# Patient Record
Sex: Male | Born: 1955 | Race: White | Hispanic: No | Marital: Married | Smoking: Never smoker
Health system: Southern US, Community
[De-identification: ages and names within clinical notes are randomized; demographics above are authoritative.]

## PROBLEM LIST (undated history)

## (undated) DIAGNOSIS — N200 Calculus of kidney: Secondary | ICD-10-CM

## (undated) DIAGNOSIS — Z950 Presence of cardiac pacemaker: Secondary | ICD-10-CM

## (undated) DIAGNOSIS — E785 Hyperlipidemia, unspecified: Secondary | ICD-10-CM

## (undated) DIAGNOSIS — I4892 Unspecified atrial flutter: Secondary | ICD-10-CM

## (undated) DIAGNOSIS — I2510 Atherosclerotic heart disease of native coronary artery without angina pectoris: Secondary | ICD-10-CM

## (undated) DIAGNOSIS — K219 Gastro-esophageal reflux disease without esophagitis: Secondary | ICD-10-CM

## (undated) DIAGNOSIS — I4891 Unspecified atrial fibrillation: Secondary | ICD-10-CM

## (undated) DIAGNOSIS — IMO0002 Reserved for concepts with insufficient information to code with codable children: Secondary | ICD-10-CM

## (undated) DIAGNOSIS — D689 Coagulation defect, unspecified: Secondary | ICD-10-CM

## (undated) DIAGNOSIS — F419 Anxiety disorder, unspecified: Secondary | ICD-10-CM

## (undated) DIAGNOSIS — I495 Sick sinus syndrome: Secondary | ICD-10-CM

## (undated) DIAGNOSIS — F329 Major depressive disorder, single episode, unspecified: Secondary | ICD-10-CM

## (undated) DIAGNOSIS — Q670 Congenital facial asymmetry: Secondary | ICD-10-CM

## (undated) DIAGNOSIS — Z955 Presence of coronary angioplasty implant and graft: Secondary | ICD-10-CM

## (undated) DIAGNOSIS — I10 Essential (primary) hypertension: Secondary | ICD-10-CM

## (undated) DIAGNOSIS — R011 Cardiac murmur, unspecified: Secondary | ICD-10-CM

## (undated) DIAGNOSIS — I499 Cardiac arrhythmia, unspecified: Secondary | ICD-10-CM

## (undated) DIAGNOSIS — F32A Depression, unspecified: Secondary | ICD-10-CM

## (undated) HISTORY — PX: CYSTOSCOPY: SUR368

## (undated) HISTORY — DX: Hyperlipidemia, unspecified: E78.5

## (undated) HISTORY — PX: ATRIAL FIBRILLATION ABLATION: SHX5732

## (undated) HISTORY — DX: Unspecified atrial flutter: I48.92

## (undated) HISTORY — DX: Cardiac murmur, unspecified: R01.1

## (undated) HISTORY — DX: Sick sinus syndrome: I49.5

## (undated) HISTORY — DX: Reserved for concepts with insufficient information to code with codable children: IMO0002

---

## 1998-09-26 ENCOUNTER — Ambulatory Visit (HOSPITAL_BASED_OUTPATIENT_CLINIC_OR_DEPARTMENT_OTHER): Admission: RE | Admit: 1998-09-26 | Discharge: 1998-09-26 | Payer: Self-pay

## 1998-10-05 ENCOUNTER — Encounter: Admission: RE | Admit: 1998-10-05 | Discharge: 1999-01-03 | Payer: Self-pay

## 2008-09-17 DIAGNOSIS — Q670 Congenital facial asymmetry: Secondary | ICD-10-CM

## 2008-09-17 HISTORY — DX: Congenital facial asymmetry: Q67.0

## 2008-09-17 HISTORY — PX: CORONARY ARTERY BYPASS GRAFT: SHX141

## 2009-03-11 ENCOUNTER — Inpatient Hospital Stay (HOSPITAL_COMMUNITY): Admission: EM | Admit: 2009-03-11 | Discharge: 2009-03-18 | Payer: Self-pay | Admitting: Emergency Medicine

## 2009-03-11 ENCOUNTER — Ambulatory Visit: Payer: Self-pay | Admitting: Thoracic Surgery (Cardiothoracic Vascular Surgery)

## 2009-03-11 ENCOUNTER — Encounter: Payer: Self-pay | Admitting: Thoracic Surgery (Cardiothoracic Vascular Surgery)

## 2009-03-27 ENCOUNTER — Emergency Department (HOSPITAL_COMMUNITY): Admission: EM | Admit: 2009-03-27 | Discharge: 2009-03-27 | Payer: Self-pay | Admitting: Emergency Medicine

## 2009-04-07 ENCOUNTER — Ambulatory Visit: Payer: Self-pay | Admitting: Thoracic Surgery (Cardiothoracic Vascular Surgery)

## 2009-04-07 ENCOUNTER — Encounter (HOSPITAL_COMMUNITY): Admission: RE | Admit: 2009-04-07 | Discharge: 2009-07-06 | Payer: Self-pay | Admitting: Cardiology

## 2009-04-07 ENCOUNTER — Encounter
Admission: RE | Admit: 2009-04-07 | Discharge: 2009-04-07 | Payer: Self-pay | Admitting: Thoracic Surgery (Cardiothoracic Vascular Surgery)

## 2010-06-02 ENCOUNTER — Encounter: Admission: RE | Admit: 2010-06-02 | Discharge: 2010-06-02 | Payer: Self-pay | Admitting: Cardiology

## 2010-06-06 ENCOUNTER — Ambulatory Visit (HOSPITAL_COMMUNITY): Admission: RE | Admit: 2010-06-06 | Discharge: 2010-06-06 | Payer: Self-pay | Admitting: Cardiology

## 2010-10-18 DIAGNOSIS — I495 Sick sinus syndrome: Secondary | ICD-10-CM

## 2010-10-18 HISTORY — DX: Sick sinus syndrome: I49.5

## 2010-10-18 HISTORY — PX: INSERT / REPLACE / REMOVE PACEMAKER: SUR710

## 2010-11-10 ENCOUNTER — Other Ambulatory Visit: Payer: Self-pay | Admitting: Cardiology

## 2010-11-10 ENCOUNTER — Ambulatory Visit
Admission: RE | Admit: 2010-11-10 | Discharge: 2010-11-10 | Disposition: A | Discharge status: Home / Self Care | Payer: BC Managed Care – PPO | Source: Ambulatory Visit | Attending: Cardiology | Admitting: Cardiology

## 2010-11-15 ENCOUNTER — Ambulatory Visit (HOSPITAL_COMMUNITY)
Admission: RE | Admit: 2010-11-15 | Discharge: 2010-11-16 | Disposition: A | Discharge status: Home / Self Care | Payer: BC Managed Care – PPO | Source: Ambulatory Visit | Attending: Cardiology | Admitting: Cardiology

## 2010-11-15 DIAGNOSIS — I10 Essential (primary) hypertension: Secondary | ICD-10-CM | POA: Insufficient documentation

## 2010-11-15 DIAGNOSIS — I495 Sick sinus syndrome: Secondary | ICD-10-CM | POA: Insufficient documentation

## 2010-11-15 DIAGNOSIS — I2510 Atherosclerotic heart disease of native coronary artery without angina pectoris: Secondary | ICD-10-CM | POA: Insufficient documentation

## 2010-11-15 DIAGNOSIS — R413 Other amnesia: Secondary | ICD-10-CM | POA: Insufficient documentation

## 2010-11-15 DIAGNOSIS — Z951 Presence of aortocoronary bypass graft: Secondary | ICD-10-CM | POA: Insufficient documentation

## 2010-11-15 DIAGNOSIS — E785 Hyperlipidemia, unspecified: Secondary | ICD-10-CM | POA: Insufficient documentation

## 2010-11-15 LAB — APTT: aPTT: 119 seconds — ABNORMAL HIGH (ref 24–37)

## 2010-11-15 LAB — SURGICAL PCR SCREEN: MRSA, PCR: NEGATIVE

## 2010-11-15 LAB — PROTIME-INR: INR: 1.12 (ref 0.00–1.49)

## 2010-11-16 ENCOUNTER — Inpatient Hospital Stay (HOSPITAL_COMMUNITY): Payer: BC Managed Care – PPO

## 2010-11-28 NOTE — Discharge Summary (Signed)
Lucas Porter, Lucas Porter NO.:  [id number]  MEDICAL RECORD NO.:  [id number]           PATIENT TYPE:  O  LOCATION:  3706                         FACILITY:  [facility]  PHYSICIAN:  Ritta Slot, MD     DATE OF BIRTH:  [birth year]  DATE OF ADMISSION:  11/15/2010 DATE OF DISCHARGE:  11/16/2010                              DISCHARGE SUMMARY   DISCHARGE DIAGNOSES: 1. 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     a.     Explantation of loop recorder.     b.     Implantation of a dual-chamber Medtronic Reveal XT      pacemaker. 2. Coronary disease with history of bypass grafting in June 2010. 3. Memory loss. 4. Dyslipidemia.  DISCHARGE CONDITION:  Stable.  DISCHARGE INSTRUCTIONS: 1. See pacemaker instruction sheet. 2. Return to work once Dr. Lynnea Ferrier clears. 3. Low-sodium, heart-healthy diet. 4. Follow up with Dr. Lynnea Ferrier on November 22, 2010, at 9:45 a.m. 5. Coumadin Clinic through [facility] on November 22, 2010, time will be determined of appointment. 6. Begin Coumadin on November 17, 2010.  He will continue same dose.  DISCHARGE MEDICATIONS:  See medication reconciliation sheet.  We did add Vicodin on a p.r.n. basis for site pain in case he has any severe pain.  HOSPITAL COURSE:  Mr. Cuny is a 55 year old gentleman with a history of bypass grafting in 2010 that had a loop recorder placed in 2009 for paroxysmal AFib.  Dr. Lynnea Ferrier saw him again on November 10, 2010.  He has AFib burden of 22% with episodes of pauses at 8 o'clock in the morning and that is on the sotalol 120 mg b.i.d. as well as Cardizem 180.  Clearly, he has tachy-brady syndrome and Dr. Lynnea Ferrier feels to treat the atrial fibrillation when he would need a pacemaker so that he would not be too bradycardic.  The patient had also noted he was forgetful and not remembering to stop at red lights.  Dr. Lynnea Ferrier had instructed him to stop driving for now. He was brought in  electively for the pacemaker implantation which he had done on November 15, 2010, and explantation of the loop recorder, tolerated procedure well.  There were no complications, so the next morning he was stable.  Currently in sinus rhythm.  When his device was interrogated, he did have some atrial fib after his device had been implanted.  His diltiazem has been increased to 240.  He continues on sotalol though he did not receive a dose the night after the procedure.  Chest x-ray reveals heart and lungs normal.  A dual lead permanent cardiac pacer has been placed from the left subclavian venous approach. No pneumothorax.  No acute findings..  The patient has maintained sinus rhythm currently with a rate of 68.  The patient will follow up as instructed.     Darcella Gasman. Annie Paras, N.P.   ______________________________ Ritta Slot, MD    LRI/MEDQ  D:  11/16/2010  T:  11/17/2010  Job:  [id number]  cc:   Clydie Braun L. Hal Hope, M.D.  Electronically Signed by Erlene QuanP.  on 11/17/2010 05:06:09 PM Electronically Signed by Ritta Slot MD on 11/28/2010 01:06:08 PM

## 2010-12-22 LAB — GLUCOSE, CAPILLARY: Glucose-Capillary: 111 mg/dL — ABNORMAL HIGH (ref 70–99)

## 2010-12-24 LAB — DIFFERENTIAL
Basophils Absolute: 0 10*3/uL (ref 0.0–0.1)
Basophils Relative: 1 % (ref 0–1)
Eosinophils Relative: 0 % (ref 0–5)
Lymphocytes Relative: 18 % (ref 12–46)

## 2010-12-24 LAB — CBC
HCT: 35.6 % — ABNORMAL LOW (ref 39.0–52.0)
MCHC: 34.5 g/dL (ref 30.0–36.0)
Platelets: 365 10*3/uL (ref 150–400)
RDW: 14.4 % (ref 11.5–15.5)

## 2010-12-24 LAB — POCT CARDIAC MARKERS: Troponin i, poc: <0.05 ng/mL (ref 0.00–0.09)

## 2010-12-24 LAB — BASIC METABOLIC PANEL
BUN: 18 mg/dL (ref 6–23)
CO2: 25 mEq/L (ref 19–32)
Calcium: 9.5 mg/dL (ref 8.4–10.5)
GFR calc non Af Amer: >60 mL/min (ref >60)
Glucose, Bld: 104 mg/dL — ABNORMAL HIGH (ref 70–99)
Potassium: 4.5 mEq/L (ref 3.5–5.1)

## 2010-12-25 LAB — CBC
HCT: 34.4 % — ABNORMAL LOW (ref 39.0–52.0)
HCT: 38.3 % — ABNORMAL LOW (ref 39.0–52.0)
HCT: 39.3 % (ref 39.0–52.0)
HCT: 42.9 % (ref 39.0–52.0)
Hemoglobin: 11.9 g/dL — ABNORMAL LOW (ref 13.0–17.0)
Hemoglobin: 13.5 g/dL (ref 13.0–17.0)
MCHC: 34.3 g/dL (ref 30.0–36.0)
MCHC: 34.4 g/dL (ref 30.0–36.0)
MCHC: 34.4 g/dL (ref 30.0–36.0)
MCHC: 34.6 g/dL (ref 30.0–36.0)
MCHC: 34.6 g/dL (ref 30.0–36.0)
MCHC: 34.6 g/dL (ref 30.0–36.0)
MCHC: 34.8 g/dL (ref 30.0–36.0)
MCV: 87.9 fL (ref 78.0–100.0)
MCV: 89.1 fL (ref 78.0–100.0)
MCV: 89.1 fL (ref 78.0–100.0)
Platelets: 129 10*3/uL — ABNORMAL LOW (ref 150–400)
Platelets: 132 10*3/uL — ABNORMAL LOW (ref 150–400)
Platelets: 144 10*3/uL — ABNORMAL LOW (ref 150–400)
Platelets: 175 10*3/uL (ref 150–400)
RBC: 3.85 MIL/uL — ABNORMAL LOW (ref 4.22–5.81)
RBC: 4.29 MIL/uL (ref 4.22–5.81)
RBC: 4.41 MIL/uL (ref 4.22–5.81)
RDW: 14.8 % (ref 11.5–15.5)
RDW: 14.8 % (ref 11.5–15.5)
RDW: 14.9 % (ref 11.5–15.5)
RDW: 14.9 % (ref 11.5–15.5)
RDW: 15.2 % (ref 11.5–15.5)

## 2010-12-25 LAB — COMPREHENSIVE METABOLIC PANEL
AST: 31 U/L (ref 0–37)
Albumin: 4 g/dL (ref 3.5–5.2)
Alkaline Phosphatase: 66 U/L (ref 39–117)
BUN: 14 mg/dL (ref 6–23)
BUN: 17 mg/dL (ref 6–23)
CO2: 28 mEq/L (ref 19–32)
Calcium: 8.3 mg/dL — ABNORMAL LOW (ref 8.4–10.5)
Calcium: 8.4 mg/dL (ref 8.4–10.5)
Calcium: 9.4 mg/dL (ref 8.4–10.5)
Creatinine, Ser: 0.81 mg/dL (ref 0.4–1.5)
Creatinine, Ser: 0.82 mg/dL (ref 0.4–1.5)
Creatinine, Ser: 0.83 mg/dL (ref 0.4–1.5)
GFR calc Af Amer: >60 mL/min (ref >60)
GFR calc non Af Amer: >60 mL/min (ref >60)
Glucose, Bld: 103 mg/dL — ABNORMAL HIGH (ref 70–99)
Glucose, Bld: 125 mg/dL — ABNORMAL HIGH (ref 70–99)
Potassium: 3.7 mEq/L (ref 3.5–5.1)
Total Protein: 5.8 g/dL — ABNORMAL LOW (ref 6.0–8.3)
Total Protein: 7.1 g/dL (ref 6.0–8.3)

## 2010-12-25 LAB — GLUCOSE, CAPILLARY
Glucose-Capillary: 79 mg/dL (ref 70–99)
Glucose-Capillary: 88 mg/dL (ref 70–99)
Glucose-Capillary: 97 mg/dL (ref 70–99)

## 2010-12-25 LAB — BASIC METABOLIC PANEL
BUN: 13 mg/dL (ref 6–23)
CO2: 26 mEq/L (ref 19–32)
CO2: 30 mEq/L (ref 19–32)
Calcium: 8.1 mg/dL — ABNORMAL LOW (ref 8.4–10.5)
Calcium: 8.4 mg/dL (ref 8.4–10.5)
Creatinine, Ser: 0.8 mg/dL (ref 0.4–1.5)
GFR calc Af Amer: >60 mL/min (ref >60)
GFR calc non Af Amer: >60 mL/min (ref >60)
GFR calc non Af Amer: >60 mL/min (ref >60)
Glucose, Bld: 120 mg/dL — ABNORMAL HIGH (ref 70–99)
Glucose, Bld: 142 mg/dL — ABNORMAL HIGH (ref 70–99)
Sodium: 134 mEq/L — ABNORMAL LOW (ref 135–145)
Sodium: 134 mEq/L — ABNORMAL LOW (ref 135–145)

## 2010-12-25 LAB — CROSSMATCH
ABO/RH(D): O POS
Antibody Screen: NEGATIVE

## 2010-12-25 LAB — CREATININE, SERUM
Creatinine, Ser: 0.79 mg/dL (ref 0.4–1.5)
GFR calc Af Amer: >60 mL/min (ref >60)

## 2010-12-25 LAB — HEPARIN LEVEL (UNFRACTIONATED)
Heparin Unfractionated: 0.24 IU/mL — ABNORMAL LOW (ref 0.30–0.70)
Heparin Unfractionated: 0.3 IU/mL (ref 0.30–0.70)

## 2010-12-25 LAB — POCT I-STAT 3, ART BLOOD GAS (G3+)
Bicarbonate: 26 mEq/L — ABNORMAL HIGH (ref 20.0–24.0)
O2 Saturation: 91 %
O2 Saturation: 99 %
Patient temperature: 36.7
TCO2: 27 mmol/L (ref 0–100)
pCO2 arterial: 43.3 mmHg (ref 35.0–45.0)
pCO2 arterial: 44.1 mmHg (ref 35.0–45.0)
pH, Arterial: 7.361 (ref 7.350–7.450)
pH, Arterial: 7.364 (ref 7.350–7.450)
pH, Arterial: 7.37 (ref 7.350–7.450)
pO2, Arterial: 308 mmHg — ABNORMAL HIGH (ref 80.0–100.0)
pO2, Arterial: 64 mmHg — ABNORMAL LOW (ref 80.0–100.0)

## 2010-12-25 LAB — POCT I-STAT 4, (NA,K, GLUC, HGB,HCT)
Glucose, Bld: 114 mg/dL — ABNORMAL HIGH (ref 70–99)
HCT: 26 % — ABNORMAL LOW (ref 39.0–52.0)
HCT: 29 % — ABNORMAL LOW (ref 39.0–52.0)
HCT: 35 % — ABNORMAL LOW (ref 39.0–52.0)
HCT: 37 % — ABNORMAL LOW (ref 39.0–52.0)
Hemoglobin: 11.9 g/dL — ABNORMAL LOW (ref 13.0–17.0)
Hemoglobin: 12.6 g/dL — ABNORMAL LOW (ref 13.0–17.0)
Hemoglobin: 8.8 g/dL — ABNORMAL LOW (ref 13.0–17.0)
Hemoglobin: 9.9 g/dL — ABNORMAL LOW (ref 13.0–17.0)
Potassium: 4.1 mEq/L (ref 3.5–5.1)
Potassium: 4.3 mEq/L (ref 3.5–5.1)
Sodium: 137 mEq/L (ref 135–145)
Sodium: 138 mEq/L (ref 135–145)

## 2010-12-25 LAB — POCT I-STAT, CHEM 8
BUN: 10 mg/dL (ref 6–23)
Calcium, Ion: 1.2 mmol/L (ref 1.12–1.32)
Creatinine, Ser: 0.8 mg/dL (ref 0.4–1.5)
Glucose, Bld: 121 mg/dL — ABNORMAL HIGH (ref 70–99)
Hemoglobin: 11.2 g/dL — ABNORMAL LOW (ref 13.0–17.0)
TCO2: 23 mmol/L (ref 0–100)

## 2010-12-25 LAB — CARDIAC PANEL(CRET KIN+CKTOT+MB+TROPI)
Relative Index: INVALID (ref 0.0–2.5)
Relative Index: INVALID (ref 0.0–2.5)
Total CK: 82 U/L (ref 7–232)
Troponin I: 0.01 ng/mL (ref 0.00–0.06)
Troponin I: 0.02 ng/mL (ref 0.00–0.06)

## 2010-12-25 LAB — DIFFERENTIAL
Lymphocytes Relative: 25 % (ref 12–46)
Lymphs Abs: 1.4 10*3/uL (ref 0.7–4.0)
Monocytes Absolute: 0.4 10*3/uL (ref 0.1–1.0)
Monocytes Relative: 7 % (ref 3–12)
Neutro Abs: 3.8 10*3/uL (ref 1.7–7.7)
Neutrophils Relative %: 67 % (ref 43–77)

## 2010-12-25 LAB — POCT CARDIAC MARKERS
CKMB, poc: <1 ng/mL — ABNORMAL LOW (ref 1.0–8.0)
Troponin i, poc: <0.05 ng/mL (ref 0.00–0.09)

## 2010-12-25 LAB — APTT
aPTT: 149 seconds — ABNORMAL HIGH (ref 24–37)
aPTT: 177 seconds — ABNORMAL HIGH (ref 24–37)

## 2010-12-25 LAB — LIPID PANEL
Cholesterol: 119 mg/dL (ref 0–200)
LDL Cholesterol: 64 mg/dL (ref 0–99)
Total CHOL/HDL Ratio: 3.7 RATIO
Triglycerides: 115 mg/dL (ref <150)

## 2010-12-25 LAB — HEMOGLOBIN A1C
Hgb A1c MFr Bld: 5.9 % (ref 4.6–6.1)
Mean Plasma Glucose: 123 mg/dL

## 2010-12-25 LAB — PROTIME-INR
INR: 1 (ref 0.00–1.49)
INR: 1.1 (ref 0.00–1.49)
Prothrombin Time: 12.8 seconds (ref 11.6–15.2)

## 2010-12-25 LAB — HEMOGLOBIN AND HEMATOCRIT, BLOOD
HCT: 25.6 % — ABNORMAL LOW (ref 39.0–52.0)
Hemoglobin: 8.9 g/dL — ABNORMAL LOW (ref 13.0–17.0)

## 2011-01-30 NOTE — Assessment & Plan Note (Signed)
OFFICE VISIT   Lucas Porter, Lucas Porter  DOB:  04-18-1956                                        April 07, 2009  CHART #:  [id number]   REASON FOR VISIT:  Postsurgical followup.   The patient is a 55 year old gentleman, who had coronary artery bypass  grafting x4 on March 14, 2009.  He presented with crescendo angina.  He  had severe two-vessel disease in a left dominant circulation.  We did 4  grafts including a left mammary and a left radial as well as two  saphenous vein grafts.  Postoperatively he did well.  He did not have  any significant complications and since the time of discharge, he has  continued to do well.  He still has some soreness.  He did need a refill  on his pain medication.  He is usually taking one when he gets up in the  morning, occasionally takes 1 tablet before he goes to bed at night.  He  has started to sleep on side and he is a little more uncomfortable since  then.  He does occasionally feel a clicking and popping in around the  sternum, but otherwise just soreness.  He has not had any anginal-type  pain since discharge.  He says he is walking about 3-4 times a day,  usually 20 minutes at a time.  He is concerned about returning to work.  His work involves heavy physical labor.   His current medications are Effexor 150 mg daily, lisinopril 20 mg  daily, pravastatin 40 mg daily, Ambien 5 mg nightly, fish oil 4000 mg  b.i.d., aspirin 81 mg daily, Toprol-XL has been increased to 50 mg  daily, Imdur 30 mg daily which he will discontinue after his current  prescription is completed.   He has no known drug allergies.   PHYSICAL EXAMINATION:  GENERAL:  The patient is a 55 year old male in no  acute distress.  VITAL SIGNS:  Blood pressure is 126/81, pulse 68, respirations are 18,  and his oxygen saturation is 97% on room air.  NEUROLOGIC:  He is alert and oriented x3 with no deficits.  CHEST:  Sternal incision is clean, dry, and intact.   Sternum is stable.  CARDIAC:  Regular rate and rhythm.  Normal S1 and S2.  No rubs, murmurs,  or gallops.  LUNGS:  Clear with equal breath sounds bilaterally.  EXTREMITIES:  His radial incision has healed well as has his leg  incision.  He has no peripheral edema.   Chest x-ray shows a tiny clinically insignificant left pleural effusion.   IMPRESSION:  The patient is a 55 year old gentleman.  He is status post  coronary artery bypass grafting.  He is about a month out from surgery  at this point.  He is still not to lift any heavy objects anything over  10 pounds for the next 2-3 weeks.  He may begin driving.  Appropriate  precautions were discussed.  He is going to start cardiac rehab next  week.  He has a remote history of tobacco abuse, but none recently.  I  do think he should be able to return to full activities, but certainly  should not try to return to work before it has been 3 months since his  operation which would basically be June 17, 2009.  Salvatore Decent Dorris Fetch, M.D.  Electronically Signed   SCH/MEDQ  D:  04/07/2009  T:  04/08/2009  Job:  [id number]   cc:   Ritta Slot, MD  Dr. Hal Hope

## 2011-01-30 NOTE — Consult Note (Signed)
NAMEANSLEY, STANWOOD NO.:  [id number]   MEDICAL RECORD NO.:  [id number]          PATIENT TYPE:  INP   LOCATION:  2912                         FACILITY:  [facility]   PHYSICIAN:  Salvatore Decent. Dorris Fetch, M.D.DATE OF BIRTH:  [birth year]   DATE OF CONSULTATION:  03/11/2009  DATE OF DISCHARGE:                                 CONSULTATION   REASON FOR CONSULTATION:  Left main and severe three-vessel coronary  disease.   HISTORY OF PRESENT ILLNESS:  Mr. Pflaum is a 55 year old gentleman with  multiple cardiac risk factors including hypertension,  hypercholesterolemia, and a strong family history of coronary disease.  He has had no previous cardiac problems or symptoms.  Over the past 2-3  weeks, he has noted progressive exertional chest discomfort.  He says  when he walks to his barn, it is up on hill, and when he gets there he  will have tightness in his chest relieved by rest.  Over the last few  days, this pain has become more marked in severity, duration, and takes  longer to resolve with rest.  He was seen in consultation by Dr. Ritta Slot and his story was so concerning that he admitted him to [facility]  today and he underwent cardiac catheterization by Dr. Julien Nordmann  which showed 60% left main stenosis and 95% ostial LAD stenosis.  There  was complex anatomy at the takeoff of the LAD with the LAD, diagonal,  and ramus which bifurcates all originating in close proximity.  There is  also a 70-80% stenosis in his posterior descending branch of the left  circumflex.  The LV function was normal.  The patient currently is pain-  free and he has not had any rest or nocturnal pain.   PAST MEDICAL HISTORY:  1. Hypertension.  2. Hypercholesterolemia.  3. Nephrolithiasis.  4. Remote light tobacco use.  5. Insulin resistance.   MEDICATIONS ON ADMISSION:  1. Lisinopril and hydrochlorothiazide 20/12.5 one tablet daily.  2. Fluticasone 50 mcg inhaled.  3. Zolpidem 5 mg  p.o. at bedtime p.r.n.  4. Pravastatin 40 mg p.o. daily.  5. Effexor 150 mg p.o. daily.   ALLERGIES:  He has no known drug allergies.   FAMILY HISTORY:  Significant for father having an MI at 11 and a brother  had a stroke at age 69.   SOCIAL HISTORY:  He lives with his wife.  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  He smoked pipe greater than 25 years ago.  He is married  with two  children and four grandchildren.   REVIEW OF SYSTEMS:  Negative other than the symptoms noted in HPI.  All  other systems are negative.   PHYSICAL EXAMINATION:  VITAL SIGNS:  Mr. [name] is 5 feet 8 inches tall  and 224 pounds.  His blood pressure is 114/70, pulse is 78 and regular,  and respirations are 16.  GENERAL:  He is well-developed, well-nourished, and in no acute  distress.  NEUROLOGIC:  He is alert and oriented x3 with no focal deficits.  HEENT:  Unremarkable.  NECK:  Supple without thyromegaly, adenopathy, or bruits.  CARDIAC:  Regular rate and rhythm.  Normal S1-S2.  No murmurs, rubs, or  gallops.  LUNGS:  Clear anteriorly.  ABDOMEN:  Soft and nontender.  EXTREMITIES:  Without clubbing, cyanosis, or edema.  He has 2+ pulses  throughout.  He has a normal Allen test on the left side.   LABORATORY DATA:  His cholesterol is 119, HDL 32, and LDL 64.  Sodium  139, potassium 3.7, BUN and creatinine are 14 and 0.82.  PT 12.8, PTT  149 on heparin.  Cardiac enzymes are negative.  White count is 5.7,  platelets 175, and hematocrit 43.  EKG shows normal sinus rhythm with no  ischemic changes.   IMPRESSION:  Mr. Bifulco is a 55 year old gentleman with multiple cardiac  risk factors who presents with crescendo exertional angina.  At  catheterization, he has tight ostial left anterior descending disease as  well as hemodynamically significant distal left main disease.  He also  has significant disease in the diagonal and bifurcating ramus as well as  posterior descending branch from the left  circumflex.  Coronary bypass  grafting is indicated for both survival benefit and relief of symptoms.   I had a long discussion with Mr. Kasler and his wife regarding the  indications, risks, benefits, and alternatives.  They understand the  general nature of the procedure, need for general anesthesia, incisions  to be used, rationalization previously to the left radial artery,  expected hospital stay, and overall recovery.  He understands the risks  that include but not limited to death, stroke, myocardial infarction,  deep venous thrombosis, pulmonary embolism, bleeding, possible need for  transfusions, infections as well as other organ system dysfunction  including respiratory, renal, hepatic, or gastrointestinal  complications.  He understands and accepts these risks and agrees to  proceed.  As he has had only exertional symptoms at this point, he will  be scheduled for first case Monday morning where he developed any  unstable symptoms over the weekend, he may needed to be done more  urgently than that.  All of the patient's questions were answered.      Salvatore Decent Dorris Fetch, M.D.  Electronically Signed     SCH/MEDQ  D:  03/11/2009  T:  03/12/2009  Job:  [id number]   cc:   Ritta Slot, MD  Marcos Eke. Hal Hope, M.D.

## 2011-01-30 NOTE — H&P (Signed)
NAMEKERI, TAVELLA NO.:  [id number]   MEDICAL RECORD NO.:  [id number]           PATIENT TYPE:   LOCATION:                                 FACILITY:   PHYSICIAN:  Ritta Slot, MD     DATE OF BIRTH:  [birth year]   DATE OF ADMISSION:  DATE OF DISCHARGE:                              HISTORY & PHYSICAL   ADDRESS:  Clydie Braun L. Hal Hope, M.D.  [place] [address] [place], [place] [address]   BODY:  Dear Clydie Braun:   Thanks for referring this very pleasant 55 year old Caucasian gentleman  with an excellent story for crescendo angina.  He has been having  symptoms of chest pain on exertion for the past 2 weeks that is becoming  more frequent and more sustained, now taking at least 30 minutes to  resolve at rest.  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  His pain does not radiate anywhere  else.  He denies any chest pain at rest.  He denies nausea or vomiting.  He has also had pain yesterday that lasted 30 minutes during rest time.  The pain does not come on at rest.  If he were to exert himself or start  walking up a hill, he would get worsening angina.  His risk factors for  coronary disease include being an active smoker, hypertension, mild  dyslipidemia and insulin resistance.   PAST MEDICAL HISTORY:  Otherwise unremarkable.   His current medications include the following:  1. Lisinopril/hydrochlorothiazide 20/12.5 mg daily.  2. Fluticasone 50 mcg inhaled.  3. Zolpidem 5 mg p.r.n. q.h.s.  4. Pravastatin 40 mg daily.  5. Effexor 150 mg daily.   ALLERGIES:  No known drug allergies.   SOCIAL HISTORY:  He quit smoking 25 years ago.  He is married with 2  grown children and 4 grandchildren.  He works as a Designer, jewellery.   REVIEW OF SYSTEMS:  Otherwise unremarkable.   FAMILY HISTORY:  His brother died of a stroke at age of 57.  His parents  both  have hypertension and diabetes.   On examination, a well-looking individual not in acute distress.  He is 5 feet 10 inches, weighs 224 pounds.  Blood pressure is 140/100,  his heart rate 64 sinus rhythm.  Examination of the HEENT:  Head is atraumatic, normocephalic.  NECK:  Supple, full range of movement.  No jugular venous distention,  carotid bruit or thyromegaly.  Cranial nerves II-XII normal.  PERRLA.  No focal deficits.  MUSCULOSKELETAL SYSTEM:  Normal, no focal deficits.  CARDIOVASCULAR EXAM:  No heaves or thrills.  Heart sounds 1 and 2 heard.  No murmurs, rubs or gallops.  LUNGS:  Clear to auscultation bilaterally to percussion . No creps or  wheezes noted.  ABDOMEN:  Soft, nontender.  Bowel sounds present.  No AAA noted.  No  hepatosplenomegaly noted.  No renal bruits heard.  EXTREMITIES:  Pedal pulses 2+, no edema.   A 12-lead  ECG:  Normal sinus rhythm, no acute ST-T-wave changes.  He  does have suggestion of ST elevation inferiorly.   IMPRESSION:  A very pleasant 55 year old gentleman who has symptoms  highly suspicious for coronary artery disease with crescendo angina.   PLAN:  I am admitting him to the [facility] emergency room ER, where he will  have a cardiac catheterization straightaway today by my partner, Dr. Dossie Arbour.  Should he require intervention, that will be performed today  urgently.   Many thanks for referring him to [place].   Yours sincerely,      Ritta Slot, MD  Electronically Signed     HS/MEDQ  D:  03/11/2009  T:  03/11/2009  Job:  [id number]

## 2011-01-30 NOTE — Cardiovascular Report (Signed)
Lucas Porter, KUCHER NO.:  [id number]   MEDICAL RECORD NO.:  [id number]          PATIENT TYPE:  INP   LOCATION:  2807                         FACILITY:  [facility]   PHYSICIAN:  Antonieta Iba, MD   DATE OF BIRTH:  06-Feb-1956   DATE OF PROCEDURE:  DATE OF DISCHARGE:                            CARDIAC CATHETERIZATION   PROCEDURE:  Cardiac catheterization.   PHYSICIAN PERFORMING THE PROCEDURES:  Julien Nordmann, M.D.   REASON FOR PROCEDURE:  Lucas Porter is a very pleasant 55 year old  gentleman with obesity, hypertension and hyperlipidemia who presented to  the emergency room after being evaluated by Dr. Ritta Slot and  previously too by urgent care for several weeks of stuttering and  worsening chest pain.  This morning he had chest pain radiating to his  left arm.  The chest pain has been lingering for longer periods of time.  Given his classic anginal symptoms,  I was contacted by Dr. Fredrich Birks for  a cardiac catheterization and he was brought to directly to the  catheterization lab.  An EKG showed no significant ST-T wave changes.   PROCEDURES THE DETAIL:  Risks and benefits were discussed with the  patient and consent was obtained.  The patient was brought to the  Catheterization Lab and prepped and draped in the usual sterile fashion.  The modified Seldinger technique was used to engage the right femoral  artery and a 5-French introducer sheath was inserted.  A 5-French JL-4  and JR-4 were used to engage the left main and the right coronary  arteries respectively.  Hand injection of contrast was used with  cinematography to record the coronary anatomy.  A 5-French pigtail  catheter was used to cross the aortic valve and LV gram was recorded.  The JR-4 catheter was used to engage the left subclavian and a  nonselective LIMA shot was obtained.  The pigtail catheter was also used  to record distal aorta and iliac arterial runoff.  The catheter was  removed at  the end of the case including the introducer sheath and  manual pressure held and hemostasis obtained.  No complications were  reported at the time of this procedure.   Coronary anatomy;   Left main; left main is a moderate-to-large size vessel that trifurcates  into the LAD, ramus vessel and circumflex.  There is moderate distal  left circumflex disease that extends into the LAD, ostial diagonal and  ostial ramus vessel.  Left main disease estimated at 60%.   Left anterior descending; the LAD has severe ostial disease estimated at  95 plus percent.  There is also 70% proximal LAD disease.  There is a  diagonal branch that takes off from the ostial region of the LAD that  has approximately 70% ostial disease.   Left circumflex; left circumflex has a 70-80% distal limb disease,  otherwise mild luminal irregularities.   Ramus/high OM; the ramus vessel is bifurcating at its infarction and has  moderately severe ostial disease of both branches estimated at 70%. This  vessel is moderate in size.   Right coronary artery; nondominant coronary vessel  with mild proximal  disease estimated at 40-50%, otherwise no significant disease noted.   Ejection fraction estimated at 55% with no focal wall motion  abnormalities.  No aortic stenosis or mitral regurgitation noted.   The LIMA is patent on nonselective imaging.  Distal runoff and  evaluation of the distal aorta and iliac artery shows no severe  stenoses.   FINAL IMPRESSION:  Severe ostial left anterior descending disease with  moderately severe proximal left anterior descending, ostial diagonal,  ostial ramus disease and distal left main disease as well as distal  obtuse marginal disease.  There is mild proximal right coronary artery  disease.  The images were shown to the patient and as well as to Dr.  Gery Pray.  Given the extent of his disease and complicated disease at the  distal left main, we will consult Cardiothoracic Surgery for  further  evaluation and assistance with his management.  He may require bypass  surgery given the complexity of this region.  We will treat him  medically with heparin starting in several hours' time as well as a  nitroglycerin drip/infusion.  He will also be started on a cholesterol  medication.      Antonieta Iba, MD  Electronically Signed     TJG/MEDQ  D:  03/11/2009  T:  03/11/2009  Job:  [id number]

## 2011-01-30 NOTE — Discharge Summary (Signed)
NAMEBEVIN, Porter NO.:  [id number]   MEDICAL RECORD NO.:  [id number]          PATIENT TYPE:  INP   LOCATION:  2029                         FACILITY:  [facility]   PHYSICIAN:  Salvatore Decent. Dorris Fetch, M.D.DATE OF BIRTH:  1956/05/10   DATE OF ADMISSION:  03/11/2009  DATE OF DISCHARGE:                               DISCHARGE SUMMARY   FINAL DIAGNOSIS:  Severe two-vessel coronary artery disease with  crescendo angina.   IN-HOSPITAL DIAGNOSES:  1. Volume overload postoperatively.  2. Acute blood loss anemia postoperatively.   SECONDARY DIAGNOSES:  1. Hypertension.  2. Hypercholesterolemia.  3. Itching/pruritus.  4. Remote tobacco use.  5. Insulin resistant.   IN-HOSPITAL OPERATIONS AND PROCEDURES:  1. Cardiac catheterization.  2. Coronary artery bypass grafting x4 using a left internal mammary      artery to left anterior descending, left radial artery to left      posterior descending, saphenous vein graft to first diagonal,      saphenous vein graft to first branch to ramus intermedius.      Endoscopic vein harvest from the right thigh with open left radial      artery harvest.   HISTORY AND PHYSICAL AND HOSPITAL COURSE:  Mr. Lucas Porter is a 55 year old  gentleman who presented with progressive exertional angina.  This had  become crescendo over several days leading up to admission.  He was  admitted on an urgent basis and underwent cardiac catheterization on  March 11, 2009 where he was found to have a 99% ostial LAD stenosis as  well as 99% stenosis in the large first diagonal and bifurcating ramus  immediate branch.  He also had 80% stenosis in the posterior descending  branch of the circumflex and left circumflex.  This was a left dominant  circulation.  The right coronary artery was small and nondominant.  The  patient was advised to undergo coronary artery bypass grafting.  Dr.  Dorris Fetch was consulted.  Dr. Dorris Fetch saw and evaluated the  patient.  He  discussed with the patient undergoing coronary artery  bypass grafting.  He discussed risks and benefits.  The patient also  understanding agreed to proceed.  Surgery was scheduled for March 14, 2009.  Preoperatively, I believe the patient had bilateral carotid  duplex ultrasound showing no significant ICA stenosis.  He also had  preoperative ABIs which were normal.  The patient remained stable  preoperatively.  For further details of the patient's past medical  history and physical exam, please see dictated H and P.   The patient was taken to the operating room on March 14, 2009 where he  underwent coronary artery bypass grafting x4 using a left internal  mammary artery to left anterior descending, left radial artery to left  posterior descending, saphenous vein graft to first diagonal, saphenous  vein graft to first branch of ramus intermedius, and open left radial  artery harvest.  Endoscopic vein harvest from the right thigh.  The  patient tolerated this procedure well and was transferred to the  Intensive Care Unit in stable condition.  Postoperatively, the patient  was  noted to be hemodynamically stable.  He was extubated on the evening  of surgery.  Post-extubation, the patient noted to be alert and oriented  x4.  Neuro intact.  Postoperatively, the patient noted to be in normal  sinus rhythm.  Blood pressure was stable.  All drips were weaned and  discontinued.  The patient was started on low-dose beta-blocker.  He  remained in normal sinus rhythm.  Over the next several days, his blood  pressure did start to trend up and he was restarted on lisinopril.  Blood pressure tolerated well and improving.  Postoperatively, a chest x-  ray was obtained which was stable.  The patient had minimal drainage  from chest tubes and chest tubes were discontinued in normal fashion.  A  followup chest x-ray remained stable with no acute findings.  He was  encouraged to use his incentive spirometer.   During this time, the  patient was able to be weaned off oxygen with O2 sats greater than 90%.  Postoperatively, the patient did have some mild volume overload.  He was  started on diuretics.  Daily weights were obtained.  He currently  remained 3.2 kg above his preoperative weight.  The patient did have  some mild acute blood loss anemia.  Hemoglobin and hematocrit were 10.6  and 30.6 on postop day #2.  No transfusions were required.  The patient  was asymptomatic.  The patient was progressing well and was transferred  out of the Intensive Care Unit to PCT postop day #1.  He was working  well with cardiac rehab.  He was up and ambulating without difficulty.  Vital signs were monitored.  The patient remained afebrile.  Again, he  remained in normal sinus rhythm.  He was tolerating diet well.  No  nausea or vomiting noted.  All incisions were noted to be clean, dry,  and intact and healing well.   On postop day #3, March 17, 2009, the patient was noted to be progressing  quite well.  His most recent lab work shows sodium of 134, potassium of  4.6, chloride of 100, bicarbonate of 30, BUN of 18, creatinine of 0.8,  and glucose of 120.  White blood cell count of 8.1, hemoglobin 10.6,  hematocrit 30.6, and platelet count 112.  The patient is tentatively  ready for discharge home in the next 24-48 hours pending he remained  stable.   FOLLOWUP APPOINTMENTS:  A followup appointment will be arranged with Dr.  Dorris Fetch in 3 weeks.  Our office will contact the patient with this  information.  The patient will need to obtain PMI chest x-ray 30 minutes  prior to this appointment.  The patient will need to follow up with Dr.  Lynnea Ferrier in 2 weeks.  He will need to contact Dr. Donavan Burnet office to  schedule this appointment.   ACTIVITY:  The patient instructed no driving until released to do so and  no lifting over 10 pounds.  He is told to ambulate 3-4 times per day,  progress as tolerated, and  continue his breathing exercises.   INCISIONAL CARE:  The patient is told to shower washing his incisions  using soap and water.  He is to contact the office if he develops any  drainage or opening from any of his incision sites.   DIET:  The patient educated on diet to be low-fat, low-salt.   DISCHARGE MEDICATIONS:  1. Effexor 150 mg daily.  2. Lisinopril 20 mg daily.  3. Pravastatin  40 mg daily.  4. Ambien 5 mg at night.  5. Fluticasone spray as needed.  6. Imdur 30 mg daily.  7. Toprol-XL 25 mg daily.  8. Lasix 40 mg daily x5 days.  9. Potassium chloride 20 mEq daily x5 days.  10.Oxycodone 5 mg one to tabs q.4-6 h. p.r.n. pain.      Sol Blazing, PA      Salvatore Decent. Dorris Fetch, M.D.  Electronically Signed    KMD/MEDQ  D:  03/17/2009  T:  03/18/2009  Job:  [id number]   cc:   Ritta Slot, MD

## 2011-01-30 NOTE — Op Note (Signed)
NAMEJORAN, Porter NO.:  [id number]   MEDICAL RECORD NO.:  [id number]          PATIENT TYPE:  INP   LOCATION:  2303                         FACILITY:  [facility]   PHYSICIAN:  Salvatore Decent. Dorris Fetch, M.D.DATE OF BIRTH:  [birth year]   DATE OF PROCEDURE:  03/14/2009  DATE OF DISCHARGE:                               OPERATIVE REPORT   PREOPERATIVE DIAGNOSIS:  Severe two-vessel coronary artery disease with  crescendo angina.   POSTOPERATIVE DIAGNOSIS:  Severe two-vessel coronary artery disease with  crescendo angina.   PROCEDURES:  Median sternotomy, extracorporeal circulation, coronary  artery bypass grafting x4 (left internal mammary artery to left anterior  descending, left radial artery to left posterior descending, saphenous  vein graft to first diagonal, saphenous vein graft to first branch of  ramus intermedius), open left radial artery harvest, endoscopic right  vein harvest from right thigh.   SURGEON:  Salvatore Decent. Dorris Fetch, MD   ASSISTANT:  Coral Ceo, PA   ANESTHESIA:  General.   FINDINGS:  Good-quality targets, good-quality conduits, and  cardiomegaly.   CLINICAL NOTE:  Mr. Lucas Porter is a 55 year old gentleman, who presented  with progressive exertional angina.  This had become crescendo over  several days leading up to admission.  He was admitted on an urgent  basis and underwent cardiac catheterization on March 11, 2009, where he  was found to have a 99% ostial LAD stenosis as well as 99% stenoses  figuring the large first diagonal and a bifurcating ramus intermedius  branch.  He also had an 80% stenosis in the posterior descending branch  of the circumflex and left dominant circulation.  The right coronary was  small and nondominant.  The patient was advised to undergo coronary  artery bypass grafting.  The indications, risks, benefits, and  alternatives were discussed in detail with the patient.  He understood  the risks as outlined in the  consultation note, accepted them, and  agreed to proceed.   OPERATIVE NOTE:  Mr. Lucas Porter was brought to the preop holding area on  March 14, 2009.  The lines were placed by Anesthesia for arterial blood  pressure monitoring as well as Swan-Ganz catheter placement.  Intravenous antibiotics were administered.  The patient was taken to the  operating room, anesthetized, and intubated.  A Foley catheter was  placed.  The chest, abdomen, legs, and the left arm were prepped and  draped in the usual sterile fashion.  The preoperative Freida Busman test was  confirmed with pulse oximetry and plethysmography in the preop holding  area.   An incision was made over the volar aspect of the left wrist.  A small  incision was made initially and the distal portion of the left radial  artery was identified.  A short segment was dissected out with a  harmonic scalpel.  There was an excellent distal pulse with proximal  occlusion.  The incision then was extended to just below the antecubital  fossa.  The vessel was harvested using standard technique using a  harmonic scalpel.  Simultaneously, a median sternotomy was performed,  and the left internal mammary artery was  harvested using standard  technique, 2000 units of heparin was administered prior to dividing the  distal end of the left mammary artery.  There was good flow through the  cut into the vessel.  Mammary was placed in a papaverine-soaked sponge  until later utilized as a graft.  While the radial incision was being  closed in 2 layers, an incision was made in the medial aspect of the  right leg at the level of the knee.  The greater saphenous vein was  identified and was harvested from the right thigh endoscopically.  All 3  vessels were good-quality conduits.   Remainder of the full heparin dose was administered.  The pericardium  was opened.  The ascending aorta was inspected and was free of  atherosclerotic disease.  It was of normal size, and the  aorta was  cannulated via concentric 2-0 Ethibond pledgeted purse-string sutures.  A dual-stage venous cannula was placed via purse-string suture in the  right atrial appendage.  After confirming adequate anticoagulation with  ACT measurement, cardiopulmonary bypass was instituted and the patient  was cooled to 32 degrees Celsius.  Flows were maintained per protocol.  The patient was cooled to 32 degrees Celsius.  The coronary arteries  were inspected and anastomotic sites were chosen.  The conduits were  inspected and cut to length.  A foam pad was placed in the pericardium  to insulate the heart in particular the left phrenic nerve.  A  temperature probe was placed in myocardial septum and a cardioplegic  cannula placed in the ascending aorta.   The aorta was cross-clamped.  The left ventricle was emptied via the  aortic root vent.  Cardiac arresting was achieved with a combination of  cold antegrade blood cardioplegia and topical ice saline.  Cardioplegia  1.5 L was administered.  The myocardial septal temperature fell to 12  degrees Celsius and subsequently fell to 9 degrees Celsius.  There was a  rapid diastolic arrest.  The following distal anastomoses were  performed.   First, a reversed saphenous vein graft was placed end-to-side to the  first of the 2 branches in the bifurcated ramus intermedius.  This was a  1.5-mm vessel.  The angle at which the other vessel came out was too  small to graft at the place where it was accessible.  The ramus that was  grafted was a good-quality target.  The vein graft was good quality.  The anastomosis was performed with a running 7-0 Prolene suture.  The  cardioplegia was administered.  There was good flow and good hemostasis.   Next, a reverse saphenous vein graft was placed end-to-side to the first  diagonal branch of the LAD.  This was a high branch essentially optional  diagonal, it was a 1.5-mm good-quality target.  The anastomosis was   performed with a running 7-0 Prolene suture.  The vein graft was a good  quality.  There was again excellent flow and good hemostasis at this  anastomosis.   Additional cardioplegia was administered down the aortic root at this  point, the posterior descending coronary artery then was exposed.  It  was 1.5-mm good-quality target.  The distal end of the left radial  artery was beveled and was anastomosed end-to-side with a running 8-0  Prolene suture.  Next, the left internal mammary artery was brought  through a window in the pericardium.  The distal end was beveled and  then anastomosed end-to-side to the distal LAD.  This  was a 2-mm good-  quality target.  The mammary artery was a 2-mm good-quality conduit.  The anastomosis was performed with a running 8-0 Prolene suture.  At the  completion of the mammary to LAD anastomosis, the bulldog clamp was  briefly removed and inspected for hemostasis.  Immediate and rapid  septal rewarming was noted.  The bulldog clamp was replaced.  The  mammary pedicle was tacked to the epicardial surface of the heart with 6-  0 Prolene sutures.   Additional cardioplegia was administered.  The cardioplegic cannula then  was removed from the ascending aorta.  The proximal, radial, and vein  graft anastomoses were performed to 4.0-mm punch aortotomies with  running 6-0 Prolene sutures.  At the completion of the final proximal  anastomosis, the patient was placed in Trendelenburg position.  Lidocaine was administered.  The aortic root was de-aired and the aortic  cross-clamp was removed.  The total cross-clamp time was 73 minutes.  The patient required a single defibrillation with 10 J and then was in  sinus bradycardia thereafter.  While the patient was being rewarmed, all  proximal and distal anastomoses were inspected for hemostasis.  Epicardial pacing wires were placed on the right ventricle and right  atrium, and the patient had rewarmed to a core  temperature of 37 degrees  Celsius.  He was weaned for cardiopulmonary bypass on the first attempt  without inotropic support.  Total bypass time was 103 minutes.  The  initial cardiac index was greater than 2 L/min/m2.  The patient remained  hemodynamically stable with the exception of a transient drop in blood  pressure during protamine administration, which responded immediately to  volume administration and Neo-Synephrine.  Test dose of protamine was  administered with brief hypertensive response as described.  The atrial  and aortic cannulae were removed.  The remainder of the protamine was  administered without additional incident.  The chest was irrigated with  warm saline.  Hemostasis was achieved.  The pericardium was  reapproximated with interrupted 3-0 silk sutures.  It came together  easily without tension or kinking of the underlying grafts.  The left  pleural and mediastinal chest tubes were placed through separate  subcostal incisions.  The sternum was closed with interrupted single and  double heavy gauge stainless steel wires.  The pectoralis fascia,  subcutaneous tissue, and skin were closed in standard fashion.  All  sponge, needle, and sponge counts were correct at the end of the  procedure.  The patient tolerated the procedure well and was taken from  the operating room to the Surgical Intensive Care Unit in fair  condition.       Salvatore Decent Dorris Fetch, M.D.  Electronically Signed     SCH/MEDQ  D:  03/14/2009  T:  03/15/2009  Job:  [id number]   cc:   Antonieta Iba, MD  Ritta Slot, MD  Marcos Eke. Hal Hope, M.D.

## 2011-03-23 ENCOUNTER — Encounter: Payer: Self-pay | Admitting: Internal Medicine

## 2011-03-23 HISTORY — PX: NM MYOCAR PERF WALL MOTION: HXRAD629

## 2011-06-20 ENCOUNTER — Emergency Department (HOSPITAL_COMMUNITY): Payer: BC Managed Care – PPO

## 2011-06-20 ENCOUNTER — Inpatient Hospital Stay (HOSPITAL_COMMUNITY)
Admission: EM | Admit: 2011-06-20 | Discharge: 2011-06-25 | DRG: 143 | Disposition: A | Discharge status: Home / Self Care | Payer: BC Managed Care – PPO | Attending: Internal Medicine | Admitting: Internal Medicine

## 2011-06-20 DIAGNOSIS — X58XXXA Exposure to other specified factors, initial encounter: Secondary | ICD-10-CM

## 2011-06-20 DIAGNOSIS — S01502A Unspecified open wound of oral cavity, initial encounter: Secondary | ICD-10-CM | POA: Diagnosis present

## 2011-06-20 DIAGNOSIS — Z951 Presence of aortocoronary bypass graft: Secondary | ICD-10-CM

## 2011-06-20 DIAGNOSIS — F329 Major depressive disorder, single episode, unspecified: Secondary | ICD-10-CM | POA: Diagnosis present

## 2011-06-20 DIAGNOSIS — Z950 Presence of cardiac pacemaker: Secondary | ICD-10-CM

## 2011-06-20 DIAGNOSIS — I495 Sick sinus syndrome: Secondary | ICD-10-CM | POA: Diagnosis present

## 2011-06-20 DIAGNOSIS — E785 Hyperlipidemia, unspecified: Secondary | ICD-10-CM | POA: Diagnosis present

## 2011-06-20 DIAGNOSIS — I4891 Unspecified atrial fibrillation: Secondary | ICD-10-CM | POA: Diagnosis present

## 2011-06-20 DIAGNOSIS — R0789 Other chest pain: Principal | ICD-10-CM | POA: Diagnosis present

## 2011-06-20 DIAGNOSIS — I10 Essential (primary) hypertension: Secondary | ICD-10-CM | POA: Diagnosis present

## 2011-06-20 DIAGNOSIS — I2510 Atherosclerotic heart disease of native coronary artery without angina pectoris: Secondary | ICD-10-CM | POA: Diagnosis present

## 2011-06-20 DIAGNOSIS — F3289 Other specified depressive episodes: Secondary | ICD-10-CM | POA: Diagnosis present

## 2011-06-20 LAB — DIFFERENTIAL
Basophils Absolute: 0 10*3/uL (ref 0.0–0.1)
Basophils Relative: 0 % (ref 0–1)
Lymphocytes Relative: 25 % (ref 12–46)
Neutro Abs: 5.9 10*3/uL (ref 1.7–7.7)
Neutrophils Relative %: 69 % (ref 43–77)

## 2011-06-20 LAB — PROTIME-INR
INR: 3.46 — ABNORMAL HIGH (ref 0.00–1.49)
Prothrombin Time: 35.3 seconds — ABNORMAL HIGH (ref 11.6–15.2)

## 2011-06-20 LAB — BASIC METABOLIC PANEL
Calcium: 9.1 mg/dL (ref 8.4–10.5)
GFR calc Af Amer: >90 mL/min (ref >90)
GFR calc non Af Amer: >90 mL/min (ref >90)
Glucose, Bld: 99 mg/dL (ref 70–99)
Sodium: 141 mEq/L (ref 135–145)

## 2011-06-20 LAB — POCT I-STAT TROPONIN I
Troponin i, poc: 0.01 ng/mL (ref 0.00–0.08)
Troponin i, poc: 0.01 ng/mL (ref 0.00–0.08)

## 2011-06-20 LAB — CBC
HCT: 41.8 % (ref 39.0–52.0)
Hemoglobin: 14.7 g/dL (ref 13.0–17.0)
RBC: 4.78 MIL/uL (ref 4.22–5.81)
WBC: 8.6 10*3/uL (ref 4.0–10.5)

## 2011-06-21 ENCOUNTER — Inpatient Hospital Stay (HOSPITAL_COMMUNITY): Payer: BC Managed Care – PPO

## 2011-06-21 LAB — CK TOTAL AND CKMB (NOT AT ARMC)
CK, MB: 3.7 ng/mL (ref 0.3–4.0)
Relative Index: 2.6 — ABNORMAL HIGH (ref 0.0–2.5)
Relative Index: 2.6 — ABNORMAL HIGH (ref 0.0–2.5)
Total CK: 157 U/L (ref 7–232)

## 2011-06-21 LAB — BASIC METABOLIC PANEL
BUN: 11 mg/dL (ref 6–23)
Creatinine, Ser: 0.72 mg/dL (ref 0.50–1.35)
GFR calc Af Amer: >90 mL/min (ref >90)
GFR calc non Af Amer: >90 mL/min (ref >90)
Glucose, Bld: 107 mg/dL — ABNORMAL HIGH (ref 70–99)

## 2011-06-21 LAB — LIPID PANEL
Cholesterol: 148 mg/dL (ref 0–200)
HDL: 33 mg/dL — ABNORMAL LOW (ref >39)
Total CHOL/HDL Ratio: 4.5 RATIO
Triglycerides: 194 mg/dL — ABNORMAL HIGH (ref <150)

## 2011-06-21 LAB — TROPONIN I: Troponin I: <0.3 ng/mL (ref <0.30)

## 2011-06-21 LAB — HEPATIC FUNCTION PANEL
ALT: 23 U/L (ref 0–53)
AST: 18 U/L (ref 0–37)
Albumin: 3.8 g/dL (ref 3.5–5.2)
Total Protein: 6.4 g/dL (ref 6.0–8.3)

## 2011-06-21 LAB — PROTIME-INR
INR: 2.53 — ABNORMAL HIGH (ref 0.00–1.49)
Prothrombin Time: 27.7 seconds — ABNORMAL HIGH (ref 11.6–15.2)

## 2011-06-21 LAB — TSH: TSH: 4.784 u[IU]/mL — ABNORMAL HIGH (ref 0.350–4.500)

## 2011-06-21 LAB — HEMOGLOBIN A1C: Mean Plasma Glucose: 120 mg/dL — ABNORMAL HIGH (ref <117)

## 2011-06-21 LAB — PRO B NATRIURETIC PEPTIDE: Pro B Natriuretic peptide (BNP): 424.9 pg/mL — ABNORMAL HIGH (ref 0–125)

## 2011-06-21 LAB — HEMOGLOBIN AND HEMATOCRIT, BLOOD: Hemoglobin: 15.3 g/dL (ref 13.0–17.0)

## 2011-06-22 LAB — CBC
Hemoglobin: 15.8 g/dL (ref 13.0–17.0)
MCH: 30.7 pg (ref 26.0–34.0)
MCV: 87.8 fL (ref 78.0–100.0)
Platelets: 169 10*3/uL (ref 150–400)
RBC: 5.15 MIL/uL (ref 4.22–5.81)
WBC: 7.6 10*3/uL (ref 4.0–10.5)

## 2011-06-22 LAB — BASIC METABOLIC PANEL
BUN: 14 mg/dL (ref 6–23)
Chloride: 103 mEq/L (ref 96–112)
GFR calc Af Amer: >90 mL/min (ref >90)
GFR calc non Af Amer: >90 mL/min (ref >90)
Potassium: 4 mEq/L (ref 3.5–5.1)
Sodium: 138 mEq/L (ref 135–145)

## 2011-06-22 LAB — PROTIME-INR: Prothrombin Time: 22.2 seconds — ABNORMAL HIGH (ref 11.6–15.2)

## 2011-06-23 LAB — CBC
Hemoglobin: 15.9 g/dL (ref 13.0–17.0)
Platelets: 153 10*3/uL (ref 150–400)
RBC: 5.21 MIL/uL (ref 4.22–5.81)
WBC: 7.7 10*3/uL (ref 4.0–10.5)

## 2011-06-23 LAB — PROTIME-INR
INR: 1.5 — ABNORMAL HIGH (ref 0.00–1.49)
Prothrombin Time: 18.4 seconds — ABNORMAL HIGH (ref 11.6–15.2)

## 2011-06-23 LAB — MAGNESIUM: Magnesium: 2.1 mg/dL (ref 1.5–2.5)

## 2011-06-23 LAB — BASIC METABOLIC PANEL
CO2: 25 mEq/L (ref 19–32)
Calcium: 9 mg/dL (ref 8.4–10.5)
Creatinine, Ser: 0.73 mg/dL (ref 0.50–1.35)
GFR calc non Af Amer: >90 mL/min (ref >90)
Sodium: 137 mEq/L (ref 135–145)

## 2011-06-24 LAB — BASIC METABOLIC PANEL
CO2: 23 mEq/L (ref 19–32)
Chloride: 101 mEq/L (ref 96–112)
Creatinine, Ser: 0.85 mg/dL (ref 0.50–1.35)
Glucose, Bld: 132 mg/dL — ABNORMAL HIGH (ref 70–99)
Sodium: 138 mEq/L (ref 135–145)

## 2011-06-24 LAB — PROTIME-INR: INR: 1.6 — ABNORMAL HIGH (ref 0.00–1.49)

## 2011-06-25 LAB — HEMOGLOBIN AND HEMATOCRIT, BLOOD: HCT: 47.7 % (ref 39.0–52.0)

## 2011-06-25 LAB — MAGNESIUM: Magnesium: 2.1 mg/dL (ref 1.5–2.5)

## 2011-06-25 LAB — PROTIME-INR
INR: 2.01 — ABNORMAL HIGH (ref 0.00–1.49)
Prothrombin Time: 23.1 seconds — ABNORMAL HIGH (ref 11.6–15.2)

## 2011-06-25 LAB — BASIC METABOLIC PANEL
Calcium: 9.4 mg/dL (ref 8.4–10.5)
GFR calc non Af Amer: >90 mL/min (ref >90)
Potassium: 4.2 mEq/L (ref 3.5–5.1)
Sodium: 136 mEq/L (ref 135–145)

## 2011-06-27 NOTE — Discharge Summary (Signed)
Lucas Porter, Lucas Porter NO.:  [id number]  MEDICAL RECORD NO.:  [id number]  LOCATION:  2034                         FACILITY:  [facility]  PHYSICIAN:  Italy Hilty, MD         DATE OF BIRTH:  02-Aug-1956  DATE OF ADMISSION:  06/20/2011 DATE OF DISCHARGE:  06/25/2011                              DISCHARGE SUMMARY   DISCHARGE DIAGNOSES: 1. Chest pain, negative myocardial infarction, stable chest pain that     the patient has had since bypass grafting. 2. Coronary artery disease with bypass grafting in 2010 and negative     nuclear stress test in July, 2012. 3. Laceration of gum with bleeding secondary to flossing, bleeding     resolving at discharge. 4. Paroxysmal atrial fibrillation, failed sotalol and Multaq, most     recently the Multaq, placed on Tikosyn this admission.     a.     TEE with negative clots on June 22, 2011.     b.     Cardioversion, June 25, 2011 into sinus rhythm. 5. Anticoagulation, now with INR therapeutic. 6. 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  DISCHARGE CONDITION:  Stable and improved.  PROCEDURES:  TEE on June 22, 2011 by Dr. Rachelle Hora Croitoru.  On June 25, 2011, CARDIOVERSION at the bedside by Dr. Royann Shivers and anesthesia with Dr. Ivin Booty.  The patient received propofol 100 mg IV and lidocaine IV, was initially cardioverted attempted with 150 joules, but continued AFib and was cardioverted with 200 joules, synchronized cardioversion into a sinus rhythm.  No immediate complications.  DISCHARGE MEDICATIONS:  See medication reconciliation sheet.  Please note, the patient was not on sotalol prior to this admission despite med rec having this on the list, it had been stopped as an outpatient.  The patient was on Multaq as an outpatient and it has been discontinued as well.  We also discontinued the patient's Cardizem and we placed him on Tikosyn.  Additionally, we are adding Amicar  oral rinse to help the bleeding gum stop.  HOSPITAL COURSE:  A 55 year old white male with a history of coronary artery disease with bypass grafting in 2010 along with history of permanent pacemaker, Medtronic device secondary to tachy-brady syndrome, and paroxysmal atrial fibrillation, maintaining AFib most of the time since July 2012.  He has been on multiple medications including sotalol which was discontinued as an outpatient and was placed on Multaq.  The patient presented to the emergency room on June 20, 2011 with left- sided substernal chest pain, though the patient states that he had gone to urgent care secondary to his gum bleeding.  He lacerated his gum with dental floss and it had been bleeding for 2-3 days.  He went to urgent care of some type, and when they asked if he had chest pain, he stated he did have chest pain, but he has had chest pain since 2010.  We recently did a stress test in the office which was negative for ischemia.  Urgent Care then sent him to the emergency room.  Here in the ER, he did stated the pain was 3 to 4/10 without radiation, it  relieved easily with rest.  The patient was admitted to step-down to rule out MI. He was negative for myocardial infarction.  His pacemaker was interrogated which revealed mostly atrial fibrillation 99% at the time. Dr. Allyson Sabal saw him and felt from the chest pain issue he was stable.  We do see his nitroglycerin.  I also talked to Dr. Royann Shivers, his primary cardiologist, and discussion was made on cardioverting him to sinus rhythm, but he would need a washout of the Multaq.  Multaq was discontinued and eventually Cardizem discontinued, and then he was started on Tikosyn on June 23, 2011.  Also, additionally on admission his lipase was 158, on recheck it was totally normal, one-day was less than 24 hours later.  We did do a gallbladder ultrasound which was negative.  Dr. Royann Shivers talked to the patient at length  concerning the atrial fibrillation.  He had no further chest pain or any other than his chronic chest pain.  It was felt he would undergo TEE as we do not have record of current pro time in the chart though he was therapeutic on admission.  TEE revealed no clots and plan would be to load the Tikosyn and do plan cardioversion on Monday.  Over the weekend, the patient was stable without complications and without complaints.  We did have case manager assist with Tikosyn, but the patient co-pays are 50 dollars, but he will get the 7-day supply until the pharmacy can order the Tikosyn and have it ready for the patient.  Over the weekend, he was on Lovenox and Coumadin.  His INR had decreased and he otherwise was stable.  By June 25, 2011, INR was therapeutic at 2.01.  We discontinued his Lovenox.  His QTc was 420 msec on just regular ventricular beat, on paced ventricular beat it was 460 msec.  The patient underwent cardioversion without complications.  We talked to Oral Surgeon who recommended Amicar to control bleeding. We will hold the area of laseration with gauze with Amicar in place for  15 minutes at site and then he will rinse 10 mL every hour x10 hours and  then stop the medication.  Once the Amicar has stopped or slowed his bleeding, he will be discharged home.  DISCHARGE INSTRUCTIONS: 1. Increase activity slowly.  Please note, no Multaq, no Cardizem or     diltiazem, no sotalol. 2. Low-sodium heart healthy diet. 3. Follow with Dr. Royann Shivers on July 26, 2011 at 8:45 am.  We have     canceled his July 06, 2011 visit. 4. Follow up with Coumad[facility] Clinic at [facility]     on June 27, 2011 at 10:50 am.  Also, the patient will download     the pacer information next Monday on the October 15, so that our     office will receive.  LABORATORY DATA:  Discharge magnesium 2.1.  Sodium 136 potassium 4.2, chloride 103, CO2 24, glucose 113, BUN 18,  creatinine 0.82, and calcium 9.4.  Please note, his potassium was also slightly low, so we have added a potassium supplement to the patient's medical regimen.  Hemoglobin at discharge 16.7, hematocrit 47.7.  Protime 23.1, INR 2.01.  T3 free was 2.9.  Free T4 was 1.05.  Hemoglobin A1c was 5.8.  TSH 4.784.  MRSA screen was negative.  Total cholesterol 148, triglycerides 194, HDL 33 LDL 76.  Lipase initially was 158 and followup was 38.  BNP on admission was 424.  RADIOLOGY:  Chest x-ray on  admission, this is portable chest, no active disease, left basilar atelectasis.  Abdominal ultrasound was done on June 21, 2011 secondary to abnormal lipase, mild nonspecific heterogeneity of the hepatic parenchyma without discrete lesion, no ascites, no intra or extrahepatic biliary duct dilatation and no cholelithiasis.  Right kidney was normal, measuring 1.7 cm and the left kidney was normal and measuring 13 cm.  Abdominal aorta was without aneurysm.  EKGs initially AFib with no acute changes, maintained AFib with QTc less than 500 msec during Tikosyn load and on October 8 after his cardioversion, sinus rhythm, nonspecific changes, but no acute changes.  TEE, no clot in the LA, low velocity in the LA appendage, proceed with cardioversion on Monday after loading with Tikosyn.     Darcella Gasman. Annie Paras, N.P.   ______________________________ [place] Hilty, MD    LRI/MEDQ  D:  06/25/2011  T:  06/25/2011  Job:  [id number]  cc:   Thurmon Fair, MD Marcos Eke. Hal Hope, M.D.  Electronically Signed by Nada Boozer N.P. on 06/26/2011 02:45:48 PM Electronically Signed by Kirtland Bouchard. HILTY M.D. on 06/27/2011 08:36:25 PM

## 2011-06-29 NOTE — Op Note (Signed)
  NAMEARNET, HOFFERBER NO.:  [id number]  MEDICAL RECORD NO.:  [id number]  LOCATION:  2034                         FACILITY:  [facility]  PHYSICIAN:  Thurmon Fair, MD     DATE OF BIRTH:  01/26/1956  DATE OF PROCEDURE: DATE OF DISCHARGE:  06/25/2011                              OPERATIVE REPORT   REASON FOR PROCEDURE: 1. Atrial fibrillation, symptomatic despite good rate control. 2. Coronary artery disease, status post previous bypass surgery.  PROCEDURES PERFORMED:  Synchronized cardioversion.  After risks and benefits of the procedure were described, the patient provided informed consent.  He was in the fasting state.  Moderate sedation using intravenous propofol was administered by Dr. Ivin Booty of the Anesthesiology Service (propofol 100 mg intravenously).  Initial synchronized biphasic 150 joule shock was administered via anterior and posterior chest pads, but the rhythm remained atrial fibrillation.  The second shock (synchronized biphasic 200 joules) was administered with conversion to sinus rhythm with a rate of approximately 60 beats per minute.  No immediate complications occurred.  Pacemaker interrogation to follow.     Thurmon Fair, MD     MC/MEDQ  D:  06/25/2011  T:  06/25/2011  Job:  [id number]  cc:   [facility]  Electronically Signed by Thurmon Fair M.D. on 06/29/2011 11:26:11 AM

## 2011-08-01 LAB — PACEMAKER DEVICE OBSERVATION

## 2011-08-27 ENCOUNTER — Ambulatory Visit: Payer: Self-pay | Admitting: Family Medicine

## 2011-09-29 ENCOUNTER — Encounter: Payer: Self-pay | Admitting: Internal Medicine

## 2011-10-01 ENCOUNTER — Encounter (INDEPENDENT_AMBULATORY_CARE_PROVIDER_SITE_OTHER): Payer: BC Managed Care – PPO | Admitting: Family Medicine

## 2011-10-01 DIAGNOSIS — Z23 Encounter for immunization: Secondary | ICD-10-CM

## 2011-10-01 DIAGNOSIS — I2510 Atherosclerotic heart disease of native coronary artery without angina pectoris: Secondary | ICD-10-CM

## 2011-10-01 DIAGNOSIS — F411 Generalized anxiety disorder: Secondary | ICD-10-CM

## 2011-10-01 DIAGNOSIS — R0602 Shortness of breath: Secondary | ICD-10-CM

## 2011-10-01 DIAGNOSIS — Z0000 Encounter for general adult medical examination without abnormal findings: Secondary | ICD-10-CM

## 2011-10-02 ENCOUNTER — Encounter: Payer: Self-pay | Admitting: Family Medicine

## 2011-10-02 DIAGNOSIS — E785 Hyperlipidemia, unspecified: Secondary | ICD-10-CM

## 2011-10-02 DIAGNOSIS — E291 Testicular hypofunction: Secondary | ICD-10-CM | POA: Insufficient documentation

## 2011-10-02 DIAGNOSIS — I10 Essential (primary) hypertension: Secondary | ICD-10-CM

## 2011-10-02 DIAGNOSIS — I4891 Unspecified atrial fibrillation: Secondary | ICD-10-CM

## 2011-10-02 DIAGNOSIS — I2510 Atherosclerotic heart disease of native coronary artery without angina pectoris: Secondary | ICD-10-CM

## 2011-10-02 DIAGNOSIS — I480 Paroxysmal atrial fibrillation: Secondary | ICD-10-CM | POA: Insufficient documentation

## 2011-10-02 DIAGNOSIS — F419 Anxiety disorder, unspecified: Secondary | ICD-10-CM | POA: Insufficient documentation

## 2011-10-12 ENCOUNTER — Encounter (HOSPITAL_COMMUNITY): Payer: Self-pay | Admitting: Respiratory Therapy

## 2011-10-12 ENCOUNTER — Other Ambulatory Visit: Payer: Self-pay | Admitting: Cardiovascular Disease

## 2011-10-17 ENCOUNTER — Encounter (HOSPITAL_COMMUNITY): Payer: Self-pay | Admitting: *Deleted

## 2011-10-17 ENCOUNTER — Inpatient Hospital Stay (HOSPITAL_COMMUNITY)
Admission: RE | Admit: 2011-10-17 | Discharge: 2011-10-19 | DRG: 854 | Disposition: A | Discharge status: Home / Self Care | Payer: BC Managed Care – PPO | Source: Ambulatory Visit | Attending: Cardiovascular Disease | Admitting: Cardiovascular Disease

## 2011-10-17 ENCOUNTER — Encounter (HOSPITAL_COMMUNITY)
Admission: RE | Disposition: A | Discharge status: Home / Self Care | Payer: Self-pay | Source: Ambulatory Visit | Attending: Cardiovascular Disease

## 2011-10-17 ENCOUNTER — Other Ambulatory Visit: Payer: Self-pay

## 2011-10-17 DIAGNOSIS — D689 Coagulation defect, unspecified: Secondary | ICD-10-CM | POA: Diagnosis not present

## 2011-10-17 DIAGNOSIS — I25810 Atherosclerosis of coronary artery bypass graft(s) without angina pectoris: Secondary | ICD-10-CM | POA: Diagnosis present

## 2011-10-17 DIAGNOSIS — I25110 Atherosclerotic heart disease of native coronary artery with unstable angina pectoris: Secondary | ICD-10-CM | POA: Diagnosis present

## 2011-10-17 DIAGNOSIS — I4891 Unspecified atrial fibrillation: Secondary | ICD-10-CM | POA: Diagnosis present

## 2011-10-17 DIAGNOSIS — I2582 Chronic total occlusion of coronary artery: Secondary | ICD-10-CM | POA: Diagnosis present

## 2011-10-17 DIAGNOSIS — I209 Angina pectoris, unspecified: Secondary | ICD-10-CM | POA: Diagnosis present

## 2011-10-17 DIAGNOSIS — E669 Obesity, unspecified: Secondary | ICD-10-CM | POA: Diagnosis present

## 2011-10-17 DIAGNOSIS — M129 Arthropathy, unspecified: Secondary | ICD-10-CM | POA: Diagnosis present

## 2011-10-17 DIAGNOSIS — Z7982 Long term (current) use of aspirin: Secondary | ICD-10-CM

## 2011-10-17 DIAGNOSIS — Z7902 Long term (current) use of antithrombotics/antiplatelets: Secondary | ICD-10-CM

## 2011-10-17 DIAGNOSIS — Z8673 Personal history of transient ischemic attack (TIA), and cerebral infarction without residual deficits: Secondary | ICD-10-CM

## 2011-10-17 DIAGNOSIS — Z79899 Other long term (current) drug therapy: Secondary | ICD-10-CM

## 2011-10-17 DIAGNOSIS — E785 Hyperlipidemia, unspecified: Secondary | ICD-10-CM | POA: Diagnosis present

## 2011-10-17 DIAGNOSIS — Z950 Presence of cardiac pacemaker: Secondary | ICD-10-CM

## 2011-10-17 DIAGNOSIS — I10 Essential (primary) hypertension: Secondary | ICD-10-CM | POA: Diagnosis present

## 2011-10-17 DIAGNOSIS — F411 Generalized anxiety disorder: Secondary | ICD-10-CM | POA: Diagnosis present

## 2011-10-17 DIAGNOSIS — Z87891 Personal history of nicotine dependence: Secondary | ICD-10-CM

## 2011-10-17 DIAGNOSIS — I2510 Atherosclerotic heart disease of native coronary artery without angina pectoris: Principal | ICD-10-CM | POA: Diagnosis present

## 2011-10-17 DIAGNOSIS — Z7901 Long term (current) use of anticoagulants: Secondary | ICD-10-CM

## 2011-10-17 DIAGNOSIS — Z955 Presence of coronary angioplasty implant and graft: Secondary | ICD-10-CM

## 2011-10-17 HISTORY — PX: PERCUTANEOUS CORONARY STENT INTERVENTION (PCI-S): SHX5485

## 2011-10-17 HISTORY — DX: Anxiety disorder, unspecified: F41.9

## 2011-10-17 HISTORY — DX: Presence of coronary angioplasty implant and graft: Z95.5

## 2011-10-17 HISTORY — DX: Unspecified atrial fibrillation: I48.91

## 2011-10-17 HISTORY — DX: Essential (primary) hypertension: I10

## 2011-10-17 HISTORY — DX: Cardiac arrhythmia, unspecified: I49.9

## 2011-10-17 HISTORY — DX: Coagulation defect, unspecified: D68.9

## 2011-10-17 HISTORY — DX: Atherosclerotic heart disease of native coronary artery without angina pectoris: I25.10

## 2011-10-17 HISTORY — PX: LEFT HEART CATHETERIZATION WITH CORONARY/GRAFT ANGIOGRAM: SHX5450

## 2011-10-17 LAB — PROTIME-INR: Prothrombin Time: 13.8 seconds (ref 11.6–15.2)

## 2011-10-17 SURGERY — LEFT HEART CATHETERIZATION WITH CORONARY/GRAFT ANGIOGRAM
Anesthesia: LOCAL

## 2011-10-17 MED ORDER — SODIUM CHLORIDE 0.9 % IJ SOLN: 3.0000 mL | INTRAMUSCULAR | Status: DC | PRN

## 2011-10-17 MED ORDER — MIDAZOLAM HCL 2 MG/2ML IJ SOLN
INTRAMUSCULAR | Status: AC
  Filled 2011-10-17: qty 2

## 2011-10-17 MED ORDER — WARFARIN SODIUM 2.5 MG PO TABS
2.5000 mg | ORAL_TABLET | ORAL | Status: DC
  Filled 2011-10-17: qty 1

## 2011-10-17 MED ORDER — SODIUM CHLORIDE 0.9 % IV SOLN
INTRAVENOUS | Status: DC
  Administered 2011-10-17: 14:00:00 via INTRAVENOUS

## 2011-10-17 MED ORDER — TESTOSTERONE 50 MG/5GM (1%) TD GEL
5.0000 g | Freq: Two times a day (BID) | TRANSDERMAL | Status: DC
  Administered 2011-10-18: 5 g via TRANSDERMAL
  Filled 2011-10-17: qty 5

## 2011-10-17 MED ORDER — SODIUM CHLORIDE 0.9 % IJ SOLN
3.0000 mL | Freq: Two times a day (BID) | INTRAMUSCULAR | Status: DC
  Administered 2011-10-18 (×2): 3 mL via INTRAVENOUS

## 2011-10-17 MED ORDER — ALUM & MAG HYDROXIDE-SIMETH 200-200-20 MG/5ML PO SUSP
30.0000 mL | Freq: Three times a day (TID) | ORAL | Status: DC | PRN
  Administered 2011-10-17: 30 mL via ORAL

## 2011-10-17 MED ORDER — FENTANYL CITRATE 0.05 MG/ML IJ SOLN
INTRAMUSCULAR | Status: AC
  Filled 2011-10-17: qty 2

## 2011-10-17 MED ORDER — FAMOTIDINE IN NACL 20-0.9 MG/50ML-% IV SOLN
INTRAVENOUS | Status: AC
  Filled 2011-10-17: qty 50

## 2011-10-17 MED ORDER — WARFARIN SODIUM 2.5 MG PO TABS: 2.5000 mg | ORAL_TABLET | Freq: Every day | ORAL | Status: DC

## 2011-10-17 MED ORDER — CLOPIDOGREL BISULFATE 300 MG PO TABS
ORAL_TABLET | ORAL | Status: AC
  Filled 2011-10-17: qty 2

## 2011-10-17 MED ORDER — LIDOCAINE HCL (PF) 1 % IJ SOLN
INTRAMUSCULAR | Status: AC
  Filled 2011-10-17: qty 30

## 2011-10-17 MED ORDER — ONDANSETRON HCL 4 MG/2ML IJ SOLN
4.0000 mg | Freq: Four times a day (QID) | INTRAMUSCULAR | Status: DC | PRN
  Administered 2011-10-19: 4 mg via INTRAVENOUS
  Filled 2011-10-17 (×2): qty 2

## 2011-10-17 MED ORDER — DOFETILIDE 500 MCG PO CAPS
500.0000 ug | ORAL_CAPSULE | Freq: Two times a day (BID) | ORAL | Status: DC
  Administered 2011-10-17 – 2011-10-19 (×4): 500 ug via ORAL
  Filled 2011-10-17 (×6): qty 1

## 2011-10-17 MED ORDER — WARFARIN SODIUM 5 MG PO TABS
5.0000 mg | ORAL_TABLET | ORAL | Status: DC
  Filled 2011-10-17: qty 1

## 2011-10-17 MED ORDER — LISINOPRIL 20 MG PO TABS
20.0000 mg | ORAL_TABLET | Freq: Every day | ORAL | Status: DC
  Administered 2011-10-17 – 2011-10-19 (×3): 20 mg via ORAL
  Filled 2011-10-17 (×4): qty 1

## 2011-10-17 MED ORDER — SODIUM CHLORIDE 0.9 % IV SOLN: 250.0000 mL | INTRAVENOUS | Status: DC

## 2011-10-17 MED ORDER — OXYCODONE-ACETAMINOPHEN 5-325 MG PO TABS
1.0000 | ORAL_TABLET | Freq: Four times a day (QID) | ORAL | Status: DC | PRN
  Administered 2011-10-18 (×2): 1 via ORAL
  Administered 2011-10-18 – 2011-10-19 (×2): 2 via ORAL
  Filled 2011-10-17 (×2): qty 1
  Filled 2011-10-17: qty 2
  Filled 2011-10-17: qty 1

## 2011-10-17 MED ORDER — HEPARIN (PORCINE) IN NACL 2-0.9 UNIT/ML-% IJ SOLN
INTRAMUSCULAR | Status: AC
  Filled 2011-10-17: qty 2000

## 2011-10-17 MED ORDER — NITROGLYCERIN 0.2 MG/ML ON CALL CATH LAB
INTRAVENOUS | Status: AC
  Filled 2011-10-17: qty 1

## 2011-10-17 MED ORDER — BIVALIRUDIN 250 MG IV SOLR
INTRAVENOUS | Status: AC
  Filled 2011-10-17: qty 250

## 2011-10-17 MED ORDER — ATENOLOL 50 MG PO TABS
50.0000 mg | ORAL_TABLET | Freq: Every day | ORAL | Status: DC
  Administered 2011-10-17 – 2011-10-19 (×3): 50 mg via ORAL
  Filled 2011-10-17 (×4): qty 1

## 2011-10-17 MED ORDER — ROSUVASTATIN CALCIUM 40 MG PO TABS
40.0000 mg | ORAL_TABLET | Freq: Every day | ORAL | Status: DC
  Administered 2011-10-17 – 2011-10-18 (×2): 40 mg via ORAL
  Filled 2011-10-17 (×4): qty 1

## 2011-10-17 MED ORDER — VENLAFAXINE HCL ER 150 MG PO CP24
150.0000 mg | ORAL_CAPSULE | Freq: Every day | ORAL | Status: DC
  Administered 2011-10-18 – 2011-10-19 (×2): 150 mg via ORAL
  Filled 2011-10-17 (×4): qty 1

## 2011-10-17 MED ORDER — ALPRAZOLAM 0.25 MG PO TABS: 0.2500 mg | ORAL_TABLET | Freq: Two times a day (BID) | ORAL | Status: DC | PRN

## 2011-10-17 MED ORDER — ACETAMINOPHEN 325 MG PO TABS: 650.0000 mg | ORAL_TABLET | ORAL | Status: DC | PRN

## 2011-10-17 MED ORDER — SODIUM CHLORIDE 0.9 % IV SOLN: 1.0000 mL/kg/h | INTRAVENOUS | Status: AC

## 2011-10-17 MED ORDER — ALUM & MAG HYDROXIDE-SIMETH 200-200-20 MG/5ML PO SUSP
ORAL | Status: AC
  Filled 2011-10-17: qty 30

## 2011-10-17 MED ORDER — MORPHINE SULFATE 2 MG/ML IJ SOLN
2.0000 mg | INTRAMUSCULAR | Status: DC | PRN
  Administered 2011-10-18 – 2011-10-19 (×6): 2 mg via INTRAVENOUS
  Filled 2011-10-17 (×6): qty 1

## 2011-10-17 MED ORDER — POTASSIUM CHLORIDE CRYS ER 10 MEQ PO TBCR
10.0000 meq | EXTENDED_RELEASE_TABLET | Freq: Every day | ORAL | Status: DC
  Administered 2011-10-17 – 2011-10-18 (×2): 10 meq via ORAL
  Filled 2011-10-17 (×4): qty 1

## 2011-10-17 MED ORDER — ASPIRIN 81 MG PO CHEW
81.0000 mg | CHEWABLE_TABLET | Freq: Every day | ORAL | Status: DC
  Administered 2011-10-18 – 2011-10-19 (×2): 81 mg via ORAL
  Filled 2011-10-17 (×2): qty 1

## 2011-10-17 MED ORDER — CLOPIDOGREL BISULFATE 75 MG PO TABS
75.0000 mg | ORAL_TABLET | Freq: Every day | ORAL | Status: DC
  Administered 2011-10-18 – 2011-10-19 (×2): 75 mg via ORAL
  Filled 2011-10-17 (×2): qty 1

## 2011-10-17 NOTE — Op Note (Signed)
CARDIAC CATHETERIZATION REPORT   Procedures performed:  1. Left heart catheterization  2. Selective coronary angiography  3. Selective angiography of saphenous vein graft bypasses to the diagonal artery and ramus intermedius artery, of the LIMA bypass to the LAD artery and of the free radial bypass to the left PDA. 4. Left ventriculography   Reason for procedure:  Stable angina pectoris Coronary artery disease status post previous coronary bypass surgery  Procedure performed by: Thurmon Fair, MD, [facility]  Complications: none   Estimated blood loss: less than 5 mL   History:  56 year old man roughly one year status post multivessel bypass surgery with persistent anginal chest pain ever since the surgical procedure. He also has persistent atrial fibrillation that has recurred soon after elective cardioversion.  Consent: The risks, benefits, and details of the procedure were explained to the patient. Risks including death, MI, stroke, bleeding, limb ischemia, renal failure and allergy were described and accepted by the patient. Informed written consent was obtained prior to proceeding.  Technique: The patient was brought to the cardiac catheterization laboratory in the fasting state. He was prepped and draped in the usual sterile fashion. Local anesthesia with 1% lidocaine was administered to the right groin area. Using the modified Seldinger technique a 5 French right common femoral artery sheath was introduced without difficulty. Under fluoroscopic guidance, using 5 [place] JL4, JR and angled pigtail catheters, selective cannulation of the left coronary artery, right coronary artery and left ventricle were respectively performed. All the bypasses were also cannulated with the JR catheter. Several coronary angiograms of each vessel in a variety of projections were recorded, as well as a left ventriculogram in the RAO projection. Left ventricular pressure and a pull back to the aorta were recorded.  No immediate complications occurred.   The diagnostic procedure will be immediately followed by a percutaneous revascularization of the left main to the left circumflex coronary artery by Dr. [name] Lemma.  Contrast used: 120 mL Omnipaque  Angiographic Findings:  1. The left main coronary artery is is very short but is severely diseased throughout especially in its distal portion where there is an approximately 70% stenosis. It generates multiple branches: The LAD artery, a larger cranial radius intermedius artery, a smaller bifurcating distal ramus intermedius artery and the dominant left circumflex coronary artery. All the branch vessels appear to have significant ostial stenosis. Retrograde filling of the vein graft bypass to the ramus intermedius artery is seen. Small amount of competitive flow is seen to the LAD artery. 2. The left anterior descending artery is not seen during angiography of the left coronary artery the to a severe ostial stenosis. By previous angiography its is a large vessel that reaches the apex and generates  one major diagonal branch.  3. The cranial ramus intermedius artery has an 90% ostial stenosis that extends over a long portion of the proximal vessel; retrograde filling of a saphenous vein graft bypasses he 4. The posterior ramus intermedius artery is relatively small and bifurcates early it also has an approximately 90% ostial stenosis 5. The left circumflex coronary artery is a very large-size vessel dominant vessel that generates 4 major branches: 3 major oblique marginal/posterolateral arteries (the third being the largest) and a large left posterior descending artery. There is a 70% ostial stenosis. There is evidence of moderate diffuse luminal irregularities and mild calcification throughout the remainder of the vessel. No other hemodynamically meaningful stenoses are seen. There may be a 50% stenosis just before the crux and the beginning  of the PDA. But the areas  difficult to visualize due to tortuosity and overlap. 6. The right coronary artery is a small-size non- dominant vessel that generates only right ventricular branches. There is evidence of severe luminal irregularities and mild calcification. A. 70% stenosis is seen in the mid vessel.  7. The left internal mammary artery bypass to the mid LAD artery is a widely patent healthy vessel. 8. The saphenous vein graft bypass to the ramus intermedius artery is widely patent with minimal luminal irregularities. 9. The saphenous vein graft bypass to the first diagonal artery has mild luminal irregularities has excellent flow and feeds a relatively small vascular bed. 10. The radial artery bypass to the posterior descending artery is totally occluded. A short segment of vessel with an extremely thin lumen is seen. No distal flow is noted. Even after administration of 200 mcg of intra-arterial nitroglycerin there is no improvement 5. The left ventricle is normal in size. The left ventricle systolic function is normal .No regional wall motion abnormalities are seen. No left ventricular thrombus is seen. There is no mitral insufficiency. The ascending aorta appears normal. There is no aortic valve stenosis by pullback. The left ventricular end-diastolic pressure is 10 mm Hg.    IMPRESSIONS:  There appears to be an extensive area of myocardium downstream of a severe stenosis involving the distal left main coronary artery and the ostium of the left circumflex coronary artery.  RECOMMENDATION:  Consider percutaneous for vascularization with angioplasty and stenting of the protected left main coronary artery and ostial left circumflex coronary artery lesions.

## 2011-10-17 NOTE — Progress Notes (Addendum)
ACT 353 at 2320. Replaced heparinized saline to normal saline in pressurized bag.

## 2011-10-17 NOTE — H&P (Signed)
Date of Initial H&P: 01//2013  History reviewed, patient examined, no change in status, stable for surgery. Thurmon Fair, MD, [facility] [phone] 10/17/2011 1:38 PM

## 2011-10-17 NOTE — Progress Notes (Signed)
@  1930-ACT = 369  @2005 - ACT = 270 @2120 - ACT = 358 Will pull sheath when ACT = 175 per MD orders.

## 2011-10-17 NOTE — Op Note (Signed)
[facility]     CARDIAC CATHETERIZATION REPORT  NAME:  Lucas Porter     MRN: [id number] DATE OF BIRTH:  15-Nov-1955   ADMIT DATE:  10/17/2011  Performing Cardiologist: Marykay Lex, M.D., M.S. Primary Physician: Dois Davenport., MD, MD Primary Cardiologist:  Octavio Graves, M.D.  Procedures Performed:  Percutaneous Coronary Artery Intervention on Mid Left Main into the Proximal Left Circumflex with a Promus Element DES  4.0 mm x  16 mm; final diameter: 4.12 mm  Indication(s): Persistent stable angina pectoris  Coronary Disease status post CABG  Progression of the distal Left Main into Proximal Left Circumflex disease  History: 56 y.o. male roughly one year status post multivessel bypass surgery with persistent anginal chest pain ever since the surgical procedure. He also has persistent atrial fibrillation that has recurred soon after elective cardioversion.  Consent: The procedure with Risks/Benefits/Alternatives and Indications was reviewed with the patient prior to his diagnostic procedure with the known possibility of intervention.  All questions were answered.     Risks of procedure as well as the alternatives and risks of each were explained to the (patient/caregiver).  Consent for procedure obtained.   After reviewing the initial cineangiography images, the culprit lesion involving the distal left main (protected) into a large, dominant left circumflex was identified, and the decision was made to proceed with percutaneous coronary intervention.  PERCUTANEOUS CORONARY INTERVENTION PROCEDURE  The existing 5 French sheath to seizure 6 [place] sheath A weight based bolus of IV Angiomax was administered and the drip was continued until completion of the procedure.  An ACT of > 200 Sec was confirmed prior to advancing the Guidewire. Due to the questionable history of TIA in the past, Oral 600 mg Clopidogrel was administered.  The Guide catheter  was  advanced over a J-wire and used to engage the left Coronary Artery.   Lesion:   70% distal left main extending into proximal circumflex. The left main is protected with LAD Ramus and Diagonal grafts  Pre-PCI Stenosis:  70 % Post-PCI Stenosis:  70 %     TIMI  3 flow       TIMI  3 flow  Guide Catheter:  6 French XB 3.5   Guidewire:  BMW  Pre-Dilitation Balloon:  Emerge Monorail 3.0 mm x  12 mm   1st Inflation:  8 Atm for 18 Sec   2nd Inflation: 8 Atm for 15 Sec   3rd Inflation:  8 Atm for  15 Sec Scout angiography did not reveal evidence of dissection or perforation.  There is actually improved flow down the native Ramus branches.    Stent:  Promus Element DES  4.0 mm x  16mm   Deployment:   12 Atm for  30 Sec  Scout angiography did not reveal evidence of dissection or perforation  Post-Dilitation Balloon:  Gamewell Quantum Apex   1st Inflation:  16 Atm for 24 Sec   2nd Inflation: 16 Atm for 10 Sec   3rd Inflation: 16 Atm for  30 Sec   4th Inflation:  14 Atm for  30 Sec; proximal   Post deployment angiography with and without wire revealed excellent stent expansion with no dissection or perforation noted. There is a brisk flow down the native ramus branches were no further vision of flow of a small bifurcating high acute marginal branch.   The catheter was removed acuity of the body over wire without accommodations. The sheath was sutured in place.  The patient was transported to the  PACU in hemodynamics stable, chest pain-freecondition.   The patient  was stable before, during and following the procedure.   Patient did tolerate procedure well. There were not complications.  EBL: < 10 mL  Medications:  Total Sedation:   3 mg IV Versed,  100 IV mcg Fentanyl  Contrast:   225 mm Omnipaque (120 mm for intervention)    Angiomax post drip as noted above  Clopidogrel 600 mg by mouth   Famotidine 20 mg IV  Impression:  Successful PCI of Distal Left Main and the Left Circumflex with a Promus  Element DES stent -- 4.0 mm x 16 mm postdilated to 4.12 mm    Plan:  Monitor overnight in 2500 with standard post-procedure monitoring.  Continue home medications  Dual Antiplatelet therapy for a minimum 1 year.   Restart warfarin the morning  The case and results was discussed with the patient (and family). The case and results was not discussed with the patient's PCP. The case and results was discussed with the patient's Cardiologist.  Time Spend Directly with Patient:  45 minutes  HARDING,DAVID W, M.D., M.S. [facility] [address] [place], [place]  [address]  [phone]

## 2011-10-17 NOTE — Progress Notes (Signed)
ACT 270 @ 2240. Angiomax was d/c'd at 1638 per cath lab report.

## 2011-10-18 ENCOUNTER — Other Ambulatory Visit: Payer: Self-pay

## 2011-10-18 LAB — APTT: aPTT: 163 seconds — ABNORMAL HIGH (ref 24–37)

## 2011-10-18 LAB — CBC
Hemoglobin: 15.7 g/dL (ref 13.0–17.0)
MCH: 30 pg (ref 26.0–34.0)
Platelets: 171 10*3/uL (ref 150–400)
RBC: 5.23 MIL/uL (ref 4.22–5.81)
WBC: 9 10*3/uL (ref 4.0–10.5)

## 2011-10-18 LAB — POCT ACTIVATED CLOTTING TIME
Activated Clotting Time: 380 seconds
Activated Clotting Time: 364 seconds
Activated Clotting Time: 358 seconds
Activated Clotting Time: 364 seconds
Activated Clotting Time: 347 seconds
Activated Clotting Time: 336 seconds
Activated Clotting Time: 358 seconds

## 2011-10-18 LAB — HEPATIC FUNCTION PANEL
ALT: 14 U/L (ref 0–53)
Bilirubin, Direct: <0.1 mg/dL (ref 0.0–0.3)
Total Bilirubin: 0.6 mg/dL (ref 0.3–1.2)

## 2011-10-18 LAB — BASIC METABOLIC PANEL
CO2: 24 mEq/L (ref 19–32)
Chloride: 103 mEq/L (ref 96–112)
Glucose, Bld: 99 mg/dL (ref 70–99)
Potassium: 4.3 mEq/L (ref 3.5–5.1)
Sodium: 138 mEq/L (ref 135–145)

## 2011-10-18 LAB — PROTIME-INR
INR: 1.01 (ref 0.00–1.49)
Prothrombin Time: 13.5 seconds (ref 11.6–15.2)

## 2011-10-18 LAB — FIBRINOGEN: Fibrinogen: 484 mg/dL — ABNORMAL HIGH (ref 204–475)

## 2011-10-18 MED FILL — Dextrose Inj 5%: INTRAVENOUS | Qty: 50 | Status: AC

## 2011-10-18 NOTE — Consult Note (Signed)
[facility] CONSULTATION NOTE  Reason for Consult:    HYQ:MVHQION L Lucas Porter is an 56 y.o. 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 5 days  prior to procedure, Coumadin 5 mg had been placed on hold. Angiomax given prior to cath (1/30 at 16:01 pm) and d/c'd at 16:38. Pt was placed on Asa and Plavix, with plans to restart Coumadin on 1/31. Of note, Plavix had been give due to a possible history of TIA in the past at 600 mg po. No heparin was given.  Despite successful procedure, ACT was elevated, thus arterial sheath was not able to be removed yet as a precautionary measure. Other than R groin, no active areas of bleeding are noted. No reversal of anticoagulation was given to date.  His PT/INR on 1/24 was 21.5 and 1.8 respectively, with PTT 191. H/H was 16.8 and 48.8 with platelets nl at 171. His PT/INR on 1/31 was 13.5 and 1.0 ,PTT 141 (Important to mention that PTT was 163 4:15 am). H/H  15.7and 45.6 with platelets nl at 171. Chemistries normal. Fibrinogen is 484. Pt denies any new meds over the last year with the exception to Tikosyn, initiated about 2 moths ago. He denies any recent trips, ticks, or having had these hematological issues in the recent past. No exposure to new chemicals or pesticides. No risks for Hepatitis or HIV.No recent infections.  Activated Clotting Time is somewhat returning to baseline, having dropped from 1000 to 300 today. Other meds prior to admission including tikosyn, niaspan, effexor and pravachol.   PMH: Past Medical History  Diagnosis Date  . Angina   . Shortness of breath   . Coronary artery disease   . Hypertension   . Anxiety   . Arthritis   . Dysrhythmia   Hypogonadism Mild Obesity Upper Airway resistance syndrome, no CPAP  needed. Dyslipidemia  Surgeries: Past Surgical History  Procedure Date  . Coronary angioplasty   . Coronary artery bypass graft June 2012, Dr. Donata Clay   . 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 aspirin 81 MG tablet Take 81 mg by mouth daily.      Marland Kitchen atenolol (TENORMIN) 50 MG tablet Take 50 mg by mouth daily.      Marland Kitchen dofetilide (TIKOSYN) 500 MCG capsule Take 500 mcg by mouth 2 (two) times daily.      . fish oil-omega-3 fatty acids 1000 MG capsule Take 2 g by mouth 2 (two) times daily.      Marland Kitchen lisinopril (PRINIVIL,ZESTRIL) 20 MG tablet Take 20 mg by mouth daily.      . niacin (NIASPAN) 1000 MG CR tablet Take 1,000 mg by mouth at bedtime.      . potassium chloride (K-DUR,KLOR-CON) 10 MEQ tablet Take 10 mEq by mouth daily.      . pravastatin (PRAVACHOL) 40 MG tablet Take 40 mg by mouth daily.      Marland Kitchen testosterone (ANDROGEL) 50 MG/5GM GEL Place 5 g onto the skin 2 (two) times daily.      Marland Kitchen venlafaxine (EFFEXOR) 75 MG tablet Take 75 mg by mouth daily.       Marland Kitchen venlafaxine (  EFFEXOR-XR) 150 MG 24 hr capsule Take 150 mg by mouth daily.      Marland Kitchen warfarin (COUMADIN) 5 MG tablet Take 2.5-5 mg by mouth daily. 1/2 tablet Sun, Tues, Wed, Thurs, Sat 1 Tablet Mon, Fri        ZHY:QMVHQIONGEXBM, ALPRAZolam, alum & mag hydroxide-simeth, morphine, ondansetron (ZOFRAN) IV, oxyCODONE-acetaminophen, sodium chloride, DISCONTD: sodium chloride  ROS: Constitutional: Negative for weight loss. Negative for fever, chills and malaise/fatigue.  Eyes: Negative for blurred vision and double vision.  Respiratory: Negative for cough, hemoptysis and mild chronic shortness of breath.  Cardiovascular: Negative for chest pain at this time. Known irregular heart beat. Occasional presyncopal events GI: No nausea, vomiting, diarrhea, constipation. No  change in bowel caliber. No  Melena or hematochezia.  GU: No blood in urine. No loss of urinary control.Pt was on AndroGel 1% 2 packets daily for recent diagnosis of hypoandrogenism which was d/c's by admitting MD to evaluate for possible drug side effect causing these hematological issues. Skin: Negative for itching. No rash. No petechia. No bruising Neurological: No headaches. No motor or sensory deficits.  Family History:  No family history on file. No family history of bleeding disorders.  Social History:  reports that he quit smoking about 33 years ago. His smoking use included Cigarettes. He quit after 1 year of use. He does not have any smokeless tobacco history on file. He reports that he drinks about .6 ounces of alcohol per week. He reports that he does not use illicit drugs. Pt works as a Facilities manager.  Physical Exam  56 year old  in no acute distress A. and O. x3 General well-developed and well-nourished  HEENT: Normocephalic, atraumatic, PERRLA. Oral cavity without thrush or lesions. Neck supple. no thyromegaly, no cervical or supraclavicular adenopathy  Lungs clear bilaterally . No wheezing, rhonchi or rales. No axillary masses. Cardiac regular rate and rhythm normal S1-S2, no murmur , rubs or gallops Abdomen moderately obese. soft nontender , bowel sounds x4. No HSM GU/rectal: deferred. Extremities no clubbing cyanosis or edema. No bruising or petechial rash. R groin sheath with no visible areas of bleeding surrounding it..     Labs:  CBC   Lab 10/18/11 0415  WBC 9.0  HGB 15.7  HCT 45.6  PLT 171  MCV 87.2  MCH 30.0  MCHC 34.4  RDW 14.2  LYMPHSABS --  MONOABS --  EOSABS --  BASOSABS --  BANDABS --     CMP    Lab 10/18/11 1417 10/18/11 0415  NA -- 138  K -- 4.3  CL -- 103  CO2 -- 24  GLUCOSE -- 99  BUN -- 13  CREATININE -- 0.72  CALCIUM -- 9.0  MG -- --  AST 16 --  ALT 14 --  ALKPHOS 63 --  BILITOT 0.6 --        Component Value  Date/Time   BILITOT 0.6 10/18/2011 1417   BILIDIR <0.1 10/18/2011 1417   IBILI NOT CALCULATED 10/18/2011 1417       Lab 10/18/11 0415 10/17/11 1335  INR 1.01 1.04  PROTIME -- --     Imaging Studies: No results found.      A/P: 56 y.o. male asked to see to r/o coagulation disorder in the setting of recent cardiac catheterization and stenting on 1/30 and multiple cardiac issues requiring Coumadin, Asa, Plavix and Angiomax perioperatively. In addition, Androgel is being investigated as a potential contributing factor in hematological abnornality.  Dr. Drue Second  is to see the patient  following this consult with recommendations regarding diagnosis, treatment options and further workup studies.  Thank you for the referral.  [facility] 10/18/2011 3:32 PM  ATTENDING:  patient seen and chart reviewed. Patient with likely a coagulopathy. Cardiology unable to pull sheath.  Recommend: Work up for possible coagulopathy Check ptt mixing study, vonWillebrands Will discuss with one of my partners to see what else maybe happening. If patient is bleeding then will recommend plasma transfusion or cryoprecipitates to obtain hemostasis.  Drue Second, MD Medical/Oncology [facility] [phone] (beeper) [phone] (Office)  10/19/2011, 8:11 AM

## 2011-10-18 NOTE — Progress Notes (Signed)
ACT 358 at 0550. Gretchen RN from cath lab aware. Pt denies any discomforts.

## 2011-10-18 NOTE — Progress Notes (Signed)
ACT= 347 at 0115

## 2011-10-18 NOTE — Progress Notes (Signed)
Subjective:  No CP/SOB  Objective:  Temp:  [96.9 F (36.1 C)-98.7 F (37.1 C)] 97.8 F (36.6 C) (01/31 0353) Pulse Rate:  [72-136] 72  (01/31 0353) Resp:  [11-29] 11  (01/31 0000) BP: (116-129)/(68-95) 127/71 mmHg (01/31 0353) SpO2:  [95 %-98 %] 95 % (01/31 0353) Weight:  [99.791 kg (220 lb)] 99.791 kg (220 lb) (01/30 1353) Weight change:   Intake/Output from previous day: 01/30 0701 - 01/31 0700 In: 3 [I.V.:3] Out: 700 [Urine:700]  Intake/Output from this shift:    Physical Exam: General appearance: alert and cooperative Neck: no adenopathy, no carotid bruit, no JVD, supple, symmetrical, trachea midline and thyroid not enlarged, symmetric, no tenderness/mass/nodules Lungs: clear to auscultation bilaterally Heart: regular rate and rhythm, S1, S2 normal, no murmur, click, rub or gallop Extremities: extremities normal, atraumatic, no cyanosis or edema Pulses: 2+ and symmetric  Lab Results: Results for orders placed during the hospital encounter of 10/17/11 (from the past 48 hour(s))  PROTIME-INR     Status: Normal   Collection Time   10/17/11  1:35 PM      Component Value Range Comment   Prothrombin Time 13.8  11.6 - 15.2 (seconds)    INR 1.04  0.00 - 1.49    POCT ACTIVATED CLOTTING TIME     Status: Normal   Collection Time   10/17/11  4:16 PM      Component Value Range Comment   Activated Clotting Time >1000     CBC     Status: Normal   Collection Time   10/18/11  4:15 AM      Component Value Range Comment   WBC 9.0  4.0 - 10.5 (K/uL)    RBC 5.23  4.22 - 5.81 (MIL/uL)    Hemoglobin 15.7  13.0 - 17.0 (g/dL)    HCT 40.9  81.1 - 91.4 (%)    MCV 87.2  78.0 - 100.0 (fL)    MCH 30.0  26.0 - 34.0 (pg)    MCHC 34.4  30.0 - 36.0 (g/dL)    RDW 78.2  95.6 - 21.3 (%)    Platelets 171  150 - 400 (K/uL)   BASIC METABOLIC PANEL     Status: Normal   Collection Time   10/18/11  4:15 AM      Component Value Range Comment   Sodium 138  135 - 145 (mEq/L)    Potassium 4.3  3.5 -  5.1 (mEq/L)    Chloride 103  96 - 112 (mEq/L)    CO2 24  19 - 32 (mEq/L)    Glucose, Bld 99  70 - 99 (mg/dL)    BUN 13  6 - 23 (mg/dL)    Creatinine, Ser 0.86  0.50 - 1.35 (mg/dL)    Calcium 9.0  8.4 - 10.5 (mg/dL)    GFR calc non Af Amer >90  >90 (mL/min)    GFR calc Af Amer >90  >90 (mL/min)   PROTIME-INR     Status: Normal   Collection Time   10/18/11  4:15 AM      Component Value Range Comment   Prothrombin Time 13.5  11.6 - 15.2 (seconds)    INR 1.01  0.00 - 1.49      Imaging: Imaging results have been reviewed  Assessment/Plan:   1. Active Problems: 2.  * No active hospital problems. *  3.   Time Spent Directly with Patient:  30 minutes  Length of Stay:  LOS: 1 day   S/P  Cath with protected LM stenting by Dr. Herbie Baltimore with a DES. Grafts patent. No CP. Exam benign. Labs OK. Pt does have PAF and currently in NSR, a-pacing. He was in aflutter earlier in the day. He still has an arterial sheath in with a persistently elevated ACT for unclear reasons. We will continue to monitor. Will also need to be recoumadinized and sent home on asa 81 mg, plavix 75 mg and coumadin.   Runell Gess 10/18/2011, 8:04 AM

## 2011-10-18 NOTE — Progress Notes (Signed)
Consulted with Clydie Braun, PhD and Donell Sievert, [place] regarding high ACT and sheath pull.

## 2011-10-18 NOTE — Progress Notes (Signed)
0900-1000 Cardiac Rehab Pt is still on bedrest due to sheath still in. Completed stent discharge education with pt and wife. Pt declines Outpt CRP due to his work hours.

## 2011-10-18 NOTE — Progress Notes (Signed)
Corine Shelter, [place] contacted in regards to what we should do about the pt.  ACT level as of 2000 was 309 (needs to be <175 to pull sheath) and we wanted to know how to proceed.  Orders were received to hold coumadin.  Still waiting for hematology to round on pt to figure out what is going on with the pts ACT levels.

## 2011-10-19 ENCOUNTER — Encounter (HOSPITAL_COMMUNITY)
Admission: RE | Disposition: A | Discharge status: Home / Self Care | Payer: Self-pay | Source: Ambulatory Visit | Attending: Cardiovascular Disease

## 2011-10-19 ENCOUNTER — Encounter (HOSPITAL_COMMUNITY): Payer: Self-pay | Admitting: Cardiology

## 2011-10-19 DIAGNOSIS — Z955 Presence of coronary angioplasty implant and graft: Secondary | ICD-10-CM

## 2011-10-19 DIAGNOSIS — I25110 Atherosclerotic heart disease of native coronary artery with unstable angina pectoris: Secondary | ICD-10-CM | POA: Diagnosis present

## 2011-10-19 DIAGNOSIS — D689 Coagulation defect, unspecified: Secondary | ICD-10-CM | POA: Diagnosis not present

## 2011-10-19 DIAGNOSIS — Z950 Presence of cardiac pacemaker: Secondary | ICD-10-CM

## 2011-10-19 HISTORY — PX: CORONARY ANGIOPLASTY: SHX604

## 2011-10-19 LAB — POCT ACTIVATED CLOTTING TIME
Activated Clotting Time: 314 seconds
Activated Clotting Time: 309 seconds
Activated Clotting Time: 342 seconds
Activated Clotting Time: 342 seconds
Activated Clotting Time: 254 seconds

## 2011-10-19 SURGERY — Surgical Case

## 2011-10-19 MED ORDER — SODIUM CHLORIDE 0.9 % IJ SOLN: 3.0000 mL | Freq: Two times a day (BID) | INTRAMUSCULAR | Status: DC

## 2011-10-19 MED ORDER — OXYCODONE-ACETAMINOPHEN 5-325 MG PO TABS: 1.0000 | ORAL_TABLET | ORAL | Status: DC | PRN

## 2011-10-19 MED ORDER — SODIUM CHLORIDE 0.9 % IV SOLN: 1.0000 mL/kg/h | INTRAVENOUS | Status: AC

## 2011-10-19 MED ORDER — ONDANSETRON HCL 4 MG/2ML IJ SOLN: 4.0000 mg | Freq: Four times a day (QID) | INTRAMUSCULAR | Status: DC | PRN

## 2011-10-19 MED ORDER — MIDAZOLAM HCL 2 MG/2ML IJ SOLN
INTRAMUSCULAR | Status: AC
  Filled 2011-10-19: qty 2

## 2011-10-19 MED ORDER — SODIUM CHLORIDE 0.9 % IJ SOLN: 3.0000 mL | INTRAMUSCULAR | Status: DC | PRN

## 2011-10-19 MED ORDER — ACETAMINOPHEN 325 MG PO TABS: 650.0000 mg | ORAL_TABLET | ORAL | Status: DC | PRN

## 2011-10-19 MED ORDER — CLOPIDOGREL BISULFATE 75 MG PO TABS: 75.0000 mg | ORAL_TABLET | Freq: Every day | ORAL | Status: DC

## 2011-10-19 MED ORDER — FENTANYL CITRATE 0.05 MG/ML IJ SOLN
INTRAMUSCULAR | Status: AC
  Filled 2011-10-19: qty 2

## 2011-10-19 MED ORDER — ASPIRIN 81 MG PO CHEW: 81.0000 mg | CHEWABLE_TABLET | Freq: Every day | ORAL | Status: DC

## 2011-10-19 MED ORDER — SODIUM CHLORIDE 0.9 % IV SOLN: 250.0000 mL | INTRAVENOUS | Status: DC

## 2011-10-19 NOTE — Progress Notes (Signed)
Dr. Welton Flakes was notified of pts critical lab values as follows: Collagen/ADP >300; Collagen/Epinephrine >300.  PFA interpretation included in the results review.  No orders received.  Would not advise regarding sheath pull.

## 2011-10-19 NOTE — Discharge Summary (Signed)
Physician Discharge Summary  Patient ID: Lucas Porter MRN: [id number] DOB/AGE: 56-Nov-1957 56 y.o.  Admit date: 10/17/2011 Discharge date: 10/19/2011  Discharge Diagnoses:  Principal Problem:  *Angina pectoris Active Problems:  CAD (coronary artery disease) with CABG 2011, LIMA to LAD, Free radial to Lt. PDA, VG to Diag and ramus intermedius  Hypertension  Dyslipidemia  S/P coronary artery stent placement to distal Lt. main and Lt. Cx, 10/17/11  Coagulopathy  History of pacemaker, with a-v pacing   Discharged Condition: stable  Hospital Course:  56 year old man roughly one year status post multivessel bypass surgery with persistent anginal chest pain ever since the surgical procedure. He also has persistent atrial fibrillation that has recurred soon after elective cardioversion.  He presented for left heart cath which revealed extensive left main disease.   Successful PCI of Distal Left Main and the Left Circumflex with a Promus Element DES stent -- 4.0 mm x 16 mm postdilated to 4.12 mm.  The patient had persistently elevated ACT which resulted in the cath sheath not being remove in the usual time frame.   Hematology consult was requested.  PTT elevated at 141.  The patient was then taken back to the cath lab on 10/19/11 for Angioseal hemostasis and sheath removal.  No complications.  Coumadin will be held until PTT is normal.  It will be checked on Monday Feb   Consults: Hematology  Significant Diagnostic Studies:  After usual antisepsis precautions were taken and local anesthesia with 1% lidocaine was administered., the right femoral artery sheath was removed and replaced over the wire with a 59F Angioseal hemostasis device. Hemostasis was excellent. No complications occurred.  Thurmon Fair, MD, [facility]  [facility]  1/30, PERCUTANEOUS CORONARY INTERVENTION PROCEDURE  The existing 5 French sheath to seizure 6 [place] sheath  A weight based bolus of IV Angiomax  was administered and the drip was continued until completion of the procedure. An ACT of > 200 Sec was confirmed prior to advancing the Guidewire.  Due to the questionable history of TIA in the past, Oral 600 mg Clopidogrel was administered.  The Guide catheter was advanced over a J-wire and used to engage the left Coronary Artery.  Lesion: 70% distal left main extending into proximal circumflex. The left main is protected with LAD Ramus and Diagonal grafts  Pre-PCI Stenosis: 70 % Post-PCI Stenosis: 70 %  TIMI 3 flow TIMI 3 flow  Guide Catheter: 6 French XB 3.5 Guidewire: BMW  Pre-Dilitation Balloon: Emerge Monorail 3.0 mm x 12 mm  1st Inflation: 8 Atm for 18 Sec  2nd Inflation: 8 Atm for 15 Sec  3rd Inflation: 8 Atm for 15 Sec  Scout angiography did not reveal evidence of dissection or perforation. There is actually improved flow down the native Ramus branches.  Stent: Promus Element DES 4.0 mm x 16mm  Deployment: 12 Atm for 30 Sec  Scout angiography did not reveal evidence of dissection or perforation  Post-Dilitation Balloon: [place] Quantum Apex  1st Inflation: 16 Atm for 24 Sec  2nd Inflation: 16 Atm for 10 Sec  3rd Inflation: 16 Atm for 30 Sec  4th Inflation: 14 Atm for 30 Sec; proximal  Post deployment angiography with and without wire revealed excellent stent expansion with no dissection or perforation noted. There is a brisk flow down the native ramus branches were no further vision of flow of a small bifurcating high acute marginal branch.  The catheter was removed acuity of the body over wire without  accommodations. The sheath was sutured in place.  The patient was transported to the PACU in hemodynamics stable, chest pain-freecondition.  The patient was stable before, during and following the procedure.  Patient did tolerate procedure well.  There were not complications.  EBL: < 10 mL  Medications:  Total Sedation: 3 mg IV Versed, 100 IV mcg Fentanyl  Contrast: 225 mm Omnipaque  (120 mm for intervention)  Angiomax post drip as noted above  Clopidogrel 600 mg by mouth  Famotidine 20 mg IV  Impression:  Successful PCI of Distal Left Main and the Left Circumflex with a Promus Element DES stent -- 4.0 mm x 16 mm postdilated to 4.12 mm  Plan:  Monitor overnight in 2500 with standard post-procedure monitoring.  Continue home medications  Dual Antiplatelet therapy for a minimum 1 year.  Restart warfarin the morning   10/17/11 CARDIAC CATHETERIZATION REPORT   Procedures performed:  1. Left heart catheterization  2. Selective coronary angiography  3. Selective angiography of saphenous vein graft bypasses to the diagonal artery and ramus intermedius artery, of the LIMA bypass to the LAD artery and of the free radial bypass to the left PDA.  4. Left ventriculography  Reason for procedure:  Stable angina pectoris  Coronary artery disease status post previous coronary bypass surgery  Procedure performed by: Thurmon Fair, MD, [facility]  Complications: none  Estimated blood loss: less than 5 mL  History: 56 year old man roughly one year status post multivessel bypass surgery with persistent anginal chest pain ever since the surgical procedure. He also has persistent atrial fibrillation that has recurred soon after elective cardioversion.  Consent: The risks, benefits, and details of the procedure were explained to the patient. Risks including death, MI, stroke, bleeding, limb ischemia, renal failure and allergy were described and accepted by the patient. Informed written consent was obtained prior to proceeding.  Technique: The patient was brought to the cardiac catheterization laboratory in the fasting state. He was prepped and draped in the usual sterile fashion. Local anesthesia with 1% lidocaine was administered to the right groin area. Using the modified Seldinger technique a 5 French right common femoral artery sheath was introduced without difficulty. Under fluoroscopic  guidance, using 5 [place] JL4, JR and angled pigtail catheters, selective cannulation of the left coronary artery, right coronary artery and left ventricle were respectively performed. All the bypasses were also cannulated with the JR catheter. Several coronary angiograms of each vessel in a variety of projections were recorded, as well as a left ventriculogram in the RAO projection. Left ventricular pressure and a pull back to the aorta were recorded. No immediate complications occurred.  The diagnostic procedure will be immediately followed by a percutaneous revascularization of the left main to the left circumflex coronary artery by Dr. Kynadie Yaun Lemma.  Contrast used: 120 mL Omnipaque  Angiographic Findings:  1. The left main coronary artery is is very short but is severely diseased throughout especially in its distal portion where there is an approximately 70% stenosis. It generates multiple branches: The LAD artery, a larger cranial radius intermedius artery, a smaller bifurcating distal ramus intermedius artery and the dominant left circumflex coronary artery. All the branch vessels appear to have significant ostial stenosis. Retrograde filling of the vein graft bypass to the ramus intermedius artery is seen. Small amount of competitive flow is seen to the LAD artery.  2. The left anterior descending artery is not seen during angiography of the left coronary artery the to a severe ostial stenosis. By  previous angiography its is a large vessel that reaches the apex and generates one major diagonal branch.  3. The cranial ramus intermedius artery has an 90% ostial stenosis that extends over a long portion of the proximal vessel; retrograde filling of a saphenous vein graft bypasses he  4. The posterior ramus intermedius artery is relatively small and bifurcates early it also has an approximately 90% ostial stenosis  5. The left circumflex coronary artery is a very large-size vessel dominant vessel that  generates 4 major branches: 3 major oblique marginal/posterolateral arteries (the third being the largest) and a large left posterior descending artery. There is a 70% ostial stenosis. There is evidence of moderate diffuse luminal irregularities and mild calcification throughout the remainder of the vessel. No other hemodynamically meaningful stenoses are seen. There may be a 50% stenosis just before the crux and the beginning of the PDA. But the areas difficult to visualize due to tortuosity and overlap.  6. The right coronary artery is a small-size non- dominant vessel that generates only right ventricular branches. There is evidence of severe luminal irregularities and mild calcification. A. 70% stenosis is seen in the mid vessel.  7. The left internal mammary artery bypass to the mid LAD artery is a widely patent healthy vessel.  8. The saphenous vein graft bypass to the ramus intermedius artery is widely patent with minimal luminal irregularities.  9. The saphenous vein graft bypass to the first diagonal artery has mild luminal irregularities has excellent flow and feeds a relatively small vascular bed.  10. The radial artery bypass to the posterior descending artery is totally occluded. A short segment of vessel with an extremely thin lumen is seen. No distal flow is noted. Even after administration of 200 mcg of intra-arterial nitroglycerin there is no improvement  5. The left ventricle is normal in size. The left ventricle systolic function is normal .No regional wall motion abnormalities are seen. No left ventricular thrombus is seen. There is no mitral insufficiency. The ascending aorta appears normal. There is no aortic valve stenosis by pullback. The left ventricular end-diastolic pressure is 10 mm Hg.   IMPRESSIONS:  There appears to be an extensive area of myocardium downstream of a severe stenosis involving the distal left main coronary artery and the ostium of the left circumflex coronary  artery.  RECOMMENDATION:  Consider percutaneous for vascularization with angioplasty and stenting of the protected left main coronary artery and ostial left circumflex coronary artery lesions.       Discharge Exam: Blood pressure 112/71, pulse 86, temperature 97.9 F (36.6 C), temperature source Oral, resp. rate 18, height 5\' 10"  (1.778 m), weight 101 kg (222 lb 10.6 oz), SpO2 93.00%.   Disposition: Home or Self Care  Discharge Orders    Future Appointments: Provider: Department: Dept Phone: Center:   10/22/2011 11:15 AM Gardiner Rhyme, MD [facility] [phone] [id number]     Future Orders Please Complete By Expires   Diet - low sodium heart healthy      Increase activity slowly      Discharge instructions      Comments:   No lifting or driving for three days.   Call MD for:  redness, tenderness, or signs of infection (pain, swelling, redness, odor or green/yellow discharge around incision site)        Medication List  As of 10/19/2011  3:55 PM   STOP taking these medications         venlafaxine 75 MG tablet  warfarin 5 MG tablet         TAKE these medications         acetaminophen 325 MG tablet   Commonly known as: TYLENOL   Take 2 tablets (650 mg total) by mouth every 4 (four) hours as needed.      aspirin 81 MG tablet   Take 81 mg by mouth daily.      atenolol 50 MG tablet   Commonly known as: TENORMIN   Take 50 mg by mouth daily.      clopidogrel 75 MG tablet   Commonly known as: PLAVIX   Take 1 tablet (75 mg total) by mouth daily with breakfast.      dofetilide 500 MCG capsule   Commonly known as: TIKOSYN   Take 500 mcg by mouth 2 (two) times daily.      fish oil-omega-3 fatty acids 1000 MG capsule   Take 2 g by mouth 2 (two) times daily.      lisinopril 20 MG tablet   Commonly known as: PRINIVIL,ZESTRIL   Take 20 mg by mouth daily.      niacin 1000 MG CR tablet   Commonly known as: NIASPAN   Take 1,000 mg by mouth at bedtime.       potassium chloride 10 MEQ tablet   Commonly known as: K-DUR,KLOR-CON   Take 10 mEq by mouth daily.      pravastatin 40 MG tablet   Commonly known as: PRAVACHOL   Take 40 mg by mouth daily.      testosterone 50 MG/5GM Gel   Commonly known as: ANDROGEL   Place 5 g onto the skin 2 (two) times daily.      venlafaxine 150 MG 24 hr capsule   Commonly known as: EFFEXOR-XR   Take 150 mg by mouth daily.           Follow-up Information    Follow up with Marykay Lex, MD on 10/31/2011. (@1030 )    Contact information:   [facility] [address], [address] [place] [address] [phone]       Follow up with [facility] on 10/25/2011. (with Belenda Cruise at [facility]  at 1020)          Signed: Dwana Melena 10/19/2011, 3:55 PM

## 2011-10-19 NOTE — Progress Notes (Signed)
[facility]  Subjective: No CP or SOB.  Left shoulder pain, 7/10, sharp, worse with movement.  Objective: Vital signs in last 24 hours: Temp:  [97.7 F (36.5 C)-98.8 F (37.1 C)] 97.7 F (36.5 C) (02/01 0748) Pulse Rate:  [69-119] 72  (02/01 0748) Resp:  [15-18] 18  (02/01 0748) BP: (111-140)/(50-101) 112/69 mmHg (02/01 0748) SpO2:  [94 %-97 %] 97 % (02/01 0748) FiO2 (%):  [35 %] 35 % (01/31 1933) Weight:  [101 kg (222 lb 10.6 oz)] 101 kg (222 lb 10.6 oz) (02/01 0407) Last BM Date: 10/16/11  Intake/Output from previous day: 01/31 0701 - 02/01 0700 In: 120 [P.O.:120] Out: 700 [Urine:700] Intake/Output this shift:    Medications Current Facility-Administered Medications  Medication Dose Route Frequency Provider Last Rate Last Dose  . 0.9 %  sodium chloride infusion  250 mL Intravenous Continuous Marykay Lex, MD      . acetaminophen (TYLENOL) tablet 650 mg  650 mg Oral Q4H PRN Marykay Lex, MD      . ALPRAZolam Prudy Feeler) tablet 0.25 mg  0.25 mg Oral BID PRN Marykay Lex, MD      . alum & mag hydroxide-simeth (MAALOX/MYLANTA) 200-200-20 MG/5ML suspension 30 mL  30 mL Oral TID PRN Marykay Lex, MD   30 mL at 10/17/11 1710  . aspirin chewable tablet 81 mg  81 mg Oral Daily Marykay Lex, MD   81 mg at 10/18/11 1523  . atenolol (TENORMIN) tablet 50 mg  50 mg Oral Daily Marykay Lex, MD   50 mg at 10/18/11 1330  . clopidogrel (PLAVIX) tablet 75 mg  75 mg Oral Q breakfast Marykay Lex, MD   75 mg at 10/18/11 1523  . dofetilide (TIKOSYN) capsule 500 mcg  500 mcg Oral BID Marykay Lex, MD   500 mcg at 10/18/11 2134  . lisinopril (PRINIVIL,ZESTRIL) tablet 20 mg  20 mg Oral Daily Marykay Lex, MD   20 mg at 10/18/11 1330  . morphine 2 MG/ML injection 2 mg  2 mg Intravenous Q2H PRN Marykay Lex, MD   2 mg at 10/19/11 0229  . ondansetron (ZOFRAN) injection 4 mg  4 mg Intravenous Q6H PRN Marykay Lex, MD      .  oxyCODONE-acetaminophen [facility]) 5-325 MG per tablet 1-2 tablet  1-2 tablet Oral Q6H PRN Marykay Lex, MD   2 tablet at 10/19/11 0035  . potassium chloride (K-DUR,KLOR-CON) CR tablet 10 mEq  10 mEq Oral Daily Marykay Lex, MD   10 mEq at 10/18/11 1524  . rosuvastatin (CRESTOR) tablet 40 mg  40 mg Oral q1800 Marykay Lex, MD   40 mg at 10/18/11 2135  . sodium chloride 0.9 % injection 3 mL  3 mL Intravenous Q12H Marykay Lex, MD   3 mL at 10/18/11 2136  . sodium chloride 0.9 % injection 3 mL  3 mL Intravenous PRN Marykay Lex, MD      . venlafaxine [facility]) 24 hr capsule 150 mg  150 mg Oral Daily Marykay Lex, MD   150 mg at 10/18/11 1330  . warfarin (COUMADIN) tablet 2.5 mg  2.5 mg Oral Custom Derrious Bologna, MD      . warfarin (COUMADIN) tablet 5 mg  5 mg Oral Custom Yves Fodor, MD      . DISCONTD: testosterone (ANDROGEL) gel 5 g  5 g Transdermal BID Marykay Lex, MD   5 g  at 10/18/11 0314    PE: General appearance: alert, cooperative and no distress Lungs: clear to auscultation bilaterally Heart: regular rate and rhythm, S1, S2 normal, no murmur, click, rub or gallop Extremities: No LEE Pulses: 2+ and symmetric  Lab Results:   [facility] 10/18/11 0415  WBC 9.0  HGB 15.7  HCT 45.6  PLT 171   BMET  Basename 10/18/11 0415  NA 138  K 4.3  CL 103  CO2 24  GLUCOSE 99  BUN 13  CREATININE 0.72  CALCIUM 9.0   PT/INR  Basename 10/19/11 0503 10/18/11 0415 10/17/11 1335  LABPROT 13.1 13.5 13.8  INR 0.97 1.01 1.04     Assessment/Plan  Active Problems: CAD    Coagulopathy Afib.   S/P Cath with protected LM stenting by Dr. Herbie Baltimore with a DES. Grafts patent.  Persistently elevated ACT which actually went up at 2300hrs yesterday. Now 254 this AM.  BP/HR well controlled.  Maint. NSR intermittent pacing.  Coumadin held.  Recheck ACT at 10AM.  Hematology consulted.   LOS: 2 days    HAGER,[name] W 10/19/2011 8:04 AM  I have seen and examined the  patient along with Dwana Melena, PA.  I have reviewed the chart, notes and new data.  I agree with PA's note.  Key new complaints: shoulder pain is sharp and worse with movement, musculoskeletal etiololgy. Back sore from lying down so long Key examination changes: clear lungs; no overt bleeding / hematoma.  Key new findings / data: ACT was 254 at 0500h. Recheck at 1000h. If still high, will bring to cath lab to deploy Angioseal.  PLAN: Recheck at 1000h. If still high, will bring to cath lab to deploy Angioseal. Will probably have to hold warfarin until aPTT/ACT normal, but plan to resume long term for recurrent persistent AF. Currently in atrial paced rhythm.  Thurmon Fair, MD, [facility] [phone] 10/19/2011, 8:41 AM

## 2011-10-19 NOTE — Progress Notes (Signed)
Lucas Porter, [place] regarding abnormal platelet assays and the plan for the pt.  Plan is to continue to check ACTs q3hrs.  If ACT gets to be <175, orders were received to still keep the sheath in until the Cardiologist has seen him.  Pt currently stable, will continue to assess.

## 2011-10-19 NOTE — Procedures (Signed)
After usual antisepsis precautions were taken and local anesthesia with 1% lidocaine was administered., the right femoral artery sheath was removed and replaced over the wire with a 59F Angioseal hemostasis device. Hemostasis was excellent. No complications occurred. Thurmon Fair, MD, [facility] [phone] 10/19/2011  2:04 PM

## 2011-10-19 NOTE — Progress Notes (Signed)
ACTs were done q3hr as follows:  2000: 309 2300: 342 0200: 342 0500: 254

## 2011-10-22 ENCOUNTER — Encounter: Payer: Self-pay | Admitting: Internal Medicine

## 2011-10-22 ENCOUNTER — Ambulatory Visit (INDEPENDENT_AMBULATORY_CARE_PROVIDER_SITE_OTHER): Payer: BC Managed Care – PPO | Admitting: Internal Medicine

## 2011-10-22 VITALS — BP 140/92 | HR 74 | Resp 18 | Ht 70.0 in | Wt 218.1 lb

## 2011-10-22 DIAGNOSIS — I10 Essential (primary) hypertension: Secondary | ICD-10-CM

## 2011-10-22 DIAGNOSIS — I4891 Unspecified atrial fibrillation: Secondary | ICD-10-CM

## 2011-10-22 DIAGNOSIS — I480 Paroxysmal atrial fibrillation: Secondary | ICD-10-CM

## 2011-10-22 DIAGNOSIS — Z955 Presence of coronary angioplasty implant and graft: Secondary | ICD-10-CM

## 2011-10-22 DIAGNOSIS — Z9861 Coronary angioplasty status: Secondary | ICD-10-CM

## 2011-10-22 NOTE — Patient Instructions (Signed)
Your physician recommends that you schedule a follow-up appointment in: 4 weeks with Dr Allred  Your physician recommends that you continue on your current medications as directed. Please refer to the Current Medication list given to you today.     

## 2011-10-22 NOTE — Assessment & Plan Note (Signed)
Stable He will require longterm anticoagulation with plavix

## 2011-10-22 NOTE — Assessment & Plan Note (Addendum)
The patient has symptomatic paroxysmal atrial fibrillation.  He has failed medical therapy with sotalol and multaq and continues to have intermittent afib with tikosyn.  He also carries a h/o atrial flutter.  At times, he feels symptoms, though he is not sure that he can attribute his symptoms to afib or ischemia.  He is now recovering s/p recent PCI. Therapeutic strategies for afib including medicine and ablation were discussed in detail with the patient today. Risk, benefits, and alternatives to EP study and radiofrequency ablation for afib were also discussed in detail today.  At this point, he would like to defer catheter ablation. He would like to see if he is clinically improved from his recent PCI. If his symptoms resolve, then rate control may be a reasonable strategy for afib long term.  If he has recurrent symptomatic afib, then we will consider catheter ablation for afib and atrial flutter at that time. Given recent persistently elevated ACT, it may be beneficial to obtain a baseline ACT in the office before we proceed with catheter ablation. He should continue coumadin longterm for stroke prevention.

## 2011-10-22 NOTE — Progress Notes (Signed)
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  He reports initially being diagnosed with atrial fibrillation 02/2009 after presenting to his primary care physician for routine exam.  He reports in retrospect, he was SOB and fatigued at that time.  He underwent emergent cath which revealed severe CAD and therefore he had CABG.  He  Subsequently had an event monitor placed which revealed afib (22%) with tachy/brady syndrome.  He subsequently had a pacemaker implanted.  He reports symptoms of fatigue and "fluttering" during atrial fibrillation.  He also reports occasional SOB with decreased exercise tolerance.  He has failed medical therapy with sotalol and multaq.   He was then loaded on tikosyn 10/12 and required cardioversion at that time.  He is not sure that he feels significantly better post cardioversion. He continued to have "chest tightness".  He underwent subsequent cath 10/17/11 this revealed advanced CAD.  SVGs were patent except for SVG to PDA. There was felt to be an extensive area of myocardium downstream of a severe stenosis involving the distal left main coronary artery and the ostium of the left circumflex coronary artery.  He therefore underwent PCI of Distal Left Main and the Left Circumflex with a Promus Element DES stent.  He had difficulty with persistently elevated ACT post cath and sheath removal was delayed.  He has been placed on plavix and coumadin remains on hold.  His chest pain is now resolved.   Past Medical History  Diagnosis Date  . Coronary artery disease     s/p CABG 2010, s/p Successful PCI  10/17/11 of Distal Left Main and the Left Circumflex with a Promus Element DES stent -- 4.0 mm x 16 mm postdilated to 4.12  mm   . Hypertension   . Anxiety   . DDD (degenerative disc disease)   . Atrial fibrillation     paroxysmal  . S/P coronary artery stent placement to distal Lt. main and Lt. Cx 10/17/11  . 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 Coagulopathy     persistent elevation of ACT during 10/17/11 hospitalization  . Atrial flutter    Past Surgical History  Procedure Date  . Coronary angioplasty 10/19/11  . Coronary artery bypass graft 2010  . Insert / replace / remove pacemaker 2/12    Current Outpatient Prescriptions  Medication Sig Dispense Refill  . acetaminophen (TYLENOL) 325 MG tablet Take 2 tablets (650 mg total) by mouth every 4 (four) hours as needed.      Marland Kitchen aspirin 81 MG tablet Take 81 mg by mouth daily.      Marland Kitchen atenolol (TENORMIN) 50 MG tablet Take 50 mg by mouth daily.      . clopidogrel (PLAVIX) 75 MG tablet Take 1 tablet (75 mg total) by mouth daily with breakfast.  30 tablet  11  . dofetilide (TIKOSYN) 500 MCG capsule Take 500 mcg by mouth 2 (two) times daily.      . fish oil-omega-3 fatty acids 1000 MG capsule Take 2 g by mouth 2 (two) times daily.      Marland Kitchen lisinopril (PRINIVIL,ZESTRIL) 20 MG tablet Take 20 mg by mouth daily.      . niacin (NIASPAN) 1000 MG CR tablet Take 1,000  mg by mouth at bedtime.      . potassium chloride (K-DUR,KLOR-CON) 10 MEQ tablet Take 10 mEq by mouth daily.      . pravastatin (PRAVACHOL) 40 MG tablet Take 40 mg by mouth daily.      Marland Kitchen testosterone (ANDROGEL) 50 MG/5GM GEL Place 5 g onto the skin 2 (two) times daily.      Marland Kitchen venlafaxine (EFFEXOR-XR) 150 MG 24 hr capsule Take 225 mg by mouth daily.         No Known Allergies  History   Social History  . Marital Status: Married    Spouse Name: N/A    Number of Children: N/A  . Years of Education: N/A   Occupational History  . Not on file.   Social History Main Topics  . Smoking status: Former Smoker -- 1 years    Types: Cigarettes    Quit date: 10/18/1978  .  Smokeless tobacco: Not on file  . Alcohol Use: 0.6 oz/week    1 Cans of beer per week     rare  . Drug Use: No  . Sexually Active: Yes   Other Topics Concern  . Not on file   Social History Narrative   Pt lives in [place] with spouse.  2 grown daughters.  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    Family History  Problem Relation Age of Onset  . Coronary artery disease    . Stroke      ROS- All systems are reviewed and negative except as per the HPI above  Physical Exam: Filed Vitals:   10/22/11 1129  BP: 140/92  Pulse: 74  Resp: 18  Height: 5\' 10"  (1.778 m)  Weight: 218 lb 1.9 oz (98.939 kg)    GEN- The patient is well appearing, alert and oriented x 3 today.   Head- normocephalic, atraumatic Eyes-  Sclera clear, conjunctiva pink Ears- hearing intact Oropharynx- clear Neck- supple, no JVP Lymph- no cervical lymphadenopathy Lungs- Clear to ausculation bilaterally, normal work of breathing Heart- Regular rate and rhythm, no murmurs, rubs or gallops, PMI not laterally displaced GI- soft, NT, ND, + BS Extremities- no clubbing, cyanosis, or edema MS- no significant deformity or atrophy Skin- no rash or lesion Psych- euthymic mood, full affect Neuro- strength and sensation are intact  EKG today reveals Atrial pacing at 75 bpm, PR 194, QRS 76, QTc 420, PACs  Assessment and Plan:

## 2011-10-22 NOTE — Assessment & Plan Note (Signed)
Stable No change required today  

## 2011-10-23 LAB — PLATELET FUNCTION ASSAY

## 2011-10-31 ENCOUNTER — Telehealth: Payer: Self-pay | Admitting: Oncology

## 2011-10-31 NOTE — Telephone Encounter (Signed)
S/w the pt and he is aware of the hosp f/u appt on 11/01/2011 at 8:00am

## 2011-11-01 ENCOUNTER — Telehealth: Payer: Self-pay | Admitting: Oncology

## 2011-11-01 ENCOUNTER — Ambulatory Visit: Payer: BC Managed Care – PPO | Admitting: Oncology

## 2011-11-01 ENCOUNTER — Ambulatory Visit: Payer: BC Managed Care – PPO

## 2011-11-01 ENCOUNTER — Other Ambulatory Visit: Payer: BC Managed Care – PPO | Admitting: Lab

## 2011-11-01 NOTE — Telephone Encounter (Signed)
pt'a wife called yestewrday to state that her and her husband decided that he did not need to be seen by [place] as since he has since gone to about 5 different doctors and right now he is ok. Will cll back if anything changes

## 2011-11-05 ENCOUNTER — Other Ambulatory Visit: Payer: Self-pay | Admitting: Family Medicine

## 2011-11-14 ENCOUNTER — Other Ambulatory Visit: Payer: Self-pay | Admitting: Family Medicine

## 2011-11-21 ENCOUNTER — Other Ambulatory Visit: Payer: Self-pay | Admitting: Physician Assistant

## 2011-11-26 ENCOUNTER — Ambulatory Visit: Payer: BC Managed Care – PPO | Admitting: Internal Medicine

## 2011-11-27 ENCOUNTER — Encounter: Payer: Self-pay | Admitting: Internal Medicine

## 2011-12-18 ENCOUNTER — Other Ambulatory Visit: Payer: Self-pay | Admitting: Physician Assistant

## 2012-01-05 ENCOUNTER — Other Ambulatory Visit: Payer: Self-pay | Admitting: Internal Medicine

## 2012-02-01 ENCOUNTER — Encounter: Payer: Self-pay | Admitting: Internal Medicine

## 2012-02-04 ENCOUNTER — Other Ambulatory Visit: Payer: Self-pay | Admitting: Cardiovascular Disease

## 2012-02-06 ENCOUNTER — Encounter (HOSPITAL_COMMUNITY): Payer: Self-pay | Admitting: Pharmacy Technician

## 2012-02-08 ENCOUNTER — Ambulatory Visit (HOSPITAL_COMMUNITY)
Admission: RE | Admit: 2012-02-08 | Discharge: 2012-02-08 | Disposition: A | Discharge status: Home / Self Care | Payer: BC Managed Care – PPO | Source: Ambulatory Visit | Attending: Cardiovascular Disease | Admitting: Cardiovascular Disease

## 2012-02-08 ENCOUNTER — Encounter (HOSPITAL_COMMUNITY)
Admission: RE | Disposition: A | Discharge status: Home / Self Care | Payer: Self-pay | Source: Ambulatory Visit | Attending: Cardiovascular Disease

## 2012-02-08 DIAGNOSIS — I200 Unstable angina: Secondary | ICD-10-CM | POA: Insufficient documentation

## 2012-02-08 DIAGNOSIS — R0989 Other specified symptoms and signs involving the circulatory and respiratory systems: Secondary | ICD-10-CM | POA: Insufficient documentation

## 2012-02-08 DIAGNOSIS — I25810 Atherosclerosis of coronary artery bypass graft(s) without angina pectoris: Secondary | ICD-10-CM | POA: Insufficient documentation

## 2012-02-08 DIAGNOSIS — I2510 Atherosclerotic heart disease of native coronary artery without angina pectoris: Secondary | ICD-10-CM | POA: Insufficient documentation

## 2012-02-08 DIAGNOSIS — R0609 Other forms of dyspnea: Secondary | ICD-10-CM | POA: Insufficient documentation

## 2012-02-08 HISTORY — PX: LEFT HEART CATHETERIZATION WITH CORONARY/GRAFT ANGIOGRAM: SHX5450

## 2012-02-08 LAB — PROTIME-INR
INR: 0.98 (ref 0.00–1.49)
Prothrombin Time: 13.2 seconds (ref 11.6–15.2)

## 2012-02-08 LAB — POCT ACTIVATED CLOTTING TIME: Activated Clotting Time: 364 seconds

## 2012-02-08 SURGERY — LEFT HEART CATHETERIZATION WITH CORONARY/GRAFT ANGIOGRAM
Anesthesia: LOCAL

## 2012-02-08 MED ORDER — FENTANYL CITRATE 0.05 MG/ML IJ SOLN
INTRAMUSCULAR | Status: AC
  Filled 2012-02-08: qty 2

## 2012-02-08 MED ORDER — SODIUM CHLORIDE 0.9 % IJ SOLN: 3.0000 mL | INTRAMUSCULAR | Status: DC | PRN

## 2012-02-08 MED ORDER — LIDOCAINE HCL (PF) 1 % IJ SOLN
INTRAMUSCULAR | Status: AC
  Filled 2012-02-08: qty 30

## 2012-02-08 MED ORDER — NITROGLYCERIN 0.2 MG/ML ON CALL CATH LAB
INTRAVENOUS | Status: AC
  Filled 2012-02-08: qty 1

## 2012-02-08 MED ORDER — HEPARIN (PORCINE) IN NACL 2-0.9 UNIT/ML-% IJ SOLN
INTRAMUSCULAR | Status: AC
  Filled 2012-02-08: qty 2000

## 2012-02-08 MED ORDER — SODIUM CHLORIDE 0.9 % IV SOLN
INTRAVENOUS | Status: DC
Start: 2012-02-09 — End: 2012-02-08
  Administered 2012-02-08: 1000 mL via INTRAVENOUS

## 2012-02-08 MED ORDER — MIDAZOLAM HCL 2 MG/2ML IJ SOLN
INTRAMUSCULAR | Status: AC
  Filled 2012-02-08: qty 2

## 2012-02-08 NOTE — CV Procedure (Addendum)
CARDIAC CATHETERIZATION REPORT  Procedures performed:  1. Left heart catheterization  2. Selective coronary angiography  3. Selective angiography of saphenous vein graft bypasses to the diagonal artery and ramus intermedius artery, of the LIMA bypass to the LAD artery and of the free radial bypass to the left PDA.  4. Left ventriculography   Reason for procedure:  Unstable angina pectoris  Coronary artery disease status post previous coronary bypass surgery   Procedure performed by: Thurmon Fair, MD, [facility]  Complications: none  Estimated blood loss: less than 5 mL  History: 56 year old man roughly one year status post multivessel bypass surgery with occlusion of free radial graft to dominant LCX, s/p PCI/DES to [facility] in January. Now has recurrent symptoms with rapidly declining exercise tolerance. Also has increasing frequency of paroxysmal atrial fibrillation. Consent: The risks, benefits, and details of the procedure were explained to the patient. Risks including death, MI, stroke, bleeding, limb ischemia, renal failure and allergy were described and accepted by the patient. Informed written consent was obtained prior to proceeding.  Technique: The patient was brought to the cardiac catheterization laboratory in the fasting state. He was prepped and draped in the usual sterile fashion. Local anesthesia with 1% lidocaine was administered to the right groin area. Using the modified Seldinger technique a 5 French right common femoral artery sheath was introduced without difficulty. Under fluoroscopic guidance, using 5 [place] JL4, JR and angled pigtail catheters, selective cannulation of the left coronary artery, right coronary artery and left ventricle were respectively performed. All the bypasses were also cannulated with the JR catheter. Several coronary angiograms of each vessel in a variety of projections were recorded. The aortic valvr Left ventricular pressure and a pull back to the aorta  were recorded. No immediate complications occurred.  The diagnostic procedure will be immediately followed by a percutaneous revascularization of the left main to the left circumflex coronary artery by Dr. [name] Lemma.  Contrast used: 100 mL Omnipaque  Angiographic Findings:  1. The left main coronary artery is is very short but is severely diseased throughout especially in its distal portion where there is an approximately 70% stenosis. It generates multiple branches: The LAD artery, a larger cranial radius intermedius artery, a smaller bifurcating distal ramus intermedius artery and the dominant left circumflex coronary artery. All the branch vessels appear to have significant ostial stenosis. Retrograde filling of the vein graft bypass to the ramus intermedius artery is seen. Small amount of competitive flow is seen to the LAD artery. The left main to circumflex stent is widely patent with minimal in stent restenosis. 2. The left anterior descending artery is not seen during angiography of the left coronary artery the to a severe ostial stenosis. By previous angiography its is a large vessel that reaches the apex and generates one major diagonal branch.  3. The cranial ramus intermedius artery branch has an 90% ostial stenosis that extends over a long portion of the proximal vessel; retrograde filling of a saphenous vein graft bypass is seen 4. The caudal ramus intermedius artery branch is relatively small and bifurcates early it also has an approximately 90% ostial stenosis  5. The left circumflex coronary artery is a very large-size vessel dominant vessel that generates 4 major branches: 3 major oblique marginal/posterolateral arteries (the third being the largest) and a large left posterior descending artery. The left main stent extends across the ramii ostia into the circumlex artery and is widely patent as described above. There is evidence of moderate diffuse luminal  irregularities and mild  calcification throughout the remainder of the vessel. No other hemodynamically meaningful stenoses are seen. There may be a 40% stenosis just before the crux and the beginning of the PDA, but the area is difficult to visualize due to tortuosity and overlap.  6. The right coronary artery is a small-size non- dominant vessel that generates only right ventricular branches. There is evidence of severe luminal irregularities and mild calcification. A. 70% stenosis is seen in the mid vessel.  7. The left internal mammary artery bypass to the mid LAD artery is a widely patent healthy vessel.  8. The saphenous vein graft bypass to the ramus intermedius artery is widely patent with minimal luminal irregularities.  9. The saphenous vein graft bypass to the first diagonal artery has mild luminal irregularities has excellent flow and feeds a relatively small vascular bed.  10. The radial artery bypass to the posterior descending artery is totally occluded. A short segment of vessel with an extremely thin lumen is seen. No distal flow is noted. Even after administration of 200 mcg of intra-arterial nitroglycerin there is no improvement  5. The left ventricle was not injected. There is no aortic valve stenosis by pullback. The left ventricular end-diastolic pressure is 10 mm Hg.   IMPRESSIONS:  Mot of the major coronary territories are well supplied. Ischemia may be present in a small portion of the anterolateral wall (ramii intermedii territory) and the apical right ventricle. Revascularization appears neither necessary, nor feasible in these areas. RECOMMENDATION:  Coronary risk factor modification. Refer for atrial fibrillation radiofrequency ablation.   Thurmon Fair, MD, [facility] [phone] office [phone] pager 02/08/2012 3:04 PM

## 2012-02-08 NOTE — H&P (Addendum)
Date of Initial H&P: 01/31/2012  History reviewed, patient examined, no change in status, stable for surgery. 57 yo with recent CABG but with early occlusion of the radial artery bypass to the left posterior descending artery and roughly 4 months s/p successful PCI of Distal Left Main and the Left Circumflex with a Promus Element DES stent -- 4.0 mm x 16 mm postdilated to 4.12 mm (Jan 30), presents with recurrent symptoms of exertional dyspnea and chest discomfort. Also has frequent symptomatic AF despite antiarrhythmic therapy. Here for coronary/graft angiography. This procedure has been fully reviewed with the patient and written informed consent has been obtained. Note problems with protracted elevation in ACT after procedure (Angiomax; >48 hours delay in sheath removal; no bleeding with Angioseal). Thurmon Fair, MD, [facility] [phone] office [phone] pager 02/08/2012 8:44 AM

## 2012-02-09 ENCOUNTER — Other Ambulatory Visit: Payer: Self-pay | Admitting: Physician Assistant

## 2012-02-19 ENCOUNTER — Encounter: Payer: Self-pay | Admitting: *Deleted

## 2012-02-22 ENCOUNTER — Ambulatory Visit: Payer: BC Managed Care – PPO | Admitting: Physician Assistant

## 2012-03-24 ENCOUNTER — Ambulatory Visit (INDEPENDENT_AMBULATORY_CARE_PROVIDER_SITE_OTHER): Payer: BC Managed Care – PPO | Admitting: Internal Medicine

## 2012-03-24 ENCOUNTER — Encounter: Payer: Self-pay | Admitting: Internal Medicine

## 2012-03-24 VITALS — BP 132/70 | HR 76 | Resp 18 | Ht 70.0 in | Wt 208.4 lb

## 2012-03-24 DIAGNOSIS — I2510 Atherosclerotic heart disease of native coronary artery without angina pectoris: Secondary | ICD-10-CM

## 2012-03-24 DIAGNOSIS — I4891 Unspecified atrial fibrillation: Secondary | ICD-10-CM

## 2012-03-24 DIAGNOSIS — I10 Essential (primary) hypertension: Secondary | ICD-10-CM

## 2012-03-24 DIAGNOSIS — I480 Paroxysmal atrial fibrillation: Secondary | ICD-10-CM

## 2012-03-24 NOTE — Assessment & Plan Note (Signed)
Stable. No changes today. 

## 2012-03-24 NOTE — Progress Notes (Signed)
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  He reports initially being diagnosed with atrial fibrillation 02/2009 after presenting to his primary care physician for routine exam. He underwent emergent cath which revealed severe CAD and therefore he had CABG.  He subsequently had an event monitor placed which revealed afib (22%) with tachy/brady syndrome.  He subsequently had a pacemaker implanted.  He reports symptoms of fatigue and "fluttering" during atrial fibrillation.  He also reports occasional SOB with decreased exercise tolerance.  He has failed medical therapy with sotalol and multaq.   He was then loaded on tikosyn 10/12 and required cardioversion at that time.  He underwent subsequent cath 10/17/11 and underwent PCI of Distal Left Main and the Left Circumflex with a Promus Element DES stent.  He had difficulty with persistently elevated ACT post cath and sheath removal was delayed.  He underwent repeat cath 5/13 with no intervention planned.  He had no difficulty with sheath removal at that time. He continues to have frequent episodes of atrial fibrillation.  He reports palpitations and fatigue.  He presents today to again consider catheter ablation for atrial fibrillation.  Past Medical History  Diagnosis Date  . Coronary artery disease     s/p CABG 2010, s/p Successful PCI  10/17/11 of Distal Left Main and the Left Circumflex with a Promus Element DES stent -- 4.0 mm x 16 mm postdilated to 4.12 mm   . Hypertension   . Anxiety   . DDD (degenerative disc disease)   . Atrial fibrillation     paroxysmal  . S/P coronary artery stent placement to distal Lt. main and Lt. Cx 10/17/11  . 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 Coagulopathy     persistent elevation of ACT during 10/17/11 hospitalization  . Atrial flutter    Past Surgical History  Procedure Date  . Coronary angioplasty 10/19/11  . Coronary artery bypass graft 2010  . Insert / replace / remove pacemaker 2/12    [employer]    Current Outpatient Prescriptions  Medication Sig Dispense Refill  . aspirin 81 MG tablet Take 81 mg by mouth daily.      Marland Kitchen atenolol (TENORMIN) 50 MG tablet Take 100 mg by mouth daily.       . clopidogrel (PLAVIX) 75 MG tablet Take 75 mg by mouth daily with breakfast.      . diphenhydramine-acetaminophen (TYLENOL PM) 25-500 MG TABS Take 2 tablets by mouth at bedtime as needed. For sleep      . dofetilide (TIKOSYN) 500 MCG capsule Take 500 mcg by mouth 2 (two) times daily.      . fish oil-omega-3 fatty acids 1000 MG capsule Take 2 g by mouth 2 (two) times daily.      Marland Kitchen lisinopril (PRINIVIL,ZESTRIL) 20 MG tablet TAKE ONE TABLET BY MOUTH EVERY DAY  90 tablet  0  . niacin (NIASPAN) 1000 MG CR tablet Take 1,000 mg by mouth at bedtime.      . potassium chloride (K-DUR,KLOR-CON) 10 MEQ tablet Take 10 mEq by mouth daily.      . pravastatin (PRAVACHOL) 40 MG tablet Take 40 mg by mouth daily.      Marland Kitchen testosterone (ANDROGEL) 50 MG/5GM GEL  Place 10 g onto the skin daily.       Marland Kitchen venlafaxine XR (EFFEXOR-XR) 150 MG 24 hr capsule Take 150 mg by mouth daily. Take with 75mg  tablet to equal a total of 225mg  daily      . venlafaxine XR (EFFEXOR-XR) 75 MG 24 hr capsule Take 75 mg by mouth daily. Take with 150mg  tablet to equal a total of 225mg  daily      . warfarin (COUMADIN) 5 MG tablet Take 5 mg by mouth daily. Take 5mg  on Sun and Fri, take 2.5mg  all other days      . DISCONTD: lisinopril (PRINIVIL,ZESTRIL) 20 MG tablet Take 20 mg by mouth daily.        No Known Allergies  History   Social History  . Marital Status: Married    Spouse Name: N/A    Number of Children: N/A  . Years of Education: N/A   Occupational History  .  Not on file.   Social History Main Topics  . Smoking status: Former Smoker -- 1 years    Types: Cigarettes    Quit date: 10/18/1978  . Smokeless tobacco: Not on file  . Alcohol Use: 0.6 oz/week    1 Cans of beer per week     rare  . Drug Use: No  . Sexually Active: Yes   Other Topics Concern  . Not on file   Social History Narrative   Pt lives in [place] with spouse.  2 grown daughters.  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    Family History  Problem Relation Age of Onset  . Coronary artery disease    . Stroke      ROS- All systems are reviewed and negative except as per the HPI above  Physical Exam: Filed Vitals:   03/24/12 1530  BP: 132/70  Pulse: 76  Resp: 18  Height: 5\' 10"  (1.778 m)  Weight: 208 lb 6.4 oz (94.53 kg)  SpO2: 96%    GEN- The patient is well appearing, alert and oriented x 3 today.   Head- normocephalic, atraumatic Eyes-  Sclera clear, conjunctiva pink Ears- hearing intact Oropharynx- clear Neck- supple, no JVP Lymph- no cervical lymphadenopathy Lungs- Clear to ausculation bilaterally, normal work of breathing Heart- Regular rate and rhythm, no murmurs, rubs or gallops, PMI not laterally displaced GI- soft, NT, ND, + BS Extremities- no clubbing, cyanosis, or edema MS- no significant deformity or atrophy Skin- no rash or lesion Psych- euthymic mood, full affect Neuro- strength and sensation are intact  EKG today reveals Atrial pacing at 75 bpm, PR 130, QRS 86, QTc 404,   Assessment and Plan:

## 2012-03-24 NOTE — Patient Instructions (Addendum)
You are tentatively scheduled for an ablation on August 8th with Dr. Johney Frame.  We will call you by the end of the week with all the details and arrangements.    You will need to have a TEE done by [facility] on August 7th. We will call you by the end of the week with all the details and arrangements.    You will need to start having weekly INR checks beginning this week.  Please make sure they know to send the results to Dr. Johney Frame.

## 2012-03-24 NOTE — Assessment & Plan Note (Signed)
The patient has symptomatic paroxysmal atrial fibrillation.  He has failed medical therapy with sotalol and multaq and continues to have intermittent afib with tikosyn.  He also carries a h/o atrial flutter.  Therapeutic strategies for afib and atrial flutter including medicine and ablation were discussed in detail with the patient today. Risk, benefits, and alternatives to EP study and radiofrequency ablation were also discussed in detail today. These risks include but are not limited to stroke, bleeding, vascular damage, tamponade, perforation, damage to the esophagus, lungs, and other structures, pulmonary vein stenosis, worsening renal function, and death. The patient understands these risk and wishes to proceed.  We will therefore proceed with catheter ablation at the next available time.  He will need weekly INRs in the interim. We will ask [facility] to assist with TEE on the day prior to ablation to exclude left atrial appendate thrombus.  If his symptoms resolve, then rate control may be a reasonable strategy for afib long term.  If he has recurrent symptomatic afib, then we will consider catheter ablation for afib and atrial flutter at that time. Given recent persistently elevated ACT, it may be beneficial to obtain a baseline ACT in the office before we proceed with catheter ablation.

## 2012-03-24 NOTE — Assessment & Plan Note (Signed)
Stable No change required today  

## 2012-03-26 ENCOUNTER — Other Ambulatory Visit: Payer: Self-pay | Admitting: *Deleted

## 2012-03-26 ENCOUNTER — Encounter: Payer: Self-pay | Admitting: *Deleted

## 2012-03-26 DIAGNOSIS — I4891 Unspecified atrial fibrillation: Secondary | ICD-10-CM

## 2012-03-27 ENCOUNTER — Encounter: Payer: Self-pay | Admitting: *Deleted

## 2012-04-10 ENCOUNTER — Other Ambulatory Visit: Payer: Self-pay | Admitting: Physician Assistant

## 2012-04-14 ENCOUNTER — Encounter (HOSPITAL_COMMUNITY): Payer: Self-pay | Admitting: Pharmacy Technician

## 2012-04-17 ENCOUNTER — Other Ambulatory Visit (INDEPENDENT_AMBULATORY_CARE_PROVIDER_SITE_OTHER): Payer: BC Managed Care – PPO

## 2012-04-17 DIAGNOSIS — I4891 Unspecified atrial fibrillation: Secondary | ICD-10-CM

## 2012-04-17 LAB — CBC WITH DIFFERENTIAL/PLATELET
Basophils Absolute: 0 10*3/uL (ref 0.0–0.1)
Basophils Relative: 0.2 % (ref 0.0–3.0)
Eosinophils Absolute: 0.2 10*3/uL (ref 0.0–0.7)
MCHC: 33.8 g/dL (ref 30.0–36.0)
MCV: 91.4 fl (ref 78.0–100.0)
Monocytes Absolute: 0.6 10*3/uL (ref 0.1–1.0)
Neutrophils Relative %: 62.5 % (ref 43.0–77.0)
Platelets: 162 10*3/uL (ref 150.0–400.0)
RBC: 5.59 Mil/uL (ref 4.22–5.81)
RDW: 15.3 % — ABNORMAL HIGH (ref 11.5–14.6)

## 2012-04-17 LAB — BASIC METABOLIC PANEL
BUN: 15 mg/dL (ref 6–23)
CO2: 31 mEq/L (ref 19–32)
Calcium: 9.3 mg/dL (ref 8.4–10.5)
Chloride: 103 mEq/L (ref 96–112)
Creatinine, Ser: 1 mg/dL (ref 0.4–1.5)
Glucose, Bld: 82 mg/dL (ref 70–99)

## 2012-04-17 LAB — APTT: aPTT: 74.4 s — ABNORMAL HIGH (ref 21.7–28.8)

## 2012-04-21 ENCOUNTER — Other Ambulatory Visit: Payer: Self-pay | Admitting: *Deleted

## 2012-04-21 DIAGNOSIS — I4891 Unspecified atrial fibrillation: Secondary | ICD-10-CM

## 2012-04-22 MED ORDER — SODIUM CHLORIDE 0.9 % IV SOLN: INTRAVENOUS | Status: DC

## 2012-04-23 ENCOUNTER — Encounter (HOSPITAL_COMMUNITY)
Admission: RE | Disposition: A | Discharge status: Home / Self Care | Payer: Self-pay | Source: Ambulatory Visit | Attending: Cardiovascular Disease

## 2012-04-23 ENCOUNTER — Other Ambulatory Visit: Payer: Self-pay | Admitting: *Deleted

## 2012-04-23 ENCOUNTER — Encounter (HOSPITAL_COMMUNITY): Payer: Self-pay | Admitting: *Deleted

## 2012-04-23 ENCOUNTER — Ambulatory Visit (HOSPITAL_COMMUNITY)
Admission: RE | Admit: 2012-04-23 | Discharge: 2012-04-23 | Disposition: A | Discharge status: Home / Self Care | Payer: BC Managed Care – PPO | Source: Ambulatory Visit | Attending: Cardiovascular Disease | Admitting: Cardiovascular Disease

## 2012-04-23 DIAGNOSIS — I059 Rheumatic mitral valve disease, unspecified: Secondary | ICD-10-CM | POA: Insufficient documentation

## 2012-04-23 DIAGNOSIS — I079 Rheumatic tricuspid valve disease, unspecified: Secondary | ICD-10-CM | POA: Insufficient documentation

## 2012-04-23 DIAGNOSIS — I4891 Unspecified atrial fibrillation: Secondary | ICD-10-CM

## 2012-04-23 HISTORY — PX: TEE WITHOUT CARDIOVERSION: SHX5443

## 2012-04-23 LAB — PROTIME-INR

## 2012-04-23 SURGERY — ECHOCARDIOGRAM, TRANSESOPHAGEAL
Anesthesia: Moderate Sedation

## 2012-04-23 MED ORDER — SODIUM CHLORIDE 0.9 % IV SOLN
Freq: Once | INTRAVENOUS | Status: AC
  Administered 2012-04-23: 500 mL via INTRAVENOUS

## 2012-04-23 MED ORDER — MIDAZOLAM HCL 10 MG/2ML IJ SOLN
INTRAMUSCULAR | Status: AC
  Filled 2012-04-23: qty 2

## 2012-04-23 MED ORDER — BUTAMBEN-TETRACAINE-BENZOCAINE 2-2-14 % EX AERO
INHALATION_SPRAY | CUTANEOUS | Status: DC | PRN
  Administered 2012-04-23 (×2): 2 via TOPICAL

## 2012-04-23 MED ORDER — FENTANYL CITRATE 0.05 MG/ML IJ SOLN
INTRAMUSCULAR | Status: DC | PRN
  Administered 2012-04-23 (×2): 25 ug via INTRAVENOUS

## 2012-04-23 MED ORDER — FENTANYL CITRATE 0.05 MG/ML IJ SOLN
INTRAMUSCULAR | Status: AC
  Filled 2012-04-23: qty 2

## 2012-04-23 MED ORDER — MIDAZOLAM HCL 10 MG/2ML IJ SOLN
INTRAMUSCULAR | Status: DC | PRN
  Administered 2012-04-23: 3 mg via INTRAVENOUS
  Administered 2012-04-23 (×2): 2 mg via INTRAVENOUS

## 2012-04-23 NOTE — H&P (View-Only) (Signed)
Primary Care Physician: RICHTER,KAREN L., MD Referring Physician:  Dr Croituro   Lucas Porter is a 56 y.o. male with a h/o CAD s/p CABG and subsequent PCI, paroxysmal atrial fibrillation, and tachycardia/ bradycardia syndrome s/p PPM implant who presents today for EP followup regarding his afib.  He reports initially being diagnosed with atrial fibrillation 02/2009 after presenting to his primary care physician for routine exam. He underwent emergent cath which revealed severe CAD and therefore he had CABG.  He subsequently had an event monitor placed which revealed afib (22%) with tachy/brady syndrome.  He subsequently had a pacemaker implanted.  He reports symptoms of fatigue and "fluttering" during atrial fibrillation.  He also reports occasional SOB with decreased exercise tolerance.  He has failed medical therapy with sotalol and multaq.   He was then loaded on tikosyn 10/12 and required cardioversion at that time.  He underwent subsequent cath 10/17/11 and underwent PCI of Distal Left Main and the Left Circumflex with a Promus Element DES stent.  He had difficulty with persistently elevated ACT post cath and sheath removal was delayed.  He underwent repeat cath 5/13 with no intervention planned.  He had no difficulty with sheath removal at that time. He continues to have frequent episodes of atrial fibrillation.  He reports palpitations and fatigue.  He presents today to again consider catheter ablation for atrial fibrillation.  Past Medical History  Diagnosis Date  . Coronary artery disease     s/p CABG 2010, s/p Successful PCI  10/17/11 of Distal Left Main and the Left Circumflex with a Promus Element DES stent -- 4.0 mm x 16 mm postdilated to 4.12 mm   . Hypertension   . Anxiety   . DDD (degenerative disc disease)   . Atrial fibrillation     paroxysmal  . S/P coronary artery stent placement to distal Lt. main and Lt. Cx 10/17/11  . 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 Coagulopathy     persistent elevation of ACT during 10/17/11 hospitalization  . Atrial flutter    Past Surgical History  Procedure Date  . Coronary angioplasty 10/19/11  . Coronary artery bypass graft 2010  . Insert / replace / remove pacemaker 2/12    Medtronic    Current Outpatient Prescriptions  Medication Sig Dispense Refill  . aspirin 81 MG tablet Take 81 mg by mouth daily.      . atenolol (TENORMIN) 50 MG tablet Take 100 mg by mouth daily.       . clopidogrel (PLAVIX) 75 MG tablet Take 75 mg by mouth daily with breakfast.      . diphenhydramine-acetaminophen (TYLENOL PM) 25-500 MG TABS Take 2 tablets by mouth at bedtime as needed. For sleep      . dofetilide (TIKOSYN) 500 MCG capsule Take 500 mcg by mouth 2 (two) times daily.      . fish oil-omega-3 fatty acids 1000 MG capsule Take 2 g by mouth 2 (two) times daily.      . lisinopril (PRINIVIL,ZESTRIL) 20 MG tablet TAKE ONE TABLET BY MOUTH EVERY DAY  90 tablet  0  . niacin (NIASPAN) 1000 MG CR tablet Take 1,000 mg by mouth at bedtime.      . potassium chloride (K-DUR,KLOR-CON) 10 MEQ tablet Take 10 mEq by mouth daily.      . pravastatin (PRAVACHOL) 40 MG tablet Take 40 mg by mouth daily.      . testosterone (ANDROGEL) 50 MG/5GM GEL   Place 10 g onto the skin daily.       . venlafaxine XR (EFFEXOR-XR) 150 MG 24 hr capsule Take 150 mg by mouth daily. Take with 75mg tablet to equal a total of 225mg daily      . venlafaxine XR (EFFEXOR-XR) 75 MG 24 hr capsule Take 75 mg by mouth daily. Take with 150mg tablet to equal a total of 225mg daily      . warfarin (COUMADIN) 5 MG tablet Take 5 mg by mouth daily. Take 5mg on Sun and Fri, take 2.5mg all other days      . DISCONTD: lisinopril (PRINIVIL,ZESTRIL) 20 MG tablet Take 20 mg by mouth daily.        No Known Allergies  History   Social History  . Marital Status: Married    Spouse Name: N/A    Number of Children: N/A  . Years of Education: N/A   Occupational History  .  Not on file.   Social History Main Topics  . Smoking status: Former Smoker -- 1 years    Types: Cigarettes    Quit date: 10/18/1978  . Smokeless tobacco: Not on file  . Alcohol Use: 0.6 oz/week    1 Cans of beer per week     rare  . Drug Use: No  . Sexually Active: Yes   Other Topics Concern  . Not on file   Social History Narrative   Pt lives in [place] with spouse.  2 grown daughters.  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    Family History  Problem Relation Age of Onset  . Coronary artery disease    . Stroke      ROS- All systems are reviewed and negative except as per the HPI above  Physical Exam: Filed Vitals:   03/24/12 1530  BP: 132/70  Pulse: 76  Resp: 18  Height: 5' 10" (1.778 m)  Weight: 208 lb 6.4 oz (94.53 kg)  SpO2: 96%    GEN- The patient is well appearing, alert and oriented x 3 today.   Head- normocephalic, atraumatic Eyes-  Sclera clear, conjunctiva pink Ears- hearing intact Oropharynx- clear Neck- supple, no JVP Lymph- no cervical lymphadenopathy Lungs- Clear to ausculation bilaterally, normal work of breathing Heart- Regular rate and rhythm, no murmurs, rubs or gallops, PMI not laterally displaced GI- soft, NT, ND, + BS Extremities- no clubbing, cyanosis, or edema MS- no significant deformity or atrophy Skin- no rash or lesion Psych- euthymic mood, full affect Neuro- strength and sensation are intact  EKG today reveals Atrial pacing at 75 bpm, PR 130, QRS 86, QTc 404,   Assessment and Plan:  

## 2012-04-23 NOTE — Progress Notes (Signed)
  Echocardiogram Echocardiogram Transesophageal has been performed.  [place]ian Co 04/23/2012, 12:59 PM

## 2012-04-23 NOTE — CV Procedure (Signed)
See full note in [employer] No SOE No LAA thrombus Ok for ablation in am  [employer]

## 2012-04-23 NOTE — Interval H&P Note (Signed)
History and Physical Interval Note:  04/23/2012 12:37 PM  Lucas Porter  has presented today for surgery, with the diagnosis of a-fib  The various methods of treatment have been discussed with the patient and family. After consideration of risks, benefits and other options for treatment, the patient has consented to  Procedure(s) (LRB): TRANSESOPHAGEAL ECHOCARDIOGRAM (TEE) (N/A) as a surgical intervention .  The patient's history has been reviewed, patient examined, no change in status, stable for surgery.  I have reviewed the patient's chart and labs.  Questions were answered to the patient's satisfaction.     Charlton Haws

## 2012-04-24 ENCOUNTER — Ambulatory Visit (HOSPITAL_COMMUNITY): Payer: BC Managed Care – PPO | Admitting: Anesthesiology

## 2012-04-24 ENCOUNTER — Encounter (HOSPITAL_COMMUNITY): Payer: Self-pay | Admitting: Anesthesiology

## 2012-04-24 ENCOUNTER — Ambulatory Visit (HOSPITAL_COMMUNITY)
Admission: RE | Admit: 2012-04-24 | Discharge: 2012-04-25 | Disposition: A | Discharge status: Home / Self Care | Payer: BC Managed Care – PPO | Source: Ambulatory Visit | Attending: Internal Medicine | Admitting: Internal Medicine

## 2012-04-24 ENCOUNTER — Encounter (HOSPITAL_COMMUNITY): Payer: Self-pay

## 2012-04-24 ENCOUNTER — Encounter (HOSPITAL_COMMUNITY)
Admission: RE | Disposition: A | Discharge status: Home / Self Care | Payer: Self-pay | Source: Ambulatory Visit | Attending: Internal Medicine

## 2012-04-24 DIAGNOSIS — I4892 Unspecified atrial flutter: Secondary | ICD-10-CM

## 2012-04-24 DIAGNOSIS — E785 Hyperlipidemia, unspecified: Secondary | ICD-10-CM

## 2012-04-24 DIAGNOSIS — I10 Essential (primary) hypertension: Secondary | ICD-10-CM | POA: Insufficient documentation

## 2012-04-24 DIAGNOSIS — Z955 Presence of coronary angioplasty implant and graft: Secondary | ICD-10-CM

## 2012-04-24 DIAGNOSIS — I2510 Atherosclerotic heart disease of native coronary artery without angina pectoris: Secondary | ICD-10-CM | POA: Diagnosis present

## 2012-04-24 DIAGNOSIS — I4891 Unspecified atrial fibrillation: Secondary | ICD-10-CM

## 2012-04-24 DIAGNOSIS — I480 Paroxysmal atrial fibrillation: Secondary | ICD-10-CM | POA: Diagnosis present

## 2012-04-24 DIAGNOSIS — Z951 Presence of aortocoronary bypass graft: Secondary | ICD-10-CM | POA: Insufficient documentation

## 2012-04-24 HISTORY — PX: ATRIAL FIBRILLATION ABLATION: SHX5456

## 2012-04-24 LAB — POCT ACTIVATED CLOTTING TIME
Activated Clotting Time: 399 seconds
Activated Clotting Time: 369 seconds
Activated Clotting Time: 374 seconds
Activated Clotting Time: 334 seconds

## 2012-04-24 LAB — MRSA PCR SCREENING: MRSA by PCR: NEGATIVE

## 2012-04-24 LAB — PROTIME-INR: INR: 1.51 — ABNORMAL HIGH (ref 0.00–1.49)

## 2012-04-24 SURGERY — ATRIAL FIBRILLATION ABLATION
Anesthesia: Monitor Anesthesia Care

## 2012-04-24 MED ORDER — ACETAMINOPHEN 325 MG PO TABS: 650.0000 mg | ORAL_TABLET | ORAL | Status: DC | PRN

## 2012-04-24 MED ORDER — PROTAMINE SULFATE 10 MG/ML IV SOLN
INTRAVENOUS | Status: DC | PRN
  Administered 2012-04-24: 50 mg via INTRAVENOUS

## 2012-04-24 MED ORDER — HYDRALAZINE HCL 20 MG/ML IJ SOLN
INTRAMUSCULAR | Status: DC | PRN
Start: 2012-04-24 — End: 2012-04-24
  Administered 2012-04-24 (×2): 5 mg via INTRAVENOUS

## 2012-04-24 MED ORDER — SODIUM CHLORIDE 0.9 % IJ SOLN: 3.0000 mL | INTRAMUSCULAR | Status: DC | PRN

## 2012-04-24 MED ORDER — MIDAZOLAM HCL 5 MG/5ML IJ SOLN
INTRAMUSCULAR | Status: DC | PRN
  Administered 2012-04-24: 2 mg via INTRAVENOUS

## 2012-04-24 MED ORDER — WARFARIN SODIUM 7.5 MG PO TABS
7.5000 mg | ORAL_TABLET | Freq: Once | ORAL | Status: AC
  Administered 2012-04-24: 7.5 mg via ORAL
  Filled 2012-04-24: qty 1

## 2012-04-24 MED ORDER — LIDOCAINE HCL (CARDIAC) 20 MG/ML IV SOLN
INTRAVENOUS | Status: DC | PRN
  Administered 2012-04-24: 50 mg via INTRAVENOUS

## 2012-04-24 MED ORDER — DOFETILIDE 500 MCG PO CAPS
500.0000 ug | ORAL_CAPSULE | Freq: Two times a day (BID) | ORAL | Status: DC
  Administered 2012-04-24: 500 ug via ORAL
  Filled 2012-04-24 (×4): qty 1

## 2012-04-24 MED ORDER — POTASSIUM CHLORIDE CRYS ER 10 MEQ PO TBCR
10.0000 meq | EXTENDED_RELEASE_TABLET | Freq: Every day | ORAL | Status: DC
  Administered 2012-04-24: 10 meq via ORAL
  Filled 2012-04-24 (×2): qty 1

## 2012-04-24 MED ORDER — PROTAMINE SULFATE 10 MG/ML IV SOLN
50.0000 mg | Freq: Once | INTRAVENOUS | Status: AC
  Administered 2012-04-24: 30 mg via INTRAVENOUS

## 2012-04-24 MED ORDER — VENLAFAXINE HCL ER 150 MG PO CP24: 150.0000 mg | ORAL_CAPSULE | Freq: Every day | ORAL | Status: DC

## 2012-04-24 MED ORDER — VENLAFAXINE HCL ER 75 MG PO CP24
225.0000 mg | ORAL_CAPSULE | Freq: Every day | ORAL | Status: DC
  Administered 2012-04-24: 225 mg via ORAL
  Filled 2012-04-24 (×2): qty 3

## 2012-04-24 MED ORDER — SODIUM CHLORIDE 0.9 % IV SOLN
INTRAVENOUS | Status: DC | PRN
  Administered 2012-04-24: 07:00:00 via INTRAVENOUS

## 2012-04-24 MED ORDER — DIPHENHYDRAMINE HCL 25 MG PO CAPS: 50.0000 mg | ORAL_CAPSULE | Freq: Every evening | ORAL | Status: DC | PRN

## 2012-04-24 MED ORDER — ONDANSETRON HCL 4 MG/2ML IJ SOLN
4.0000 mg | Freq: Four times a day (QID) | INTRAMUSCULAR | Status: DC | PRN
  Administered 2012-04-24: 4 mg via INTRAVENOUS

## 2012-04-24 MED ORDER — WARFARIN - PHYSICIAN DOSING INPATIENT: Freq: Every day | Status: DC

## 2012-04-24 MED ORDER — SODIUM CHLORIDE 0.9 % IJ SOLN
3.0000 mL | Freq: Two times a day (BID) | INTRAMUSCULAR | Status: DC
  Administered 2012-04-24: 3 mL via INTRAVENOUS

## 2012-04-24 MED ORDER — FENTANYL CITRATE 0.05 MG/ML IJ SOLN
INTRAMUSCULAR | Status: DC | PRN
  Administered 2012-04-24: 50 ug via INTRAVENOUS
  Administered 2012-04-24: 100 ug via INTRAVENOUS
  Administered 2012-04-24 (×2): 25 ug via INTRAVENOUS

## 2012-04-24 MED ORDER — DIPHENHYDRAMINE-APAP (SLEEP) 25-500 MG PO TABS: 2.0000 | ORAL_TABLET | Freq: Every evening | ORAL | Status: DC | PRN

## 2012-04-24 MED ORDER — PROTAMINE SULFATE 10 MG/ML IV SOLN
INTRAVENOUS | Status: AC
  Filled 2012-04-24: qty 5

## 2012-04-24 MED ORDER — ONDANSETRON HCL 4 MG/2ML IJ SOLN
INTRAMUSCULAR | Status: AC
  Filled 2012-04-24: qty 2

## 2012-04-24 MED ORDER — PROPOFOL 10 MG/ML IV EMUL
INTRAVENOUS | Status: DC | PRN
  Administered 2012-04-24: 50 ug/kg/min via INTRAVENOUS

## 2012-04-24 MED ORDER — ACETAMINOPHEN 500 MG PO TABS: 1000.0000 mg | ORAL_TABLET | Freq: Every evening | ORAL | Status: DC | PRN

## 2012-04-24 MED ORDER — TESTOSTERONE 50 MG/5GM (1%) TD GEL
10.0000 g | Freq: Every day | TRANSDERMAL | Status: DC
Start: 2012-04-24 — End: 2012-04-25

## 2012-04-24 MED ORDER — CLOPIDOGREL BISULFATE 75 MG PO TABS
75.0000 mg | ORAL_TABLET | Freq: Every day | ORAL | Status: DC
  Administered 2012-04-25: 75 mg via ORAL
  Filled 2012-04-24 (×2): qty 1

## 2012-04-24 MED ORDER — PROPOFOL 10 MG/ML IV EMUL
INTRAVENOUS | Status: DC | PRN
  Administered 2012-04-24: 50 mg via INTRAVENOUS
  Administered 2012-04-24: 30 mg via INTRAVENOUS

## 2012-04-24 MED ORDER — HEPARIN SODIUM (PORCINE) 1000 UNIT/ML IJ SOLN
INTRAMUSCULAR | Status: AC
  Filled 2012-04-24: qty 1

## 2012-04-24 MED ORDER — ATENOLOL 100 MG PO TABS
100.0000 mg | ORAL_TABLET | Freq: Every day | ORAL | Status: DC
  Administered 2012-04-24: 100 mg via ORAL
  Filled 2012-04-24 (×2): qty 1

## 2012-04-24 MED ORDER — BUPIVACAINE HCL (PF) 0.25 % IJ SOLN
INTRAMUSCULAR | Status: AC
  Filled 2012-04-24: qty 30

## 2012-04-24 MED ORDER — LISINOPRIL 20 MG PO TABS
20.0000 mg | ORAL_TABLET | Freq: Every day | ORAL | Status: DC
  Administered 2012-04-24: 20 mg via ORAL
  Filled 2012-04-24 (×2): qty 1

## 2012-04-24 MED ORDER — SODIUM CHLORIDE 0.9 % IV SOLN: 250.0000 mL | INTRAVENOUS | Status: DC | PRN

## 2012-04-24 MED ORDER — HYDROCODONE-ACETAMINOPHEN 5-325 MG PO TABS
1.0000 | ORAL_TABLET | ORAL | Status: DC | PRN
  Administered 2012-04-24: 2 via ORAL
  Administered 2012-04-24: 1 via ORAL
  Filled 2012-04-24: qty 1
  Filled 2012-04-24: qty 2

## 2012-04-24 NOTE — Op Note (Signed)
SURGEON:  Hillis Range, MD  PREPROCEDURE DIAGNOSES: 1. Paroxysmal atrial fibrillation. 2. Typical appearing atrial flutter  POSTPROCEDURE DIAGNOSES: 1. Paroxysmal  atrial fibrillation. 2. Typical appearing atrial flutter  PROCEDURES: 1. Comprehensive electrophysiologic study. 2. Coronary sinus pacing and recording. 3. Three-dimensional mapping of atrial fibrillation with additional mapping of atrial flutter (right atrial discrete focus) 4. Ablation of atrial fibrillation with additional ablation of atrial flutter (right atrial discrete focus) 5. Intracardiac echocardiography. 6 Transseptal puncture of an intact septum. 7  Rotational Angiography with processing at an independent workstation 8. Arrhythmia induction with pacing  9. Pacemaker interrogation and reprogramming  INTRODUCTION:  Lucas Porter is a 56 y.o. male with a history of paroxysmal atrial fibrillation and typical appearing atrial flutter who now presents for EP study and radiofrequency ablation.  The patient reports initially being diagnosed with atrial fibrillation after presenting with symptomatic palpitations and fatgiue. The patient reports increasing frequency and duration of atrial arrhythmias since that time.  The patient has failed medical therapy with Joice Lofts.  The patient therefore presents today for catheter ablation of atrial fibrillation and atrial flutter.  DESCRIPTION OF PROCEDURE:  Informed written consent was obtained, and the patient was brought to the electrophysiology lab in a fasting state.  The patient was adequately sedated with intravenous medications as outlined in the anesthesia report.  The patient's right groin was prepped and draped in the usual sterile fashion by the EP lab staff.  Using a percutaneous Seldinger technique, two 7-French and one 8-French hemostasis sheaths were placed into the right common femoral vein.   The patient's pacemaker was interrogated at the beginning of the procedure and  baseline measurements were noted.  3 Dimensional Rotational Angiography: A 5 french pigtail catheter was introduced through the right common femoral vein and advanced into the inferior venocava.  3 demential rotational angiography was then performed by power injection of 100cc of nonionic contrast.  Reprocessing at an independent work station was then performed.   This demonstrated a moderate sized left atrium with 4 separate pulmonary veins.  The left pulmonary veins were moderate in size. The right superior pulmonary vein was large and then right inferior pulmonary vein was small in size.  There were no anomalous veins or significant abnormalities.  A 3 dimensional rendering of the left atrium was then merged using [employer] onto the WellPoint system and registered with intracardiac echo (see below).  The pigtail catheter was then removed.  Catheter Placement:  A 7-French Biosense Webster Decapolar coronary sinus catheter was introduced through the right common femoral vein and advanced into the coronary sinus for recording and pacing from this location.  A 6-French quadripolar Josephson catheter was introduced through the right common femoral vein and advanced into the right ventricle for recording and pacing.  This catheter was then pulled back to the His bundle location.    Initial Measurements: The patient presented to the electrophysiology lab in sinus rhythm.  His PR interval measured with a QRS duringation of 106 and a QT interval of 400 msec.  The AH interval measured 104 and the HV interval measured 56 msec.     Intracardiac Echocardiography: A 10-French Biosense Webster AcuNav intracardiac echocardiography catheter was introduced through the left common femoral vein and advanced into the right atrium. Intracardiac echocardiography was performed of the left atrium, and a three-dimensional anatomical rendering of the left atrium was performed using CARTO sound  technology.  The patient was noted to have a moderate sized left  atrium.  The interatrial septum was prominent but not aneurysmal. All 4 pulmonary veins were visualized and noted to have separate ostia.  The left pulmonary veins were moderate in size. The right superior pulmonary vein was large and then right inferior pulmonary vein was small in size.  The left atrial appendage was visualized and did not reveal thrombus.   There was no evidence of pulmonary vein stenosis.   Transseptal Puncture: The middle right common femoral vein sheath was exchanged for an 8.5 [place] SL2 transseptal sheath and transseptal access was achieved in a standard fashion using a Brockenbrough needle under biplane fluoroscopy with intracardiac echocardiography confirmation of the transseptal puncture.  Once transseptal access had been achieved, heparin was administered intravenously and intra- arterially in order to maintain an ACT of greater than 350 seconds throughout the procedure.   3D Mapping and Ablation: The His bundle catheter was removed and in its place a 3.5 mm Biosense Webster EZ [employer] ablation catheter was advanced into the right atrium.  The transseptal sheath was pulled back into the IVC over a guidewire.  The ablation catheter was advanced across the transseptal hole using the wire as a guide.  The transseptal sheath was then re-advanced over the guidewire into the left atrium.  A duodecapolar Biosense Webster circular mapping catheter was introduced through the transseptal sheath and positioned over the mouth of all 4 pulmonary veins.  Three-dimensional electroanatomical mapping was performed using CARTO technology.  This demonstrated electrical activity within all four pulmonary veins at baseline. The patient underwent successful sequential electrical isolation and anatomical encircling of all four pulmonary veins using radiofrequency current with a circular mapping catheter as a guide.  The ablation  catheter was then pulled back into the right atrial and positioned along the cavo-tricuspid isthmus.  Mapping along the atrial side of the isthmus was performed.  This demonstrated a standard isthmus.  A series of radiofrequency applications were then delivered along the isthmus.  Complete bidirectional cavotricuspid isthmus block was achieved as confirmed by differential atrial pacing from the low lateral right atrium.  A stimulus to earliest atrial activation across the isthmus measured 150 msec bi-directionally.  The patient was observe without return of conduction through the isthmus.  Because of his pacemaker leads, I did not place a halo catheter today.  Measurements Following Ablation: Following ablation, Isuprel was not infused due to known extensive CAD. In sinus rhythm with RR interval was 870, with PR , QRS 84 msec, and Qtc 400 msec.  Ventricular pacing was performed, which revealed VA dissociation when pacing at 600 msec.  Rapid atrial pacing was performed, which revealed an AV Wenckebach cycle length of 420 msec. No arrhythmias were induced with RAP down to a cycle length of .  Electroisolation was then again confirmed in all four pulmonary veins.  The procedure was therefore considered completed.  All catheters were removed, and the sheaths were aspirated and flushed.  The patient was transferred to the recovery area for sheath removal per protocol.  A limited bedside transthoracic echocardiogram revealed no pericardial effusion.  There were no early apparent complications.  The patient's pacemaker was again interrogated and reprogrammed MVP 60/120.  All lead measurements were confirmed to be unchanged when compared to the measurements before the case.  CONCLUSIONS: 1. Sinus rhythm upon presentation.   2. Rotational Angiography reveals a moderate sized left atrium with four separate pulmonary veins without evidence of pulmonary vein stenosis. 3. Successful electrical isolation  and anatomical encircling of all  four pulmonary veins with radiofrequency current. 4. Cavo-tricuspid isthmus ablation was performed with complete bidirectional isthmus block achieved.  5. No inducible arrhythmias following ablation 6. No early apparent complications.   Damel Querry,MD 10:41 AM 04/24/2012

## 2012-04-24 NOTE — Preoperative (Signed)
Beta Blockers   Reason not to administer Beta Blockers:Hold beta blocker due to other. Patient instructed not to take Atenolol prior to procedure

## 2012-04-24 NOTE — Transfer of Care (Signed)
Immediate Anesthesia Transfer of Care Note  Patient: Lucas Porter  Procedure(s) Performed: Procedure(s) (LRB): ATRIAL FIBRILLATION ABLATION (N/A)  Patient Location: Cath Lab  Anesthesia Type: MAC  Level of Consciousness: awake, alert  and oriented  Airway & Oxygen Therapy: Patient Spontanous Breathing  Post-op Assessment: Report given to PACU RN and Post -op Vital signs reviewed and stable  Post vital signs: Reviewed and stable  Complications: No apparent anesthesia complications

## 2012-04-24 NOTE — Anesthesia Postprocedure Evaluation (Signed)
  Anesthesia Post-op Note  Patient: Lucas Porter  Procedure(s) Performed: Procedure(s) (LRB): ATRIAL FIBRILLATION ABLATION (N/A)  Patient Location: PACU and Cath Lab  Anesthesia Type: MAC  Level of Consciousness: awake  Airway and Oxygen Therapy: Patient Spontanous Breathing  Post-op Pain: none  Post-op Assessment: Post-op Vital signs reviewed, Patient's Cardiovascular Status Stable, Respiratory Function Stable, Patent Airway, No signs of Nausea or vomiting and Pain level controlled  Post-op Vital Signs: stable  Complications: No apparent anesthesia complications

## 2012-04-24 NOTE — Anesthesia Preprocedure Evaluation (Addendum)
Anesthesia Evaluation  Patient identified by MRN, date of birth, ID band Patient awake    Reviewed: Allergy & Precautions, H&P , NPO status , Patient's Chart, lab work & pertinent test results, reviewed documented beta blocker date and time   Airway Mallampati: I TM Distance: >3 FB Neck ROM: Full    Dental  (+) Teeth Intact and Dental Advisory Given   Pulmonary    Pulmonary exam normal       Cardiovascular hypertension, Pt. on medications and Pt. on home beta blockers + angina + CAD, + Cardiac Stents and + CABG + dysrhythmias Atrial Fibrillation Rhythm:Regular Rate:Normal     Neuro/Psych Anxiety    GI/Hepatic   Endo/Other    Renal/GU      Musculoskeletal   Abdominal   Peds  Hematology   Anesthesia Other Findings   Reproductive/Obstetrics                          Anesthesia Physical Anesthesia Plan  ASA: III  Anesthesia Plan: MAC   Post-op Pain Management:    Induction: Intravenous  Airway Management Planned: Mask  Additional Equipment:   Intra-op Plan:   Post-operative Plan:   Informed Consent: I have reviewed the patients History and Physical, chart, labs and discussed the procedure including the risks, benefits and alternatives for the proposed anesthesia with the patient or authorized representative who has indicated his/her understanding and acceptance.   Dental advisory given  Plan Discussed with: CRNA and Surgeon  Anesthesia Plan Comments:        Anesthesia Quick Evaluation

## 2012-04-24 NOTE — H&P (Signed)
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 He reports initially being diagnosed with atrial fibrillation 02/2009 after presenting to his primary care physician for routine exam. He underwent emergent cath which revealed severe CAD and therefore he had CABG. He subsequently had an event monitor placed which revealed afib (22%) with tachy/brady syndrome. He subsequently had a pacemaker implanted. He reports symptoms of fatigue and "fluttering" during atrial fibrillation. He also reports occasional SOB with decreased exercise tolerance. He has failed medical therapy with sotalol and multaq.   He was then loaded on tikosyn 10/12 and required cardioversion at that time. He underwent subsequent cath 10/17/11 and underwent PCI of Distal Left Main and the Left Circumflex with a Promus Element DES stent. He had difficulty with persistently elevated ACT post cath and sheath removal was delayed. He underwent repeat cath 5/13 with no intervention planned. He had no difficulty with sheath removal at that time.   He continues to have frequent episodes of atrial fibrillation. He reports palpitations and fatigue. He presents today to again consider catheter ablation for atrial fibrillation.   Past Medical History    Diagnosis  Date   .  Coronary artery disease      s/p CABG 2010, s/p Successful PCI 10/17/11 of Distal Left Main and the Left Circumflex with a Promus Element DES stent -- 4.0 mm x 16 mm postdilated to 4.12 mm   .  Hypertension    .  Anxiety    .  DDD (degenerative disc disease)    .  Atrial fibrillation      paroxysmal   .  S/P coronary artery stent placement to distal Lt. main and Lt. Cx  10/17/11   .  Tachycardia-bradycardia syndrome  2/12    s/p PPM (MDT) by Dr Fredrich Birks   .  Coagulopathy      persistent elevation of ACT during 10/17/11 hospitalization   .  Atrial flutter     Past Surgical History   Procedure  Date   .  Coronary angioplasty  10/19/11   .  Coronary artery bypass graft  2010   .  Insert / replace / remove pacemaker  2/12     Medtronic    Current Outpatient Prescriptions   Medication  Sig  Dispense  Refill   .  aspirin 81 MG tablet  Take 81 mg by mouth daily.     Marland Kitchen  atenolol (TENORMIN) 50 MG tablet  Take 100 mg by mouth daily.     .  clopidogrel (PLAVIX) 75 MG tablet  Take 75 mg by mouth daily with breakfast.     .  diphenhydramine-acetaminophen (TYLENOL PM) 25-500 MG TABS  Take 2 tablets by mouth at bedtime as needed. For sleep     .  dofetilide (TIKOSYN) 500 MCG capsule  Take 500 mcg by mouth 2 (two) times daily.     .  fish oil-omega-3 fatty acids 1000 MG capsule  Take 2 g by mouth 2 (two) times daily.     Marland Kitchen  lisinopril (PRINIVIL,ZESTRIL) 20 MG tablet  TAKE ONE TABLET BY MOUTH EVERY DAY  90 tablet  0   .  niacin (NIASPAN) 1000 MG CR tablet  Take 1,000 mg by mouth at bedtime.     Marland Kitchen  potassium chloride (K-DUR,KLOR-CON) 10 MEQ tablet  Take 10 mEq by mouth daily.     .  pravastatin (PRAVACHOL) 40 MG tablet  Take 40 mg by mouth daily.     Marland Kitchen  testosterone (ANDROGEL) 50 MG/5GM GEL  Place 10 g onto the skin daily.     Marland Kitchen  venlafaxine XR (EFFEXOR-XR) 150 MG 24 hr capsule  Take 150 mg by mouth daily. Take with 75mg  tablet to equal a total of 225mg  daily     .  venlafaxine XR (EFFEXOR-XR) 75 MG 24 hr capsule  Take 75 mg by mouth daily. Take with 150mg  tablet to equal a total of 225mg  daily     .  warfarin (COUMADIN) 5 MG tablet  Take 5 mg by mouth daily. Take 5mg  on Sun and Fri, take 2.5mg  all other days     .  DISCONTD: lisinopril (PRINIVIL,ZESTRIL) 20 MG tablet  Take 20 mg by mouth daily.      No Known Allergies  History    Social History   .  Marital Status:  Married     Spouse Name:  N/A     Number of Children:   N/A   .  Years of Education:  N/A    Occupational History   .  Not on file.    Social History Main Topics   .  Smoking status:  Former Smoker -- 1 years     Types:  Cigarettes     Quit date:  10/18/1978   .  Smokeless tobacco:  Not on file   .  Alcohol Use:  0.6 oz/week     1 Cans of beer per week      rare   .  Drug Use:  No   .  Sexually Active:  Yes    Other Topics  Concern   .  Not on file    Social History Narrative    Pt lives in [place] with spouse. 2 grown daughters. 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    Family History   Problem  Relation  Age of Onset   .  Coronary artery disease     .  Stroke      ROS- All systems are reviewed and negative except as per the HPI above  Physical Exam:  Filed Vitals:    03/24/12 1530   BP:  132/70   Pulse:  76   Resp:  18   Height:  5\' 10"  (1.778 m)   Weight:  208 lb 6.4 oz (94.53 kg)   SpO2:  96%    GEN- The patient is well appearing, alert and oriented x 3 today.  Head- normocephalic, atraumatic  Eyes- Sclera clear, conjunctiva pink  Ears- hearing intact  Oropharynx- clear  Neck- supple, no JVP  Lymph- no cervical lymphadenopathy  Lungs- Clear to ausculation bilaterally, normal work of breathing  Heart- Regular rate and rhythm, no murmurs, rubs or gallops, PMI not laterally displaced  GI- soft, NT, ND, + BS  Extremities- no clubbing, cyanosis, or edema  MS- no significant deformity or atrophy  Skin- no rash or lesion  Psych- euthymic mood, full affect  Neuro- strength and sensation are intact   EKG today reveals sinus rhythm  Assessment and Plan:   Paroxysmal atrial fibrillation - Hillis Range, MD   The patient has symptomatic paroxysmal atrial fibrillation. He has failed medical therapy with sotalol and multaq and continues to have intermittent afib with tikosyn. He also carries a h/o atrial  flutter. Therapeutic strategies for afib and atrial flutter including medicine and ablation were discussed in detail with  the patient today. Risk, benefits, and alternatives to EP study and radiofrequency ablation were also discussed in detail today. These risks include but are not limited to stroke, bleeding, vascular damage, tamponade, perforation, damage to the esophagus, lungs, and other structures, pulmonary vein stenosis, worsening renal function, and death. The patient understands these risk and wishes to proceed.   LAA without evidence of thrombus by TEE.

## 2012-04-24 NOTE — Progress Notes (Signed)
Pt got up from bedrest and proceeded to go to the bathroom. Upon coming out of the bathroom pt had noticed that he had blood trickling to the floor from his groin site. Had pt to lay down on the bed and applied pressure for 15 min and then applied a pressure dressing. Pt vitals were stable at 117/64  HR 70s and a repeat 30 min post occurrence and his vitals were  121/69 HR 70s. MD notified. We will continue to monitor the pt for any further bleed and vitals.

## 2012-04-25 DIAGNOSIS — I4891 Unspecified atrial fibrillation: Secondary | ICD-10-CM

## 2012-04-25 DIAGNOSIS — I2510 Atherosclerotic heart disease of native coronary artery without angina pectoris: Secondary | ICD-10-CM

## 2012-04-25 LAB — BASIC METABOLIC PANEL
BUN: 13 mg/dL (ref 6–23)
Chloride: 105 mEq/L (ref 96–112)
GFR calc Af Amer: >90 mL/min (ref >90)
GFR calc non Af Amer: >90 mL/min (ref >90)
Glucose, Bld: 109 mg/dL — ABNORMAL HIGH (ref 70–99)
Potassium: 4.1 mEq/L (ref 3.5–5.1)
Sodium: 139 mEq/L (ref 135–145)

## 2012-04-25 LAB — PROTIME-INR: Prothrombin Time: 18.5 seconds — ABNORMAL HIGH (ref 11.6–15.2)

## 2012-04-25 MED ORDER — PANTOPRAZOLE SODIUM 40 MG PO TBEC: 40.0000 mg | DELAYED_RELEASE_TABLET | Freq: Every day | ORAL | Status: DC

## 2012-04-25 MED ORDER — WARFARIN SODIUM 5 MG PO TABS: 2.5000 mg | ORAL_TABLET | Freq: Every day | ORAL | Status: DC

## 2012-04-25 NOTE — Discharge Summary (Signed)
ELECTROPHYSIOLOGY PROCEDURE DISCHARGE SUMMARY    Patient ID: Lucas Porter,  MRN: [id number], DOB/AGE: [birth year] 56 y.o.  Admit date: 04/24/2012 Discharge date: 04/25/2012  Primary Care Physician: Nadyne Coombes, MD Primary Cardiologist: Nehemiah Massed, MD Electrophysiologist: Hillis Range, MD  Primary Discharge Diagnosis:  Atrial fibrillation status post ablation this admission  Secondary Discharge Diagnosis:  1. Coronary artery disease s/p CABG 2010, s/p Successful PCI 10/17/11 of Distal Left Main and the Left Circumflex with a Promus Element DES stent -- 4.0 mm x 16 mm postdilated to 4.12 mm  2.  Hypertension 3.  Anxiety 4.  DDD 5.  Chronic anticoagulation with Warfarin 6.  Tachy-brady syndrome status post Medtronic pacemaker implantation 2012 7.  Coagulopathy- persistent elevation of ACT  Procedures This Admission:  1.  Electrophysiology study and radiofrequency catheter ablation of atrial fibrillation on 04-24-2012 by Dr Johney Frame.  This study demonstrated sinus rhythm upon presentation, rotational Angiography reveals a moderate sized left atrium with four separate pulmonary veins without evidence of pulmonary vein stenosis, successful electrical isolation and anatomical encircling of all four pulmonary veins with radiofrequency current, cavo-tricuspid isthmus ablation was performed with complete bidirectional isthmus block achieved. There were no inducible arrhythmias following ablation and no early apparent complications.  Brief HPI: Lucas Porter is a 56 y.o. male with a history of paroxysmal atrial fibrillation and typical appearing atrial flutter who now presents for EP study and radiofrequency ablation. The patient reports initially being diagnosed with atrial fibrillation after presenting with symptomatic palpitations and fatgiue. The patient reports increasing frequency and duration of atrial arrhythmias since that time. The patient has failed medical therapy with Joice Lofts.  The patient therefore presents today for catheter ablation of atrial fibrillation and atrial flutter.  Hospital Course:  The patient was admitted and underwent ablation of atrial fibrillation with details as outlined above.  He was monitored on telemetry overnight which demonstrated atrial pacing with intrinsic ventricular conduction.  His pacemaker was interrogated morning after procedure and found to be functioning normally.  He did have some oozing from his groin with ambulation evening of procedure, but this resolved with pressure.  Dr Johney Frame examined the patient on 04-25-2012 and considered him stable for discharge to home.  With his chronically elevated ACT, Lovenox bridging was not done for his subtherapeutic INR.  He has follow up with [facility] on Monday.   Discharge Vitals: Blood pressure 131/73, pulse 61, temperature 97.9 F (36.6 C), temperature source Oral, resp. rate 19, height 5\' 10"  (1.778 m), weight 204 lb 2.3 oz (92.6 kg), SpO2 97.00%.    Labs:   Lab Results  Component Value Date   WBC 7.1 04/17/2012   HGB 17.3* 04/17/2012   HCT 51.1 04/17/2012   MCV 91.4 04/17/2012   PLT 162.0 04/17/2012     Lab 04/25/12 0558  NA 139  K 4.1  CL 105  CO2 27  BUN 13  CREATININE 0.81  CALCIUM 8.8  PROT --  BILITOT --  ALKPHOS --  ALT --  AST --  GLUCOSE 109*   INR- 1.51  Discharge Medications:  Medication List  As of 04/25/2012  8:58 AM   TAKE these medications         aspirin 81 MG tablet   Take 81 mg by mouth daily.      atenolol 50 MG tablet   Commonly known as: TENORMIN   Take 100 mg by mouth daily.      clopidogrel 75 MG tablet  Commonly known as: PLAVIX   Take 75 mg by mouth daily with breakfast.      diphenhydramine-acetaminophen 25-500 MG Tabs   Commonly known as: TYLENOL PM   Take 2 tablets by mouth at bedtime as needed. For sleep      dofetilide 500 MCG capsule   Commonly known as: TIKOSYN   Take 500 mcg by mouth 2 (two) times daily.      fish  oil-omega-3 fatty acids 1000 MG capsule   Take 2 g by mouth 2 (two) times daily.      lisinopril 20 MG tablet   Commonly known as: PRINIVIL,ZESTRIL   Take 20 mg by mouth daily.      niacin 1000 MG CR tablet   Commonly known as: NIASPAN   Take 1,000 mg by mouth at bedtime.      pantoprazole 40 MG tablet   Commonly known as: PROTONIX   Take 1 tablet (40 mg total) by mouth daily.      potassium chloride 10 MEQ tablet   Commonly known as: K-DUR,KLOR-CON   Take 10 mEq by mouth daily.      pravastatin 40 MG tablet   Commonly known as: PRAVACHOL   Take 40 mg by mouth daily.      testosterone 50 MG/5GM Gel   Commonly known as: ANDROGEL   Place 10 g onto the skin daily.      venlafaxine XR 75 MG 24 hr capsule   Commonly known as: EFFEXOR-XR   Take 75 mg by mouth daily. Take with 150mg  tablet to equal a total of 225mg  daily      venlafaxine XR 150 MG 24 hr capsule   Commonly known as: EFFEXOR-XR   Take 150 mg by mouth daily. Take with 75mg  tablet to equal a total of 225mg  daily      warfarin 5 MG tablet   Commonly known as: COUMADIN   Take 0.5-1 tablets (2.5-5 mg total) by mouth daily. Take 7.5 mg tonight (04/25/2012), then take 5 mg on Sat & Sun. Then as directed by [facility].            Disposition:  Discharge Orders    Future Appointments: Provider: Department: Dept Phone: Center:   07/24/2012 11:30 AM Hillis Range, MD [facility] [phone] [facility]     Future Orders Please Complete By Expires   Diet - low sodium heart healthy      Increase activity slowly      Discharge instructions      Comments:   Keep procedure site clean & dry. If you notice increased pain, swelling, bleeding or pus, call/return!  You may shower, but no soaking baths/hot tubs/pools for 1 week. No driving for 3 days. No lifting over 5 lbs for 1 week.     Follow-up Information    Follow up with [facility] on 04/28/2012. (At 10:00 AM)    Contact  information:   [address]  [place] [address]   [phone]      Follow up with Hillis Range, MD on 07/24/2012. (At 11:30 AM)    Contact information:   [address] [place] [address] [phone]          Duration of Discharge Encounter: Greater than 30 minutes including physician time.  Signed, Gypsy Balsam, RN, BSN 04/25/2012, 8:58 AM   I have seen, examined the patient, and reviewed the above assessment and plan.  Changes  to above are made where necessary.    Co Sign: Hillis Range, MD

## 2012-04-25 NOTE — Progress Notes (Signed)
   ELECTROPHYSIOLOGY ROUNDING NOTE    Patient Name: Lucas Porter Date of Encounter: 04-25-2012    SUBJECTIVE:Patient without chest pain or shortness of breath.  Some groin oozing last night with ambulation.  S/p RFCA of atrial fibrillation 04-24-2012  TELEMETRY: Reviewed telemetry pt in atrial pacing with intrinsic conduction; occasional intra atrial latency or failure to capture (Medtronic checking pacemaker).  Device interrogation this am is normal.  Filed Vitals:   04/25/12 0344 04/25/12 0400 04/25/12 0500 04/25/12 0600  BP:  101/58 112/61 124/74  Pulse:      Temp: 98.2 F (36.8 C)     TempSrc: Oral     Resp:      Height:      Weight:    204 lb 2.3 oz (92.6 kg)  SpO2: 97%       Intake/Output Summary (Last 24 hours) at 04/25/12 0724 Last data filed at 04/24/12 2300  Gross per 24 hour  Intake   1120 ml  Output    400 ml  Net    720 ml   LABS: Basic Metabolic Panel:  Basename 04/25/12 0558  NA 139  K 4.1  CL 105  CO2 27  GLUCOSE 109*  BUN 13  CREATININE 0.81  CALCIUM 8.8  MG --  PHOS --   INR: 1.51  PHYSICAL EXAM Physical Exam: Filed Vitals:   04/25/12 0400 04/25/12 0500 04/25/12 0600 04/25/12 0745  BP: 101/58 112/61 124/74 131/73  Pulse:    61  Temp:    97.9 F (36.6 C)  TempSrc:    Oral  Resp:      Height:      Weight:   204 lb 2.3 oz (92.6 kg)   SpO2:    97%    GEN- The patient is well appearing, alert and oriented x 3 today.   Head- normocephalic, atraumatic Eyes-  Sclera clear, conjunctiva pink Ears- hearing intact Oropharynx- clear Neck- supple, no JVP Lymph- no cervical lymphadenopathy Lungs- Clear to ausculation bilaterally, normal work of breathing Heart- Regular rate and rhythm, no murmurs, rubs or gallops, PMI not laterally displaced GI- soft, NT, ND, + BS Extremities- no clubbing, cyanosis, or edema, groins are ok    DEVICE INTERROGATION: Device interrogated by industry.  Lead values including impedence, sensing, threshold  within normal values.     Assessment and Plan: 1. afib- s/p ablation Doing well today DC to home  Given groin oozing and elevated ACT chronically, I do not feel that he should be bridged with lovenox. Increase coumadin and follow-up with [facility] coumadin clinic on Monday  Add PPI for 6 weeks No other medicine changes Follow-up with me in 12 weeks.  DC to home.

## 2012-04-29 ENCOUNTER — Telehealth: Payer: Self-pay | Admitting: Internal Medicine

## 2012-04-29 ENCOUNTER — Encounter: Payer: Self-pay | Admitting: *Deleted

## 2012-04-29 NOTE — Telephone Encounter (Signed)
May return to work one week following the procedure which is 8//15/13.  If Dr C has other restrictions then he will need to get the note from him  Wife aware

## 2012-04-29 NOTE — Telephone Encounter (Signed)
Please return call patient wife [phone] regarding return to work letter for pt.

## 2012-05-02 ENCOUNTER — Encounter: Payer: Self-pay | Admitting: *Deleted

## 2012-05-03 ENCOUNTER — Other Ambulatory Visit: Payer: Self-pay | Admitting: *Deleted

## 2012-05-03 ENCOUNTER — Other Ambulatory Visit: Payer: Self-pay | Admitting: Physician Assistant

## 2012-05-13 ENCOUNTER — Ambulatory Visit (INDEPENDENT_AMBULATORY_CARE_PROVIDER_SITE_OTHER): Payer: BC Managed Care – PPO | Admitting: Internal Medicine

## 2012-05-13 ENCOUNTER — Ambulatory Visit: Payer: BC Managed Care – PPO

## 2012-05-13 VITALS — BP 126/92 | HR 80 | Temp 97.8°F | Resp 18 | Ht 69.5 in | Wt 277.0 lb

## 2012-05-13 DIAGNOSIS — Z6841 Body Mass Index (BMI) 40.0 and over, adult: Secondary | ICD-10-CM

## 2012-05-13 DIAGNOSIS — S39012A Strain of muscle, fascia and tendon of lower back, initial encounter: Secondary | ICD-10-CM

## 2012-05-13 DIAGNOSIS — S339XXA Sprain of unspecified parts of lumbar spine and pelvis, initial encounter: Secondary | ICD-10-CM

## 2012-05-13 DIAGNOSIS — M545 Low back pain, unspecified: Secondary | ICD-10-CM

## 2012-05-13 MED ORDER — CYCLOBENZAPRINE HCL 10 MG PO TABS: 10.0000 mg | ORAL_TABLET | Freq: Every day | ORAL | Status: AC

## 2012-05-13 MED ORDER — TRAMADOL HCL 50 MG PO TABS: 50.0000 mg | ORAL_TABLET | Freq: Four times a day (QID) | ORAL | Status: AC | PRN

## 2012-05-13 NOTE — Progress Notes (Signed)
Subjective:    Patient ID: Lucas Porter, male    DOB: 1955-12-14, 56 y.o.   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 CAD (coronary artery disease) with CABG 2011, LIMA to LAD, Free radial to Lt. PDA, VG to Diag and ramus intermedius  . Anxiety  . Paroxysmal atrial fibrillation  . Hypertension  . Dyslipidemia  . Hypogonadism male  . S/P coronary artery stent placement to distal Lt. main and Lt. Cx, 10/17/11  . Angina pectoris  . Coagulopathy  . History of pacemaker, with a-v pacing  . Atrial flutter  . BMI 40.0-44.9, adult  Current outpatient prescriptions:ANDROGEL 50 MG/5GM GEL, APPLY TWO PACKETS TOPICALLY TO CLEAN/DRY SKIN EVERY DAY AS DIRECTED, Disp: 300 g, Rfl: 0;  aspirin 81 MG tablet, Take 81 mg by mouth daily., Disp: , Rfl: ;  atenolol (TENORMIN) 50 MG tablet, Take 100 mg by mouth daily. , Disp: , Rfl: ;  clopidogrel (PLAVIX) 75 MG tablet, Take 75 mg by mouth daily with breakfast., Disp: , Rfl:  diphenhydramine-acetaminophen (TYLENOL PM) 25-500 MG TABS, Take 2 tablets by mouth at bedtime as needed. For sleep, Disp: , Rfl: ;  dofetilide (TIKOSYN) 500 MCG capsule, Take 500 mcg by mouth 2 (two) times daily., Disp: , Rfl: ;  fish oil-omega-3 fatty acids 1000 MG capsule, Take 2 g by mouth 2 (two) times daily., Disp: , Rfl: ;  lisinopril (PRINIVIL,ZESTRIL) 20 MG tablet, Take 20 mg by mouth daily., Disp: , Rfl:  niacin (NIASPAN) 1000 MG CR tablet, Take 1,000 mg by mouth at bedtime., Disp: , Rfl: ;  pantoprazole (PROTONIX) 40 MG tablet, Take 1 tablet (40 mg total) by mouth daily., Disp: 30 tablet, Rfl: 2;  potassium chloride (K-DUR,KLOR-CON) 10 MEQ tablet,  Take 10 mEq by mouth daily., Disp: , Rfl: ;  pravastatin (PRAVACHOL) 40 MG tablet, Take 40 mg by mouth daily., Disp: , Rfl:  venlafaxine XR (EFFEXOR-XR) 150 MG 24 hr capsule, Take 150 mg by mouth daily. Take with 75mg  tablet to equal a total of 225mg  daily, Disp: , Rfl: ;  venlafaxine XR (EFFEXOR-XR) 75 MG 24 hr capsule, Take 75 mg by mouth daily. Take with 150mg  tablet to equal a total of 225mg  daily, Disp: , Rfl:  warfarin (COUMADIN) 5 MG tablet, Take 0.5-1 tablets (2.5-5 mg total) by mouth daily. Take 7.5 mg tonight (04/25/2012), then take 5 mg on Sat & Sun. Then as directed by [facility] Disp: 30 tablet, Rfl: 3;  cyclobenzaprine (FLEXERIL) 10 MG tablet, Take 1 tablet (10 mg total) by mouth at bedtime., Disp: 30 tablet, Rfl: 0;  traMADol (ULTRAM) 50 MG tablet, Take 1 tablet (50 mg total) by mouth every 6 (six) hours as needed for pain., Disp: 30 tablet, Rfl: 0    Review of Systems Genitourinary negative Gastrointestinal negative    Objective:   Physical Exam No acute distress Vital signs stable He's tender over the lumbar area just to the right of L4-5 Straight leg raise is normal to 90 bilaterally DTRs intact No sensory loss Range of motion creates discomfort in the low back  [facility] reading (PRIMARY) by  Dr. Doolittle=Disc spaces well preserved  Assessment & Plan:  Problem #1 acute lumbosacral pain secondary to muscle strain  Book given for exercises 30 minutes of heat daily Flexeril 10 mg at bedtime Tramadol one tablet every 6 hours as needed for pain No lifting at work for 2-3 weeks If not responding to 3 weeks followup for consideration of physical therapy

## 2012-05-17 ENCOUNTER — Other Ambulatory Visit: Payer: Self-pay | Admitting: Internal Medicine

## 2012-05-17 ENCOUNTER — Other Ambulatory Visit: Payer: Self-pay | Admitting: Physician Assistant

## 2012-06-23 ENCOUNTER — Other Ambulatory Visit: Payer: Self-pay | Admitting: Radiology

## 2012-06-23 ENCOUNTER — Telehealth: Payer: Self-pay

## 2012-06-23 NOTE — Telephone Encounter (Signed)
The patient's wife called to check status of generic Effexor rx for husband.  The patient is completely out of medication and stated that the pharmacy has sent request x 2.  The patient was given enough medication to get through weekend, but has no medication at this point.  Please call patient's wife at [phone]-patient's wife will be able to answer phone after 4pm.  The patient's wife works at [employer] and would like to be able to take rx with her when she leaves at 4pm.

## 2012-06-24 ENCOUNTER — Other Ambulatory Visit: Payer: Self-pay | Admitting: Physician Assistant

## 2012-06-24 MED ORDER — VENLAFAXINE HCL ER 75 MG PO CP24: 75.0000 mg | ORAL_CAPSULE | Freq: Every day | ORAL | Status: DC

## 2012-06-24 MED ORDER — VENLAFAXINE HCL ER 150 MG PO CP24: 150.0000 mg | ORAL_CAPSULE | Freq: Every day | ORAL | Status: DC

## 2012-06-24 NOTE — Telephone Encounter (Signed)
Further review of medication history shows that we send this medicine on August 31 via [facility] I would assume we should refill this medication immediately if at this dose for the next month so he will not run out of it but then sat up an appointment for him at 104 with Dr. Clelia Croft over the next 30 days to have his other medical problems reviewed He has a number of problems and he has not been seen here except for back pain since before January of 2013

## 2012-06-24 NOTE — Telephone Encounter (Signed)
There is not a visit in at the discusses who provides Effexor for this patient There is an abstract in January 2013 by Dr. Hal Hope that indicates he is on this medicine for anxiety

## 2012-06-24 NOTE — Telephone Encounter (Signed)
Left message for wife, I was confused of dosing for this, but now I have gotten from the pharmacy, and he is on 75 mg and 150mg  doses, this is sent in for him, if wife calls back, I just need to let her know these were sent in, and he needs to follow up before these run out.

## 2012-06-25 NOTE — Telephone Encounter (Signed)
At tl desk 

## 2012-06-26 ENCOUNTER — Other Ambulatory Visit: Payer: Self-pay | Admitting: *Deleted

## 2012-07-10 ENCOUNTER — Other Ambulatory Visit: Payer: Self-pay | Admitting: Physician Assistant

## 2012-07-18 ENCOUNTER — Ambulatory Visit (INDEPENDENT_AMBULATORY_CARE_PROVIDER_SITE_OTHER): Payer: BC Managed Care – PPO | Admitting: Emergency Medicine

## 2012-07-18 VITALS — BP 144/98 | HR 82 | Temp 98.2°F | Resp 18 | Ht 69.5 in | Wt 216.2 lb

## 2012-07-18 DIAGNOSIS — I10 Essential (primary) hypertension: Secondary | ICD-10-CM

## 2012-07-18 DIAGNOSIS — E782 Mixed hyperlipidemia: Secondary | ICD-10-CM

## 2012-07-18 DIAGNOSIS — E291 Testicular hypofunction: Secondary | ICD-10-CM

## 2012-07-18 LAB — POCT CBC
Granulocyte percent: 64.6 %G (ref 37–80)
MCV: 94.5 fL (ref 80–97)
MID (cbc): 0.5 (ref 0–0.9)
MPV: 8.3 fL (ref 0–99.8)
POC LYMPH PERCENT: 29 %L (ref 10–50)
POC MID %: 6.4 %M (ref 0–12)
Platelet Count, POC: 192 10*3/uL (ref 142–424)
RBC: 5.85 M/uL (ref 4.69–6.13)
RDW, POC: 13.7 %

## 2012-07-18 MED ORDER — DOFETILIDE 500 MCG PO CAPS: 500.0000 ug | ORAL_CAPSULE | Freq: Two times a day (BID) | ORAL | Status: DC

## 2012-07-18 MED ORDER — PANTOPRAZOLE SODIUM 40 MG PO TBEC: 40.0000 mg | DELAYED_RELEASE_TABLET | Freq: Every day | ORAL | Status: DC

## 2012-07-18 MED ORDER — VENLAFAXINE HCL ER 75 MG PO CP24: 75.0000 mg | ORAL_CAPSULE | Freq: Every day | ORAL | Status: DC

## 2012-07-18 MED ORDER — LISINOPRIL 20 MG PO TABS: 20.0000 mg | ORAL_TABLET | Freq: Every day | ORAL | Status: DC

## 2012-07-18 MED ORDER — VENLAFAXINE HCL ER 150 MG PO CP24: 150.0000 mg | ORAL_CAPSULE | Freq: Every day | ORAL | Status: DC

## 2012-07-18 NOTE — Progress Notes (Signed)
[facility] [address] [place] [address] [phone]  Date:  07/18/2012   Name:  Lucas Porter   DOB:  02-Oct-1955   MRN:  [id number]  PCP:  Tally Due, MD    Chief Complaint: Medication Refill   History of Present Illness:  Lucas Porter is a 57 y.o. very pleasant male patient who presents with the following:  Interval history since last visit significant for ablation.  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  Intermittent chest pain into left shoulder associated with no specific provocation.  Can last all day.  Has changed location and character since ablation.  No shortness of breath or edema, nocturia, orthopnea or pnd.  Pain today as he gets nervous when he seeks medical care.  Pain not radiating, no nausea diaphoresis or palpitations.  Patient Active Problem List  Diagnosis  . CAD (coronary artery disease) with CABG 2011, LIMA to LAD, Free radial to Lt. PDA, VG to Diag and ramus intermedius  . Anxiety  . Paroxysmal atrial fibrillation  . Hypertension  . Dyslipidemia  . Hypogonadism male  . S/P coronary artery stent placement to distal Lt. main and Lt. Cx, 10/17/11  . Angina pectoris  . Coagulopathy  . History of pacemaker, with a-v pacing  . Atrial flutter  . BMI 40.0-44.9, adult    Past Medical History  Diagnosis Date  . Coronary artery disease     s/p CABG 2010, s/p Successful PCI  10/17/11 of Distal Left Main and the Left Circumflex with a Promus Element DES stent -- 4.0 mm x 16 mm postdilated to 4.12 mm   . Hypertension   . Anxiety   . DDD (degenerative disc disease)   . Atrial fibrillation     paroxysmal  . S/P coronary artery stent placement to distal Lt. main and Lt. Cx 10/17/11  . 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 Coagulopathy     persistent elevation of ACT during 10/17/11 hospitalization  . Atrial flutter   . Hyperlipidemia   .  Heart murmur     Past Surgical History  Procedure Date  . Coronary angioplasty 10/19/11  . Coronary artery bypass graft 2010  . Insert / replace / remove pacemaker 2/12    Medtronic  . Tee without cardioversion 04/23/2012    Procedure: TRANSESOPHAGEAL ECHOCARDIOGRAM (TEE);  Surgeon: Wendall Stade, MD;  Location: [facility] ENDOSCOPY;  Service: Cardiovascular;  Laterality: N/A;  spoke to pt about time change from 11a to 12p/DL    History  Substance Use Topics  . Smoking status: Former Smoker -- 1 years    Types: Cigarettes    Quit date: 10/18/1978  . Smokeless tobacco: Not on file  . Alcohol Use: 0.6 oz/week    1 Cans of beer per week     rare    Family History  Problem Relation Age of Onset  . Coronary artery disease    . Stroke      No Known Allergies  Medication list has been reviewed and updated.  Current Outpatient Prescriptions on File Prior to Visit  Medication Sig Dispense Refill  . ANDROGEL 50 MG/5GM GEL APPLY 2 PACKETS TOPICALLY TO CLEAN/DRY SKIN EVERY DAY AS DIRECTED  300 g  0  . aspirin 81 MG tablet Take 81 mg by mouth daily.      Marland Kitchen atenolol (TENORMIN) 50 MG tablet  Take 100 mg by mouth daily.       . clopidogrel (PLAVIX) 75 MG tablet Take 75 mg by mouth daily with breakfast.      . diphenhydramine-acetaminophen (TYLENOL PM) 25-500 MG TABS Take 2 tablets by mouth at bedtime as needed. For sleep      . fish oil-omega-3 fatty acids 1000 MG capsule Take 2 g by mouth 2 (two) times daily.      Marland Kitchen lisinopril (PRINIVIL,ZESTRIL) 20 MG tablet TAKE ONE TABLET BY MOUTH EVERY DAY  90 tablet  0  . niacin (NIASPAN) 1000 MG CR tablet Take 1,000 mg by mouth at bedtime.      . potassium chloride (K-DUR,KLOR-CON) 10 MEQ tablet Take 10 mEq by mouth daily.      . pravastatin (PRAVACHOL) 40 MG tablet Take 1 tablet (40 mg total) by mouth daily. Needs office visit  15 tablet  0  . warfarin (COUMADIN) 5 MG tablet Take 0.5-1 tablets (2.5-5 mg total) by mouth daily. Take 7.5 mg tonight  (04/25/2012), then take 5 mg on Sat & Sun. Then as directed by [facility].  30 tablet  3  . DISCONTD: dofetilide (TIKOSYN) 500 MCG capsule Take 500 mcg by mouth 2 (two) times daily.      Marland Kitchen DISCONTD: pantoprazole (PROTONIX) 40 MG tablet Take 1 tablet (40 mg total) by mouth daily.  30 tablet  2  . DISCONTD: venlafaxine XR (EFFEXOR-XR) 150 MG 24 hr capsule Take 1 capsule (150 mg total) by mouth daily. Take with 75mg  tablet to equal a total of 225mg  daily  30 capsule  0  . DISCONTD: venlafaxine XR (EFFEXOR-XR) 75 MG 24 hr capsule Take 1 capsule (75 mg total) by mouth daily. Take with 150mg  tablet to equal a total of 225mg  daily  30 capsule  0  . pravastatin (PRAVACHOL) 40 MG tablet Take 40 mg by mouth daily.      Marland Kitchen venlafaxine XR (EFFEXOR-XR) 150 MG 24 hr capsule TAKE ONE CAPSULE BY MOUTH EVERY DAY  30 capsule  0  . venlafaxine XR (EFFEXOR-XR) 75 MG 24 hr capsule TAKE ONE CAPSULE BY MOUTH EVERY DAY  30 capsule  0    Review of Systems:  As per HPI, otherwise negative.    Physical Examination: Filed Vitals:   07/18/12 0939  BP: 144/98  Pulse: 82  Temp: 98.2 F (36.8 C)  Resp: 18   Filed Vitals:   07/18/12 0939  Height: 5' 9.5" (1.765 m)  Weight: 216 lb 3.2 oz (98.068 kg)   Body mass index is 31.47 kg/(m^2). Ideal Body Weight: Weight in (lb) to have BMI = 25: 171.4   GEN: WDWN, NAD, Non-toxic, A & O x 3 HEENT: Atraumatic, Normocephalic. Neck supple. No masses, No LAD. Ears and Nose: No external deformity. CV: RRR, No M/G/R. No JVD. No thrill. No extra heart sounds. PULM: CTA B, no wheezes, crackles, rhonchi. No retractions. No resp. distress. No accessory muscle use. ABD: S, NT, ND, +BS. No rebound. No HSM. EXTR: No c/c/e NEURO Normal gait.  PSYCH: Normally interactive. Conversant. Not depressed or anxious appearing.  Calm demeanor.    Assessment and Plan: CAD Atrial fib Hypertension Meds refilled  Carmelina Dane, MD

## 2012-07-19 LAB — COMPREHENSIVE METABOLIC PANEL
ALT: 21 U/L (ref 0–53)
AST: 19 U/L (ref 0–37)
Albumin: 4.4 g/dL (ref 3.5–5.2)
Alkaline Phosphatase: 61 U/L (ref 39–117)
BUN: 14 mg/dL (ref 6–23)
Creat: 0.88 mg/dL (ref 0.50–1.35)
Potassium: 4.2 mEq/L (ref 3.5–5.3)

## 2012-07-19 LAB — LIPID PANEL
HDL: 39 mg/dL — ABNORMAL LOW (ref >39)
LDL Cholesterol: 103 mg/dL — ABNORMAL HIGH (ref 0–99)
Total CHOL/HDL Ratio: 5.1 Ratio

## 2012-07-19 LAB — PSA: PSA: 0.95 ng/mL (ref <=4.00)

## 2012-07-19 LAB — VITAMIN D 25 HYDROXY (VIT D DEFICIENCY, FRACTURES): Vit D, 25-Hydroxy: 34 ng/mL (ref 30–89)

## 2012-07-24 ENCOUNTER — Encounter: Payer: Self-pay | Admitting: Internal Medicine

## 2012-07-24 ENCOUNTER — Ambulatory Visit (INDEPENDENT_AMBULATORY_CARE_PROVIDER_SITE_OTHER): Payer: BC Managed Care – PPO | Admitting: Internal Medicine

## 2012-07-24 ENCOUNTER — Encounter: Payer: BC Managed Care – PPO | Admitting: Internal Medicine

## 2012-07-24 VITALS — BP 138/78 | HR 106 | Ht 69.0 in | Wt 217.0 lb

## 2012-07-24 DIAGNOSIS — I10 Essential (primary) hypertension: Secondary | ICD-10-CM

## 2012-07-24 DIAGNOSIS — I480 Paroxysmal atrial fibrillation: Secondary | ICD-10-CM

## 2012-07-24 DIAGNOSIS — I4891 Unspecified atrial fibrillation: Secondary | ICD-10-CM

## 2012-07-24 DIAGNOSIS — Z955 Presence of coronary angioplasty implant and graft: Secondary | ICD-10-CM

## 2012-07-24 DIAGNOSIS — Z9861 Coronary angioplasty status: Secondary | ICD-10-CM

## 2012-07-24 MED ORDER — DOFETILIDE 500 MCG PO CAPS: 500.0000 ug | ORAL_CAPSULE | Freq: Two times a day (BID) | ORAL | Status: DC

## 2012-07-24 MED ORDER — DILTIAZEM HCL ER 180 MG PO CP24: 180.0000 mg | ORAL_CAPSULE | Freq: Every day | ORAL | Status: DC

## 2012-07-24 NOTE — Assessment & Plan Note (Signed)
Stable No change required today  

## 2012-07-24 NOTE — Progress Notes (Signed)
PCP: Tally Due, MD Primary Cardiologist:  [facility]  Lucas Porter is a 56 y.o. male who presents today for routine electrophysiology followup.  Since his afib ablation, the patient reports doing well.  He has occasional palpitations.  He denies procedure related complications.  He continues to have atypical chest pain. Today, he denies symptoms of shortness of breath,  lower extremity edema, dizziness, presyncope, or syncope.  The patient is otherwise without complaint today.   Past Medical History  Diagnosis Date  . Coronary artery disease     s/p CABG 2010, s/p Successful PCI  10/17/11 of Distal Left Main and the Left Circumflex with a Promus Element DES stent -- 4.0 mm x 16 mm postdilated to 4.12 mm   . Hypertension   . Anxiety   . DDD (degenerative disc disease)   . Atrial fibrillation     paroxysmal  . S/P coronary artery stent placement to distal Lt. main and Lt. Cx 10/17/11  . 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 Coagulopathy     persistent elevation of ACT during 10/17/11 hospitalization  . Atrial flutter   . Hyperlipidemia   . Heart murmur    Past Surgical History  Procedure Date  . Coronary angioplasty 10/19/11  . Coronary artery bypass graft 2010  . Insert / replace / remove pacemaker 2/12    Medtronic  . Tee without cardioversion 04/23/2012    Procedure: TRANSESOPHAGEAL ECHOCARDIOGRAM (TEE);  Surgeon: Wendall Stade, MD;  Location: [facility] ENDOSCOPY;  Service: Cardiovascular;  Laterality: N/A;  spoke to pt about time change from 11a to 12p/DL  . Atrial fibrillation ablation 04/24/12    PVI and CTI ablation by Dr Johney Frame    Current Outpatient Prescriptions  Medication Sig Dispense Refill  . ANDROGEL 50 MG/5GM GEL APPLY 2 PACKETS TOPICALLY TO CLEAN/DRY SKIN EVERY DAY AS DIRECTED  300 g  0  . aspirin 81 MG tablet Take 81 mg by mouth daily.      Marland Kitchen atenolol (TENORMIN) 50 MG tablet Take 100 mg by mouth daily.       . clopidogrel (PLAVIX) 75  MG tablet Take 75 mg by mouth daily with breakfast.      . diphenhydramine-acetaminophen (TYLENOL PM) 25-500 MG TABS Take 2 tablets by mouth at bedtime as needed. For sleep      . dofetilide (TIKOSYN) 500 MCG capsule Take 1 capsule (500 mcg total) by mouth 2 (two) times daily.  60 capsule  5  . fish oil-omega-3 fatty acids 1000 MG capsule Take 2 g by mouth 2 (two) times daily.      Marland Kitchen lisinopril (PRINIVIL,ZESTRIL) 20 MG tablet TAKE ONE TABLET BY MOUTH EVERY DAY  90 tablet  0  . lisinopril (PRINIVIL,ZESTRIL) 20 MG tablet Take 1 tablet (20 mg total) by mouth daily.  30 tablet  5  . niacin (NIASPAN) 1000 MG CR tablet Take 1,000 mg by mouth at bedtime.      Marland Kitchen NITROSTAT 0.4 MG SL tablet as needed.      . pantoprazole (PROTONIX) 40 MG tablet Take 1 tablet (40 mg total) by mouth daily.  30 tablet  2  . potassium chloride (K-DUR,KLOR-CON) 10 MEQ tablet Take 10 mEq by mouth daily.      . pravastatin (PRAVACHOL) 40 MG tablet Take 40 mg by mouth daily.      Marland Kitchen venlafaxine XR (EFFEXOR-XR) 150 MG 24 hr capsule Take 1 capsule (150 mg total) by mouth  daily. Take with 75mg  tablet to equal a total of 225mg  daily  30 capsule  0  . venlafaxine XR (EFFEXOR-XR) 75 MG 24 hr capsule Take 1 capsule (75 mg total) by mouth daily. Take with 150mg  tablet to equal a total of 225mg  daily  30 capsule  0  . warfarin (COUMADIN) 5 MG tablet Take 0.5-1 tablets (2.5-5 mg total) by mouth daily. Take 7.5 mg tonight (04/25/2012), then take 5 mg on Sat & Sun. Then as directed by [facility].  30 tablet  3  . [DISCONTINUED] venlafaxine XR (EFFEXOR-XR) 150 MG 24 hr capsule TAKE ONE CAPSULE BY MOUTH EVERY DAY  30 capsule  0  . [DISCONTINUED] venlafaxine XR (EFFEXOR-XR) 75 MG 24 hr capsule TAKE ONE CAPSULE BY MOUTH EVERY DAY  30 capsule  0    Physical Exam: Filed Vitals:   07/24/12 0904  BP: 138/78  Pulse: 106  Height: 5\' 9"  (1.753 m)  Weight: 217 lb (98.431 kg)  SpO2: 98%    GEN- The patient is well appearing, alert and oriented x 3 today.    Head- normocephalic, atraumatic Eyes-  Sclera clear, conjunctiva pink Ears- hearing intact Oropharynx- clear Lungs- Clear to ausculation bilaterally, normal work of breathing Heart- irregular rate and rhythm, no murmurs, rubs or gallops, PMI not laterally displaced GI- soft, NT, ND, + BS Extremities- no clubbing, cyanosis, or edema  ekg today reveals afib with elevated V rates Pacemaker interrogation today is reviewed (see paper chart)  Assessment and Plan:

## 2012-07-24 NOTE — Patient Instructions (Addendum)
Your physician recommends that you schedule a follow-up appointment in: 3 months with Dr Johney Frame   Your physician has recommended you make the following change in your medication:  1) Start Diltiazem CD 180mg  daily

## 2012-07-24 NOTE — Assessment & Plan Note (Signed)
afib burden is improved (16%) but not resolved post ablation.  He is just now at the end of his 12 week healing phase. I will add cardizem for rate control and continue tikosyn/ coumadin. He will return in 3 months.  I hope that with healing his afib burden will improve further.  IF not then we will likely consider repeat ablation at that time.

## 2012-07-28 MED ORDER — TESTOSTERONE 50 MG/5GM (1%) TD GEL: TRANSDERMAL | Status: DC

## 2012-07-29 ENCOUNTER — Other Ambulatory Visit: Payer: Self-pay | Admitting: Radiology

## 2012-07-29 ENCOUNTER — Other Ambulatory Visit: Payer: Self-pay | Admitting: *Deleted

## 2012-08-05 ENCOUNTER — Other Ambulatory Visit: Payer: Self-pay | Admitting: *Deleted

## 2012-08-05 MED ORDER — PRAVASTATIN SODIUM 40 MG PO TABS: 40.0000 mg | ORAL_TABLET | Freq: Every day | ORAL | Status: DC

## 2012-08-20 ENCOUNTER — Other Ambulatory Visit: Payer: Self-pay | Admitting: Emergency Medicine

## 2012-09-19 ENCOUNTER — Other Ambulatory Visit: Payer: Self-pay | Admitting: Physician Assistant

## 2012-10-19 ENCOUNTER — Other Ambulatory Visit: Payer: Self-pay | Admitting: Physician Assistant

## 2012-10-24 ENCOUNTER — Ambulatory Visit (INDEPENDENT_AMBULATORY_CARE_PROVIDER_SITE_OTHER): Payer: BC Managed Care – PPO | Admitting: Family Medicine

## 2012-10-24 VITALS — BP 140/95 | HR 88 | Temp 97.8°F | Resp 18 | Wt 217.0 lb

## 2012-10-24 DIAGNOSIS — R3911 Hesitancy of micturition: Secondary | ICD-10-CM

## 2012-10-24 DIAGNOSIS — R350 Frequency of micturition: Secondary | ICD-10-CM

## 2012-10-24 DIAGNOSIS — R351 Nocturia: Secondary | ICD-10-CM

## 2012-10-24 LAB — POCT UA - MICROSCOPIC ONLY
Bacteria, U Microscopic: NEGATIVE
Casts, Ur, LPF, POC: NEGATIVE
Yeast, UA: NEGATIVE

## 2012-10-24 LAB — BASIC METABOLIC PANEL
Calcium: 9.5 mg/dL (ref 8.4–10.5)
Potassium: 4.3 mEq/L (ref 3.5–5.3)
Sodium: 135 mEq/L (ref 135–145)

## 2012-10-24 LAB — POCT URINALYSIS DIPSTICK
Bilirubin, UA: NEGATIVE
Blood, UA: NEGATIVE
Glucose, UA: NEGATIVE
Nitrite, UA: NEGATIVE
Spec Grav, UA: 1.02
Urobilinogen, UA: 0.2

## 2012-10-24 LAB — POCT CBC
Hemoglobin: 18.7 g/dL — AB (ref 14.1–18.1)
MCH, POC: 31.8 pg — AB (ref 27–31.2)
MID (cbc): 0.6 (ref 0–0.9)
MPV: 9.8 fL (ref 0–99.8)
POC Granulocyte: 5.5 (ref 2–6.9)
POC MID %: 7.9 %M (ref 0–12)
Platelet Count, POC: 197 10*3/uL (ref 142–424)
RBC: 5.88 M/uL (ref 4.69–6.13)

## 2012-10-24 LAB — PSA: PSA: 1.75 ng/mL (ref <=4.00)

## 2012-10-24 NOTE — Patient Instructions (Signed)
Some of your symptoms may improve if you drink more fluids during the day and less before bedtime. Your should receive a call or letter about your lab results within the next week to 10 days.  Recheck in the next 1 week and if still having symptoms - will refer you to a urologist. Return to the clinic or go to the nearest emergency room if any of your symptoms worsen or new symptoms occur.

## 2012-10-24 NOTE — Progress Notes (Signed)
Subjective:    Patient ID: Lucas Porter, male    DOB: 1955-11-30, 57 y.o.   MRN: [id number]  HPI Lucas Porter is a 57 y.o. male  Thinks may have some prostate problems. Notes for about 3 months - didn't have sensation that was urinating at the time. Intermittent dribbling, no saddle anesthesia, or numbness of skin. Hesitancy.   Odor and darker urine for almost a month. No fever. Increased nocturia - up to 5 times or more recently.  Also notes frequency in the morning.    Notes later in office visit - does not drink much during the day, but drinks most of fluids at night.   PSA 0.95 07/18/12   Review of Systems  Constitutional: Negative for fever and chills.  Gastrointestinal: Negative for abdominal pain.  Genitourinary: Positive for urgency, frequency, decreased urine volume (past few months?) and difficulty urinating. Negative for dysuria, hematuria, flank pain, penile swelling and testicular pain.  Musculoskeletal: Negative for back pain (no new back pain. ).  Skin: Negative for rash.       Objective:   Physical Exam  Vitals reviewed. Constitutional: He is oriented to person, place, and time. He appears well-developed and well-nourished. No distress.  Cardiovascular: Normal rate.  An irregularly irregular rhythm present.  Pulmonary/Chest: Effort normal.  Abdominal: Soft. Bowel sounds are normal. Hernia confirmed negative in the right inguinal area and confirmed negative in the left inguinal area.  Genitourinary: Penis normal. Prostate is not tender. Circumcised. No penile erythema or penile tenderness. No discharge found.       Subjectively decreased testicular volume bilaterally, but nontender.    Prostate without apparent focal ttp.   Neurological: He is alert and oriented to person, place, and time.  Skin: Skin is warm and dry.  Psychiatric: He has a normal mood and affect. His behavior is normal.     Results for orders placed in visit on 10/24/12  POCT CBC    Component Value Range   WBC 8.0  4.6 - 10.2 K/uL   Lymph, poc 1.9  0.6 - 3.4   POC LYMPH PERCENT 23.3  10 - 50 %L   MID (cbc) 0.6  0 - 0.9   POC MID % 7.9  0 - 12 %M   POC Granulocyte 5.5  2 - 6.9   Granulocyte percent 68.8  37 - 80 %G   RBC 5.88  4.69 - 6.13 M/uL   Hemoglobin 18.7 (*) 14.1 - 18.1 g/dL   HCT, POC 40.9 (*) 81.1 - 53.7 %   MCV 92.8  80 - 97 fL   MCH, POC 31.8 (*) 27 - 31.2 pg   MCHC 34.2  31.8 - 35.4 g/dL   RDW, POC 91.4     Platelet Count, POC 197  142 - 424 K/uL   MPV 9.8  0 - 99.8 fL  POCT UA - MICROSCOPIC ONLY      Component Value Range   WBC, Ur, HPF, POC 0-1     RBC, urine, microscopic 1-4     Bacteria, U Microscopic neg     Mucus, UA trace     Epithelial cells, urine per micros neg     Crystals, Ur, HPF, POC neg     Casts, Ur, LPF, POC neg     Yeast, UA neg    POCT URINALYSIS DIPSTICK      Component Value Range   Color, UA amber     Clarity, UA clear  Glucose, UA neg     Bilirubin, UA neg     Ketones, UA trace     Spec Grav, UA 1.020     Blood, UA neg     pH, UA 6.5     Protein, UA 30     Urobilinogen, UA 0.2     Nitrite, UA neg     Leukocytes, UA Negative          Assessment & Plan:  Lucas Porter is a 57 y.o. male 1. Urinary frequency  POCT CBC, POCT UA - Microscopic Only, POCT urinalysis dipstick, PSA, Basic metabolic panel, Urine culture  2. Nocturia  POCT CBC, POCT UA - Microscopic Only, POCT urinalysis dipstick, PSA, Basic metabolic panel, Urine culture  3. Hesitancy  POCT CBC, POCT UA - Microscopic Only, POCT urinalysis dipstick, PSA, Basic metabolic panel   Suspect some behavioral component - discussed increase in daytime fluids, less evening fluids. Recheck in next 1 week.  Check PSA and urine cx. Hold on abx for now, as nontender and high risk chronic meds. Consider urology eval if not improving in next 1 week.  Recheck in 1 week.   Patient Instructions  Some of your symptoms may improve if you drink more fluids during the  day and less before bedtime. Your should receive a call or letter about your lab results within the next week to 10 days.  Recheck in the next 1 week and if still having symptoms - will refer you to a urologist. Return to the clinic or go to the nearest emergency room if any of your symptoms worsen or new symptoms occur.

## 2012-10-25 LAB — URINE CULTURE: Organism ID, Bacteria: NO GROWTH

## 2012-11-01 ENCOUNTER — Other Ambulatory Visit: Payer: Self-pay | Admitting: Emergency Medicine

## 2012-11-07 ENCOUNTER — Encounter: Payer: Self-pay | Admitting: Internal Medicine

## 2012-11-07 ENCOUNTER — Ambulatory Visit (INDEPENDENT_AMBULATORY_CARE_PROVIDER_SITE_OTHER): Payer: BC Managed Care – PPO | Admitting: Internal Medicine

## 2012-11-07 ENCOUNTER — Other Ambulatory Visit: Payer: Self-pay | Admitting: Cardiovascular Disease

## 2012-11-07 ENCOUNTER — Ambulatory Visit (INDEPENDENT_AMBULATORY_CARE_PROVIDER_SITE_OTHER): Payer: BC Managed Care – PPO | Admitting: *Deleted

## 2012-11-07 ENCOUNTER — Encounter: Payer: Self-pay | Admitting: *Deleted

## 2012-11-07 ENCOUNTER — Other Ambulatory Visit: Payer: Self-pay

## 2012-11-07 VITALS — BP 148/92 | HR 66 | Ht 70.0 in | Wt 219.0 lb

## 2012-11-07 DIAGNOSIS — I4891 Unspecified atrial fibrillation: Secondary | ICD-10-CM

## 2012-11-07 DIAGNOSIS — I4892 Unspecified atrial flutter: Secondary | ICD-10-CM

## 2012-11-07 LAB — PACEMAKER DEVICE OBSERVATION
AL AMPLITUDE: 5.5 mv
AL IMPEDENCE PM: 600 Ohm
RV LEAD IMPEDENCE PM: 600 Ohm
RV LEAD THRESHOLD: 1 V
VENTRICULAR PACING PM: 8.05

## 2012-11-07 NOTE — Assessment & Plan Note (Signed)
Last PCI was 1/13.  I have spoken with Dr Royann Shivers today who recommends that we stop Plavix and continue ASA per guidelines as he is more than 1 year post PCI.

## 2012-11-07 NOTE — Assessment & Plan Note (Signed)
The patient has ongoing symptomatic afib s/p ablation.  Therapeutic strategies for afib including medicine and ablation were discussed in detail with the patient today. Risk, benefits, and alternatives to EP study and radiofrequency ablation for afib were also discussed in detail today. These risks include but are not limited to stroke, bleeding, vascular damage, tamponade, perforation, damage to the esophagus, lungs, and other structures, pulmonary vein stenosis, worsening renal function, pacemaker lead dislodgement and death. The patient understands these risk and wishes to proceed.  We will therefore proceed with catheter ablation at the next available time.

## 2012-11-07 NOTE — Progress Notes (Signed)
Pacer check with [employer]

## 2012-11-07 NOTE — Progress Notes (Signed)
PCP: Tally Due, MD Primary Cardiologist:  [facility]  Lucas Porter is a 57 y.o. male who presents today for routine electrophysiology followup.  Since his afib ablation, the patient reports doing well.   He continues to have atypical chest pain.  He also has symptomatic afib, frequently at night.  Today, he denies symptoms of shortness of breath,  lower extremity edema, dizziness, presyncope, or syncope.  The patient is otherwise without complaint today.   Past Medical History  Diagnosis Date  . Coronary artery disease     s/p CABG 2010, s/p Successful PCI  10/17/11 of Distal Left Main and the Left Circumflex with a Promus Element DES stent -- 4.0 mm x 16 mm postdilated to 4.12 mm   . Hypertension   . Anxiety   . DDD (degenerative disc disease)   . Atrial fibrillation     paroxysmal  . S/P coronary artery stent placement to distal Lt. main and Lt. Cx 10/17/11  . 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 Coagulopathy     persistent elevation of ACT during 10/17/11 hospitalization  . Atrial flutter   . Hyperlipidemia   . Heart murmur    Past Surgical History  Procedure Laterality Date  . Coronary angioplasty  10/19/11  . Coronary artery bypass graft  2010  . Insert / replace / remove pacemaker  2/12    Medtronic  . Tee without cardioversion  04/23/2012    Procedure: TRANSESOPHAGEAL ECHOCARDIOGRAM (TEE);  Surgeon: Wendall Stade, MD;  Location: [facility] ENDOSCOPY;  Service: Cardiovascular;  Laterality: N/A;  spoke to pt about time change from 11a to 12p/DL  . Atrial fibrillation ablation  04/24/12    PVI and CTI ablation by Dr Johney Frame    Current Outpatient Prescriptions  Medication Sig Dispense Refill  . aspirin 81 MG tablet Take 81 mg by mouth daily.      Marland Kitchen atenolol (TENORMIN) 50 MG tablet Take 100 mg by mouth daily.       Marland Kitchen diltiazem (DILACOR XR) 180 MG 24 hr capsule Take 1 capsule (180 mg total) by mouth daily.  90 capsule  3  . dofetilide (TIKOSYN) 500  MCG capsule Take 1 capsule (500 mcg total) by mouth 2 (two) times daily.  180 capsule  3  . fish oil-omega-3 fatty acids 1000 MG capsule Take 2 g by mouth 2 (two) times daily.      Marland Kitchen lisinopril (PRINIVIL,ZESTRIL) 20 MG tablet TAKE ONE TABLET BY MOUTH EVERY DAY  90 tablet  0  . niacin (NIASPAN) 1000 MG CR tablet Take 1,000 mg by mouth at bedtime.      Marland Kitchen NITROSTAT 0.4 MG SL tablet as needed.      . pantoprazole (PROTONIX) 40 MG tablet TAKE ONE TABLET BY MOUTH EVERY DAY  30 tablet  0  . potassium chloride (K-DUR,KLOR-CON) 10 MEQ tablet Take 10 mEq by mouth daily.      . pravastatin (PRAVACHOL) 40 MG tablet Take 1 tablet (40 mg total) by mouth daily.  30 tablet  5  . testosterone (ANDROGEL) 50 MG/5GM GEL Increase dose to 4 packets daily.  Disp q.s for one month  300 g  5  . venlafaxine XR (EFFEXOR-XR) 150 MG 24 hr capsule TAKE ONE CAPSULE BY MOUTH EVERY DAY. TAKE WITH 75MG  TO EQUAL A TOTAL OF 225MG  DAILY  30 capsule  0  . venlafaxine XR (EFFEXOR-XR) 75 MG 24 hr capsule TAKE ONE CAPSULE BY MOUTH  EVERY DAY. TAKE WITH 150MG  TO EQUAL A TOTAL OF 225MG  DAILY  30 capsule  0  . warfarin (COUMADIN) 5 MG tablet Take 0.5-1 tablets (2.5-5 mg total) by mouth daily. Take 7.5 mg tonight (04/25/2012), then take 5 mg on Sat & Sun. Then as directed by [facility].  30 tablet  3   No current facility-administered medications for this visit.    Physical Exam: Filed Vitals:   11/07/12 0902  BP: 148/92  Pulse: 66  Height: 5\' 10"  (1.778 m)  Weight: 219 lb (99.338 kg)    GEN- The patient is well appearing, alert and oriented x 3 today.   Head- normocephalic, atraumatic Eyes-  Sclera clear, conjunctiva pink Ears- hearing intact Oropharynx- clear Lungs- Clear to ausculation bilaterally, normal work of breathing Heart- irregular rate and rhythm, no murmurs, rubs or gallops, PMI not laterally displaced GI- soft, NT, ND, + BS Extremities- no clubbing, cyanosis, or edema  Pacemaker interrogation today is reviewed (see  paper chart)  Assessment and Plan:

## 2012-11-07 NOTE — Assessment & Plan Note (Signed)
Stable No change required today  

## 2012-11-07 NOTE — Patient Instructions (Addendum)
Your physician has recommended that you have an ablation. Catheter ablation is a medical procedure used to treat some cardiac arrhythmias (irregular heartbeats). During catheter ablation, a long, thin, flexible tube is put into a blood vessel in your groin (upper thigh), or neck. This tube is called an ablation catheter. It is then guided to your heart through the blood vessel. Radio frequency waves destroy small areas of heart tissue where abnormal heartbeats may cause an arrhythmia to start. Please see the instruction sheet given to you today.  Your physician has recommended you make the following change in your medication:  1) Stop Plavix   Dr C to call you to set up TEE for 12/01/12   Will need weekly INR's 3 weeks prior to ablation  and have them faxed to me at [phone] atn [facility]  See instruction sheet for ablation

## 2012-11-11 ENCOUNTER — Other Ambulatory Visit: Payer: Self-pay | Admitting: *Deleted

## 2012-11-12 NOTE — Addendum Note (Signed)
Addended by: Thurmon Fair on: 11/12/2012 06:42 PM   Modules accepted: Orders

## 2012-11-13 ENCOUNTER — Encounter: Payer: Self-pay | Admitting: Internal Medicine

## 2012-11-13 LAB — PROTIME-INR: INR: 1.9 — AB (ref 0.9–1.1)

## 2012-11-18 ENCOUNTER — Encounter (HOSPITAL_COMMUNITY): Payer: Self-pay | Admitting: Respiratory Therapy

## 2012-11-20 ENCOUNTER — Telehealth: Payer: Self-pay | Admitting: Internal Medicine

## 2012-11-20 NOTE — Telephone Encounter (Signed)
Pt was in and had an ablation scheduled, told to come in for labs 3-11, can get order placed?

## 2012-11-20 NOTE — Telephone Encounter (Signed)
Order in and patient's wife aware I have lmom for Belenda Cruise at [facility] coumadin clinic to fax his INR's to [phone]

## 2012-11-24 ENCOUNTER — Encounter: Payer: Self-pay | Admitting: Internal Medicine

## 2012-11-25 ENCOUNTER — Telehealth: Payer: Self-pay

## 2012-11-25 ENCOUNTER — Other Ambulatory Visit: Payer: Self-pay

## 2012-11-25 ENCOUNTER — Other Ambulatory Visit (INDEPENDENT_AMBULATORY_CARE_PROVIDER_SITE_OTHER): Payer: BC Managed Care – PPO

## 2012-11-25 LAB — BASIC METABOLIC PANEL
Chloride: 105 mEq/L (ref 96–112)
GFR: 80.1 mL/min (ref >60.00)
Glucose, Bld: 70 mg/dL (ref 70–99)
Potassium: 4.1 mEq/L (ref 3.5–5.1)
Sodium: 138 mEq/L (ref 135–145)

## 2012-11-25 LAB — CBC WITH DIFFERENTIAL/PLATELET
Basophils Relative: 0.4 % (ref 0.0–3.0)
Eosinophils Absolute: 0.2 10*3/uL (ref 0.0–0.7)
Eosinophils Relative: 2.2 % (ref 0.0–5.0)
Lymphocytes Relative: 24.2 % (ref 12.0–46.0)
MCV: 90.4 fl (ref 78.0–100.0)
Monocytes Absolute: 0.7 10*3/uL (ref 0.1–1.0)
Neutrophils Relative %: 64.7 % (ref 43.0–77.0)
Platelets: 183 10*3/uL (ref 150.0–400.0)
RBC: 5.86 Mil/uL — ABNORMAL HIGH (ref 4.22–5.81)
WBC: 7.8 10*3/uL (ref 4.5–10.5)

## 2012-11-25 LAB — PROTIME-INR: Prothrombin Time: 27.2 s — ABNORMAL HIGH (ref 10.2–12.4)

## 2012-11-25 NOTE — Telephone Encounter (Signed)
Hope from the [place] lab called with a critical hgb.  Hgb=18.0.  Dr Graciela Husbands (dod) was notified and reccommends that Mr Lucas Porter have a cbc rechecked in a couple days.  Pt was notified.

## 2012-11-27 ENCOUNTER — Other Ambulatory Visit: Payer: Self-pay | Admitting: Physician Assistant

## 2012-11-28 ENCOUNTER — Other Ambulatory Visit (INDEPENDENT_AMBULATORY_CARE_PROVIDER_SITE_OTHER): Payer: BC Managed Care – PPO

## 2012-11-28 DIAGNOSIS — I4891 Unspecified atrial fibrillation: Secondary | ICD-10-CM

## 2012-11-28 LAB — CBC WITH DIFFERENTIAL/PLATELET
Eosinophils Relative: 2 % (ref 0.0–5.0)
HCT: 54 % — ABNORMAL HIGH (ref 39.0–52.0)
Hemoglobin: 18.3 g/dL (ref 13.0–17.0)
Lymphs Abs: 1.9 10*3/uL (ref 0.7–4.0)
MCV: 91.5 fl (ref 78.0–100.0)
Monocytes Absolute: 0.6 10*3/uL (ref 0.1–1.0)
Monocytes Relative: 8.4 % (ref 3.0–12.0)
Neutro Abs: 4.8 10*3/uL (ref 1.4–7.7)
WBC: 7.6 10*3/uL (ref 4.5–10.5)

## 2012-12-01 ENCOUNTER — Encounter (HOSPITAL_COMMUNITY)
Admission: RE | Disposition: A | Discharge status: Home / Self Care | Payer: Self-pay | Source: Ambulatory Visit | Attending: Cardiovascular Disease

## 2012-12-01 ENCOUNTER — Ambulatory Visit (HOSPITAL_COMMUNITY)
Admission: RE | Admit: 2012-12-01 | Discharge: 2012-12-01 | Disposition: A | Discharge status: Home / Self Care | Payer: BC Managed Care – PPO | Source: Ambulatory Visit | Attending: Cardiovascular Disease | Admitting: Cardiovascular Disease

## 2012-12-01 ENCOUNTER — Encounter (HOSPITAL_COMMUNITY): Payer: Self-pay | Admitting: *Deleted

## 2012-12-01 DIAGNOSIS — I059 Rheumatic mitral valve disease, unspecified: Secondary | ICD-10-CM | POA: Insufficient documentation

## 2012-12-01 DIAGNOSIS — I4891 Unspecified atrial fibrillation: Secondary | ICD-10-CM | POA: Insufficient documentation

## 2012-12-01 HISTORY — DX: Calculus of kidney: N20.0

## 2012-12-01 HISTORY — PX: TEE WITHOUT CARDIOVERSION: SHX5443

## 2012-12-01 SURGERY — ECHOCARDIOGRAM, TRANSESOPHAGEAL
Anesthesia: Moderate Sedation

## 2012-12-01 MED ORDER — SODIUM CHLORIDE 0.9 % IV SOLN
INTRAVENOUS | Status: DC
  Administered 2012-12-01: 500 mL via INTRAVENOUS

## 2012-12-01 MED ORDER — BUTAMBEN-TETRACAINE-BENZOCAINE 2-2-14 % EX AERO
INHALATION_SPRAY | CUTANEOUS | Status: DC | PRN
  Administered 2012-12-01: 1 via TOPICAL

## 2012-12-01 MED ORDER — MIDAZOLAM HCL 5 MG/ML IJ SOLN
INTRAMUSCULAR | Status: AC
  Filled 2012-12-01: qty 2

## 2012-12-01 MED ORDER — FENTANYL CITRATE 0.05 MG/ML IJ SOLN
INTRAMUSCULAR | Status: DC | PRN
  Administered 2012-12-01 (×2): 25 ug via INTRAVENOUS

## 2012-12-01 MED ORDER — FENTANYL CITRATE 0.05 MG/ML IJ SOLN
INTRAMUSCULAR | Status: AC
  Filled 2012-12-01: qty 4

## 2012-12-01 MED ORDER — MIDAZOLAM HCL 10 MG/2ML IJ SOLN
INTRAMUSCULAR | Status: DC | PRN
  Administered 2012-12-01: 1 mg via INTRAVENOUS
  Administered 2012-12-01 (×2): 2 mg via INTRAVENOUS

## 2012-12-01 NOTE — CV Procedure (Signed)
Lucas Porter, [name], 57 y.o., 06-05-1956  Location: [facility] ENDOSCOPY  Bed: NONE  MRN: [id number]  CSN: [id number]   INDICATIONS: Atrial fibrillation, preablation  PROCEDURE:   Informed consent was obtained prior to the procedure. The risks, benefits and alternatives for the procedure were discussed and the patient comprehended these risks.  Risks include, but are not limited to, cough, sore throat, vomiting, nausea, somnolence, esophageal and stomach trauma or perforation, bleeding, low blood pressure, aspiration, pneumonia, infection, trauma to the teeth and death.    After a procedural time-out, the oropharynx was anesthetized with 20% benzocaine spray. The patient was given 5 mg versed and 50 mcg fentanyl for moderate sedation.   The transesophageal probe was inserted in the esophagus and stomach without difficulty and multiple views were obtained.  The patient was kept under observation until the patient left the procedure room.  The patient left the procedure room in stable condition.   Agitated microbubble saline contrast was not administered.  COMPLICATIONS:    There were no immediate complications.  FINDINGS:  No evidence of left atrial thrombus.  RECOMMENDATIONS:     Proceed with ablation as scheduled.  Time Spent Directly with the Patient:  30 minutes   Kassem Kibbe 12/01/2012, 2:05 PM

## 2012-12-01 NOTE — H&P (Signed)
Date of Initial H&P: 11/07/2012, Dr. Hillis Range (see Epic note)  History reviewed, patient examined, no change in status, stable for surgery. Here for TEE preceding planned atrial fibrillation ablation. This procedure has been fully reviewed with the patient and written informed consent has been obtained.  Thurmon Fair, MD, [facility] [phone] office [phone] pager

## 2012-12-02 ENCOUNTER — Encounter (HOSPITAL_COMMUNITY)
Admission: RE | Disposition: A | Discharge status: Home / Self Care | Payer: Self-pay | Source: Ambulatory Visit | Attending: Internal Medicine

## 2012-12-02 ENCOUNTER — Ambulatory Visit (HOSPITAL_COMMUNITY)
Admission: RE | Admit: 2012-12-02 | Discharge: 2012-12-03 | Disposition: A | Discharge status: Home / Self Care | Payer: BC Managed Care – PPO | Source: Ambulatory Visit | Attending: Internal Medicine | Admitting: Internal Medicine

## 2012-12-02 ENCOUNTER — Encounter (HOSPITAL_COMMUNITY): Payer: Self-pay | Admitting: Certified Registered Nurse Anesthetist

## 2012-12-02 ENCOUNTER — Ambulatory Visit (HOSPITAL_COMMUNITY): Payer: BC Managed Care – PPO | Admitting: Certified Registered Nurse Anesthetist

## 2012-12-02 ENCOUNTER — Encounter (HOSPITAL_COMMUNITY): Payer: Self-pay | Admitting: Certified Registered"

## 2012-12-02 DIAGNOSIS — I480 Paroxysmal atrial fibrillation: Secondary | ICD-10-CM | POA: Diagnosis present

## 2012-12-02 DIAGNOSIS — E785 Hyperlipidemia, unspecified: Secondary | ICD-10-CM | POA: Insufficient documentation

## 2012-12-02 DIAGNOSIS — I4891 Unspecified atrial fibrillation: Secondary | ICD-10-CM

## 2012-12-02 DIAGNOSIS — Z950 Presence of cardiac pacemaker: Secondary | ICD-10-CM

## 2012-12-02 DIAGNOSIS — I10 Essential (primary) hypertension: Secondary | ICD-10-CM | POA: Diagnosis present

## 2012-12-02 HISTORY — PX: ATRIAL FIBRILLATION ABLATION: SHX5456

## 2012-12-02 LAB — PROTIME-INR: Prothrombin Time: 25.6 seconds — ABNORMAL HIGH (ref 11.6–15.2)

## 2012-12-02 LAB — POCT ACTIVATED CLOTTING TIME
Activated Clotting Time: 720 seconds
Activated Clotting Time: 475 seconds

## 2012-12-02 SURGERY — ATRIAL FIBRILLATION ABLATION
Anesthesia: Monitor Anesthesia Care

## 2012-12-02 MED ORDER — FENTANYL CITRATE 0.05 MG/ML IJ SOLN
INTRAMUSCULAR | Status: DC | PRN
  Administered 2012-12-02 (×2): 50 ug via INTRAVENOUS

## 2012-12-02 MED ORDER — VENLAFAXINE HCL ER 150 MG PO CP24
225.0000 mg | ORAL_CAPSULE | Freq: Every day | ORAL | Status: DC
  Administered 2012-12-03: 225 mg via ORAL
  Filled 2012-12-02: qty 1

## 2012-12-02 MED ORDER — ACETAMINOPHEN 325 MG PO TABS: 650.0000 mg | ORAL_TABLET | ORAL | Status: DC | PRN

## 2012-12-02 MED ORDER — WARFARIN SODIUM 5 MG PO TABS: 5.0000 mg | ORAL_TABLET | ORAL | Status: DC

## 2012-12-02 MED ORDER — PROPOFOL INFUSION 10 MG/ML OPTIME
INTRAVENOUS | Status: DC | PRN
  Administered 2012-12-02: 100 ug/kg/min via INTRAVENOUS

## 2012-12-02 MED ORDER — ONDANSETRON HCL 4 MG/2ML IJ SOLN: 4.0000 mg | Freq: Four times a day (QID) | INTRAMUSCULAR | Status: DC | PRN

## 2012-12-02 MED ORDER — OXYCODONE HCL 5 MG PO TABS: 5.0000 mg | ORAL_TABLET | Freq: Once | ORAL | Status: DC | PRN

## 2012-12-02 MED ORDER — DOFETILIDE 500 MCG PO CAPS
500.0000 ug | ORAL_CAPSULE | Freq: Two times a day (BID) | ORAL | Status: DC
  Administered 2012-12-02 – 2012-12-03 (×2): 500 ug via ORAL
  Filled 2012-12-02 (×3): qty 1

## 2012-12-02 MED ORDER — HYDROCODONE-ACETAMINOPHEN 5-325 MG PO TABS
1.0000 | ORAL_TABLET | ORAL | Status: DC | PRN
  Administered 2012-12-02 – 2012-12-03 (×2): 2 via ORAL
  Filled 2012-12-02 (×3): qty 2

## 2012-12-02 MED ORDER — LACTATED RINGERS IV SOLN
INTRAVENOUS | Status: DC | PRN
  Administered 2012-12-02: 07:00:00 via INTRAVENOUS

## 2012-12-02 MED ORDER — WARFARIN SODIUM 2.5 MG PO TABS: 2.5000 mg | ORAL_TABLET | Freq: Every day | ORAL | Status: DC

## 2012-12-02 MED ORDER — WARFARIN - PHYSICIAN DOSING INPATIENT: Freq: Every day | Status: DC

## 2012-12-02 MED ORDER — VENLAFAXINE HCL ER 150 MG PO CP24: 150.0000 mg | ORAL_CAPSULE | Freq: Every day | ORAL | Status: DC

## 2012-12-02 MED ORDER — BUPIVACAINE HCL (PF) 0.25 % IJ SOLN
INTRAMUSCULAR | Status: AC
  Filled 2012-12-02: qty 30

## 2012-12-02 MED ORDER — FENTANYL CITRATE 0.05 MG/ML IJ SOLN: 25.0000 ug | INTRAMUSCULAR | Status: DC | PRN

## 2012-12-02 MED ORDER — VENLAFAXINE HCL ER 75 MG PO CP24: 75.0000 mg | ORAL_CAPSULE | Freq: Every day | ORAL | Status: DC

## 2012-12-02 MED ORDER — LISINOPRIL 20 MG PO TABS
20.0000 mg | ORAL_TABLET | Freq: Every day | ORAL | Status: DC
  Administered 2012-12-03: 20 mg via ORAL
  Filled 2012-12-02 (×2): qty 1

## 2012-12-02 MED ORDER — POTASSIUM CHLORIDE CRYS ER 10 MEQ PO TBCR
10.0000 meq | EXTENDED_RELEASE_TABLET | Freq: Every day | ORAL | Status: DC
  Administered 2012-12-03: 10 meq via ORAL
  Filled 2012-12-02: qty 1

## 2012-12-02 MED ORDER — OXYCODONE HCL 5 MG/5ML PO SOLN: 5.0000 mg | Freq: Once | ORAL | Status: DC | PRN

## 2012-12-02 MED ORDER — ATENOLOL 100 MG PO TABS
100.0000 mg | ORAL_TABLET | Freq: Every day | ORAL | Status: DC
  Administered 2012-12-03: 100 mg via ORAL
  Filled 2012-12-02: qty 1

## 2012-12-02 MED ORDER — WARFARIN SODIUM 2.5 MG PO TABS
2.5000 mg | ORAL_TABLET | ORAL | Status: DC
  Administered 2012-12-02: 2.5 mg via ORAL
  Filled 2012-12-02 (×2): qty 1

## 2012-12-02 MED ORDER — PROTAMINE SULFATE 10 MG/ML IV SOLN
INTRAVENOUS | Status: DC | PRN
  Administered 2012-12-02: 40 mg via INTRAVENOUS

## 2012-12-02 MED ORDER — HEPARIN SODIUM (PORCINE) 1000 UNIT/ML IJ SOLN
INTRAMUSCULAR | Status: AC
  Filled 2012-12-02: qty 1

## 2012-12-02 MED ORDER — PANTOPRAZOLE SODIUM 40 MG PO TBEC
40.0000 mg | DELAYED_RELEASE_TABLET | Freq: Every day | ORAL | Status: DC
  Administered 2012-12-03: 40 mg via ORAL
  Filled 2012-12-02 (×2): qty 1

## 2012-12-02 MED ORDER — SODIUM CHLORIDE 0.9 % IJ SOLN: 3.0000 mL | INTRAMUSCULAR | Status: DC | PRN

## 2012-12-02 MED ORDER — MIDAZOLAM HCL 5 MG/5ML IJ SOLN
INTRAMUSCULAR | Status: DC | PRN
  Administered 2012-12-02: 2 mg via INTRAVENOUS

## 2012-12-02 MED ORDER — HYDROXYUREA 500 MG PO CAPS
ORAL_CAPSULE | ORAL | Status: AC
  Filled 2012-12-02: qty 1

## 2012-12-02 MED ORDER — SODIUM CHLORIDE 0.9 % IV SOLN: 250.0000 mL | INTRAVENOUS | Status: DC | PRN

## 2012-12-02 MED ORDER — DILTIAZEM HCL ER 180 MG PO CP24
180.0000 mg | ORAL_CAPSULE | Freq: Every day | ORAL | Status: DC
  Filled 2012-12-02 (×2): qty 1

## 2012-12-02 MED ORDER — SODIUM CHLORIDE 0.9 % IJ SOLN
3.0000 mL | Freq: Two times a day (BID) | INTRAMUSCULAR | Status: DC
  Administered 2012-12-02: 3 mL via INTRAVENOUS

## 2012-12-02 NOTE — Anesthesia Preprocedure Evaluation (Signed)
Anesthesia Evaluation  Patient identified by MRN, date of birth, ID band Patient awake    Reviewed: Allergy & Precautions, H&P , NPO status , Patient's Chart, lab work & pertinent test results  Airway Mallampati: II  Neck ROM: full    Dental   Pulmonary          Cardiovascular hypertension, + angina + CAD, + Cardiac Stents and + CABG + dysrhythmias Atrial Fibrillation     Neuro/Psych Anxiety    GI/Hepatic   Endo/Other    Renal/GU      Musculoskeletal   Abdominal   Peds  Hematology   Anesthesia Other Findings   Reproductive/Obstetrics                           Anesthesia Physical Anesthesia Plan  ASA: III  Anesthesia Plan: MAC   Post-op Pain Management:    Induction: Intravenous  Airway Management Planned: Simple Face Mask  Additional Equipment:   Intra-op Plan:   Post-operative Plan:   Informed Consent: I have reviewed the patients History and Physical, chart, labs and discussed the procedure including the risks, benefits and alternatives for the proposed anesthesia with the patient or authorized representative who has indicated his/her understanding and acceptance.     Plan Discussed with: CRNA and Surgeon  Anesthesia Plan Comments:         Anesthesia Quick Evaluation

## 2012-12-02 NOTE — Anesthesia Postprocedure Evaluation (Signed)
  Anesthesia Post-op Note  Patient: Lucas Porter  Procedure(s) Performed: Procedure(s): ATRIAL FIBRILLATION ABLATION (N/A)  Patient Location: Cath Lab  Anesthesia Type:MAC  Level of Consciousness: awake, alert  and oriented  Airway and Oxygen Therapy: Patient Spontanous Breathing  Post-op Pain: none  Post-op Assessment: Post-op Vital signs reviewed, Patient's Cardiovascular Status Stable, Respiratory Function Stable, Patent Airway, No signs of Nausea or vomiting, Adequate PO intake and Pain level controlled  Post-op Vital Signs: Reviewed and stable  Complications: No apparent anesthesia complications

## 2012-12-02 NOTE — Addendum Note (Signed)
Addendum created 12/02/12 1133 by Aubery Lapping, MD   Modules edited: Anesthesia Attestations, Anesthesia Events

## 2012-12-02 NOTE — H&P (View-Only) (Signed)
PCP: Tally Due, MD Primary Cardiologist:  [facility]  Lucas Porter is a 57 y.o. male who presents today for routine electrophysiology followup.  Since his afib ablation, the patient reports doing well.   He continues to have atypical chest pain.  He also has symptomatic afib, frequently at night.  Today, he denies symptoms of shortness of breath,  lower extremity edema, dizziness, presyncope, or syncope.  The patient is otherwise without complaint today.   Past Medical History  Diagnosis Date  . Coronary artery disease     s/p CABG 2010, s/p Successful PCI  10/17/11 of Distal Left Main and the Left Circumflex with a Promus Element DES stent -- 4.0 mm x 16 mm postdilated to 4.12 mm   . Hypertension   . Anxiety   . DDD (degenerative disc disease)   . Atrial fibrillation     paroxysmal  . S/P coronary artery stent placement to distal Lt. main and Lt. Cx 10/17/11  . 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 Coagulopathy     persistent elevation of ACT during 10/17/11 hospitalization  . Atrial flutter   . Hyperlipidemia   . Heart murmur    Past Surgical History  Procedure Laterality Date  . Coronary angioplasty  10/19/11  . Coronary artery bypass graft  2010  . Insert / replace / remove pacemaker  2/12    [employer]tronic  . Tee without cardioversion  04/23/2012    Procedure: TRANSESOPHAGEAL ECHOCARDIOGRAM (TEE);  Surgeon: Wendall Stade, MD;  Location: [facility];  Service: Cardiovascular;  Laterality: N/A;  spoke to pt about time change from 11a to 12p/DL  . Atrial fibrillation ablation  04/24/12    PVI and CTI ablation by Dr Johney Frame    Current Outpatient Prescriptions  Medication Sig Dispense Refill  . aspirin 81 MG tablet Take 81 mg by mouth daily.      Marland Kitchen atenolol (TENORMIN) 50 MG tablet Take 100 mg by mouth daily.       Marland Kitchen diltiazem (DILACOR XR) 180 MG 24 hr capsule Take 1 capsule (180 mg total) by mouth daily.  90 capsule  3  . dofetilide (TIKOSYN) 500  MCG capsule Take 1 capsule (500 mcg total) by mouth 2 (two) times daily.  180 capsule  3  . fish oil-omega-3 fatty acids 1000 MG capsule Take 2 g by mouth 2 (two) times daily.      Marland Kitchen lisinopril (PRINIVIL,ZESTRIL) 20 MG tablet TAKE ONE TABLET BY MOUTH EVERY DAY  90 tablet  0  . niacin (NIASPAN) 1000 MG CR tablet Take 1,000 mg by mouth at bedtime.      Marland Kitchen NITROSTAT 0.4 MG SL tablet as needed.      . pantoprazole (PROTONIX) 40 MG tablet TAKE ONE TABLET BY MOUTH EVERY DAY  30 tablet  0  . potassium chloride (K-DUR,KLOR-CON) 10 MEQ tablet Take 10 mEq by mouth daily.      . pravastatin (PRAVACHOL) 40 MG tablet Take 1 tablet (40 mg total) by mouth daily.  30 tablet  5  . testosterone (ANDROGEL) 50 MG/5GM GEL Increase dose to 4 packets daily.  Disp q.s for one month  300 g  5  . venlafaxine XR (EFFEXOR-XR) 150 MG 24 hr capsule TAKE ONE CAPSULE BY MOUTH EVERY DAY. TAKE WITH 75MG  TO EQUAL A TOTAL OF 225MG  DAILY  30 capsule  0  . venlafaxine XR (EFFEXOR-XR) 75 MG 24 hr capsule TAKE ONE CAPSULE BY MOUTH  EVERY DAY. TAKE WITH 150MG  TO EQUAL A TOTAL OF 225MG  DAILY  30 capsule  0  . warfarin (COUMADIN) 5 MG tablet Take 0.5-1 tablets (2.5-5 mg total) by mouth daily. Take 7.5 mg tonight (04/25/2012), then take 5 mg on Sat & Sun. Then as directed by [facility].  30 tablet  3   No current facility-administered medications for this visit.    Physical Exam: Filed Vitals:   11/07/12 0902  BP: 148/92  Pulse: 66  Height: 5\' 10"  (1.778 m)  Weight: 219 lb (99.338 kg)    GEN- The patient is well appearing, alert and oriented x 3 today.   Head- normocephalic, atraumatic Eyes-  Sclera clear, conjunctiva pink Ears- hearing intact Oropharynx- clear Lungs- Clear to ausculation bilaterally, normal work of breathing Heart- irregular rate and rhythm, no murmurs, rubs or gallops, PMI not laterally displaced GI- soft, NT, ND, + BS Extremities- no clubbing, cyanosis, or edema  Pacemaker interrogation today is reviewed (see  paper chart)  Assessment and Plan:

## 2012-12-02 NOTE — Interval H&P Note (Signed)
History and Physical Interval Note:  12/02/2012 7:39 AM  Lucas Porter  has presented today for surgery, with the diagnosis of AFib  The various methods of treatment have been discussed with the patient and family. After consideration of risks, benefits and other options for treatment, the patient has consented to  Procedure(s): ATRIAL FIBRILLATION ABLATION (N/A) as a surgical intervention .  The patient's history has been reviewed, patient examined, no change in status, stable for surgery.  I have reviewed the patient's chart and labs.  Questions were answered to the patient's satisfaction.    INR 2.4  TEE reviewed- no thrombus  Hillis Range

## 2012-12-02 NOTE — Op Note (Signed)
SURGEON:  Hillis Range, MD  PREPROCEDURE DIAGNOSES: 1. Paroxysmal atrial fibrillation.  POSTPROCEDURE DIAGNOSES: 1. Paroxysmal  atrial fibrillation.  PROCEDURES: 1. Comprehensive electrophysiologic study. 2. Coronary sinus pacing and recording. 3. Three-dimensional mapping of atrial fibrillation 4. Ablation of atrial fibrillation 5. Intracardiac echocardiography. 6. Transseptal puncture of an intact septum. 7. Rotational Angiography with processing at an independent workstation 8. Arrhythmia induction with pacing with isuprel infusion  INTRODUCTION:  Lucas Porter is a 57 y.o. male with a history of paroxysmal atrial fibrillation who now presents for EP study and radiofrequency ablation.  The patient reports initially being diagnosed with atrial fibrillation after presenting with symptomatic palpitations and fatgiue. The patient reports increasing frequency and duration of atrial fibrillation since that time.  The patient failed medical therapy and underwent afib ablation 8/13.  He has developed recurrent atrial fibrillation. The patient therefore presents today for repeat catheter ablation of atrial fibrillation.  DESCRIPTION OF PROCEDURE:  Informed written consent was obtained, and the patient was brought to the electrophysiology lab in a fasting state.  The patient was adequately sedated with intravenous medications as outlined in the anesthesia report.  The patient's left and right groins were prepped and draped in the usual sterile fashion by the EP lab staff.  Using a percutaneous Seldinger technique, two 7-French and one 11-French hemostasis sheaths were placed into the right common femoral vein.  3 Dimensional Rotational Angiography: A 5 french pigtail catheter was introduced through the right common femoral vein and advanced into the inferior venocava.  3 demential rotational angiography was then performed by power injection of 100cc of nonionic contrast.  Reprocessing at an  independent work station was then performed.   This demonstrated a large sized left atrium with 4 separate pulmonary veins which were also moderate in size.  There were no anomalous veins or significant abnormalities.  A 3 dimensional rendering of the left atrium was then merged using [employer] onto the WellPoint system and registered with intracardiac echo (see below).  The pigtail catheter was then removed.  Catheter Placement:  A 7-French Biosense Webster Decapolar coronary sinus catheter was introduced through the right common femoral vein and advanced into the coronary sinus for recording and pacing from this location.  A 6-French quadripolar Josephson catheter was introduced through the right common femoral vein and advanced into the right ventricle for recording and pacing.  This catheter was then pulled back to the His bundle location.    Initial Measurements: The patient presented to the electrophysiology lab in sinus rhythm.  His PR interval measured 184 msec with a QRS duringation of 94 msec and a QT interval of 482 msec msec.  The AH interval measured 122 msec and the HV interval measured 62 msec.     Intracardiac Echocardiography: A 10-French Biosense Webster AcuNav intracardiac echocardiography catheter was introduced through the left common femoral vein and advanced into the right atrium. Intracardiac echocardiography was performed of the left atrium, and a three-dimensional anatomical rendering of the left atrium was performed using CARTO sound technology.  The patient was noted to have a large sized left atrium.  The interatrial septum was prominent but not aneurysmal. All 4 pulmonary veins were visualized and noted to have separate ostia.  The left pulmonary veins were moderate in size.  The right superior pulmonary vein was large in size and the right inferior pulmonary vein was small.  The left atrial appendage was visualized and did not reveal thrombus.   There was no  evidence of pulmonary vein stenosis.   Transseptal Puncture: The middle right common femoral vein sheath was exchanged for an 8.5 [place] SL2 transseptal sheath and transseptal access was achieved in a standard fashion using a Brockenbrough needle under biplane fluoroscopy with intracardiac echocardiography confirmation of the transseptal puncture.  Once transseptal access had been achieved, heparin was administered intravenously and intra- arterially in order to maintain an ACT of greater than 400 seconds throughout the procedure.  The baseline ACT prior to any heparin was 394 seconds.  3D Mapping and Ablation: The His bundle catheter was removed and in its place a 3.5 mm Biosense Webster EZ [employer] ablation catheter was advanced into the right atrium.  The transseptal sheath was pulled back into the IVC over a guidewire.  The ablation catheter was advanced across the transseptal hole using the wire as a guide.  The transseptal sheath was then re-advanced over the guidewire into the left atrium.  A duodecapolar Biosense Webster circular mapping catheter was introduced through the transseptal sheath and positioned over the mouth of all 4 pulmonary veins.  Three-dimensional electroanatomical mapping was performed using CARTO technology.  This demonstrated that the left superior and left inferior pulmonary veins were quiescent from the prior ablation procedure.  There was return of electrical activity within the right superior and inferior pulmonary veins at baseline. The patient underwent successful electrical reisolation and anatomical encircling of the right superior and right inferior pulmonary veins using radiofrequency current with a circular mapping catheter as a guide.   Measurements Following Ablation: Following ablation, Isuprel was infused up to 20 mcg/min with no inducible atrial fibrillation, atrial tachycardia, atrial flutter, or sustained PACs.  Ventricular pacing was performed, which  revealed VA dissociation when pacing at a cycle length of 600 msec.  Rapid atrial pacing was performed, which revealed an AV Wenckebach cycle length of 410 msec.  Electroisolation was then again confirmed in all four pulmonary veins.  Rapid atrial pacing was continued down to a cycle length of 220 msec with no arrhythmias induced.  Differential atrial pacing was performed from the low lateral right atrium which confirmed CTI block from the prior ablation procedure. The patient's pacemaker was interrogated following the procedure and battery status was good (2.99V).  Atrial and ventricular pacing/sensing/ impedance measurements were unchanged from baseline.  No programming changes were made today. The procedure was therefore considered completed.  All catheters were removed, and the sheaths were aspirated and flushed.  The patient was transferred to the recovery area for sheath removal per protocol.  A limited bedside transthoracic echocardiogram revealed no pericardial effusion.  There were no early apparent complications.  CONCLUSIONS: 1. Sinus rhythm upon presentation.   2. Rotational Angiography reveals a large sized left atrium with four separate pulmonary veins without evidence of pulmonary vein stenosis. 3. The left superior and left inferior pulmonary veins were quiescent from the prior ablation procedure.  There was return of electrical activity within the right superior and inferior pulmonary veins. Successful electrical reisolation and anatomical encircling of the right superior and right inferior pulmonary veins using radiofrequency current today. 4. CTI block confirmed from a prior ablation procedure 5. No inducible arrhythmias following ablation both on and off of Isuprel 6. No early apparent complications.   Lilian Fuhs,MD 10:10 AM 12/02/2012

## 2012-12-02 NOTE — Transfer of Care (Signed)
Immediate Anesthesia Transfer of Care Note  Patient: Lucas Porter  Procedure(s) Performed: Procedure(s): ATRIAL FIBRILLATION ABLATION (N/A)  Patient Location: Cath Lab  Anesthesia Type:MAC  Level of Consciousness: awake, alert  and oriented  Airway & Oxygen Therapy: Patient Spontanous Breathing  Post-op Assessment: Report given to PACU RN, Post -op Vital signs reviewed and stable and Patient moving all extremities  Post vital signs: Reviewed and stable  Complications: No apparent anesthesia complications

## 2012-12-02 NOTE — Preoperative (Signed)
Beta Blockers   Reason not to administer Beta Blockers:B blocker taken this am 

## 2012-12-03 ENCOUNTER — Ambulatory Visit: Payer: Self-pay | Admitting: Cardiovascular Disease

## 2012-12-03 DIAGNOSIS — Z7901 Long term (current) use of anticoagulants: Secondary | ICD-10-CM | POA: Insufficient documentation

## 2012-12-03 LAB — BASIC METABOLIC PANEL
BUN: 12 mg/dL (ref 6–23)
CO2: 29 mEq/L (ref 19–32)
Calcium: 8.8 mg/dL (ref 8.4–10.5)
Chloride: 103 mEq/L (ref 96–112)
Creatinine, Ser: 0.97 mg/dL (ref 0.50–1.35)
Glucose, Bld: 100 mg/dL — ABNORMAL HIGH (ref 70–99)

## 2012-12-03 NOTE — Discharge Summary (Signed)
ELECTROPHYSIOLOGY DISCHARGE SUMMARY    Patient ID: Lucas Porter,  MRN: [id number], DOB/AGE: Oct 10, 1955 57 y.o.  Admit date: 12/02/2012 Discharge date: 12/03/2012  Primary Care Physician: Robert Bellow, MD Primary Cardiologist: Thurmon Fair, MD  Primary Discharge Diagnosis:  1. Symptomatic AF s/p AF ablation  Secondary Discharge Diagnoses:  1. CAD s/p CABG 2010, PCI to dLM and LCx Jan 2013 2. HTN 3. Tachy-brady s/p PPM implant 2012 4. Atrial flutter 5. Dyslipidemia 6. Anxiety 7. DDD  Procedures This Admission:  1. Comprehensive electrophysiologic study.  2. Coronary sinus pacing and recording.  3. Three-dimensional mapping of atrial fibrillation  4. Ablation of atrial fibrillation  5. Intracardiac echocardiography.  6. Transseptal puncture of an intact septum.  7. Rotational Angiography with processing at an independent workstation  8. Arrhythmia induction with pacing with isuprel infusion 1. Sinus rhythm upon presentation.  2. Rotational Angiography reveals a large sized left atrium with four separate pulmonary veins without evidence of pulmonary vein stenosis.  3. The left superior and left inferior pulmonary veins were quiescent from the prior ablation procedure. There was return of electrical activity within the right superior and inferior pulmonary veins. Successful electrical reisolation and anatomical encircling of the right superior and right inferior pulmonary veins using radiofrequency current today.  4. CTI block confirmed from a prior ablation procedure  5. No inducible arrhythmias following ablation both on and off of Isuprel  6. No early apparent complications.   History and Hospital Course:  Lucas Porter is a 57 year old man with a history of paroxysmal atrial fibrillation who reports initially being diagnosed with atrial fibrillation after presenting with symptomatic palpitations and fatgiue. [name] reports increasing frequency and duration of atrial  fibrillation since that time. He has failed medical therapy and underwent afib ablation August 2013. He has developed recurrent atrial fibrillation. He therefore presented yesterday for repeat catheter ablation of atrial fibrillation. [name] tolerated this procedure well without any immediate complication. He remains hemodynamically stable and afebrile. His groin site is intact without significant bleeding or hematoma. Telemetry shows normal sinus rhythm. He has been given discharge instructions including wound care and activity restrictions. He will follow-up in clinic in 12 weeks. He has been seen, examined and deemed stable for discharge today by Dr. Hillis Range.  Physical Exam: Vitals: Blood pressure 145/80, pulse 63, temperature 97.8 F (36.6 C), temperature source Oral, resp. rate 12, height 5\' 10"  (1.778 m), weight 210 lb (95.255 kg), SpO2 95.00%.  General: WD, well appearing 57 year old male in no acute distress. Heart: RRR. S1, S2 without murmur, rub or S3. Lungs: CTA bilaterally. No wheezes, rales or rhonchi. Abdomen: Soft, nondistended. Extremities: No cyanosis, clubbing or edema. Right groin tender to palpation but intact without bleeding or hematoma.  Labs: Lab Results  Component Value Date   WBC 7.6 11/28/2012   HGB 18.3* 11/28/2012   HCT 54.0* 11/28/2012   MCV 91.5 11/28/2012   PLT 169.0 11/28/2012     Recent Labs Lab 12/03/12 0420  NA 139  K 4.6  CL 103  CO2 29  BUN 12  CREATININE 0.97  CALCIUM 8.8  GLUCOSE 100*    Recent Labs  12/03/12 0420  INR 2.77*    Disposition:  The patient is being discharged in stable condition.  Follow-up:     Follow-up Information   Follow up with Hillis Range, MD On 03/09/2013. (At 9:45 AM)    Contact information:   [address]  [place] [address] [phone]      Follow up with [facility]. Schedule an appointment as soon as possible for a visit in 1 week. (For Coumadin  follow-up)    Contact information:   [address]  [address] [place] [address] [phone]     Discharge Medications:    Medication List    TAKE these medications       aspirin 81 MG tablet  Take 81 mg by mouth daily.     atenolol 50 MG tablet  Commonly known as:  TENORMIN  Take 100 mg by mouth daily.     diltiazem 180 MG 24 hr capsule  Commonly known as:  DILACOR XR  Take 1 capsule (180 mg total) by mouth daily.     diphenhydramine-acetaminophen 25-500 MG Tabs  Commonly known as:  TYLENOL PM  Take 4 tablets by mouth at bedtime as needed (sleep).     dofetilide 500 MCG capsule  Commonly known as:  TIKOSYN  Take 1 capsule (500 mcg total) by mouth 2 (two) times daily.     fish oil-omega-3 fatty acids 1000 MG capsule  Take 2 g by mouth 2 (two) times daily.     lisinopril 20 MG tablet  Commonly known as:  PRINIVIL,ZESTRIL  Take 20 mg by mouth daily.     niacin 1000 MG CR tablet  Commonly known as:  NIASPAN  Take 1,000 mg by mouth at bedtime.     nitroGLYCERIN 0.4 MG SL tablet  Commonly known as:  NITROSTAT  Place 0.4 mg under the tongue every 5 (five) minutes as needed for chest pain.     pantoprazole 40 MG tablet  Commonly known as:  PROTONIX  Take 40 mg by mouth daily.     potassium chloride 10 MEQ tablet  Commonly known as:  K-DUR,KLOR-CON  Take 10 mEq by mouth daily.     pravastatin 40 MG tablet  Commonly known as:  PRAVACHOL  Take 1 tablet (40 mg total) by mouth daily.     testosterone 50 MG/5GM Gel  Commonly known as:  ANDROGEL  Increase dose to 4 packets daily.  Disp q.s for one month     venlafaxine XR 75 MG 24 hr capsule  Commonly known as:  EFFEXOR-XR  TAKE ONE CAPSULE BY MOUTH EVERY DAY; TAKE WITH 150MG  TO EQUAL A TOTAL OF 225MG  DAILY     venlafaxine XR 150 MG 24 hr capsule  Commonly known as:  EFFEXOR-XR  TAKE ONE CAPSULE BY MOUTH EVERY DAY; TAKE WITH 75MG  TO EQUAL A TOTAL OF 225MG  DAILY     warfarin 5 MG tablet  Commonly known  as:  COUMADIN  Take 2.5-5 mg by mouth daily. Take 5mg  on Thursday. Take 2.5 mg the rest of the week       Duration of Discharge Encounter: Greater than 30 minutes including physician time.  Limmie Patricia, PA-C 12/03/2012, 7:39 AM   Hillis Range, MD

## 2012-12-03 NOTE — Progress Notes (Signed)
Pt. D/C home via wheelchair with wife.  D/C instructions given and pt stated understanding.

## 2012-12-04 ENCOUNTER — Telehealth: Payer: Self-pay | Admitting: Internal Medicine

## 2012-12-04 ENCOUNTER — Encounter: Payer: Self-pay | Admitting: Internal Medicine

## 2012-12-04 NOTE — Telephone Encounter (Signed)
New problem    Need a note to return to work.  D/c on hospital on yesterday.

## 2012-12-04 NOTE — Telephone Encounter (Signed)
Called patient's wife who states Dr. 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   Letter typed and faxed to [facility] @ [phone]

## 2012-12-05 NOTE — Telephone Encounter (Signed)
Note done and faxed.

## 2012-12-08 ENCOUNTER — Encounter: Payer: Self-pay | Admitting: *Deleted

## 2012-12-08 ENCOUNTER — Telehealth: Payer: Self-pay | Admitting: Internal Medicine

## 2012-12-08 NOTE — Telephone Encounter (Signed)
Corrected letter sent.

## 2012-12-08 NOTE — Telephone Encounter (Signed)
New Prob   Pt needs a new note clearing him to go back to work. States the date was incorrect on first note.   Fax [phone] Attn: Delfin Gant

## 2012-12-09 ENCOUNTER — Other Ambulatory Visit: Payer: Self-pay | Admitting: Radiology

## 2012-12-09 NOTE — Telephone Encounter (Signed)
Pharmacy advised patient needs office visit for refills.

## 2012-12-10 ENCOUNTER — Ambulatory Visit: Payer: BC Managed Care – PPO

## 2012-12-10 ENCOUNTER — Other Ambulatory Visit: Payer: Self-pay | Admitting: Physician Assistant

## 2012-12-10 ENCOUNTER — Ambulatory Visit (INDEPENDENT_AMBULATORY_CARE_PROVIDER_SITE_OTHER): Payer: BC Managed Care – PPO | Admitting: Family Medicine

## 2012-12-10 ENCOUNTER — Other Ambulatory Visit: Payer: Self-pay | Admitting: Cardiology

## 2012-12-10 DIAGNOSIS — L03019 Cellulitis of unspecified finger: Secondary | ICD-10-CM

## 2012-12-10 DIAGNOSIS — F32A Depression, unspecified: Secondary | ICD-10-CM

## 2012-12-10 DIAGNOSIS — F329 Major depressive disorder, single episode, unspecified: Secondary | ICD-10-CM

## 2012-12-10 DIAGNOSIS — M79609 Pain in unspecified limb: Secondary | ICD-10-CM

## 2012-12-10 MED ORDER — VENLAFAXINE HCL ER 150 MG PO CP24: ORAL_CAPSULE | ORAL | Status: DC

## 2012-12-10 MED ORDER — PANTOPRAZOLE SODIUM 40 MG PO TBEC: 40.0000 mg | DELAYED_RELEASE_TABLET | Freq: Every day | ORAL | Status: DC

## 2012-12-10 MED ORDER — CEPHALEXIN 500 MG PO CAPS: 500.0000 mg | ORAL_CAPSULE | Freq: Two times a day (BID) | ORAL | Status: DC

## 2012-12-10 MED ORDER — VENLAFAXINE HCL ER 75 MG PO CP24: ORAL_CAPSULE | ORAL | Status: DC

## 2012-12-10 NOTE — Patient Instructions (Addendum)
Use the antibiotic as directed for your finger.  Start soaking your finger starting tomorrow. Let us know if your finger is not feeling better in the next few days.  If you are worried about hurting yourself, please get help right away- please come and see [place] or call 911 or go to the ER.

## 2012-12-10 NOTE — Progress Notes (Signed)
Procedure:  Consent obtained.  Small darker area at tip of nail on R index finger.  MC block with 1% lido and marcaine 1:1 ratio to achieve local anesthesia.  Betadine prep to area.  #11 blade used to make 1 cm incision near nail edge.  Bloody purulent discharge from area - taken for wound culture.  Area explored and no FB seen or expressed.  Drsg placed over wound.  Pt tolerated well.

## 2012-12-10 NOTE — Progress Notes (Signed)
[facility] [address], [place] [address] [phone]  Date:  12/10/2012   Name:  Lucas Porter   DOB:  06-11-1956   MRN:  [id number]  PCP:  Tally Due, MD    Chief Complaint: Hand Pain   History of Present Illness:  Lucas Porter is a 57 y.o. very pleasant male patient who presents with the following:  He was admitted to [facility] earlier this month overnight for an ablation for a. Fib.  History of CABG 2010, PCI 09/2011, HTN.   He is here today with concern about a ?tiny piece of metal under the nail of his right index finger for about six months.  Over the last few weeks the tip of the finger has become more sensitive, and it hurts to fully extend the finger.    No drainage, no pus.  He has not noted any redness, but does note some dark discoloration of the tip of the finger.  He is also concerned about bruising and a "hard spot" in his right groin cath site.  He is not having much pain  Last tetanus was in 2007  Patient Active Problem List  Diagnosis  . CAD (coronary artery disease) with CABG 2011, LIMA to LAD, Free radial to Lt. PDA, VG to Diag and ramus intermedius  . Anxiety  . Paroxysmal atrial fibrillation  . Hypertension  . Dyslipidemia  . Hypogonadism male  . S/P coronary artery stent placement to distal Lt. main and Lt. Cx, 10/17/11  . Angina pectoris  . Coagulopathy  . History of pacemaker, with a-v pacing  . Atrial flutter  . BMI 40.0-44.9, adult  . Long term (current) use of anticoagulants    Past Medical History  Diagnosis Date  . Coronary artery disease     s/p CABG 2010, s/p Successful PCI  10/17/11 of Distal Left Main and the Left Circumflex with a Promus Element DES stent -- 4.0 mm x 16 mm postdilated to 4.12 mm   . Hypertension   . Anxiety   . DDD (degenerative disc disease)   . Atrial fibrillation     paroxysmal  . S/P coronary artery stent placement to distal Lt. main and Lt. Cx 10/17/11  .  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 Coagulopathy     persistent elevation of ACT during 10/17/11 hospitalization  . Atrial flutter   . Hyperlipidemia   . Heart murmur   . Kidney stones     Past Surgical History  Procedure Laterality Date  . Coronary angioplasty  10/19/11  . Coronary artery bypass graft  2010  . Insert / replace / remove pacemaker  2/12    Medtronic  . Tee without cardioversion  04/23/2012    Procedure: TRANSESOPHAGEAL ECHOCARDIOGRAM (TEE);  Surgeon: Wendall Stade, MD;  Location: [facility] ENDOSCOPY;  Service: Cardiovascular;  Laterality: N/A;  spoke to pt about time change from 11a to 12p/DL  . Atrial fibrillation ablation  04/24/12    PVI and CTI ablation by Dr Johney Frame  . Tee without cardioversion N/A 12/01/2012    Procedure: TRANSESOPHAGEAL ECHOCARDIOGRAM (TEE);  Surgeon: Thurmon Fair, MD;  Location: [facility] ENDOSCOPY;  Service: Cardiovascular;  Laterality: N/A;  . Ablation of dysrhythmic focus  12/02/2012    Dr Johney Frame    History  Substance Use Topics  . Smoking status: Former Smoker -- 1 years    Types: Cigarettes  . Smokeless tobacco: Former Neurosurgeon  Quit date: 10/03/2012     Comment: quit smoking in 1970's  . Alcohol Use: 0.6 oz/week    1 Cans of beer per week     Comment: rare    Family History  Problem Relation Age of Onset  . Coronary artery disease    . Stroke      No Known Allergies  Medication list has been reviewed and updated.  Current Outpatient Prescriptions on File Prior to Visit  Medication Sig Dispense Refill  . aspirin 81 MG tablet Take 81 mg by mouth daily.      Marland Kitchen atenolol (TENORMIN) 50 MG tablet Take 100 mg by mouth daily.      Marland Kitchen diltiazem (DILACOR XR) 180 MG 24 hr capsule Take 1 capsule (180 mg total) by mouth daily.  90 capsule  3  . dofetilide (TIKOSYN) 500 MCG capsule Take 1 capsule (500 mcg total) by mouth 2 (two) times daily.  180 capsule  3  . fish oil-omega-3 fatty acids 1000 MG capsule Take 2 g by  mouth 2 (two) times daily.      Marland Kitchen lisinopril (PRINIVIL,ZESTRIL) 20 MG tablet Take 20 mg by mouth daily.      . niacin (NIASPAN) 1000 MG CR tablet Take 1,000 mg by mouth at bedtime.      . nitroGLYCERIN (NITROSTAT) 0.4 MG SL tablet Place 0.4 mg under the tongue every 5 (five) minutes as needed for chest pain.      . pantoprazole (PROTONIX) 40 MG tablet Take 40 mg by mouth daily.      . potassium chloride (K-DUR,KLOR-CON) 10 MEQ tablet Take 10 mEq by mouth daily.      . pravastatin (PRAVACHOL) 40 MG tablet Take 1 tablet (40 mg total) by mouth daily.  30 tablet  5  . testosterone (ANDROGEL) 50 MG/5GM GEL Increase dose to 4 packets daily.  Disp q.s for one month  300 g  5  . venlafaxine XR (EFFEXOR-XR) 150 MG 24 hr capsule TAKE ONE CAPSULE BY MOUTH EVERY DAY; TAKE WITH 75MG  TO EQUAL A TOTAL OF 225MG  DAILY  30 capsule  0  . venlafaxine XR (EFFEXOR-XR) 75 MG 24 hr capsule TAKE ONE CAPSULE BY MOUTH EVERY DAY; TAKE WITH 150MG  TO EQUAL A TOTAL OF 225MG  DAILY  30 capsule  0  . warfarin (COUMADIN) 5 MG tablet Take 2.5-5 mg by mouth daily. Take 5mg  on Thursday. Take 2.5 mg the rest of the week       No current facility-administered medications on file prior to visit.    Review of Systems:  As per HPI- otherwise negative.   Physical Examination: Filed Vitals:   12/10/12 1150  BP: 154/95  Pulse: 88  Temp: 98.1 F (36.7 C)  Resp: 18   Filed Vitals:   12/10/12 1150  Weight: 219 lb (99.338 kg)   Body mass index is 31.42 kg/(m^2). Ideal Body Weight:    GEN: WDWN, NAD, Non-toxic, A & O x 3 HEENT: Atraumatic, Normocephalic. Neck supple. No masses, No LAD. Ears and Nose: No external deformity. CV: RRR, No M/G/R. No JVD. No thrill. No extra heart sounds.  He is in sinus rhythm currentlyu PULM: CTA B, no wheezes, crackles, rhonchi. No retractions. No resp. distress. No accessory muscle use. ABD: S, NT, ND, +BS. No rebound. No HSM. EXTR: No c/c/e NEURO Normal gait.  PSYCH: Normally interactive.  Conversant. Not depressed or anxious appearing.  Calm demeanor.  Right groin- extensive bruising from recent cath, but no sign of  abnormal healing, no hematoma, no swelling The tip of his right index finger is tender, but there is no visible FB.  He notes spot tenderness over one particular area.    [facility] reading (PRIMARY) by  Dr. Patsy Lager. Right hand: no definite foreign body noted.    Pt wishes to proceed with search for a splinter.  He is aware of risk of bleeding or infection from procedure.  See note by Benny Lennert, PA-C.    Assessment and Plan: Pain in finger, right - Plan: DG Finger Index Right  Cellulitis, finger, right - Plan: cephALEXin (KEFLEX) 500 MG capsule, Wound culture  Depression - Plan: venlafaxine XR (EFFEXOR-XR) 75 MG 24 hr capsule, venlafaxine XR (EFFEXOR-XR) 150 MG 24 hr capsule  Removed purulent material from end of finger, as per note by Benny Lennert, PA-C.  Went in to room to give pt an rx for keflex.  At that time he revealed that he had lost his job/ health insurance this morning, was very concerned about continuing to afford his medicines and that he was afraid he was a burden to his wife. This caused him to feel very upset, anxious, and uncertain "If I can go on anymore." Discussed this in detail, and called his wife on the phone.  She gave Hence the good news that he will probably be able to be added to her insurance plan.  Lucas Porter assured me that he does not plan to harm himself, "I'm just having a bad day" and did agree to seek care if he does have any suicidal ideas.  Refilled his effexor today as well.  Discussed wound care of his finger  Meds ordered this encounter  Medications  . clopidogrel (PLAVIX) 75 MG tablet    Sig: Take 75 mg by mouth daily.  . cephALEXin (KEFLEX) 500 MG capsule    Sig: Take 1 capsule (500 mg total) by mouth 2 (two) times daily.    Dispense:  10 capsule    Refill:  0  . venlafaxine XR (EFFEXOR-XR) 75 MG 24 hr capsule    Sig: TAKE  ONE CAPSULE BY MOUTH EVERY DAY; TAKE WITH 150MG  TO EQUAL A TOTAL OF 225MG  DAILY    Dispense:  30 capsule    Refill:  5  . venlafaxine XR (EFFEXOR-XR) 150 MG 24 hr capsule    Sig: TAKE ONE CAPSULE BY MOUTH EVERY DAY; TAKE WITH 75MG  TO EQUAL A TOTAL OF 225MG  DAILY    Dispense:  30 capsule    Refill:  5      Signed Abbe Amsterdam, MD

## 2012-12-11 ENCOUNTER — Telehealth: Payer: Self-pay | Admitting: Family Medicine

## 2012-12-11 NOTE — Telephone Encounter (Signed)
Called to check on him- LMOM.  Asked him to please let us know if he needs anything, or if there is anything we can do to help.  Also relayed radiology report re: FB in his wrist by the 2nd MC.  This is not the area that was bothering him,  And is unlikely to be any concern but I wanted to pass this information along.

## 2012-12-13 LAB — WOUND CULTURE
Gram Stain: NONE SEEN
Gram Stain: NONE SEEN
Gram Stain: NONE SEEN
Organism ID, Bacteria: NO GROWTH

## 2013-01-28 ENCOUNTER — Other Ambulatory Visit: Payer: Self-pay | Admitting: Emergency Medicine

## 2013-01-28 NOTE — Progress Notes (Signed)
PA approved for Androgel from 12/29/12 - 01/28/14. Faxed pharmacy.

## 2013-02-03 ENCOUNTER — Telehealth: Payer: Self-pay | Admitting: Cardiovascular Disease

## 2013-02-03 NOTE — Telephone Encounter (Signed)
Can not afford The Androgel-Please write a new prescription for something else!-You can fax or call [facility] in [place]!

## 2013-02-04 ENCOUNTER — Other Ambulatory Visit: Payer: Self-pay | Admitting: Emergency Medicine

## 2013-02-04 ENCOUNTER — Other Ambulatory Visit: Payer: Self-pay

## 2013-02-04 DIAGNOSIS — E291 Testicular hypofunction: Secondary | ICD-10-CM

## 2013-02-04 NOTE — Telephone Encounter (Signed)
Dr Dareen Piano, do you want to RF x 1 mos w/note that pt needs to RTC? I have pended the Rx to print for your signature if you agree.

## 2013-02-05 MED ORDER — TESTOSTERONE 50 MG/5GM (1%) TD GEL: TRANSDERMAL | Status: DC

## 2013-02-07 NOTE — Telephone Encounter (Signed)
Only option is injected testosterone that requires a twice monthly shot.  Find out if that is acceptable to him

## 2013-02-09 ENCOUNTER — Other Ambulatory Visit: Payer: Self-pay | Admitting: Emergency Medicine

## 2013-02-13 ENCOUNTER — Other Ambulatory Visit: Payer: Self-pay | Admitting: Emergency Medicine

## 2013-02-13 MED ORDER — PRAVASTATIN SODIUM 40 MG PO TABS: 40.0000 mg | ORAL_TABLET | Freq: Every day | ORAL | Status: DC

## 2013-03-02 ENCOUNTER — Other Ambulatory Visit: Payer: Self-pay | Admitting: *Deleted

## 2013-03-02 MED ORDER — POTASSIUM CHLORIDE CRYS ER 10 MEQ PO TBCR: 10.0000 meq | EXTENDED_RELEASE_TABLET | Freq: Every day | ORAL | Status: DC

## 2013-03-04 ENCOUNTER — Other Ambulatory Visit: Payer: Self-pay | Admitting: *Deleted

## 2013-03-09 ENCOUNTER — Encounter: Payer: Self-pay | Admitting: Internal Medicine

## 2013-03-09 ENCOUNTER — Ambulatory Visit (INDEPENDENT_AMBULATORY_CARE_PROVIDER_SITE_OTHER): Payer: BC Managed Care – PPO | Admitting: Internal Medicine

## 2013-03-09 VITALS — BP 147/88 | HR 77 | Ht 70.0 in | Wt 213.4 lb

## 2013-03-09 DIAGNOSIS — I498 Other specified cardiac arrhythmias: Secondary | ICD-10-CM

## 2013-03-09 DIAGNOSIS — Z7901 Long term (current) use of anticoagulants: Secondary | ICD-10-CM

## 2013-03-09 DIAGNOSIS — I4891 Unspecified atrial fibrillation: Secondary | ICD-10-CM

## 2013-03-09 DIAGNOSIS — Z950 Presence of cardiac pacemaker: Secondary | ICD-10-CM

## 2013-03-09 DIAGNOSIS — R001 Bradycardia, unspecified: Secondary | ICD-10-CM

## 2013-03-09 DIAGNOSIS — Z8679 Personal history of other diseases of the circulatory system: Secondary | ICD-10-CM

## 2013-03-09 DIAGNOSIS — I480 Paroxysmal atrial fibrillation: Secondary | ICD-10-CM

## 2013-03-09 LAB — PACEMAKER DEVICE OBSERVATION
AL IMPEDENCE PM: 552 Ohm
AL THRESHOLD: 1 V
BATTERY VOLTAGE: 2.99 V
RV LEAD AMPLITUDE: 20 mv
RV LEAD IMPEDENCE PM: 536 Ohm

## 2013-03-09 NOTE — Progress Notes (Signed)
PCP: Tally Due, MD Primary Cardiologist:  Dr Thurman Coyer is a 57 y.o. male who presents today for routine electrophysiology followup.  Since his afib ablation, the patient reports doing very well.   He denies procedure related complications.  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  He feels that his afib has improved.  Today, he denies symptoms of palpitations, chest pain, shortness of breath, lower extremity edema, dizziness, presyncope, or syncope.  The patient is otherwise without complaint today.   Past Medical History  Diagnosis Date  . Coronary artery disease     s/p CABG 2010, s/p Successful PCI  10/17/11 of Distal Left Main and the Left Circumflex with a Promus Element DES stent -- 4.0 mm x 16 mm postdilated to 4.12 mm   . Hypertension   . Anxiety   . DDD (degenerative disc disease)   . Atrial fibrillation     paroxysmal  . S/P coronary artery stent placement to distal Lt. main and Lt. Cx 10/17/11  . 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 Coagulopathy     persistent elevation of ACT during 10/17/11 hospitalization  . Atrial flutter   . Hyperlipidemia   . Heart murmur   . Kidney stones    Past Surgical History  Procedure Laterality Date  . Coronary angioplasty  10/19/11  . Coronary artery bypass graft  2010  . Insert / replace / remove pacemaker  2/12    Medtronic  . Tee without cardioversion  04/23/2012    Procedure: TRANSESOPHAGEAL ECHOCARDIOGRAM (TEE);  Surgeon: Wendall Stade, MD;  Location: [facility] ENDOSCOPY;  Service: Cardiovascular;  Laterality: N/A;  spoke to pt about time change from 11a to 12p/DL  . Atrial fibrillation ablation  04/24/12, 12/02/12    PVI x2 and CTI ablation by Dr Johney Frame  . Tee without cardioversion N/A 12/01/2012    Procedure: TRANSESOPHAGEAL ECHOCARDIOGRAM (TEE);  Surgeon: Thurmon Fair, MD;  Location: [facility] ENDOSCOPY;  Service: Cardiovascular;  Laterality: N/A;     Current Outpatient Prescriptions  Medication Sig Dispense Refill  . aspirin 81 MG tablet Take 81 mg by mouth daily.      Marland Kitchen atenolol (TENORMIN) 50 MG tablet Take 100 mg by mouth daily.      . cephALEXin (KEFLEX) 500 MG capsule Take 1 capsule (500 mg total) by mouth 2 (two) times daily.  10 capsule  0  . clopidogrel (PLAVIX) 75 MG tablet Take 75 mg by mouth daily.      Marland Kitchen diltiazem (DILACOR XR) 180 MG 24 hr capsule Take 1 capsule (180 mg total) by mouth daily.  90 capsule  3  . dofetilide (TIKOSYN) 500 MCG capsule Take 1 capsule (500 mcg total) by mouth 2 (two) times daily.  180 capsule  3  . fish oil-omega-3 fatty acids 1000 MG capsule Take 2 g by mouth 2 (two) times daily.      Marland Kitchen lisinopril (PRINIVIL,ZESTRIL) 20 MG tablet Take 20 mg by mouth daily.      Marland Kitchen lisinopril (PRINIVIL,ZESTRIL) 20 MG tablet Take 1 tablet (20 mg total) by mouth daily. PATIENT NEEDS OFFICE VISIT FOR ADDITIONAL REFILLS  30 tablet  0  . niacin (NIASPAN) 1000 MG CR tablet Take 1,000 mg by mouth at bedtime.      . nitroGLYCERIN (NITROSTAT) 0.4 MG SL tablet Place 0.4 mg under the tongue every 5 (five) minutes as needed for chest pain.      Marland Kitchen  pantoprazole (PROTONIX) 40 MG tablet Take 1 tablet (40 mg total) by mouth daily.  30 tablet  5  . potassium chloride (K-DUR,KLOR-CON) 10 MEQ tablet Take 1 tablet (10 mEq total) by mouth daily.  30 tablet  1  . pravastatin (PRAVACHOL) 40 MG tablet Take 1 tablet (40 mg total) by mouth daily.  30 tablet  5  . testosterone (ANDROGEL) 50 MG/5GM GEL Increase dose to 4 packets daily.  Disp q.s for one month. PATIENT NEEDS OFFICE VISIT FOR ADDITIONAL REFILLS  120 Package  0  . venlafaxine XR (EFFEXOR-XR) 150 MG 24 hr capsule TAKE ONE CAPSULE BY MOUTH ONCE DAILY. TAKE WITH 75MG  TO EQUAL A TOTAL OF 225MG  DAILY.  30 capsule  0  . venlafaxine XR (EFFEXOR-XR) 150 MG 24 hr capsule TAKE ONE CAPSULE BY MOUTH EVERY DAY; TAKE WITH 75MG  TO EQUAL A TOTAL OF 225MG  DAILY  30 capsule  5  . venlafaxine XR  (EFFEXOR-XR) 75 MG 24 hr capsule TAKE ONE CAPSULE BY MOUTH EVERY DAY; TAKE WITH 150MG  TO EQUAL A TOTAL OF 225MG  DAILY  30 capsule  5  . warfarin (COUMADIN) 5 MG tablet Take 2.5-5 mg by mouth daily. Take 5mg  on Thursday. Take 2.5 mg the rest of the week       No current facility-administered medications for this visit.    Physical Exam: Filed Vitals:   03/09/13 1002  BP: 147/88  Pulse: 77  Height: 5\' 10"  (1.778 m)  Weight: 213 lb 6.4 oz (96.798 kg)    GEN- The patient is well appearing, alert and oriented x 3 today.   Head- normocephalic, atraumatic Eyes-  Sclera clear, conjunctiva pink Ears- hearing intact Oropharynx- clear Lungs- Clear to ausculation bilaterally, normal work of breathing Heart- Regular rate and rhythm, no murmurs, rubs or gallops, PMI not laterally displaced GI- soft, NT, ND, + BS Extremities- no clubbing, cyanosis, or edema  ekg today reveals atrial pacing 66 bpm, nonspecific ST/T changes  Assessment and Plan:  1. Afib Doing well s/p ablation Ppm interrogation today reveals that he has had some ERAF.  For now, lets continue to follow and see how he does No changes are made today  2. Symptomatic bradycardia Normal pacemaker function See [facility]rit[facility] Miss Art report No changes today  Return in 3 months

## 2013-03-09 NOTE — Patient Instructions (Addendum)
Your physician recommends that you schedule a follow-up appointment in 3 months with Dr Allred    

## 2013-03-25 ENCOUNTER — Other Ambulatory Visit: Payer: Self-pay | Admitting: Cardiovascular Disease

## 2013-03-25 ENCOUNTER — Other Ambulatory Visit: Payer: Self-pay | Admitting: Physician Assistant

## 2013-03-25 NOTE — Telephone Encounter (Signed)
Rx was sent to pharmacy electronically. 

## 2013-03-25 NOTE — Telephone Encounter (Signed)
Needs OV.  

## 2013-03-26 ENCOUNTER — Encounter: Payer: Self-pay | Admitting: Family Medicine

## 2013-03-26 ENCOUNTER — Ambulatory Visit (INDEPENDENT_AMBULATORY_CARE_PROVIDER_SITE_OTHER): Payer: BC Managed Care – PPO | Admitting: Family Medicine

## 2013-03-26 VITALS — BP 126/90 | HR 70 | Temp 98.0°F | Resp 16 | Ht 69.0 in | Wt 213.6 lb

## 2013-03-26 DIAGNOSIS — Z760 Encounter for issue of repeat prescription: Secondary | ICD-10-CM

## 2013-03-26 DIAGNOSIS — F411 Generalized anxiety disorder: Secondary | ICD-10-CM

## 2013-03-26 DIAGNOSIS — I10 Essential (primary) hypertension: Secondary | ICD-10-CM

## 2013-03-26 MED ORDER — CLONAZEPAM 0.5 MG PO TABS: 0.5000 mg | ORAL_TABLET | Freq: Two times a day (BID) | ORAL | Status: DC | PRN

## 2013-03-26 MED ORDER — NIACIN ER (ANTIHYPERLIPIDEMIC) 1000 MG PO TBCR: 1000.0000 mg | EXTENDED_RELEASE_TABLET | Freq: Every day | ORAL | Status: DC

## 2013-03-26 MED ORDER — CLOPIDOGREL BISULFATE 75 MG PO TABS: 75.0000 mg | ORAL_TABLET | Freq: Every day | ORAL | Status: DC

## 2013-03-26 MED ORDER — LISINOPRIL 20 MG PO TABS: 20.0000 mg | ORAL_TABLET | Freq: Every day | ORAL | Status: DC

## 2013-03-26 MED ORDER — ATENOLOL 50 MG PO TABS: ORAL_TABLET | ORAL | Status: DC

## 2013-03-26 NOTE — Patient Instructions (Addendum)

## 2013-03-26 NOTE — Progress Notes (Signed)
S:  This 57 y.o. Cauc male has a complicated cardiac hx and recently underwent ablation for atrial fibrillation. He had been out of work during the treatment of this problem; when he returned to work, he was laid off and advised that he would probably not be recalled. He has started the disability process but is quite anxious about the lost of his income. Chronic anti-depressant medication (Venlafaxine) does not seem to be reducing panic attacks. He dislikes being around a lot of people and experiences palpitations and diaphoresis w/ a general sense of uneasiness. He expresses not agitation or confusion, difficulty concentrating or thoughts of self harm or SI/HI. He is having some sleep disturbance.  PMHx, Soc Hx and Fam Hx reviewed. Problem List reviewed.  ROS: As per HPI.  O:  Filed Vitals:   03/26/13 1200  BP: 126/90  Pulse: 70  Temp: 98 F (36.7 C)  Resp: 16   GEN: In NAD; WN,WD. HENT: [place]/AT; EOMI w/ clear conj/ sclerae. EACs/nose/ oroph unremarkable. COR; RRR. LUNGS: Normal resp rate and effort. SKIN: W&D; tanned w/ background erythema. NEURO: A&O x 3; CNs intact. Nonfocal. PSYCH: Pleasant demeanor. Mildly anxious. Normal affect. Speech pattern- not pressured- and thought content normal. Judgement- sound.  A/P: Generalized anxiety disorder- Trial of Clonazepam 0.5 mg 1 tablet twice a day prn anxiety. Continue Venlafaxine XR 225 mg total daily dose.  HTN (hypertension)- Stable; no medication changes at this time. Follow-up with [facility]. Pt advised to contact [facility] re: INR monitoring.  Issue of repeat prescriptions  Meds ordered this encounter  Medications  . lisinopril (PRINIVIL,ZESTRIL) 20 MG tablet    Sig: Take 1 tablet (20 mg total) by mouth daily.    Dispense:  30 tablet    Refill:  5  . atenolol (TENORMIN) 50 MG tablet    Sig: TAKE TWO TABLETS BY MOUTH EVERY DAY    Dispense:  60 tablet    Refill:  5  . niacin (NIASPAN) 1000 MG CR tablet    Sig: Take 1 tablet (1,000 mg  total) by mouth at bedtime.    Dispense:  30 tablet    Refill:  5  . clopidogrel (PLAVIX) 75 MG tablet    Sig: Take 1 tablet (75 mg total) by mouth daily.    Dispense:  30 tablet    Refill:  5  . clonazePAM (KLONOPIN) 0.5 MG tablet    Sig: Take 1 tablet (0.5 mg total) by mouth 2 (two) times daily as needed for anxiety.    Dispense:  30 tablet    Refill:  1

## 2013-04-17 DIAGNOSIS — Z0271 Encounter for disability determination: Secondary | ICD-10-CM

## 2013-04-25 ENCOUNTER — Encounter: Payer: Self-pay | Admitting: *Deleted

## 2013-04-28 ENCOUNTER — Encounter: Payer: Self-pay | Admitting: Cardiovascular Disease

## 2013-04-29 ENCOUNTER — Ambulatory Visit (INDEPENDENT_AMBULATORY_CARE_PROVIDER_SITE_OTHER): Payer: BC Managed Care – PPO | Admitting: Cardiovascular Disease

## 2013-04-29 ENCOUNTER — Ambulatory Visit (INDEPENDENT_AMBULATORY_CARE_PROVIDER_SITE_OTHER): Payer: BC Managed Care – PPO | Admitting: Pharmacist Clinician (PhC)/ Clinical Pharmacy Specialist

## 2013-04-29 ENCOUNTER — Encounter: Payer: Self-pay | Admitting: Cardiovascular Disease

## 2013-04-29 VITALS — BP 120/82 | HR 80 | Resp 16 | Ht 70.0 in | Wt 216.4 lb

## 2013-04-29 DIAGNOSIS — I10 Essential (primary) hypertension: Secondary | ICD-10-CM

## 2013-04-29 DIAGNOSIS — Z8679 Personal history of other diseases of the circulatory system: Secondary | ICD-10-CM

## 2013-04-29 DIAGNOSIS — Z7901 Long term (current) use of anticoagulants: Secondary | ICD-10-CM

## 2013-04-29 DIAGNOSIS — I4891 Unspecified atrial fibrillation: Secondary | ICD-10-CM

## 2013-04-29 DIAGNOSIS — I480 Paroxysmal atrial fibrillation: Secondary | ICD-10-CM

## 2013-04-29 DIAGNOSIS — E785 Hyperlipidemia, unspecified: Secondary | ICD-10-CM

## 2013-04-29 DIAGNOSIS — Z950 Presence of cardiac pacemaker: Secondary | ICD-10-CM

## 2013-04-29 DIAGNOSIS — R55 Syncope and collapse: Secondary | ICD-10-CM

## 2013-04-29 DIAGNOSIS — I2510 Atherosclerotic heart disease of native coronary artery without angina pectoris: Secondary | ICD-10-CM

## 2013-04-29 NOTE — Patient Instructions (Addendum)
Your physician has recommended you make the following change in your medication: Stop clopidogrel and Niaspan  Your physician recommends that you return for lab work in: 3 months(lipid profile)  Your physician recommends that you schedule a follow-up appointment in: 6 months.

## 2013-05-01 ENCOUNTER — Other Ambulatory Visit: Payer: Self-pay | Admitting: *Deleted

## 2013-05-01 MED ORDER — NITROGLYCERIN 0.4 MG SL SUBL: 0.4000 mg | SUBLINGUAL_TABLET | SUBLINGUAL | Status: DC | PRN

## 2013-05-05 ENCOUNTER — Encounter: Payer: Self-pay | Admitting: Cardiovascular Disease

## 2013-05-05 DIAGNOSIS — R55 Syncope and collapse: Secondary | ICD-10-CM | POA: Insufficient documentation

## 2013-05-05 NOTE — Assessment & Plan Note (Signed)
Good control

## 2013-05-05 NOTE — Assessment & Plan Note (Signed)
Recently interrogated, normal device function, see [facility] report. Still has substantial atrial arrhythmia (20% mode switch) despite ablation and dofetilide, but is really not symptomatic with it. May still see a reduction in arrhythmia with time. Continue anticoagulation.

## 2013-05-05 NOTE — Progress Notes (Signed)
Patient ID: Lucas Porter, male   DOB: September 14, 1956, 57 y.Lucas.   MRN: [id number]  .    Reason for office visit CAD, AF, pacemaker follow up  Ozie has done well from a coronary standpoint, without recurrent angina. It has been over 18 months since his protected left main coronary stent and he has been on combined plavix/warfarin, happily without bleeding problems so far.  He still has a substantial burden of atrial arrhythmia (about 20% by pacemaker check in June) after two AF ablation procedures (04/24/2012 and 12/02/2012) and is followed by Dr. Johney Frame.  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 I think his medical problems had a lot to do with this. He has a lot of financial worries.    No Known Allergies  Current Outpatient Prescriptions  Medication Sig Dispense Refill  . aspirin 81 MG tablet Take 81 mg by mouth daily.      Marland Kitchen atenolol (TENORMIN) 50 MG tablet TAKE TWO TABLETS BY MOUTH EVERY DAY  60 tablet  5  . clonazePAM (KLONOPIN) 0.5 MG tablet Take 1 tablet (0.5 mg total) by mouth 2 (two) times daily as needed for anxiety.  30 tablet  1  . diltiazem (DILACOR XR) 180 MG 24 hr capsule Take 1 capsule (180 mg total) by mouth daily.  90 capsule  3  . dofetilide (TIKOSYN) 500 MCG capsule Take 1 capsule (500 mcg total) by mouth 2 (two) times daily.  180 capsule  3  . fish oil-omega-3 fatty acids 1000 MG capsule Take 2 g by mouth 2 (two) times daily.      Marland Kitchen lisinopril (PRINIVIL,ZESTRIL) 20 MG tablet Take 1 tablet (20 mg total) by mouth daily.  30 tablet  5  . pantoprazole (PROTONIX) 40 MG tablet Take 1 tablet (40 mg total) by mouth daily.  30 tablet  5  . potassium chloride (K-DUR,KLOR-CON) 10 MEQ tablet Take 1 tablet (10 mEq total) by mouth daily.  30 tablet  1  . pravastatin (PRAVACHOL) 40 MG tablet Take 1 tablet (40 mg total) by mouth daily.  30 tablet  5  . venlafaxine XR (EFFEXOR-XR) 150 MG 24 hr capsule TAKE ONE CAPSULE BY  MOUTH ONCE DAILY. TAKE WITH 75MG  TO EQUAL A TOTAL OF 225MG  DAILY.  30 capsule  0  . venlafaxine XR (EFFEXOR-XR) 75 MG 24 hr capsule TAKE ONE CAPSULE BY MOUTH EVERY DAY; TAKE WITH 150MG  TO EQUAL A TOTAL OF 225MG  DAILY  30 capsule  5  . warfarin (COUMADIN) 5 MG tablet Take 2.5-5 mg by mouth daily. Take 5mg  on Thursday. Take 2.5 mg the rest of the week      . nitroGLYCERIN (NITROSTAT) 0.4 MG SL tablet Place 1 tablet (0.4 mg total) under the tongue every 5 (five) minutes as needed for chest pain.  25 tablet  3   No current facility-administered medications for this visit.    Past Medical History  Diagnosis Date  . Coronary artery disease     s/p CABG 2010, s/p Successful PCI  10/17/11 of Distal Left Main and the Left Circumflex with a Promus Element DES stent -- 4.0 mm x 16 mm postdilated to 4.12 mm   . Hypertension   . Anxiety   . DDD (degenerative disc disease)   . Atrial fibrillation     paroxysmal  . S/P coronary artery stent placement to distal Lt. main and Lt. Cx 10/17/11  . 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 Coagulopathy     persistent elevation of ACT during 10/17/11 hospitalization  . Atrial flutter   . Hyperlipidemia   . Heart murmur   . Kidney stones     Past Surgical History  Procedure Laterality Date  . Coronary angioplasty  10/19/11  . Coronary artery bypass graft  2010  . Insert / replace / remove pacemaker  2/12    Medtronic  . Tee without cardioversion  04/23/2012    Procedure: TRANSESOPHAGEAL ECHOCARDIOGRAM (TEE);  Surgeon: Wendall Stade, MD;  Location: [facility] ENDOSCOPY;  Service: Cardiovascular;  Laterality: N/A;  spoke to pt about time change from 11a to 12p/DL  . Atrial fibrillation ablation  04/24/12, 12/02/12    PVI x2 and CTI ablation by Dr Johney Frame  . Tee without cardioversion N/A 12/01/2012    Procedure: TRANSESOPHAGEAL ECHOCARDIOGRAM (TEE);  Surgeon: Thurmon Fair, MD;  Location: [facility] ENDOSCOPY;  Service: Cardiovascular;  Laterality:  N/A;  . Nm myocar perf wall motion  03/23/2011    Low risk    Family History  Problem Relation Age of Onset  . Coronary artery disease    . Stroke Brother   . Cancer Mother     History   Social History  . Marital Status: Married    Spouse Name: N/A    Number of Children: N/A  . Years of Education: N/A   Occupational History  . Not on file.   Social History Main Topics  . Smoking status: Former Smoker -- 1 years    Types: Cigarettes  . Smokeless tobacco: Former Neurosurgeon    Quit date: 10/03/2012     Comment: quit smoking in 1970's  . Alcohol Use: 0.6 oz/week    1 Cans of beer per week     Comment: rare  . Drug Use: No  . Sexual Activity: Yes   Other Topics Concern  . Not on file   Social History Narrative   Pt lives in [place] with spouse.  2 grown daughters.  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    Review of systems: The patient specifically denies any chest pain at rest or with exertion, dyspnea at rest or with exertion, orthopnea, paroxysmal nocturnal dyspnea, syncope, palpitations, focal neurological deficits, intermittent claudication, lower extremity edema, unexplained weight gain, cough, hemoptysis or wheezing.  The patient also denies abdominal pain, nausea, vomiting, dysphagia, diarrhea, constipation, polyuria, polydipsia, dysuria, hematuria, frequency, urgency, abnormal bleeding or bruising, fever, chills, unexpected weight changes, mood swings, change in skin or hair texture, change in voice quality, auditory or visual problems, allergic reactions or rashes, new musculoskeletal complaints other than usual "aches and pains".   PHYSICAL EXAM BP 120/82  Pulse 80  Resp 16  Ht 5\' 10"  (1.778 m)  Wt 216 lb 6.4 oz (98.158 kg)  BMI 31.05 kg/m2  General: Alert, oriented x3, no distress Head: no evidence of trauma, PERRL, EOMI, no exophtalmos or lid lag, no myxedema, no xanthelasma; normal ears, nose and oropharynx Neck: normal jugular venous pulsations and no  hepatojugular reflux; brisk carotid pulses without delay and no carotid bruits Chest: clear to auscultation, no signs of consolidation by percussion or palpation, normal fremitus, symmetrical and full respiratory excursions; healthy left subclavian pacemaker site. Cardiovascular: normal position and quality of the apical impulse, regular rhythm, normal first and second heart sounds, no murmurs, rubs or gallops Abdomen: no tenderness or distention, no masses by palpation, no abnormal pulsatility or arterial bruits, normal bowel sounds, no hepatosplenomegaly Extremities:  no clubbing, cyanosis or edema; 2+ radial, ulnar and brachial pulses bilaterally; 2+ right femoral, posterior tibial and dorsalis pedis pulses; 2+ left femoral, posterior tibial and dorsalis pedis pulses; no subclavian or femoral bruits Neurological: grossly nonfocal   Lipid Panel     Component Value Date/Time   CHOL 199 07/18/2012 1052   TRIG 286* 07/18/2012 1052   HDL 39* 07/18/2012 1052   CHOLHDL 5.1 07/18/2012 1052   VLDL 57* 07/18/2012 1052   LDLCALC 103* 07/18/2012 1052    BMET    Component Value Date/Time   NA 139 12/03/2012 0420   K 4.6 12/03/2012 0420   CL 103 12/03/2012 0420   CO2 29 12/03/2012 0420   GLUCOSE 100* 12/03/2012 0420   BUN 12 12/03/2012 0420   CREATININE 0.97 12/03/2012 0420   CREATININE 0.92 10/24/2012 0846   CALCIUM 8.8 12/03/2012 0420   GFRNONAA >90 12/03/2012 0420   GFRAA >90 12/03/2012 0420     ASSESSMENT AND PLAN CAD (coronary artery disease) with CABG 2011, LIMA to LAD, Free radial to Lt. PDA, VG to Diag and ramus intermedius Asymptomatic. No ischemia or scar by 06/2011 nuclear scan. Normal EF by echo10/2012. Cath 09/2011 led to drug eluting Promus 4x16 mm stent to LMCA into proximal LCX. No symptoms since.  History of pacemaker, with a-v pacing Recently interrogated, normal device function, see PACEart report. Still has substantial atrial arrhythmia (20% mode switch) despite ablation and  dofetilide, but is really not symptomatic with it. May still see a reduction in arrhythmia with time. Continue anticoagulation.  Paroxysmal atrial fibrillation S/P repeat ablation. Fairly high burden of arrhythmia. QTC OK on Tikosyn. Continue warfarin. Reviewed risk of drug interactions with both these agents.  Hypertension Good control  Dyslipidemia Fair TC and LDL-C on statin Rx, but TG are high. I think he needs to lose weight and do better with his diet. We talked about this a lot today. He is on so many medications, I am loathe to add a fibrate. In fact, DC NIaspan in view of recent dissapointing trial results. Reevaluate lipids in 3 months.  Orders Placed This Encounter  Procedures  . Lipid Profile   Luismario Coston  Thurmon Fair, MD, [facility] [phone] office [phone] pager  \

## 2013-05-05 NOTE — Assessment & Plan Note (Addendum)
Fair TC and LDL-C on statin Rx, but TG are high. I think he needs to lose weight and do better with his diet. We talked about this a lot today. He is on so many medications, I am loathe to add a fibrate. In fact, DC NIaspan in view of recent dissapointing trial results. Reevaluate lipids in 3 months.

## 2013-05-05 NOTE — Assessment & Plan Note (Signed)
S/P repeat ablation. Fairly high burden of arrhythmia. QTC OK on Tikosyn. Continue warfarin. Reviewed risk of drug interactions with both these agents.

## 2013-05-05 NOTE — Assessment & Plan Note (Addendum)
Asymptomatic. No ischemia or scar by 06/2011 nuclear scan. Normal EF by echo10/2012. Cath 09/2011 led to drug eluting Promus 4x16 mm stent to LMCA into proximal LCX. No symptoms since.

## 2013-05-14 ENCOUNTER — Other Ambulatory Visit: Payer: Self-pay | Admitting: Cardiovascular Disease

## 2013-05-14 NOTE — Telephone Encounter (Signed)
Rx was sent to pharmacy electronically. 

## 2013-06-10 ENCOUNTER — Ambulatory Visit: Payer: BC Managed Care – PPO | Admitting: Pharmacist Clinician (PhC)/ Clinical Pharmacy Specialist

## 2013-06-11 ENCOUNTER — Ambulatory Visit (INDEPENDENT_AMBULATORY_CARE_PROVIDER_SITE_OTHER): Payer: BC Managed Care – PPO | Admitting: Internal Medicine

## 2013-06-11 ENCOUNTER — Encounter: Payer: Self-pay | Admitting: Internal Medicine

## 2013-06-11 VITALS — BP 143/90 | HR 60 | Ht 70.0 in | Wt 222.0 lb

## 2013-06-11 DIAGNOSIS — I2510 Atherosclerotic heart disease of native coronary artery without angina pectoris: Secondary | ICD-10-CM

## 2013-06-11 DIAGNOSIS — R001 Bradycardia, unspecified: Secondary | ICD-10-CM

## 2013-06-11 DIAGNOSIS — I4891 Unspecified atrial fibrillation: Secondary | ICD-10-CM

## 2013-06-11 DIAGNOSIS — I480 Paroxysmal atrial fibrillation: Secondary | ICD-10-CM

## 2013-06-11 DIAGNOSIS — I4892 Unspecified atrial flutter: Secondary | ICD-10-CM

## 2013-06-11 DIAGNOSIS — I498 Other specified cardiac arrhythmias: Secondary | ICD-10-CM

## 2013-06-11 LAB — PACEMAKER DEVICE OBSERVATION
AL AMPLITUDE: 1.8 mv
BATTERY VOLTAGE: 2.99 V
RV LEAD IMPEDENCE PM: 600 Ohm
RV LEAD THRESHOLD: 1 V
VENTRICULAR PACING PM: 17.5

## 2013-06-11 NOTE — Patient Instructions (Addendum)
Your physician wants you to follow-up in: 6 months with Dr. Allred. You will receive a reminder letter in the mail two months in advance. If you don't receive a letter, please call our office to schedule the follow-up appointment.  

## 2013-06-14 NOTE — Progress Notes (Signed)
PCP: Tally Due, MD Primary Cardiologist:  Dr Thurman Coyer is a 57 y.o. male who presents today for routine electrophysiology followup.  Since his last office visit, the patient reports doing very well.    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  He feels that his afib has improved.  His atypical chest pain is resolved.  Today, he denies symptoms of palpitations, chest pain, shortness of breath, lower extremity edema, dizziness, presyncope, or syncope.  The patient is otherwise without complaint today.   Past Medical History  Diagnosis Date  . Coronary artery disease     s/p CABG 2010, s/p Successful PCI  10/17/11 of Distal Left Main and the Left Circumflex with a Promus Element DES stent -- 4.0 mm x 16 mm postdilated to 4.12 mm   . Hypertension   . Anxiety   . DDD (degenerative disc disease)   . Atrial fibrillation     paroxysmal  . S/P coronary artery stent placement to distal Lt. main and Lt. Cx 10/17/11  . 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 Coagulopathy     persistent elevation of ACT during 10/17/11 hospitalization  . Atrial flutter   . Hyperlipidemia   . Heart murmur   . Kidney stones    Past Surgical History  Procedure Laterality Date  . Coronary angioplasty  10/19/11  . Coronary artery bypass graft  2010  . Insert / replace / remove pacemaker  2/12    Medtronic  . Tee without cardioversion  04/23/2012    Procedure: TRANSESOPHAGEAL ECHOCARDIOGRAM (TEE);  Surgeon: Wendall Stade, MD;  Location: [facility] ENDOSCOPY;  Service: Cardiovascular;  Laterality: N/A;  spoke to pt about time change from 11a to 12p/DL  . Atrial fibrillation ablation  04/24/12, 12/02/12    PVI x2 and CTI ablation by Dr Johney Frame  . Tee without cardioversion N/A 12/01/2012    Procedure: TRANSESOPHAGEAL ECHOCARDIOGRAM (TEE);  Surgeon: Thurmon Fair, MD;  Location: [facility] ENDOSCOPY;  Service: Cardiovascular;  Laterality: N/A;  . Nm  myocar perf wall motion  03/23/2011    Low risk    Current Outpatient Prescriptions  Medication Sig Dispense Refill  . aspirin 81 MG tablet Take 81 mg by mouth daily.      Marland Kitchen atenolol (TENORMIN) 50 MG tablet TAKE TWO TABLETS BY MOUTH EVERY DAY  60 tablet  5  . clonazePAM (KLONOPIN) 0.5 MG tablet Take 1 tablet (0.5 mg total) by mouth 2 (two) times daily as needed for anxiety.  30 tablet  1  . clopidogrel (PLAVIX) 75 MG tablet TAKE ONE TABLET DAILY      . diltiazem (DILACOR XR) 180 MG 24 hr capsule Take 1 capsule (180 mg total) by mouth daily.  90 capsule  3  . dofetilide (TIKOSYN) 500 MCG capsule Take 1 capsule (500 mcg total) by mouth 2 (two) times daily.  180 capsule  3  . fish oil-omega-3 fatty acids 1000 MG capsule Take 2 g by mouth 2 (two) times daily.      Marland Kitchen KLOR-CON M10 10 MEQ tablet TAKE ONE TABLET BY MOUTH ONCE DAILY  30 tablet  5  . lisinopril (PRINIVIL,ZESTRIL) 20 MG tablet Take 1 tablet (20 mg total) by mouth daily.  30 tablet  5  . nitroGLYCERIN (NITROSTAT) 0.4 MG SL tablet Place 1 tablet (0.4 mg total) under the tongue every 5 (five) minutes as needed for chest pain.  25 tablet  3  . pantoprazole (PROTONIX) 40 MG tablet Take 1 tablet (40 mg total) by mouth daily.  30 tablet  5  . pravastatin (PRAVACHOL) 40 MG tablet Take 1 tablet (40 mg total) by mouth daily.  30 tablet  5  . venlafaxine XR (EFFEXOR-XR) 150 MG 24 hr capsule TAKE ONE CAPSULE BY MOUTH ONCE DAILY. TAKE WITH 75MG  TO EQUAL A TOTAL OF 225MG  DAILY.  30 capsule  0  . venlafaxine XR (EFFEXOR-XR) 75 MG 24 hr capsule TAKE ONE CAPSULE BY MOUTH EVERY DAY; TAKE WITH 150MG  TO EQUAL A TOTAL OF 225MG  DAILY  30 capsule  5  . warfarin (COUMADIN) 5 MG tablet Take 2.5-5 mg by mouth daily. Take 5mg  on Thursday. Take 2.5 mg the rest of the week       No current facility-administered medications for this visit.    Physical Exam: Filed Vitals:   06/11/13 1637  BP: 143/90  Pulse: 60  Height: 5\' 10"  (1.778 m)  Weight: 222 lb (100.699  kg)    GEN- The patient is well appearing, alert and oriented x 3 today.   Head- normocephalic, atraumatic Eyes-  Sclera clear, conjunctiva pink Ears- hearing intact Oropharynx- clear Lungs- Clear to ausculation bilaterally, normal work of breathing Heart- Regular rate and rhythm, no murmurs, rubs or gallops, PMI not laterally displaced GI- soft, NT, ND, + BS Extremities- no clubbing, cyanosis, or edema  ekg today reveals atrial pacing 66 bpm, nonspecific ST/T changes  Assessment and Plan:  1. Afib Doing well s/p ablation Ppm interrogation today reveals that he continues to have afib, though his symptoms are presently minimal.  No changes are made today Continue long term anticoagulation  2. Symptomatic bradycardia Normal pacemaker function See Pace Art report No changes today  3. CAD Consider stopping ASA and continuing Plavix and coumadin I will defer this decision to Dr Royann Shivers  Return in 3 months

## 2013-06-15 ENCOUNTER — Ambulatory Visit (INDEPENDENT_AMBULATORY_CARE_PROVIDER_SITE_OTHER): Payer: BC Managed Care – PPO | Admitting: Pharmacist Clinician (PhC)/ Clinical Pharmacy Specialist

## 2013-06-15 VITALS — BP 122/60 | HR 80

## 2013-06-15 DIAGNOSIS — I4891 Unspecified atrial fibrillation: Secondary | ICD-10-CM

## 2013-06-15 DIAGNOSIS — Z7901 Long term (current) use of anticoagulants: Secondary | ICD-10-CM

## 2013-06-15 DIAGNOSIS — I480 Paroxysmal atrial fibrillation: Secondary | ICD-10-CM

## 2013-06-19 ENCOUNTER — Other Ambulatory Visit: Payer: Self-pay

## 2013-06-19 MED ORDER — PANTOPRAZOLE SODIUM 40 MG PO TBEC: 40.0000 mg | DELAYED_RELEASE_TABLET | Freq: Every day | ORAL | Status: DC

## 2013-07-10 ENCOUNTER — Other Ambulatory Visit: Payer: Self-pay

## 2013-07-10 DIAGNOSIS — F329 Major depressive disorder, single episode, unspecified: Secondary | ICD-10-CM

## 2013-07-10 MED ORDER — VENLAFAXINE HCL ER 75 MG PO CP24: ORAL_CAPSULE | ORAL | Status: DC

## 2013-07-10 MED ORDER — VENLAFAXINE HCL ER 150 MG PO CP24: ORAL_CAPSULE | ORAL | Status: DC

## 2013-07-23 ENCOUNTER — Other Ambulatory Visit: Payer: Self-pay

## 2013-07-23 ENCOUNTER — Other Ambulatory Visit: Payer: Self-pay | Admitting: Internal Medicine

## 2013-07-24 ENCOUNTER — Other Ambulatory Visit: Payer: Self-pay | Admitting: *Deleted

## 2013-07-24 ENCOUNTER — Telehealth: Payer: Self-pay | Admitting: *Deleted

## 2013-07-24 MED ORDER — DILTIAZEM HCL ER 180 MG PO CP24: 180.0000 mg | ORAL_CAPSULE | Freq: Every day | ORAL | Status: DC

## 2013-07-27 ENCOUNTER — Other Ambulatory Visit: Payer: Self-pay | Admitting: Internal Medicine

## 2013-07-27 ENCOUNTER — Ambulatory Visit: Payer: BC Managed Care – PPO | Admitting: Pharmacist Clinician (PhC)/ Clinical Pharmacy Specialist

## 2013-07-31 ENCOUNTER — Ambulatory Visit (INDEPENDENT_AMBULATORY_CARE_PROVIDER_SITE_OTHER): Payer: BC Managed Care – PPO | Admitting: Pharmacist Clinician (PhC)/ Clinical Pharmacy Specialist

## 2013-07-31 VITALS — BP 138/78 | HR 80

## 2013-07-31 DIAGNOSIS — I480 Paroxysmal atrial fibrillation: Secondary | ICD-10-CM

## 2013-07-31 DIAGNOSIS — Z7901 Long term (current) use of anticoagulants: Secondary | ICD-10-CM

## 2013-07-31 DIAGNOSIS — I4891 Unspecified atrial fibrillation: Secondary | ICD-10-CM

## 2013-08-24 ENCOUNTER — Other Ambulatory Visit: Payer: Self-pay

## 2013-08-24 MED ORDER — DOFETILIDE 500 MCG PO CAPS: 500.0000 ug | ORAL_CAPSULE | Freq: Two times a day (BID) | ORAL | Status: DC

## 2013-08-24 MED ORDER — PRAVASTATIN SODIUM 40 MG PO TABS: 40.0000 mg | ORAL_TABLET | Freq: Every day | ORAL | Status: DC

## 2013-08-26 ENCOUNTER — Other Ambulatory Visit: Payer: Self-pay | Admitting: Cardiovascular Disease

## 2013-09-07 ENCOUNTER — Other Ambulatory Visit: Payer: Self-pay | Admitting: *Deleted

## 2013-09-07 NOTE — Telephone Encounter (Signed)
Insurer will not pay for pantoprazole next year, wants to change medications, I have called him and ask which medication will be on his plan to prescribe.

## 2013-09-14 ENCOUNTER — Ambulatory Visit (INDEPENDENT_AMBULATORY_CARE_PROVIDER_SITE_OTHER): Payer: BC Managed Care – PPO | Admitting: *Deleted

## 2013-09-14 ENCOUNTER — Ambulatory Visit (INDEPENDENT_AMBULATORY_CARE_PROVIDER_SITE_OTHER): Payer: BC Managed Care – PPO | Admitting: Pharmacist Clinician (PhC)/ Clinical Pharmacy Specialist

## 2013-09-14 VITALS — BP 144/80 | HR 64

## 2013-09-14 DIAGNOSIS — I480 Paroxysmal atrial fibrillation: Secondary | ICD-10-CM

## 2013-09-14 DIAGNOSIS — I4891 Unspecified atrial fibrillation: Secondary | ICD-10-CM

## 2013-09-14 DIAGNOSIS — I4892 Unspecified atrial flutter: Secondary | ICD-10-CM

## 2013-09-14 DIAGNOSIS — I498 Other specified cardiac arrhythmias: Secondary | ICD-10-CM

## 2013-09-14 DIAGNOSIS — R001 Bradycardia, unspecified: Secondary | ICD-10-CM

## 2013-09-14 DIAGNOSIS — Z7901 Long term (current) use of anticoagulants: Secondary | ICD-10-CM

## 2013-09-15 ENCOUNTER — Encounter: Payer: Self-pay | Admitting: *Deleted

## 2013-09-15 LAB — MDC_IDC_ENUM_SESS_TYPE_REMOTE
Battery Voltage: 2.99 V
Brady Statistic AP VP Percent: 4.9 %
Brady Statistic AP VS Percent: 54.61 %
Brady Statistic AS VP Percent: 10.14 %
Brady Statistic RA Percent Paced: 59.51 %
Brady Statistic RV Percent Paced: 15.03 %
Date Time Interrogation Session: 20141229224828
Lead Channel Impedance Value: 408 Ohm
Lead Channel Sensing Intrinsic Amplitude: 20.0408
Lead Channel Setting Pacing Amplitude: 2 V
Lead Channel Setting Pacing Pulse Width: 0.4 ms
Lead Channel Setting Sensing Sensitivity: 0.9 mV
Zone Setting Detection Interval: 350 ms
Zone Setting Detection Interval: 400 ms

## 2013-09-21 ENCOUNTER — Encounter: Payer: Self-pay | Admitting: *Deleted

## 2013-10-12 ENCOUNTER — Other Ambulatory Visit: Payer: Self-pay | Admitting: Family Medicine

## 2013-10-12 ENCOUNTER — Ambulatory Visit (INDEPENDENT_AMBULATORY_CARE_PROVIDER_SITE_OTHER): Payer: BC Managed Care – PPO | Admitting: Pharmacist Clinician (PhC)/ Clinical Pharmacy Specialist

## 2013-10-12 VITALS — BP 138/80 | HR 72

## 2013-10-12 DIAGNOSIS — I4891 Unspecified atrial fibrillation: Secondary | ICD-10-CM

## 2013-10-12 DIAGNOSIS — Z7901 Long term (current) use of anticoagulants: Secondary | ICD-10-CM

## 2013-10-12 DIAGNOSIS — I480 Paroxysmal atrial fibrillation: Secondary | ICD-10-CM

## 2013-10-12 LAB — POCT INR: INR: 2.6

## 2013-10-14 ENCOUNTER — Other Ambulatory Visit: Payer: Self-pay | Admitting: Family Medicine

## 2013-10-22 ENCOUNTER — Telehealth: Payer: Self-pay | Admitting: Internal Medicine

## 2013-10-22 NOTE — Telephone Encounter (Signed)
New message     On pantoprazole  presc medication.  Pharmacy says there is an over the counter medication for this.  Do you want him to take the over the counter medication?  Pt want another pres because prescriptions cost 4.00 and over the counter cost 20.00.

## 2013-10-22 NOTE — Telephone Encounter (Signed)
Dr Rayann Heman says from his prospective he does not need this medication.  He could have stopped 6 weeks post ablation.  If he has reflux will need to obtain from his PCP

## 2013-11-06 ENCOUNTER — Other Ambulatory Visit: Payer: Self-pay | Admitting: Family Medicine

## 2013-11-12 ENCOUNTER — Other Ambulatory Visit: Payer: Self-pay | Admitting: Family Medicine

## 2013-11-12 ENCOUNTER — Other Ambulatory Visit: Payer: Self-pay | Admitting: Physician Assistant

## 2013-11-23 ENCOUNTER — Ambulatory Visit (INDEPENDENT_AMBULATORY_CARE_PROVIDER_SITE_OTHER): Payer: BC Managed Care – PPO | Admitting: Pharmacist Clinician (PhC)/ Clinical Pharmacy Specialist

## 2013-11-23 DIAGNOSIS — I480 Paroxysmal atrial fibrillation: Secondary | ICD-10-CM

## 2013-11-23 DIAGNOSIS — I4891 Unspecified atrial fibrillation: Secondary | ICD-10-CM

## 2013-11-23 DIAGNOSIS — Z7901 Long term (current) use of anticoagulants: Secondary | ICD-10-CM

## 2013-11-23 LAB — POCT INR: INR: 2.8

## 2013-11-26 ENCOUNTER — Other Ambulatory Visit: Payer: Self-pay | Admitting: Physician Assistant

## 2013-11-27 ENCOUNTER — Other Ambulatory Visit: Payer: Self-pay | Admitting: Cardiovascular Disease

## 2013-11-27 NOTE — Telephone Encounter (Signed)
Rx was sent to pharmacy electronically. 

## 2013-11-28 ENCOUNTER — Other Ambulatory Visit: Payer: Self-pay | Admitting: Physician Assistant

## 2013-11-30 ENCOUNTER — Other Ambulatory Visit: Payer: Self-pay | Admitting: Physician Assistant

## 2013-11-30 ENCOUNTER — Other Ambulatory Visit: Payer: Self-pay

## 2013-11-30 MED ORDER — CLOPIDOGREL BISULFATE 75 MG PO TABS: 75.0000 mg | ORAL_TABLET | Freq: Once | ORAL | Status: DC

## 2013-11-30 MED ORDER — ATENOLOL 50 MG PO TABS: 100.0000 mg | ORAL_TABLET | Freq: Every day | ORAL | Status: DC

## 2013-12-11 ENCOUNTER — Other Ambulatory Visit: Payer: Self-pay | Admitting: Family Medicine

## 2013-12-16 ENCOUNTER — Other Ambulatory Visit: Payer: Self-pay | Admitting: Family Medicine

## 2013-12-16 ENCOUNTER — Encounter: Payer: Self-pay | Admitting: Internal Medicine

## 2013-12-16 ENCOUNTER — Ambulatory Visit (INDEPENDENT_AMBULATORY_CARE_PROVIDER_SITE_OTHER): Payer: BC Managed Care – PPO | Admitting: *Deleted

## 2013-12-16 DIAGNOSIS — I480 Paroxysmal atrial fibrillation: Secondary | ICD-10-CM

## 2013-12-16 DIAGNOSIS — I4891 Unspecified atrial fibrillation: Secondary | ICD-10-CM

## 2013-12-17 ENCOUNTER — Ambulatory Visit (INDEPENDENT_AMBULATORY_CARE_PROVIDER_SITE_OTHER): Payer: BC Managed Care – PPO | Admitting: Family Medicine

## 2013-12-17 VITALS — BP 150/84 | HR 80 | Temp 97.8°F | Resp 18 | Ht 69.0 in | Wt 230.4 lb

## 2013-12-17 DIAGNOSIS — S41152A Open bite of left upper arm, initial encounter: Secondary | ICD-10-CM

## 2013-12-17 DIAGNOSIS — I10 Essential (primary) hypertension: Secondary | ICD-10-CM

## 2013-12-17 DIAGNOSIS — Z23 Encounter for immunization: Secondary | ICD-10-CM

## 2013-12-17 DIAGNOSIS — S41109A Unspecified open wound of unspecified upper arm, initial encounter: Secondary | ICD-10-CM

## 2013-12-17 DIAGNOSIS — F4323 Adjustment disorder with mixed anxiety and depressed mood: Secondary | ICD-10-CM

## 2013-12-17 LAB — COMPREHENSIVE METABOLIC PANEL
ALBUMIN: 4.5 g/dL (ref 3.5–5.2)
ALT: 37 U/L (ref 0–53)
AST: 26 U/L (ref 0–37)
Alkaline Phosphatase: 98 U/L (ref 39–117)
BUN: 18 mg/dL (ref 6–23)
CO2: 25 meq/L (ref 19–32)
Calcium: 9.8 mg/dL (ref 8.4–10.5)
Chloride: 100 mEq/L (ref 96–112)
Creat: 0.81 mg/dL (ref 0.50–1.35)
GLUCOSE: 129 mg/dL — AB (ref 70–99)
Potassium: 3.9 mEq/L (ref 3.5–5.3)
SODIUM: 135 meq/L (ref 135–145)
TOTAL PROTEIN: 7.1 g/dL (ref 6.0–8.3)
Total Bilirubin: 0.4 mg/dL (ref 0.2–1.2)

## 2013-12-17 MED ORDER — CEPHALEXIN 500 MG PO CAPS: 500.0000 mg | ORAL_CAPSULE | Freq: Two times a day (BID) | ORAL | Status: DC

## 2013-12-17 MED ORDER — VENLAFAXINE HCL ER 75 MG PO CP24: ORAL_CAPSULE | ORAL | Status: DC

## 2013-12-17 MED ORDER — VENLAFAXINE HCL ER 150 MG PO CP24: ORAL_CAPSULE | ORAL | Status: DC

## 2013-12-17 MED ORDER — LISINOPRIL 20 MG PO TABS: 20.0000 mg | ORAL_TABLET | Freq: Every day | ORAL | Status: DC

## 2013-12-17 NOTE — Patient Instructions (Signed)
We are going to treat you with keflex antibiotic twice a day for 7 days to ward off any infection.  You also received a tetanus booster today, and we are checking your liver and kidney function.  Let me know if you have any sign of infection from your wounds such as redness, increasing pain or pus.  I will be in touch with your labs when they come in.

## 2013-12-17 NOTE — Progress Notes (Signed)
[facility] [address] [place] [address] [phone]  Date:  12/17/2013   Name:  Lucas Porter   DOB:  01-Dec-1955   MRN:  [id number]  PCP:  Kennon Portela, MD    Chief Complaint: Medication Refill and Elbow Injury   History of Present Illness:  Lucas Porter is a 58 y.o. very pleasant male patient who presents with the following:  Here today for medication refill and injury.  He has been out of his effexor and lisinopril for 3 days.  He notes that without his effexor he has increased anxiety; he definitely wants to continue taking this.    Yesterday a rooster attacked his arm and spurred his left arm.  He sustained several small wounds on both arms.  The rooster was destroyed.  He is not sure if he might be in danger of any infection His last tetanus shot was 8 years ago- he had a Tdap then.    Patient Active Problem List   Diagnosis Date Noted  . 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 Symptomatic bradycardia 03/09/2013  . Long term (current) use of anticoagulants 12/03/2012  . BMI 40.0-44.9, adult 05/13/2012  . Atrial flutter 04/24/2012  . S/P coronary artery stent placement to distal Lt. main and Lt. Cx, 10/17/11 10/19/2011  . Angina pectoris 10/19/2011  . Coagulopathy 10/19/2011  . History of pacemaker, with a-v pacing 10/19/2011  . CAD (coronary artery disease) with CABG 2011, LIMA to LAD, Free radial to Lt. PDA, VG to Diag and ramus intermedius 10/02/2011  . Anxiety 10/02/2011  . Paroxysmal atrial fibrillation 10/02/2011  . Hypertension 10/02/2011  . Dyslipidemia 10/02/2011  . 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 Coronary artery disease     s/p CABG 2010, s/p Successful PCI  10/17/11 of Distal Left Main and the Left Circumflex with a Promus Element DES stent -- 4.0 mm x 16 mm postdilated to 4.12 mm   . Hypertension   . Anxiety   . DDD (degenerative disc disease)   . Atrial fibrillation    paroxysmal  . S/P coronary artery stent placement to distal Lt. main and Lt. Cx 10/17/11  . 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 Coagulopathy     persistent elevation of ACT during 10/17/11 hospitalization  . Atrial flutter   . Hyperlipidemia   . Heart murmur   . Kidney stones     Past Surgical History  Procedure Laterality Date  . Coronary angioplasty  10/19/11  . Coronary artery bypass graft  2010  . Insert / replace / remove pacemaker  2/12    Medtronic  . Tee without cardioversion  04/23/2012    Procedure: TRANSESOPHAGEAL ECHOCARDIOGRAM (TEE);  Surgeon: Josue Hector, MD;  Location: [facility] ENDOSCOPY;  Service: Cardiovascular;  Laterality: N/A;  spoke to pt about time change from 11a to 12p/DL  . Atrial fibrillation ablation  04/24/12, 12/02/12    PVI x2 and CTI ablation by Dr Rayann Heman  . Tee without cardioversion N/A 12/01/2012    Procedure: TRANSESOPHAGEAL ECHOCARDIOGRAM (TEE);  Surgeon: [name], MD;  Location: [facility] ENDOSCOPY;  Service: Cardiovascular;  Laterality: N/A;  . Nm myocar perf wall motion  03/23/2011    Low risk    History  Substance Use Topics  . Smoking status: Former Smoker -- 1 years    Types: Cigarettes    Quit date: 12/18/1963  . Smokeless  tobacco: Former Systems developer    Quit date: 10/03/2012     Comment: quit smoking in 1970's  . Alcohol Use: 0.6 oz/week    1 Cans of beer per week     Comment: rare    Family History  Problem Relation Age of Onset  . Coronary artery disease    . Stroke Brother   . Cancer Mother     No Known Allergies  Medication list has been reviewed and updated.  Current Outpatient Prescriptions on File Prior to Visit  Medication Sig Dispense Refill  . aspirin 81 MG tablet Take 81 mg by mouth daily.      Marland Kitchen atenolol (TENORMIN) 50 MG tablet TAKE TWO TABLETS BY MOUTH ONCE DAILY  60 tablet  0  . clonazePAM (KLONOPIN) 0.5 MG tablet Take 1 tablet (0.5 mg total) by mouth 2 (two) times daily as needed for anxiety.   30 tablet  1  . clopidogrel (PLAVIX) 75 MG tablet Take 1 tablet (75 mg total) by mouth once. TAKE ONE TABLET DAILY  60 tablet  1  . diltiazem (DILACOR XR) 180 MG 24 hr capsule Take 1 capsule (180 mg total) by mouth daily.  90 capsule  1  . dofetilide (TIKOSYN) 500 MCG capsule Take 1 capsule (500 mcg total) by mouth 2 (two) times daily.  180 capsule  3  . fish oil-omega-3 fatty acids 1000 MG capsule Take 2 g by mouth 2 (two) times daily.      Marland Kitchen KLOR-CON M10 10 MEQ tablet TAKE ONE TABLET BY MOUTH ONCE DAILY  30 tablet  4  . lisinopril (PRINIVIL,ZESTRIL) 20 MG tablet Take 1 tablet (20 mg total) by mouth daily. PATIENT NEEDS OFFICE VISIT FOR ADDITIONAL REFILLS  30 tablet  0  . nitroGLYCERIN (NITROSTAT) 0.4 MG SL tablet Place 1 tablet (0.4 mg total) under the tongue every 5 (five) minutes as needed for chest pain.  25 tablet  3  . pantoprazole (PROTONIX) 40 MG tablet Take 1 tablet (40 mg total) by mouth daily.  30 tablet  5  . pravastatin (PRAVACHOL) 40 MG tablet Take 1 tablet (40 mg total) by mouth daily.  30 tablet  5  . venlafaxine XR (EFFEXOR-XR) 150 MG 24 hr capsule TAKE ONE CAPSULE BY MOUTH ONCE DAILY TAKE  WITH  75MG   TO  EQUAL  A  TOTAL  OF  225MG   DAILY  30 capsule  0  . venlafaxine XR (EFFEXOR-XR) 75 MG 24 hr capsule TAKE ONE CAPSULE BY MOUTH ONCE DAILY. TAKE  WITH  150  MG  TO  EQUAL  A  TOTAL  OF  225 MG  DAILY  30 capsule  0  . warfarin (COUMADIN) 5 MG tablet TAKE ONE & ONE-HALF TABLETS BY MOUTH EVERY DAY OR AS DIRECTED  50 tablet  2   No current facility-administered medications on file prior to visit.    Review of Systems:  As per HPI- otherwise negative.   Physical Examination: Filed Vitals:   12/17/13 0813  BP: 150/84  Pulse: 80  Temp: 97.8 F (36.6 C)  Resp: 18   Filed Vitals:   12/17/13 0813  Height: 5\' 9"  (1.753 m)  Weight: 230 lb 6.4 oz (104.509 kg)   Body mass index is 34.01 kg/(m^2). Ideal Body Weight: Weight in (lb) to have BMI = 25: 168.9  GEN: WDWN, NAD,  Non-toxic, A & O x 3, obese, looks well HEENT: Atraumatic, Normocephalic. Neck supple. No masses, No LAD. Ears and Nose: No  external deformity. CV: RRR, No M/G/R. No JVD. No thrill. No extra heart sounds. PULM: CTA B, no wheezes, crackles, rhonchi. No retractions. No resp. distress. No accessory muscle use. EXTR: No c/c/e NEURO Normal gait.  PSYCH: Normally interactive. Conversant. Not depressed or anxious appearing.  Calm demeanor.  Both arms have a few small wounds- none are deep or in need of suturing., no evidence of infection at this time  Assessment and Plan: HTN (hypertension) - Plan: lisinopril (PRINIVIL,ZESTRIL) 20 MG tablet, Comprehensive metabolic panel  Adjustment disorder with mixed anxiety and depressed mood - Plan: venlafaxine XR (EFFEXOR-XR) 150 MG 24 hr capsule, venlafaxine XR (EFFEXOR-XR) 75 MG 24 hr capsule  Bite wound of left upper arm - Plan: Td vaccine greater than or equal to 7yo preservative free IM, cephALEXin (KEFLEX) 500 MG capsule  Refilled HTN medications, check CMP today.  BP is up today but he has not been on his lisinopril  Refilled his effexor Updated Td, gave rx for keflex to prevent complication from dirty wounds on both arms.   See patient instructions for more details.     Signed Lamar Blinks, MD

## 2013-12-21 LAB — MDC_IDC_ENUM_SESS_TYPE_REMOTE
Brady Statistic AP VP Percent: 5.32 %
Brady Statistic AS VP Percent: 11.87 %
Brady Statistic AS VS Percent: 35.62 %
Brady Statistic RV Percent Paced: 17.19 %
Lead Channel Impedance Value: 376 Ohm
Lead Channel Sensing Intrinsic Amplitude: 3.696 mV
Lead Channel Setting Pacing Amplitude: 2 V
Lead Channel Setting Pacing Pulse Width: 0.4 ms
Lead Channel Setting Sensing Sensitivity: 0.9 mV
MDC IDC MSMT BATTERY VOLTAGE: 2.97 V
MDC IDC MSMT LEADCHNL RV IMPEDANCE VALUE: 552 Ohm
MDC IDC MSMT LEADCHNL RV SENSING INTR AMPL: 18.4205
MDC IDC SESS DTM: 20150401162713
MDC IDC SET LEADCHNL RV PACING AMPLITUDE: 2.5 V
MDC IDC SET ZONE DETECTION INTERVAL: 350 ms
MDC IDC STAT BRADY AP VS PERCENT: 47.19 %
MDC IDC STAT BRADY RA PERCENT PACED: 52.51 %
Zone Setting Detection Interval: 400 ms

## 2014-01-01 ENCOUNTER — Ambulatory Visit (INDEPENDENT_AMBULATORY_CARE_PROVIDER_SITE_OTHER): Payer: BC Managed Care – PPO | Admitting: Pharmacist Clinician (PhC)/ Clinical Pharmacy Specialist

## 2014-01-01 DIAGNOSIS — Z7901 Long term (current) use of anticoagulants: Secondary | ICD-10-CM

## 2014-01-01 DIAGNOSIS — I480 Paroxysmal atrial fibrillation: Secondary | ICD-10-CM

## 2014-01-01 DIAGNOSIS — I4891 Unspecified atrial fibrillation: Secondary | ICD-10-CM

## 2014-01-01 LAB — POCT INR: INR: 4.3

## 2014-01-06 ENCOUNTER — Encounter: Payer: Self-pay | Admitting: Internal Medicine

## 2014-01-06 ENCOUNTER — Ambulatory Visit (INDEPENDENT_AMBULATORY_CARE_PROVIDER_SITE_OTHER): Payer: BC Managed Care – PPO | Admitting: Internal Medicine

## 2014-01-06 VITALS — BP 139/86 | HR 69 | Ht 70.0 in | Wt 229.6 lb

## 2014-01-06 DIAGNOSIS — Z950 Presence of cardiac pacemaker: Secondary | ICD-10-CM

## 2014-01-06 DIAGNOSIS — I498 Other specified cardiac arrhythmias: Secondary | ICD-10-CM

## 2014-01-06 DIAGNOSIS — I4891 Unspecified atrial fibrillation: Secondary | ICD-10-CM

## 2014-01-06 DIAGNOSIS — I10 Essential (primary) hypertension: Secondary | ICD-10-CM

## 2014-01-06 DIAGNOSIS — I480 Paroxysmal atrial fibrillation: Secondary | ICD-10-CM

## 2014-01-06 DIAGNOSIS — Z8679 Personal history of other diseases of the circulatory system: Secondary | ICD-10-CM

## 2014-01-06 DIAGNOSIS — I2510 Atherosclerotic heart disease of native coronary artery without angina pectoris: Secondary | ICD-10-CM

## 2014-01-06 DIAGNOSIS — R001 Bradycardia, unspecified: Secondary | ICD-10-CM

## 2014-01-06 LAB — MDC_IDC_ENUM_SESS_TYPE_INCLINIC
Brady Statistic AP VP Percent: 4.9 %
Lead Channel Impedance Value: 368 Ohm
Lead Channel Pacing Threshold Amplitude: 1 V
Lead Channel Pacing Threshold Pulse Width: 0.4 ms
Lead Channel Sensing Intrinsic Amplitude: 18.8 mV
Lead Channel Sensing Intrinsic Amplitude: 3.4 mV
Lead Channel Setting Pacing Amplitude: 2 V
Lead Channel Setting Pacing Pulse Width: 0.4 ms
Lead Channel Setting Sensing Sensitivity: 0.9 mV
MDC IDC MSMT BATTERY VOLTAGE: 2.97 V
MDC IDC MSMT LEADCHNL RA PACING THRESHOLD AMPLITUDE: 1 V
MDC IDC MSMT LEADCHNL RV IMPEDANCE VALUE: 520 Ohm
MDC IDC MSMT LEADCHNL RV PACING THRESHOLD PULSEWIDTH: 0.4 ms
MDC IDC SET LEADCHNL RV PACING AMPLITUDE: 2.5 V
MDC IDC STAT BRADY AP VS PERCENT: 51 %
MDC IDC STAT BRADY AS VP PERCENT: 10.9 %
MDC IDC STAT BRADY AS VS PERCENT: 33.2 %
Zone Setting Detection Interval: 350 ms
Zone Setting Detection Interval: 400 ms

## 2014-01-06 NOTE — Progress Notes (Signed)
PCP: Kennon Portela, MD Primary Cardiologist: [name] is a 58 y.o. male who presents today for routine electrophysiology followup.  Since last being seen in our clinic, the patient reports doing reasonably well.  His atypical chest pain has resolved.  He has persistent shortness of breath when walking on incline, but stable.   He is unaware of afib.  He was on top of his roof recently and fell off, landing on the ground 12 feet below.  Fortunately, he did not sustain significant injury.  Today, he denies symptoms of palpitations, chest pain,   lower extremity edema, dizziness, presyncope, or syncope.  The patient is otherwise without complaint today.   Past Medical History  Diagnosis Date  . Coronary artery disease     s/p CABG 2010, s/p Successful PCI  10/17/11 of Distal Left Main and the Left Circumflex with a Promus Element DES stent -- 4.0 mm x 16 mm postdilated to 4.12 mm   . Hypertension   . Anxiety   . DDD (degenerative disc disease)   . Atrial fibrillation     paroxysmal  . S/P coronary artery stent placement to distal Lt. main and Lt. Cx 10/17/11  . 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 Coagulopathy     persistent elevation of ACT during 10/17/11 hospitalization  . Atrial flutter   . Hyperlipidemia   . Heart murmur   . Kidney stones    Past Surgical History  Procedure Laterality Date  . Coronary angioplasty  10/19/11  . Coronary artery bypass graft  2010  . Insert / replace / remove pacemaker  2/12    Medtronic  . Tee without cardioversion  04/23/2012    Procedure: TRANSESOPHAGEAL ECHOCARDIOGRAM (TEE);  Surgeon: Josue Hector, MD;  Location: [facility] ENDOSCOPY;  Service: Cardiovascular;  Laterality: N/A;  spoke to pt about time change from 11a to 12p/DL  . Atrial fibrillation ablation  04/24/12, 12/02/12    PVI x2 and CTI ablation by Dr Rayann Heman  . Tee without cardioversion N/A 12/01/2012    Procedure: TRANSESOPHAGEAL  ECHOCARDIOGRAM (TEE);  Surgeon: [name], MD;  Location: [facility] ENDOSCOPY;  Service: Cardiovascular;  Laterality: N/A;  . Nm myocar perf wall motion  03/23/2011    Low risk    Current Outpatient Prescriptions  Medication Sig Dispense Refill  . aspirin 81 MG tablet Take 81 mg by mouth at bedtime.       Marland Kitchen atenolol (TENORMIN) 50 MG tablet TAKE TWO TABLETS BY MOUTH ONCE DAILY  60 tablet  0  . clonazePAM (KLONOPIN) 0.5 MG tablet Take 1 tablet (0.5 mg total) by mouth 2 (two) times daily as needed for anxiety.  30 tablet  1  . clopidogrel (PLAVIX) 75 MG tablet Take 75 mg by mouth daily.      Marland Kitchen diltiazem (DILACOR XR) 180 MG 24 hr capsule Take 1 capsule (180 mg total) by mouth daily.  90 capsule  1  . dofetilide (TIKOSYN) 500 MCG capsule Take 1 capsule (500 mcg total) by mouth 2 (two) times daily.  180 capsule  3  . fish oil-omega-3 fatty acids 1000 MG capsule Take 2 g by mouth 2 (two) times daily.      Marland Kitchen KLOR-CON M10 10 MEQ tablet TAKE ONE TABLET BY MOUTH ONCE DAILY  30 tablet  4  . lisinopril (PRINIVIL,ZESTRIL) 20 MG tablet Take 1 tablet (20 mg total) by mouth daily.  30 tablet  9  .  nitroGLYCERIN (NITROSTAT) 0.4 MG SL tablet Place 1 tablet (0.4 mg total) under the tongue every 5 (five) minutes as needed for chest pain.  25 tablet  3  . pantoprazole (PROTONIX) 40 MG tablet Take 1 tablet (40 mg total) by mouth daily.  30 tablet  5  . pravastatin (PRAVACHOL) 40 MG tablet Take 1 tablet (40 mg total) by mouth daily.  30 tablet  5  . venlafaxine XR (EFFEXOR-XR) 150 MG 24 hr capsule TAKE ONE CAPSULE BY MOUTH ONCE DAILY TAKE  WITH  75MG   TO  EQUAL  A  TOTAL  OF  225MG   DAILY  30 capsule  9  . venlafaxine XR (EFFEXOR-XR) 75 MG 24 hr capsule TAKE ONE CAPSULE BY MOUTH ONCE DAILY. TAKE  WITH  150  MG  TO  EQUAL  A  TOTAL  OF  225 MG  DAILY  30 capsule  9  . warfarin (COUMADIN) 5 MG tablet TAKE ONE & ONE-HALF TABLETS BY MOUTH EVERY DAY OR AS DIRECTED  50 tablet  2   No current facility-administered medications  for this visit.    Physical Exam: Filed Vitals:   01/06/14 1041  BP: 139/86  Pulse: 69  Height: 5\' 10"  (1.778 m)  Weight: 229 lb 9.6 oz (104.146 kg)    GEN- The patient is well appearing, alert and oriented x 3 today.   Head- normocephalic, atraumatic Eyes-  Sclera clear, conjunctiva pink Ears- hearing intact Oropharynx- clear Lungs- Clear to ausculation bilaterally, normal work of breathing Chest- pacemaker pocket is well healed Heart- Regular rate and rhythm, no murmurs, rubs or gallops, PMI not laterally displaced GI- soft, NT, ND, + BS Extremities- no clubbing, cyanosis, or edema  Pacemaker interrogation- reviewed in detail today,  See PACEART report  Assessment and Plan:  1. Atrial fibrillation 25% afib burden by device interrogation though he is tolerating this well.  No changes at this time Continue anticoagulation  2. CAD Doing well without ischemic symptoms Stop ASA Consider stopping plavix eventually--> I will defer to Dr Sallyanne Kuster  3. htn Stable No change required today  4. Tachy/brady Normal pacemaker function See Pace Art report No changes today  Follow-up with Dr Sallyanne Kuster as scheduled I will see in 1 year.  I am happy to see sooner if he has additional problems with his afib

## 2014-01-06 NOTE — Patient Instructions (Addendum)
Remote monitoring is used to monitor your Pacemaker of ICD from home. This monitoring reduces the number of office visits required to check your device to one time per year. It allows [place] to keep an eye on the functioning of your device to ensure it is working properly. You are scheduled for a device check from home on 04/08/14. You may send your transmission at any time that day. If you have a wireless device, the transmission will be sent automatically. After your physician reviews your transmission, you will receive a postcard with your next transmission date.  Your physician recommends that you schedule a follow-up appointment in 3 months with Dr C  Your physician wants you to follow-up in: 12 months with Dr Vallery Ridge will receive a reminder letter in the mail two months in advance. If you don't receive a letter, please call our office to schedule the follow-up appointment.  Your physician has recommended you make the following change in your medication:  1) Stop Aspirin

## 2014-01-07 NOTE — Telephone Encounter (Signed)
Encounter Closed--TP 01/07/2014 

## 2014-01-22 ENCOUNTER — Ambulatory Visit (INDEPENDENT_AMBULATORY_CARE_PROVIDER_SITE_OTHER): Payer: BC Managed Care – PPO | Admitting: Pharmacist Clinician (PhC)/ Clinical Pharmacy Specialist

## 2014-01-22 DIAGNOSIS — I4891 Unspecified atrial fibrillation: Secondary | ICD-10-CM

## 2014-01-22 DIAGNOSIS — I480 Paroxysmal atrial fibrillation: Secondary | ICD-10-CM

## 2014-01-22 DIAGNOSIS — Z7901 Long term (current) use of anticoagulants: Secondary | ICD-10-CM

## 2014-01-22 LAB — POCT INR: INR: 3.8

## 2014-01-23 ENCOUNTER — Other Ambulatory Visit: Payer: Self-pay | Admitting: Internal Medicine

## 2014-01-25 ENCOUNTER — Encounter: Payer: Self-pay | Admitting: Family Medicine

## 2014-01-27 ENCOUNTER — Encounter: Payer: Self-pay | Admitting: Family Medicine

## 2014-02-05 ENCOUNTER — Ambulatory Visit (INDEPENDENT_AMBULATORY_CARE_PROVIDER_SITE_OTHER): Payer: BC Managed Care – PPO | Admitting: Pharmacist Clinician (PhC)/ Clinical Pharmacy Specialist

## 2014-02-05 DIAGNOSIS — I4891 Unspecified atrial fibrillation: Secondary | ICD-10-CM

## 2014-02-05 DIAGNOSIS — Z7901 Long term (current) use of anticoagulants: Secondary | ICD-10-CM

## 2014-02-05 DIAGNOSIS — I480 Paroxysmal atrial fibrillation: Secondary | ICD-10-CM

## 2014-02-05 LAB — POCT INR: INR: 1.8

## 2014-02-22 ENCOUNTER — Ambulatory Visit (INDEPENDENT_AMBULATORY_CARE_PROVIDER_SITE_OTHER): Payer: BC Managed Care – PPO | Admitting: Pharmacist Clinician (PhC)/ Clinical Pharmacy Specialist

## 2014-02-22 DIAGNOSIS — I480 Paroxysmal atrial fibrillation: Secondary | ICD-10-CM

## 2014-02-22 DIAGNOSIS — I4891 Unspecified atrial fibrillation: Secondary | ICD-10-CM

## 2014-02-22 DIAGNOSIS — Z7901 Long term (current) use of anticoagulants: Secondary | ICD-10-CM

## 2014-02-22 LAB — POCT INR: INR: 3.6

## 2014-02-24 ENCOUNTER — Other Ambulatory Visit: Payer: Self-pay | Admitting: Internal Medicine

## 2014-03-03 ENCOUNTER — Other Ambulatory Visit: Payer: Self-pay | Admitting: Physician Assistant

## 2014-03-08 ENCOUNTER — Ambulatory Visit (INDEPENDENT_AMBULATORY_CARE_PROVIDER_SITE_OTHER): Payer: BC Managed Care – PPO | Admitting: Pharmacist Clinician (PhC)/ Clinical Pharmacy Specialist

## 2014-03-08 VITALS — BP 130/82 | HR 64

## 2014-03-08 DIAGNOSIS — I4891 Unspecified atrial fibrillation: Secondary | ICD-10-CM

## 2014-03-08 DIAGNOSIS — Z7901 Long term (current) use of anticoagulants: Secondary | ICD-10-CM

## 2014-03-08 DIAGNOSIS — I480 Paroxysmal atrial fibrillation: Secondary | ICD-10-CM

## 2014-03-08 LAB — POCT INR: INR: 4.8

## 2014-03-22 ENCOUNTER — Ambulatory Visit (INDEPENDENT_AMBULATORY_CARE_PROVIDER_SITE_OTHER): Payer: BC Managed Care – PPO | Admitting: Pharmacist

## 2014-03-22 DIAGNOSIS — I480 Paroxysmal atrial fibrillation: Secondary | ICD-10-CM

## 2014-03-22 DIAGNOSIS — I4891 Unspecified atrial fibrillation: Secondary | ICD-10-CM

## 2014-03-22 DIAGNOSIS — Z7901 Long term (current) use of anticoagulants: Secondary | ICD-10-CM

## 2014-03-22 LAB — POCT INR: INR: 2.3

## 2014-03-23 ENCOUNTER — Other Ambulatory Visit: Payer: Self-pay | Admitting: Internal Medicine

## 2014-03-23 NOTE — Telephone Encounter (Signed)
Rx was sent to pharmacy electronically. 

## 2014-04-05 ENCOUNTER — Ambulatory Visit (INDEPENDENT_AMBULATORY_CARE_PROVIDER_SITE_OTHER): Payer: BC Managed Care – PPO | Admitting: Pharmacist Clinician (PhC)/ Clinical Pharmacy Specialist

## 2014-04-05 DIAGNOSIS — I4891 Unspecified atrial fibrillation: Secondary | ICD-10-CM

## 2014-04-05 DIAGNOSIS — I480 Paroxysmal atrial fibrillation: Secondary | ICD-10-CM

## 2014-04-05 DIAGNOSIS — Z7901 Long term (current) use of anticoagulants: Secondary | ICD-10-CM

## 2014-04-05 LAB — POCT INR: INR: 1.9

## 2014-04-06 ENCOUNTER — Telehealth: Payer: Self-pay | Admitting: Pharmacist Clinician (PhC)/ Clinical Pharmacy Specialist

## 2014-04-06 NOTE — Telephone Encounter (Signed)
Pt wife called with concern about his lack of energy.  States he seems drained, lethargic for past 1-2 months.  She believes this is due to the fact that his testosterone was d/c'd about 3 months ago.  Wanted to know if we could refill it today when he comes for INR appointment.    Reviewed chart, can find no documentation that Dr. Loletha Grayer has prescribed or refilled this rx.  Dr. Jackalyn Lombard name was on one hospital order, but all outpatient orders were from other facility.  Explained to wife that at this time he would need to talk with Dr. Loletha Grayer and most likely have a testosterone level drawn first to see if this is truly the issue.  Or she could make him an appointment with his primary MD (he uses [facility] for PCP).    Wife voiced understanding.

## 2014-04-08 ENCOUNTER — Encounter: Payer: Self-pay | Admitting: Internal Medicine

## 2014-04-08 ENCOUNTER — Telehealth: Payer: Self-pay | Admitting: Cardiology

## 2014-04-08 ENCOUNTER — Ambulatory Visit (INDEPENDENT_AMBULATORY_CARE_PROVIDER_SITE_OTHER): Payer: BC Managed Care – PPO | Admitting: *Deleted

## 2014-04-08 DIAGNOSIS — I498 Other specified cardiac arrhythmias: Secondary | ICD-10-CM

## 2014-04-08 DIAGNOSIS — I4891 Unspecified atrial fibrillation: Secondary | ICD-10-CM

## 2014-04-08 DIAGNOSIS — R001 Bradycardia, unspecified: Secondary | ICD-10-CM

## 2014-04-08 DIAGNOSIS — I480 Paroxysmal atrial fibrillation: Secondary | ICD-10-CM

## 2014-04-08 NOTE — Telephone Encounter (Signed)
Spoke with pt and reminded pt of remote transmission that is due today. Pt verbalized understanding.   

## 2014-04-09 NOTE — Progress Notes (Signed)
Remote pacemaker transmission.   

## 2014-04-12 ENCOUNTER — Encounter: Payer: Self-pay | Admitting: Family Medicine

## 2014-04-12 ENCOUNTER — Ambulatory Visit (INDEPENDENT_AMBULATORY_CARE_PROVIDER_SITE_OTHER): Payer: BC Managed Care – PPO | Admitting: Family Medicine

## 2014-04-12 VITALS — BP 138/90 | HR 74 | Temp 98.5°F | Resp 16 | Ht 69.0 in | Wt 229.0 lb

## 2014-04-12 DIAGNOSIS — R5383 Other fatigue: Principal | ICD-10-CM

## 2014-04-12 DIAGNOSIS — E291 Testicular hypofunction: Secondary | ICD-10-CM

## 2014-04-12 DIAGNOSIS — R5381 Other malaise: Secondary | ICD-10-CM

## 2014-04-12 DIAGNOSIS — Z13220 Encounter for screening for lipoid disorders: Secondary | ICD-10-CM

## 2014-04-12 LAB — MDC_IDC_ENUM_SESS_TYPE_REMOTE
Brady Statistic AS VS Percent: 29.74 %
Brady Statistic RA Percent Paced: 59.33 %
Brady Statistic RV Percent Paced: 16.15 %
Date Time Interrogation Session: 20150723214130
Lead Channel Impedance Value: 568 Ohm
Lead Channel Sensing Intrinsic Amplitude: 17.3971
Lead Channel Setting Pacing Amplitude: 2.5 V
Lead Channel Setting Sensing Sensitivity: 0.9 mV
MDC IDC MSMT BATTERY VOLTAGE: 2.97 V
MDC IDC MSMT LEADCHNL RA IMPEDANCE VALUE: 536 Ohm
MDC IDC MSMT LEADCHNL RA SENSING INTR AMPL: 3.08 mV
MDC IDC SET LEADCHNL RA PACING AMPLITUDE: 2 V
MDC IDC SET LEADCHNL RV PACING PULSEWIDTH: 0.4 ms
MDC IDC STAT BRADY AP VP PERCENT: 5.21 %
MDC IDC STAT BRADY AP VS PERCENT: 54.11 %
MDC IDC STAT BRADY AS VP PERCENT: 10.93 %
Zone Setting Detection Interval: 350 ms
Zone Setting Detection Interval: 400 ms

## 2014-04-12 LAB — CBC WITH DIFFERENTIAL/PLATELET
BASOS ABS: 0.1 10*3/uL (ref 0.0–0.1)
Basophils Relative: 1 % (ref 0–1)
EOS ABS: 0.2 10*3/uL (ref 0.0–0.7)
EOS PCT: 3 % (ref 0–5)
HCT: 40.7 % (ref 39.0–52.0)
Hemoglobin: 14.5 g/dL (ref 13.0–17.0)
LYMPHS ABS: 1.3 10*3/uL (ref 0.7–4.0)
LYMPHS PCT: 20 % (ref 12–46)
MCH: 29.9 pg (ref 26.0–34.0)
MCHC: 35.6 g/dL (ref 30.0–36.0)
MCV: 83.9 fL (ref 78.0–100.0)
Monocytes Absolute: 0.5 10*3/uL (ref 0.1–1.0)
Monocytes Relative: 8 % (ref 3–12)
Neutro Abs: 4.5 10*3/uL (ref 1.7–7.7)
Neutrophils Relative %: 68 % (ref 43–77)
PLATELETS: 177 10*3/uL (ref 150–400)
RBC: 4.85 MIL/uL (ref 4.22–5.81)
RDW: 14.9 % (ref 11.5–15.5)
WBC: 6.6 10*3/uL (ref 4.0–10.5)

## 2014-04-12 LAB — LIPID PANEL
Cholesterol: 181 mg/dL (ref 0–200)
HDL: 33 mg/dL — ABNORMAL LOW (ref >39)
LDL Cholesterol: 71 mg/dL (ref 0–99)
Total CHOL/HDL Ratio: 5.5 Ratio
Triglycerides: 386 mg/dL — ABNORMAL HIGH (ref <150)
VLDL: 77 mg/dL — ABNORMAL HIGH (ref 0–40)

## 2014-04-12 LAB — COMPREHENSIVE METABOLIC PANEL
ALT: 28 U/L (ref 0–53)
AST: 24 U/L (ref 0–37)
Albumin: 4.2 g/dL (ref 3.5–5.2)
Alkaline Phosphatase: 77 U/L (ref 39–117)
BUN: 15 mg/dL (ref 6–23)
CALCIUM: 9.2 mg/dL (ref 8.4–10.5)
CHLORIDE: 101 meq/L (ref 96–112)
CO2: 25 meq/L (ref 19–32)
Creat: 0.78 mg/dL (ref 0.50–1.35)
GLUCOSE: 117 mg/dL — AB (ref 70–99)
Potassium: 4.3 mEq/L (ref 3.5–5.3)
SODIUM: 137 meq/L (ref 135–145)
TOTAL PROTEIN: 7.1 g/dL (ref 6.0–8.3)
Total Bilirubin: 0.6 mg/dL (ref 0.2–1.2)

## 2014-04-12 LAB — TSH: TSH: 1.18 u[IU]/mL (ref 0.350–4.500)

## 2014-04-12 LAB — TESTOSTERONE, FREE, TOTAL, SHBG
SEX HORMONE BINDING: 10 nmol/L — AB (ref 13–71)
TESTOSTERONE: 129 ng/dL — AB (ref 300–890)
Testosterone, Free: 40 pg/mL — ABNORMAL LOW (ref 47.0–244.0)
Testosterone-% Free: 3.1 % — ABNORMAL HIGH (ref 1.6–2.9)

## 2014-04-12 NOTE — Progress Notes (Addendum)
[facility] [address], [place] [address] [phone]  Date:  04/12/2014   Name:  Lucas Porter   DOB:  [birth year]   MRN:  [id number]  PCP:  Kennon Portela, MD    Chief Complaint: Medication Management and Fatigue   History of Present Illness:  Lucas Porter is a 58 y.o. very pleasant male patient who presents with the following:  Here today to recheck.  He had been using topical testosterone, but stopped taking it about 6 months ago. I presume this was stopped due to his anticoagulation therapy for a fib.  He is on rate control.  He has had intermittent a fib for about 4-5 years, they have tried to ablate him twice but it did not work so far.  He has been on coumadin for several years as well, and goes for his INR every 2- 4 weeks.    Wt Readings from Last 3 Encounters:  04/12/14 229 lb (103.874 kg)  01/06/14 229 lb 9.6 oz (104.146 kg)  12/17/13 230 lb 6.4 oz (104.509 kg)   He was on androgel. He had been on this for "a few years."    Patient Active Problem List   Diagnosis Date Noted  . 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 Symptomatic bradycardia 03/09/2013  . Long term (current) use of anticoagulants 12/03/2012  . BMI 40.0-44.9, adult 05/13/2012  . Atrial flutter 04/24/2012  . S/P coronary artery stent placement to distal Lt. main and Lt. Cx, 10/17/11 10/19/2011  . Angina pectoris 10/19/2011  . Coagulopathy 10/19/2011  . History of pacemaker, with a-v pacing 10/19/2011  . CAD (coronary artery disease) with CABG 2011, LIMA to LAD, Free radial to Lt. PDA, VG to Diag and ramus intermedius 10/02/2011  . Anxiety 10/02/2011  . Paroxysmal atrial fibrillation 10/02/2011  . Hypertension 10/02/2011  . Dyslipidemia 10/02/2011  . 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 Coronary artery disease     s/p CABG 2010, s/p Successful PCI  10/17/11 of Distal Left Main and the Left Circumflex with a Promus Element DES  stent -- 4.0 mm x 16 mm postdilated to 4.12 mm   . Hypertension   . Anxiety   . DDD (degenerative disc disease)   . Atrial fibrillation     paroxysmal  . S/P coronary artery stent placement to distal Lt. main and Lt. Cx 10/17/11  . 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 Coagulopathy     persistent elevation of ACT during 10/17/11 hospitalization  . Atrial flutter   . Hyperlipidemia   . Heart murmur   . Kidney stones     Past Surgical History  Procedure Laterality Date  . Coronary angioplasty  10/19/11  . Coronary artery bypass graft  2010  . Insert / replace / remove pacemaker  2/12    Medtronic  . Tee without cardioversion  04/23/2012    Procedure: TRANSESOPHAGEAL ECHOCARDIOGRAM (TEE);  Surgeon: Josue Hector, MD;  Location: [facility] ENDOSCOPY;  Service: Cardiovascular;  Laterality: N/A;  spoke to pt about time change from 11a to 12p/DL  . Atrial fibrillation ablation  04/24/12, 12/02/12    PVI x2 and CTI ablation by Dr Rayann Heman  . Tee without cardioversion N/A 12/01/2012    Procedure: TRANSESOPHAGEAL ECHOCARDIOGRAM (TEE);  Surgeon: [name], MD;  Location: [facility] ENDOSCOPY;  Service: Cardiovascular;  Laterality: N/A;  . Nm myocar perf wall motion  03/23/2011    Low risk    History  Substance Use Topics  . Smoking status: Former Smoker -- 1 years    Types: Cigarettes    Quit date: 12/18/1963  . Smokeless tobacco: Former Systems developer    Quit date: 10/03/2012     Comment: quit smoking in 1970's  . Alcohol Use: 0.6 oz/week    1 Cans of beer per week     Comment: rare    Family History  Problem Relation Age of Onset  . Coronary artery disease    . Stroke Brother   . Cancer Mother     No Known Allergies  Medication list has been reviewed and updated.  Current Outpatient Prescriptions on File Prior to Visit  Medication Sig Dispense Refill  . atenolol (TENORMIN) 50 MG tablet TAKE TWO TABLETS BY MOUTH ONCE DAILY  60 tablet  3  . clonazePAM (KLONOPIN) 0.5  MG tablet Take 1 tablet (0.5 mg total) by mouth 2 (two) times daily as needed for anxiety.  30 tablet  1  . diltiazem (DILACOR XR) 180 MG 24 hr capsule TAKE ONE CAPSULE BY MOUTH ONCE DAILY  90 capsule  0  . dofetilide (TIKOSYN) 500 MCG capsule Take 1 capsule (500 mcg total) by mouth 2 (two) times daily.  180 capsule  3  . fish oil-omega-3 fatty acids 1000 MG capsule Take 2 g by mouth 2 (two) times daily.      Marland Kitchen KLOR-CON M10 10 MEQ tablet TAKE ONE TABLET BY MOUTH ONCE DAILY  30 tablet  4  . lisinopril (PRINIVIL,ZESTRIL) 20 MG tablet Take 1 tablet (20 mg total) by mouth daily.  30 tablet  9  . nitroGLYCERIN (NITROSTAT) 0.4 MG SL tablet Place 1 tablet (0.4 mg total) under the tongue every 5 (five) minutes as needed for chest pain.  25 tablet  3  . pantoprazole (PROTONIX) 40 MG tablet Take 1 tablet (40 mg total) by mouth daily.  30 tablet  5  . pravastatin (PRAVACHOL) 40 MG tablet TAKE ONE TABLET BY MOUTH ONCE DAILY  30 tablet  5  . venlafaxine XR (EFFEXOR-XR) 150 MG 24 hr capsule TAKE ONE CAPSULE BY MOUTH ONCE DAILY TAKE  WITH  75MG  TO  EQUAL  A  TOTAL  OF  225MG  DAILY  30 capsule  9  . venlafaxine XR (EFFEXOR-XR) 75 MG 24 hr capsule TAKE ONE CAPSULE BY MOUTH ONCE DAILY. TAKE  WITH  150  MG  TO  EQUAL  A  TOTAL  OF  225 MG  DAILY  30 capsule  9  . warfarin (COUMADIN) 5 MG tablet TAKE ONE & ONE-HALF TABLETS BY MOUTH EVERY DAY OR AS DIRECTED  50 tablet  2   No current facility-administered medications on file prior to visit.    Review of Systems:  As per HPI- otherwise negative.   Physical Examination: Filed Vitals:   04/12/14 0945  BP: 138/90  Pulse: 74  Temp: 98.5 F (36.9 C)  Resp: 16   Filed Vitals:   04/12/14 0945  Height: 5' 9"  (1.753 m)  Weight: 229 lb (103.874 kg)   Body mass index is 33.8 kg/(m^2). Ideal Body Weight: Weight in (lb) to have BMI = 25: 168.9  GEN: WDWN, NAD, Non-toxic, A & O x 3, obese HEENT: Atraumatic, Normocephalic. Neck supple. No masses, No LAD. Ears  and Nose: No external deformity. CV: RRR, No M/G/R. No JVD. No thrill. No extra heart sounds.  Currently in sinus rhythm  PULM: CTA B, no wheezes, crackles, rhonchi. No retractions. No resp. distress. No accessory muscle use. ABD: S, NT, ND, +BS. No rebound. No HSM. EXTR: No c/c/e NEURO Normal gait.  PSYCH: Normally interactive. Conversant. Not depressed or anxious appearing.  Calm demeanor.    Assessment and Plan: Other malaise and fatigue - Plan: TSH, Comprehensive metabolic panel  Hypogonadism in male - Plan: CBC with Differential, PSA, Testosterone, free, total  Screening for hyperlipidemia - Plan: Lipid panel  Discussed with pt and his wife.  Will check his levels and then discuss what to do; I can touch base with his cardiologist and anti-coagulation provider to discuss testosterone if indicated.   Will plan further follow- up pending labs.   Signed Lucas Blinks, MD  Received his labs so far Results for orders placed in visit on 04/12/14  TSH      Result Value Ref Range   TSH 1.180  0.350 - 4.500 uIU/mL  CBC WITH DIFFERENTIAL      Result Value Ref Range   WBC 6.6  4.0 - 10.5 K/uL   RBC 4.85  4.22 - 5.81 MIL/uL   Hemoglobin 14.5  13.0 - 17.0 g/dL   HCT 40.7  39.0 - 52.0 %   MCV 83.9  78.0 - 100.0 fL   MCH 29.9  26.0 - 34.0 pg   MCHC 35.6  30.0 - 36.0 g/dL   RDW 14.9  11.5 - 15.5 %   Platelets 177  150 - 400 K/uL   Neutrophils Relative % 68  43 - 77 %   Neutro Abs 4.5  1.7 - 7.7 K/uL   Lymphocytes Relative 20  12 - 46 %   Lymphs Abs 1.3  0.7 - 4.0 K/uL   Monocytes Relative 8  3 - 12 %   Monocytes Absolute 0.5  0.1 - 1.0 K/uL   Eosinophils Relative 3  0 - 5 %   Eosinophils Absolute 0.2  0.0 - 0.7 K/uL   Basophils Relative 1  0 - 1 %   Basophils Absolute 0.1  0.0 - 0.1 K/uL   Smear Review Criteria for review not met    PSA      Result Value Ref Range   PSA 1.35  <=4.00 ng/mL  LIPID PANEL      Result Value Ref Range   Cholesterol 181  0 - 200 mg/dL    Triglycerides 386 (*) <150 mg/dL   HDL 33 (*) >39 mg/dL   Total CHOL/HDL Ratio 5.5     VLDL 77 (*) 0 - 40 mg/dL   LDL Cholesterol 71  0 - 99 mg/dL  COMPREHENSIVE METABOLIC PANEL      Result Value Ref Range   Sodium 137  135 - 145 mEq/L   Potassium 4.3  3.5 - 5.3 mEq/L   Chloride 101  96 - 112 mEq/L   CO2 25  19 - 32 mEq/L   Glucose, Bld 117 (*) 70 - 99 mg/dL   BUN 15  6 - 23 mg/dL   Creat 0.78  0.50 - 1.35 mg/dL   Total Bilirubin 0.6  0.2 - 1.2 mg/dL   Alkaline Phosphatase 77  39 - 117 U/L   AST 24  0 - 37 U/L   ALT 28  0 - 53 U/L   Total Protein 7.1  6.0 - 8.3 g/dL   Albumin 4.2  3.5 - 5.2 g/dL   Calcium 9.2  8.4 - 10.5 mg/dL  TESTOSTERONE, FREE, TOTAL  Result Value Ref Range   Testosterone 129 (*) 300 - 890 ng/dL   Sex Hormone Binding 10 (*) 13 - 71 nmol/L   Testosterone, Free 40.0 (*) 47.0 - 244.0 pg/mL   Testosterone-% Free 3.1 (*) 1.6 - 2.9 %    Send message to Dr. Rayann Heman and Cyril Mourning- will wait to hear back from them and for PSA result  8/3: heard back from [facility],  Dr. Edilia Bo   Please feel free to re-start his testosterone. We don't have record of stopping it for cardiac concerns/clot risks. Believe his insurance didn't want to cover it when his testosterone level showed WNL (while he was being treated). I will let him know to contact your office for instructions.   Thanks,  [employer] him and [facility].  Sent in Rx for androgel.  Will send a letter as well as I am not sure if he is getting mychart message  Meds ordered this encounter  Medications  . testosterone (ANDROGEL) 50 MG/5GM (1%) GEL    Sig: Place 5 g onto the skin daily.    Dispense:  30 Package    Refill:  5

## 2014-04-12 NOTE — Patient Instructions (Signed)
Good to see you today!   I will be in touch with your labs and we will make a plan

## 2014-04-13 LAB — PSA: PSA: 1.35 ng/mL (ref <=4.00)

## 2014-04-19 ENCOUNTER — Telehealth: Payer: Self-pay | Admitting: Pharmacist Clinician (PhC)/ Clinical Pharmacy Specialist

## 2014-04-19 MED ORDER — TESTOSTERONE 50 MG/5GM (1%) TD GEL: 5.0000 g | Freq: Every day | TRANSDERMAL | Status: DC

## 2014-04-19 NOTE — Addendum Note (Signed)
Addended by: Lamar Blinks C on: 04/19/2014 05:35 PM   Modules accepted: Orders

## 2014-04-19 NOTE — Telephone Encounter (Signed)
Reviewed with Dr. Loletha Grayer.  Pt was stopped for insurance issues, not cardiac.  Sent message to Dr. Edilia Bo that we're okay for her to restart.  Called pt to have him contact Dr. Edilia Bo for instructions.

## 2014-04-19 NOTE — Telephone Encounter (Signed)
Message copied by Rockne Menghini on Mon Apr 19, 2014  2:58 PM ------      Message from: Dionicio Stall      Created: Mon Apr 19, 2014  8:26 AM       Dr Rayann Heman will not be back in the office until 8/12.  I would ask Dr C.  Thanks      Claiborne Billings      ----- Message -----         From: Tommy Medal, RPH-CPP         Sent: 04/16/2014   4:06 PM           To: Dionicio Stall, RN            Claiborne Billings            Could you see if Dr. Rayann Heman responded to this.  It's his call, not mine.            Thanks,       Erasmo Downer       ----- Message -----         From: Darreld Mclean, MD         Sent: 04/12/2014   8:30 PM           To: Thompson Grayer, MD, Tommy Medal, RPH-CPP            Hello- hope you are both well.      I saw Demetrio for follow-up today.  (he has h/o CABG and intermittent a fib, on coumadin having failed ablation x2)  He is having sx of hypogonadism and wonders if he can restart his testosterone therapy.  I presume this was stopped due to his clot risk; is he ok to go back on now that his INR is steady and therapeutic?       JC             ------

## 2014-04-20 ENCOUNTER — Encounter: Payer: Self-pay | Admitting: Family Medicine

## 2014-04-21 ENCOUNTER — Telehealth: Payer: Self-pay

## 2014-04-21 NOTE — Telephone Encounter (Signed)
Advised pt that we are waiting on the fax from the pharmacy and the prior auth will be completed.

## 2014-04-21 NOTE — Telephone Encounter (Signed)
[place] 50MG S PT WAS GIVEN ISN'T COVERED BY INSURANCE, BUT IF THEY CAN GET IT THROUGH A PRIOR AUTHORIZATION, IT WILL BE COVERED PLEASE CALL THE INSURANCE AT [phone] AND YOU MAY REACH PT [phone]

## 2014-04-23 ENCOUNTER — Telehealth: Payer: Self-pay

## 2014-04-23 NOTE — Telephone Encounter (Signed)
Completed form on covermymeds and faxed lab results as directed. Notified wife that we never got a fax from [place], but confirmed that ins is correct and advised I will call when we have a decision.

## 2014-04-23 NOTE — Telephone Encounter (Signed)
Patient says pharmacy has not received prior auth "[id number] - [place], [place] - [address]"

## 2014-04-26 ENCOUNTER — Other Ambulatory Visit: Payer: Self-pay | Admitting: Internal Medicine

## 2014-04-26 ENCOUNTER — Other Ambulatory Visit: Payer: Self-pay | Admitting: Cardiovascular Disease

## 2014-04-26 NOTE — Telephone Encounter (Signed)
Rx was sent to pharmacy electronically. 

## 2014-04-27 NOTE — Telephone Encounter (Signed)
PA approved through 04/27/15. Notified pharm and pt's wife on VM.

## 2014-04-29 ENCOUNTER — Encounter: Payer: Self-pay | Admitting: Cardiology

## 2014-05-03 ENCOUNTER — Encounter: Payer: Self-pay | Admitting: Cardiology

## 2014-05-03 ENCOUNTER — Ambulatory Visit (INDEPENDENT_AMBULATORY_CARE_PROVIDER_SITE_OTHER): Payer: BC Managed Care – PPO | Admitting: Pharmacist Clinician (PhC)/ Clinical Pharmacy Specialist

## 2014-05-03 DIAGNOSIS — I480 Paroxysmal atrial fibrillation: Secondary | ICD-10-CM

## 2014-05-03 DIAGNOSIS — I4891 Unspecified atrial fibrillation: Secondary | ICD-10-CM

## 2014-05-03 DIAGNOSIS — Z7901 Long term (current) use of anticoagulants: Secondary | ICD-10-CM

## 2014-05-03 LAB — POCT INR: INR: 2.1

## 2014-05-04 ENCOUNTER — Telehealth: Payer: Self-pay

## 2014-05-04 NOTE — Telephone Encounter (Signed)
Lucas Porter WANTED THE DR TO KNOW THE ANDROGEL 1% WAS OVER 125.00 EVEN WITH INSURANCE, SOMEONE HAD TOLD THEM TO CALL [facility] AND IT MAY BE CHEAPER, WANTED TO CHECK WITH THE DR [name] [phone]

## 2014-05-06 NOTE — Telephone Encounter (Signed)
Lm for pt- pick up rx tomorrow and the discount card.

## 2014-05-06 NOTE — Telephone Encounter (Signed)
Patient's wife called. Please return call. CB # [phone]

## 2014-05-06 NOTE — Telephone Encounter (Signed)
We have a discount card for Fortesta Gel for pt if acceptable to change prescription to this instead of androgel. I have one saved for him in the nurses box.

## 2014-05-06 NOTE — Telephone Encounter (Signed)
Called but had to [facility].  If ok with them will change to the [place] and will write for this tomorrow.  They can pick up rx and coupon card.

## 2014-05-07 ENCOUNTER — Other Ambulatory Visit: Payer: Self-pay | Admitting: *Deleted

## 2014-05-07 MED ORDER — TESTOSTERONE 10 MG/ACT (2%) TD GEL: TRANSDERMAL | Status: DC

## 2014-05-07 NOTE — Telephone Encounter (Signed)
Will change to fortesa- there is a savings card that he can use.   He will come and pick this up.

## 2014-05-10 ENCOUNTER — Telehealth: Payer: Self-pay

## 2014-05-10 DIAGNOSIS — E291 Testicular hypofunction: Secondary | ICD-10-CM

## 2014-05-10 NOTE — Telephone Encounter (Signed)
Coupon for forstesta expired in 2011. Insurance does not cover the medication. Patient would like an up to date coupon.

## 2014-05-10 NOTE — Telephone Encounter (Signed)
LM new coupon in pick up drawer.

## 2014-05-11 NOTE — Telephone Encounter (Signed)
Wife called and states that the coupons still have the 2011 dates

## 2014-05-12 NOTE — Telephone Encounter (Signed)
Completed a PA on Fortesta but it was denied bc it is not on ins plan formulary. The only two topical meds are Androgel and Axiron. I was able to go online and print out a coupon savings card for Axiron. Dr Lorelei Pont, do you want to change Rx to [place]?

## 2014-05-12 NOTE — Telephone Encounter (Signed)
LM for [name] the date of 2011 is the trademark date for the drug company it is not an expiration date.

## 2014-05-13 MED ORDER — TESTOSTERONE 30 MG/ACT TD SOLN: TRANSDERMAL | Status: DC

## 2014-05-13 NOTE — Telephone Encounter (Signed)
Changed to axiron and left paper rx up front for him.  Called and let him know. Note to come in for a recheck TSH in 3 weeks or so attached to tx

## 2014-05-13 NOTE — Addendum Note (Signed)
Addended by: Lamar Blinks C on: 05/13/2014 11:23 AM   Modules accepted: Orders, Medications

## 2014-05-14 NOTE — Telephone Encounter (Signed)
PA was needed for Axiron, completed on phone and approved through 05/14/15. Notified pharm.

## 2014-05-17 ENCOUNTER — Telehealth: Payer: Self-pay | Admitting: *Deleted

## 2014-05-17 NOTE — Telephone Encounter (Addendum)
PER Dr Allred: Pt with afib. EF in 2014 by TEE was 45%. We should probably obtain a 2D TTE to see what his EF is now. If EF <50 would be a candidate for genetic AF.   Amber can you call patient to see if he is willing to have a 2D echo... Given CAD, fatigue, SOB, etc I think he is due for one.   Shawn, He has a Medtronic PPM also.    I have left a message for patient to call me back in regards to how he was feeling when he saw Dr Edilia Bo back in July.  Will discuss need for echo when he calls me back--Charnetta Wulff Gaylyn Rong

## 2014-05-28 ENCOUNTER — Other Ambulatory Visit: Payer: Self-pay | Admitting: Internal Medicine

## 2014-05-31 ENCOUNTER — Ambulatory Visit (INDEPENDENT_AMBULATORY_CARE_PROVIDER_SITE_OTHER): Payer: BC Managed Care – PPO | Admitting: Pharmacist Clinician (PhC)/ Clinical Pharmacy Specialist

## 2014-05-31 ENCOUNTER — Other Ambulatory Visit (HOSPITAL_COMMUNITY): Payer: Self-pay | Admitting: Internal Medicine

## 2014-05-31 ENCOUNTER — Ambulatory Visit (HOSPITAL_COMMUNITY)
Admission: RE | Admit: 2014-05-31 | Discharge: 2014-05-31 | Disposition: A | Discharge status: Home / Self Care | Payer: BC Managed Care – PPO | Source: Ambulatory Visit | Attending: Cardiology | Admitting: Cardiology

## 2014-05-31 DIAGNOSIS — R5383 Other fatigue: Secondary | ICD-10-CM

## 2014-05-31 DIAGNOSIS — Z87891 Personal history of nicotine dependence: Secondary | ICD-10-CM | POA: Insufficient documentation

## 2014-05-31 DIAGNOSIS — R0602 Shortness of breath: Secondary | ICD-10-CM

## 2014-05-31 DIAGNOSIS — I4892 Unspecified atrial flutter: Secondary | ICD-10-CM | POA: Insufficient documentation

## 2014-05-31 DIAGNOSIS — R5381 Other malaise: Secondary | ICD-10-CM

## 2014-05-31 DIAGNOSIS — I10 Essential (primary) hypertension: Secondary | ICD-10-CM | POA: Diagnosis not present

## 2014-05-31 DIAGNOSIS — I480 Paroxysmal atrial fibrillation: Secondary | ICD-10-CM

## 2014-05-31 DIAGNOSIS — I059 Rheumatic mitral valve disease, unspecified: Secondary | ICD-10-CM | POA: Diagnosis not present

## 2014-05-31 DIAGNOSIS — I2510 Atherosclerotic heart disease of native coronary artery without angina pectoris: Secondary | ICD-10-CM | POA: Insufficient documentation

## 2014-05-31 DIAGNOSIS — R079 Chest pain, unspecified: Secondary | ICD-10-CM | POA: Insufficient documentation

## 2014-05-31 DIAGNOSIS — I4891 Unspecified atrial fibrillation: Secondary | ICD-10-CM

## 2014-05-31 DIAGNOSIS — R011 Cardiac murmur, unspecified: Secondary | ICD-10-CM | POA: Diagnosis not present

## 2014-05-31 DIAGNOSIS — I25119 Atherosclerotic heart disease of native coronary artery with unspecified angina pectoris: Secondary | ICD-10-CM

## 2014-05-31 DIAGNOSIS — I498 Other specified cardiac arrhythmias: Secondary | ICD-10-CM | POA: Insufficient documentation

## 2014-05-31 DIAGNOSIS — I209 Angina pectoris, unspecified: Secondary | ICD-10-CM | POA: Insufficient documentation

## 2014-05-31 DIAGNOSIS — E785 Hyperlipidemia, unspecified: Secondary | ICD-10-CM | POA: Insufficient documentation

## 2014-05-31 DIAGNOSIS — Z7901 Long term (current) use of anticoagulants: Secondary | ICD-10-CM

## 2014-05-31 LAB — POCT INR: INR: 2.3

## 2014-05-31 NOTE — Progress Notes (Signed)
2D Echocardiogram Complete.  05/31/2014   Harmony Sandell Manti, [place]

## 2014-06-16 ENCOUNTER — Encounter: Payer: Self-pay | Admitting: Cardiology

## 2014-06-21 ENCOUNTER — Other Ambulatory Visit: Payer: Self-pay | Admitting: Pharmacist Clinician (PhC)/ Clinical Pharmacy Specialist

## 2014-06-28 ENCOUNTER — Ambulatory Visit (INDEPENDENT_AMBULATORY_CARE_PROVIDER_SITE_OTHER): Payer: BC Managed Care – PPO | Admitting: Pharmacist Clinician (PhC)/ Clinical Pharmacy Specialist

## 2014-06-28 DIAGNOSIS — Z7901 Long term (current) use of anticoagulants: Secondary | ICD-10-CM

## 2014-06-28 DIAGNOSIS — I480 Paroxysmal atrial fibrillation: Secondary | ICD-10-CM

## 2014-06-28 LAB — POCT INR: INR: 2

## 2014-07-01 ENCOUNTER — Other Ambulatory Visit: Payer: Self-pay | Admitting: Cardiovascular Disease

## 2014-07-02 NOTE — Telephone Encounter (Signed)
Rx was sent to pharmacy electronically. OV 10/19

## 2014-07-05 ENCOUNTER — Encounter: Payer: Self-pay | Admitting: Cardiovascular Disease

## 2014-07-05 ENCOUNTER — Other Ambulatory Visit: Payer: Self-pay | Admitting: Internal Medicine

## 2014-07-05 ENCOUNTER — Ambulatory Visit (INDEPENDENT_AMBULATORY_CARE_PROVIDER_SITE_OTHER): Payer: BC Managed Care – PPO | Admitting: Cardiovascular Disease

## 2014-07-05 ENCOUNTER — Ambulatory Visit (INDEPENDENT_AMBULATORY_CARE_PROVIDER_SITE_OTHER): Payer: BC Managed Care – PPO | Admitting: *Deleted

## 2014-07-05 VITALS — BP 128/90 | HR 75 | Ht 69.0 in | Wt 234.1 lb

## 2014-07-05 DIAGNOSIS — R001 Bradycardia, unspecified: Secondary | ICD-10-CM

## 2014-07-05 DIAGNOSIS — R55 Syncope and collapse: Secondary | ICD-10-CM

## 2014-07-05 DIAGNOSIS — Z950 Presence of cardiac pacemaker: Secondary | ICD-10-CM

## 2014-07-05 DIAGNOSIS — I10 Essential (primary) hypertension: Secondary | ICD-10-CM

## 2014-07-05 DIAGNOSIS — I2510 Atherosclerotic heart disease of native coronary artery without angina pectoris: Secondary | ICD-10-CM

## 2014-07-05 DIAGNOSIS — I480 Paroxysmal atrial fibrillation: Secondary | ICD-10-CM

## 2014-07-05 DIAGNOSIS — Z6841 Body Mass Index (BMI) 40.0 and over, adult: Secondary | ICD-10-CM

## 2014-07-05 DIAGNOSIS — I4892 Unspecified atrial flutter: Secondary | ICD-10-CM

## 2014-07-05 DIAGNOSIS — E785 Hyperlipidemia, unspecified: Secondary | ICD-10-CM

## 2014-07-05 DIAGNOSIS — F419 Anxiety disorder, unspecified: Secondary | ICD-10-CM

## 2014-07-05 DIAGNOSIS — Z8679 Personal history of other diseases of the circulatory system: Secondary | ICD-10-CM

## 2014-07-05 DIAGNOSIS — Z955 Presence of coronary angioplasty implant and graft: Secondary | ICD-10-CM

## 2014-07-05 LAB — MDC_IDC_ENUM_SESS_TYPE_INCLINIC
Battery Voltage: 2.99 V
Brady Statistic AP VP Percent: 5.1 %
Brady Statistic AP VS Percent: 55.9 %
Brady Statistic AS VP Percent: 11 %
Lead Channel Impedance Value: 560 Ohm
Lead Channel Sensing Intrinsic Amplitude: 1.2 mV
Lead Channel Sensing Intrinsic Amplitude: 20 mV
Lead Channel Setting Pacing Amplitude: 2.5 V
MDC IDC MSMT LEADCHNL RA IMPEDANCE VALUE: 496 Ohm
MDC IDC SET LEADCHNL RA PACING AMPLITUDE: 2 V
MDC IDC SET LEADCHNL RV PACING PULSEWIDTH: 0.4 ms
MDC IDC SET LEADCHNL RV SENSING SENSITIVITY: 0.9 mV
MDC IDC STAT BRADY AS VS PERCENT: 27.9 %
Zone Setting Detection Interval: 350 ms
Zone Setting Detection Interval: 400 ms

## 2014-07-05 NOTE — Progress Notes (Signed)
Patient ID: BEATRIZ SETTLES, male   DOB: 1956/09/07, 58 y.o.   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 This has made him less physically active and he has gained substantial weight: 18 pounds since last year.  He continues to be occasionally troubled by palpitations, but this appears to be a lesser concern that it was last year. Interrogation of his pacemaker shows a relatively constant 20-25% burden of atrial fibrillation with consistently well-controlled ventricular rates. He has not had any bleeding complications on chronic warfarin therapy and has not had any focal neurological deficits. He is on chronic dofetilide therapy and today his QTc interval is in the normal range. He has had 2 previous radiofrequency ablation procedures.  He has not had any more episodes of neurocardiogenic syncope since his last appointment.  His pacemaker is functioning normally. I think Dr. Rayann Heman has been following this on a routine basis. I only got a download from his device today for diagnostic purposes.  He has not had any episodes of angina pectoris. He has rare brief twinges of discomfort in his chest when he bends over. Bending over makes him substantially short of breath, I think this may be a consequence of his prominent abdominal obesity.   No Known Allergies  Current Outpatient Prescriptions  Medication Sig Dispense Refill  . atenolol (TENORMIN) 50 MG tablet TAKE TWO TABLETS BY MOUTH ONCE DAILY  60 tablet  3  . clonazePAM (KLONOPIN) 0.5 MG tablet Take 1 tablet (0.5 mg total) by mouth 2 (two) times daily as needed for anxiety.  30 tablet  1  . clopidogrel (PLAVIX) 75 MG tablet TAKE  ONE TABLET BY MOUTH ONCE DAILY  60 tablet  0  . diltiazem (DILACOR XR) 180 MG 24 hr capsule TAKE ONE CAPSULE BY MOUTH ONCE DAILY  90 capsule  0  . dofetilide (TIKOSYN) 500 MCG capsule Take 1 capsule (500 mcg total) by mouth 2 (two) times daily.  180 capsule  3  . fish oil-omega-3 fatty acids 1000 MG capsule Take 2 g by mouth 2 (two) times daily.      Marland Kitchen lisinopril (PRINIVIL,ZESTRIL) 20 MG tablet Take 1 tablet (20 mg total) by mouth daily.  30 tablet  9  . nitroGLYCERIN (NITROSTAT) 0.4 MG SL tablet Place 1 tablet (0.4 mg total) under the tongue every 5 (five) minutes as needed for chest pain.  25 tablet  3  . pantoprazole (PROTONIX) 40 MG tablet Take 1 tablet (40 mg total) by mouth daily.  30 tablet  5  . potassium chloride (KLOR-CON M10) 10 MEQ tablet Take 1 tablet (10 mEq total) by mouth daily.  30 tablet  0  . pravastatin (PRAVACHOL) 40 MG tablet TAKE ONE TABLET BY MOUTH ONCE DAILY  30 tablet  5  . Testosterone (AXIRON) 30 MG/ACT SOLN Apply 30mg  to each axilla (60mg  total) once daily in the morning  90 mL  2  . venlafaxine XR (EFFEXOR-XR) 150 MG 24 hr capsule TAKE ONE CAPSULE BY MOUTH ONCE DAILY TAKE  WITH  75MG   TO  EQUAL  A  TOTAL  OF  225MG   DAILY  30 capsule  9  . venlafaxine XR (EFFEXOR-XR) 75 MG 24 hr capsule TAKE ONE CAPSULE BY  MOUTH ONCE DAILY. TAKE  WITH  150  MG  TO  EQUAL  A  TOTAL  OF  225 MG  DAILY  30 capsule  9  . warfarin (COUMADIN) 5 MG tablet Take 1 tablet by mouth daily or as directed by coumadin clinic  90 tablet  1   No current facility-administered medications for this visit.    Past Medical History  Diagnosis Date  . Coronary artery disease     s/p CABG 2010, s/p Successful PCI  10/17/11 of Distal Left Main and the Left Circumflex with a Promus Element DES stent -- 4.0 mm x 16 mm postdilated to 4.12 mm   . Hypertension   . Anxiety   . DDD (degenerative disc disease)   . Atrial fibrillation     paroxysmal  . S/P coronary artery stent placement to distal Lt. main and  Lt. Cx 10/17/11  . 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 Coagulopathy     persistent elevation of ACT during 10/17/11 hospitalization  . Atrial flutter   . Hyperlipidemia   . Heart murmur   . Kidney stones     Past Surgical History  Procedure Laterality Date  . Coronary angioplasty  10/19/11  . Coronary artery bypass graft  2010  . Insert / replace / remove pacemaker  2/12    Medtronic  . Tee without cardioversion  04/23/2012    Procedure: TRANSESOPHAGEAL ECHOCARDIOGRAM (TEE);  Surgeon: Josue Hector, MD;  Location: [facility] ENDOSCOPY;  Service: Cardiovascular;  Laterality: N/A;  spoke to pt about time change from 11a to 12p/DL  . Atrial fibrillation ablation  04/24/12, 12/02/12    PVI x2 and CTI ablation by Dr Rayann Heman  . Tee without cardioversion N/A 12/01/2012    Procedure: TRANSESOPHAGEAL ECHOCARDIOGRAM (TEE);  Surgeon: [name], MD;  Location: [facility] ENDOSCOPY;  Service: Cardiovascular;  Laterality: N/A;  . Nm myocar perf wall motion  03/23/2011    Low risk    Family History  Problem Relation Age of Onset  . Coronary artery disease    . Stroke Brother   . Cancer Mother     History   Social History  . Marital Status: Married    Spouse Name: N/A    Number of Children: N/A  . Years of Education: N/A   Occupational History  . Not on file.   Social History Main Topics  . Smoking status: Former Smoker -- 1 years    Types: Cigarettes    Quit date: 12/18/1963  . Smokeless tobacco: Former Systems developer    Quit date: 10/03/2012     Comment: quit smoking in 1970's  . Alcohol Use: 0.6 oz/week    1 Cans of beer per week     Comment: rare  . Drug Use: No  . Sexual Activity: Yes   Other Topics Concern  . Not on file   Social History Narrative   Pt lives in [place] with spouse.  2 grown daughters.  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    Review of systems: Infrequent palpitations. The patient specifically denies any chest pain at rest or with exertion,  dyspnea at rest or with exertion, orthopnea, paroxysmal nocturnal dyspnea, syncope, focal neurological deficits, intermittent claudication, lower extremity edema, unexplained weight gain, cough, hemoptysis or wheezing.  The patient also denies abdominal pain, nausea, vomiting, dysphagia, diarrhea, constipation, polyuria, polydipsia, dysuria, hematuria, frequency, urgency, abnormal bleeding or bruising, fever, chills, unexpected weight changes, mood swings, change in  skin or hair texture, change in voice quality, auditory or visual problems, allergic reactions or rashes, new musculoskeletal complaints other than usual "aches and pains".   PHYSICAL EXAM BP 128/90  Pulse 75  Ht 5\' 9"  (1.753 m)  Wt 106.187 kg (234 lb 1.6 oz)  BMI 34.55 kg/m2 General: Alert, oriented x3, no distress  Head: no evidence of trauma, PERRL, EOMI, no exophtalmos or lid lag, no myxedema, no xanthelasma; normal ears, nose and oropharynx  Neck: normal jugular venous pulsations and no hepatojugular reflux; brisk carotid pulses without delay and no carotid bruits  Chest: clear to auscultation, no signs of consolidation by percussion or palpation, normal fremitus, symmetrical and full respiratory excursions; healthy left subclavian pacemaker site.  Cardiovascular: normal position and quality of the apical impulse, regular rhythm, normal first and second heart sounds, no murmurs, rubs or gallops  Abdomen: no tenderness or distention, no masses by palpation, no abnormal pulsatility or arterial bruits, normal bowel sounds, no hepatosplenomegaly  Extremities: no clubbing, cyanosis or edema; 2+ radial, ulnar and brachial pulses bilaterally; 2+ right femoral, posterior tibial and dorsalis pedis pulses; 2+ left femoral, posterior tibial and dorsalis pedis pulses; no subclavian or femoral bruits  Neurological: grossly nonfocal   EKG: Atrial paced ventricular sensed with diffuse nonspecific ST-T wave changes, QTC 433 ms  Lipid Panel      Component Value Date/Time   CHOL 181 04/12/2014 1043   TRIG 386* 04/12/2014 1043   HDL 33* 04/12/2014 1043   CHOLHDL 5.5 04/12/2014 1043   VLDL 77* 04/12/2014 1043   LDLCALC 71 04/12/2014 1043    BMET    Component Value Date/Time   NA 137 04/12/2014 1043   K 4.3 04/12/2014 1043   CL 101 04/12/2014 1043   CO2 25 04/12/2014 1043   GLUCOSE 117* 04/12/2014 1043   BUN 15 04/12/2014 1043   CREATININE 0.78 04/12/2014 1043   CREATININE 0.97 12/03/2012 0420   CALCIUM 9.2 04/12/2014 1043   GFRNONAA >90 12/03/2012 0420   GFRAA >90 12/03/2012 0420     ASSESSMENT AND PLAN  CAD (coronary artery disease) with CABG 2011 (LIMA to LAD, Free radial to Lt. PDA, VG to Diag and ramus intermedius), drug-eluting stent to left coronary artery and proximal left circumflex coronary artery January 2013  Asymptomatic. No ischemia or scar by 06/2011 nuclear scan. Normal EF by echo10/2012. Cath 09/2011 led to drug eluting Promus 4x16 mm stent to LMCA into proximal LCX. No symptoms since.   History of pacemaker  Normal device functiont. Still has substantial atrial arrhythmia (20-25% mode switch) despite ablation and dofetilide, but is really not symptomatic with it. Continue anticoagulation.   Paroxysmal atrial fibrillation  S/P repeat ablation. Fairly high burden of arrhythmia. QTC OK on Tikosyn. Continue warfarin. Reviewed risk of drug interactions with both these agents and couple of other medications.   Hypertension  Good control   Dyslipidemia  Fair TC and LDL-C on statin Rx, but TG are higher than ever and fasting glucose is borderline for a diagnosis of diabetes mellitus. I think he reallyneeds to lose weight and do better with his diet. We talked about this a lot today. He is on so many medications, I am loathe to add a fibrate.the solution is better diet and more physical activity.  Neurocardiogenic syncope No recent events  Orders Placed This Encounter  Procedures  . EKG 12-Lead   No orders of the  defined types were placed in this encounter.    Sorin Frimpong  Danella Philson,  MD, [facility] [phone] office [phone] pager

## 2014-07-05 NOTE — Patient Instructions (Signed)
Remote monitoring is used to monitor your Pacemaker or ICD from home. This monitoring reduces the number of office visits required to check your device to one time per year. It allows [place] to monitor the functioning of your device to ensure it is working properly. You are scheduled for a device check from home on October 06, 2014. You may send your transmission at any time that day. If you have a wireless device, the transmission will be sent automatically. After your physician reviews your transmission, you will receive a postcard with your next transmission date.  Dr. Sallyanne Kuster recommends that you schedule a follow-up appointment in: One year.

## 2014-07-06 ENCOUNTER — Other Ambulatory Visit: Payer: Self-pay | Admitting: Family Medicine

## 2014-07-06 DIAGNOSIS — I10 Essential (primary) hypertension: Secondary | ICD-10-CM

## 2014-07-07 NOTE — Telephone Encounter (Signed)
Dr Lorelei Pont, you saw pt in July for check up but don't see HTN addressed at [place]. Can we give RFs?

## 2014-07-08 ENCOUNTER — Encounter: Payer: Self-pay | Admitting: Cardiovascular Disease

## 2014-07-09 ENCOUNTER — Other Ambulatory Visit: Payer: Self-pay | Admitting: Cardiovascular Disease

## 2014-07-11 NOTE — Telephone Encounter (Signed)
Rx was sent to pharmacy electronically. 

## 2014-07-12 ENCOUNTER — Ambulatory Visit (INDEPENDENT_AMBULATORY_CARE_PROVIDER_SITE_OTHER): Payer: BC Managed Care – PPO | Admitting: *Deleted

## 2014-07-12 ENCOUNTER — Telehealth: Payer: Self-pay | Admitting: Cardiology

## 2014-07-12 DIAGNOSIS — I480 Paroxysmal atrial fibrillation: Secondary | ICD-10-CM

## 2014-07-12 LAB — MDC_IDC_ENUM_SESS_TYPE_REMOTE
Battery Voltage: 2.97 V
Brady Statistic AP VP Percent: 6.98 %
Brady Statistic AP VS Percent: 41.98 %
Brady Statistic AS VP Percent: 10.7 %
Brady Statistic AS VS Percent: 40.34 %
Brady Statistic RA Percent Paced: 48.96 %
Date Time Interrogation Session: 20151026192615
Lead Channel Impedance Value: 488 Ohm
Lead Channel Impedance Value: 560 Ohm
Lead Channel Sensing Intrinsic Amplitude: 20.0408
Lead Channel Sensing Intrinsic Amplitude: 4.928 mV
Lead Channel Setting Pacing Amplitude: 2 V
Lead Channel Setting Pacing Amplitude: 2.5 V
Lead Channel Setting Pacing Pulse Width: 0.4 ms
Lead Channel Setting Sensing Sensitivity: 0.9 mV
MDC IDC SET ZONE DETECTION INTERVAL: 400 ms
MDC IDC STAT BRADY RV PERCENT PACED: 17.68 %
Zone Setting Detection Interval: 350 ms

## 2014-07-12 NOTE — Telephone Encounter (Signed)
LMOVM reminding pt to send remote transmission.   

## 2014-07-12 NOTE — Progress Notes (Signed)
Remote pacemaker transmission.   

## 2014-07-13 ENCOUNTER — Telehealth: Payer: Self-pay

## 2014-07-13 MED ORDER — CLOPIDOGREL BISULFATE 75 MG PO TABS: 75.0000 mg | ORAL_TABLET | Freq: Every day | ORAL | Status: DC

## 2014-07-13 NOTE — Telephone Encounter (Signed)
Refill for plavix sent to the pharmacy.

## 2014-07-21 ENCOUNTER — Encounter: Payer: Self-pay | Admitting: *Deleted

## 2014-07-23 LAB — MDC_IDC_ENUM_SESS_TYPE_INCLINIC
Brady Statistic AP VP Percent: 5.1 %
Brady Statistic AP VS Percent: 55.9 %
Brady Statistic AS VP Percent: 11 %
Brady Statistic AS VS Percent: 27.9 %
Lead Channel Impedance Value: 496 Ohm
Lead Channel Impedance Value: 560 Ohm
Lead Channel Setting Pacing Amplitude: 2.5 V
Lead Channel Setting Sensing Sensitivity: 0.9 mV
MDC IDC MSMT BATTERY VOLTAGE: 2.99 V
MDC IDC MSMT LEADCHNL RA SENSING INTR AMPL: 1.2 mV
MDC IDC MSMT LEADCHNL RV SENSING INTR AMPL: 20 mV
MDC IDC SET LEADCHNL RA PACING AMPLITUDE: 2 V
MDC IDC SET LEADCHNL RV PACING PULSEWIDTH: 0.4 ms
MDC IDC SET ZONE DETECTION INTERVAL: 400 ms
Zone Setting Detection Interval: 350 ms

## 2014-07-26 ENCOUNTER — Ambulatory Visit (INDEPENDENT_AMBULATORY_CARE_PROVIDER_SITE_OTHER): Payer: BC Managed Care – PPO | Admitting: Pharmacist Clinician (PhC)/ Clinical Pharmacy Specialist

## 2014-07-26 DIAGNOSIS — I480 Paroxysmal atrial fibrillation: Secondary | ICD-10-CM

## 2014-07-26 DIAGNOSIS — Z7901 Long term (current) use of anticoagulants: Secondary | ICD-10-CM

## 2014-07-26 LAB — POCT INR: INR: 2.1

## 2014-07-27 ENCOUNTER — Other Ambulatory Visit: Payer: Self-pay | Admitting: Internal Medicine

## 2014-08-05 ENCOUNTER — Encounter: Payer: Self-pay | Admitting: Cardiology

## 2014-08-05 NOTE — Progress Notes (Signed)
Pacemaker interrogation for episodes only (N/C)---performed by [facility]. 4,350 AT/AF episodes (22.7%)---max dur. 10 hours + Warfarin. 14 Fast A&V episodes---max dur. 8 mins 48 sec, Max Avg V 207. 2 "VT" episodes---max dur. 1 sec, Max Avg V 174. Patient will follow up with JA as scheduled.

## 2014-08-16 ENCOUNTER — Encounter: Payer: Self-pay | Admitting: Internal Medicine

## 2014-08-20 ENCOUNTER — Encounter: Payer: Self-pay | Admitting: Cardiology

## 2014-08-23 ENCOUNTER — Ambulatory Visit (INDEPENDENT_AMBULATORY_CARE_PROVIDER_SITE_OTHER): Payer: BC Managed Care – PPO | Admitting: Pharmacist Clinician (PhC)/ Clinical Pharmacy Specialist

## 2014-08-23 DIAGNOSIS — Z7901 Long term (current) use of anticoagulants: Secondary | ICD-10-CM

## 2014-08-23 DIAGNOSIS — I480 Paroxysmal atrial fibrillation: Secondary | ICD-10-CM

## 2014-08-23 LAB — POCT INR: INR: 2

## 2014-08-26 ENCOUNTER — Encounter (HOSPITAL_COMMUNITY): Payer: Self-pay | Admitting: Cardiovascular Disease

## 2014-08-30 ENCOUNTER — Other Ambulatory Visit: Payer: Self-pay | Admitting: *Deleted

## 2014-08-30 MED ORDER — DOFETILIDE 500 MCG PO CAPS: 500.0000 ug | ORAL_CAPSULE | Freq: Two times a day (BID) | ORAL | Status: DC

## 2014-09-22 ENCOUNTER — Other Ambulatory Visit: Payer: Self-pay | Admitting: Internal Medicine

## 2014-09-23 NOTE — Telephone Encounter (Signed)
Rx(s) sent to pharmacy electronically.  

## 2014-10-04 ENCOUNTER — Ambulatory Visit (INDEPENDENT_AMBULATORY_CARE_PROVIDER_SITE_OTHER): Payer: BLUE CROSS/BLUE SHIELD | Admitting: Pharmacist Clinician (PhC)/ Clinical Pharmacy Specialist

## 2014-10-04 DIAGNOSIS — Z7901 Long term (current) use of anticoagulants: Secondary | ICD-10-CM

## 2014-10-04 DIAGNOSIS — I480 Paroxysmal atrial fibrillation: Secondary | ICD-10-CM

## 2014-10-04 LAB — POCT INR: INR: 1.9

## 2014-10-12 ENCOUNTER — Encounter: Payer: Self-pay | Admitting: Internal Medicine

## 2014-10-12 DIAGNOSIS — I480 Paroxysmal atrial fibrillation: Secondary | ICD-10-CM

## 2014-10-12 LAB — MDC_IDC_ENUM_SESS_TYPE_REMOTE
Battery Voltage: 2.97 V
Brady Statistic AP VP Percent: 5.69 %
Brady Statistic AP VS Percent: 53.62 %
Brady Statistic AS VS Percent: 26.93 %
Brady Statistic RA Percent Paced: 59.31 %
Date Time Interrogation Session: 20160126182142
Lead Channel Impedance Value: 536 Ohm
Lead Channel Impedance Value: 560 Ohm
Lead Channel Sensing Intrinsic Amplitude: 18.7616
Lead Channel Sensing Intrinsic Amplitude: 3.784 mV
Lead Channel Setting Pacing Amplitude: 2 V
Lead Channel Setting Sensing Sensitivity: 0.9 mV
MDC IDC SET LEADCHNL RV PACING AMPLITUDE: 2.5 V
MDC IDC SET LEADCHNL RV PACING PULSEWIDTH: 0.4 ms
MDC IDC STAT BRADY AS VP PERCENT: 13.75 %
MDC IDC STAT BRADY RV PERCENT PACED: 19.45 %
Zone Setting Detection Interval: 350 ms
Zone Setting Detection Interval: 400 ms

## 2014-10-13 ENCOUNTER — Ambulatory Visit (INDEPENDENT_AMBULATORY_CARE_PROVIDER_SITE_OTHER): Payer: BLUE CROSS/BLUE SHIELD | Admitting: *Deleted

## 2014-10-13 DIAGNOSIS — I480 Paroxysmal atrial fibrillation: Secondary | ICD-10-CM

## 2014-10-13 NOTE — Progress Notes (Signed)
Remote pacemaker transmission.   

## 2014-10-20 ENCOUNTER — Encounter: Payer: Self-pay | Admitting: Cardiology

## 2014-10-27 ENCOUNTER — Other Ambulatory Visit: Payer: Self-pay | Admitting: Family Medicine

## 2014-11-03 ENCOUNTER — Encounter: Payer: Self-pay | Admitting: Cardiology

## 2014-11-09 ENCOUNTER — Other Ambulatory Visit: Payer: Self-pay | Admitting: Family Medicine

## 2014-11-15 ENCOUNTER — Ambulatory Visit (INDEPENDENT_AMBULATORY_CARE_PROVIDER_SITE_OTHER): Payer: BLUE CROSS/BLUE SHIELD | Admitting: Pharmacist Clinician (PhC)/ Clinical Pharmacy Specialist

## 2014-11-15 DIAGNOSIS — I480 Paroxysmal atrial fibrillation: Secondary | ICD-10-CM

## 2014-11-15 DIAGNOSIS — Z7901 Long term (current) use of anticoagulants: Secondary | ICD-10-CM

## 2014-11-15 LAB — POCT INR: INR: 2

## 2014-11-23 ENCOUNTER — Other Ambulatory Visit: Payer: Self-pay | Admitting: Family Medicine

## 2014-12-04 ENCOUNTER — Other Ambulatory Visit: Payer: Self-pay | Admitting: Family Medicine

## 2014-12-09 ENCOUNTER — Other Ambulatory Visit: Payer: Self-pay | Admitting: Family Medicine

## 2014-12-10 ENCOUNTER — Other Ambulatory Visit: Payer: Self-pay | Admitting: Family Medicine

## 2014-12-10 NOTE — Telephone Encounter (Signed)
Dr Lorelei Pont, it looks like pt is overdue for f/up. I pended 1 mos w/note.

## 2014-12-13 ENCOUNTER — Other Ambulatory Visit: Payer: Self-pay | Admitting: Physician Assistant

## 2014-12-13 ENCOUNTER — Encounter: Payer: Self-pay | Admitting: Family Medicine

## 2014-12-13 ENCOUNTER — Ambulatory Visit (INDEPENDENT_AMBULATORY_CARE_PROVIDER_SITE_OTHER): Payer: BLUE CROSS/BLUE SHIELD | Admitting: Family Medicine

## 2014-12-13 VITALS — BP 150/82 | HR 66 | Temp 97.6°F | Resp 16 | Ht 69.5 in | Wt 228.0 lb

## 2014-12-13 DIAGNOSIS — Z5181 Encounter for therapeutic drug level monitoring: Secondary | ICD-10-CM | POA: Diagnosis not present

## 2014-12-13 DIAGNOSIS — F4323 Adjustment disorder with mixed anxiety and depressed mood: Secondary | ICD-10-CM | POA: Diagnosis not present

## 2014-12-13 DIAGNOSIS — Z23 Encounter for immunization: Secondary | ICD-10-CM

## 2014-12-13 DIAGNOSIS — E291 Testicular hypofunction: Secondary | ICD-10-CM

## 2014-12-13 DIAGNOSIS — F330 Major depressive disorder, recurrent, mild: Secondary | ICD-10-CM

## 2014-12-13 DIAGNOSIS — E785 Hyperlipidemia, unspecified: Secondary | ICD-10-CM

## 2014-12-13 DIAGNOSIS — I10 Essential (primary) hypertension: Secondary | ICD-10-CM

## 2014-12-13 LAB — CBC
HEMATOCRIT: 45.7 % (ref 39.0–52.0)
Hemoglobin: 15.6 g/dL (ref 13.0–17.0)
MCH: 29.1 pg (ref 26.0–34.0)
MCHC: 34.1 g/dL (ref 30.0–36.0)
MCV: 85.3 fL (ref 78.0–100.0)
MPV: 11.3 fL (ref 8.6–12.4)
PLATELETS: 181 10*3/uL (ref 150–400)
RBC: 5.36 MIL/uL (ref 4.22–5.81)
RDW: 14.8 % (ref 11.5–15.5)
WBC: 7 10*3/uL (ref 4.0–10.5)

## 2014-12-13 LAB — COMPREHENSIVE METABOLIC PANEL
ALK PHOS: 80 U/L (ref 39–117)
ALT: 24 U/L (ref 0–53)
AST: 21 U/L (ref 0–37)
Albumin: 4.5 g/dL (ref 3.5–5.2)
BILIRUBIN TOTAL: 0.7 mg/dL (ref 0.2–1.2)
BUN: 14 mg/dL (ref 6–23)
CHLORIDE: 103 meq/L (ref 96–112)
CO2: 26 mEq/L (ref 19–32)
CREATININE: 0.78 mg/dL (ref 0.50–1.35)
Calcium: 9.4 mg/dL (ref 8.4–10.5)
Glucose, Bld: 124 mg/dL — ABNORMAL HIGH (ref 70–99)
Potassium: 4.2 mEq/L (ref 3.5–5.3)
Sodium: 134 mEq/L — ABNORMAL LOW (ref 135–145)
Total Protein: 7.2 g/dL (ref 6.0–8.3)

## 2014-12-13 LAB — LIPID PANEL
Cholesterol: 218 mg/dL — ABNORMAL HIGH (ref 0–200)
HDL: 34 mg/dL — ABNORMAL LOW (ref >=40)
LDL CALC: 106 mg/dL — AB (ref 0–99)
Total CHOL/HDL Ratio: 6.4 Ratio
Triglycerides: 390 mg/dL — ABNORMAL HIGH (ref <150)
VLDL: 78 mg/dL — AB (ref 0–40)

## 2014-12-13 LAB — TESTOSTERONE: Testosterone: 102 ng/dL — ABNORMAL LOW (ref 300–890)

## 2014-12-13 MED ORDER — VENLAFAXINE HCL ER 150 MG PO CP24: ORAL_CAPSULE | ORAL | Status: DC

## 2014-12-13 MED ORDER — VENLAFAXINE HCL ER 75 MG PO CP24: ORAL_CAPSULE | ORAL | Status: DC

## 2014-12-13 MED ORDER — CLONAZEPAM 0.5 MG PO TABS
0.5000 mg | ORAL_TABLET | Freq: Two times a day (BID) | ORAL | Status: DC | PRN
Start: 2014-12-13 — End: 2020-12-27

## 2014-12-13 MED ORDER — TESTOSTERONE 30 MG/ACT TD SOLN: TRANSDERMAL | Status: DC

## 2014-12-13 MED ORDER — ATENOLOL 100 MG PO TABS: ORAL_TABLET | ORAL | Status: DC

## 2014-12-13 MED ORDER — LISINOPRIL 20 MG PO TABS: 20.0000 mg | ORAL_TABLET | Freq: Every day | ORAL | Status: DC

## 2014-12-13 NOTE — Patient Instructions (Signed)
Great to see you today- I will be in touch with your labs asap

## 2014-12-13 NOTE — Progress Notes (Signed)
[facility] [address], [place] [address] [phone]  Date:  12/13/2014   Name:  Lucas Porter   DOB:  [birth year]   MRN:  [id number]  PCP:  Kennon Portela, MD    Chief Complaint: Medication Refill   History of Present Illness:  Lucas Porter is a 59 y.o. very pleasant male patient who presents with the following:  He is here today for refills of his medications Ran out of his testosterone about 4 days ago.  He feels that he is doing ok with axiron although he liked the androgel better. The androgel was not covered by his insurance however so he will make do His anticoagulation is followed at the coumadin clinic   Last PSA was in July He uses klonopin just as needed- he rarely needs this. Has not filled in at least a year, but is now out He is fasting today He feels that his effexor is controlling his depression sx well and he would like to continue to use this   Patient Active Problem List   Diagnosis Date Noted  . 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 Symptomatic bradycardia 03/09/2013  . Long term (current) use of anticoagulants 12/03/2012  . BMI 40.0-44.9, adult 05/13/2012  . Atrial flutter 04/24/2012  . S/P coronary artery stent placement to distal Lt. main and Lt. Cx, 10/17/11 10/19/2011  . Angina pectoris 10/19/2011  . Coagulopathy 10/19/2011  . History of pacemaker, with a-v pacing 10/19/2011  . CAD (coronary artery disease) with CABG 2011, LIMA to LAD, Free radial to Lt. PDA, VG to Diag and ramus intermedius 10/02/2011  . Anxiety 10/02/2011  . Paroxysmal atrial fibrillation 10/02/2011  . Hypertension 10/02/2011  . Dyslipidemia 10/02/2011  . 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 Coronary artery disease     s/p CABG 2010, s/p Successful PCI  10/17/11 of Distal Left Main and the Left Circumflex with a Promus Element DES stent -- 4.0 mm x 16 mm postdilated to 4.12 mm   . Hypertension   .  Anxiety   . DDD (degenerative disc disease)   . Atrial fibrillation     paroxysmal  . S/P coronary artery stent placement to distal Lt. main and Lt. Cx 10/17/11  . 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 Coagulopathy     persistent elevation of ACT during 10/17/11 hospitalization  . Atrial flutter   . Hyperlipidemia   . Heart murmur   . Kidney stones     Past Surgical History  Procedure Laterality Date  . Coronary angioplasty  10/19/11  . Coronary artery bypass graft  2010  . Insert / replace / remove pacemaker  2/12    Medtronic  . Tee without cardioversion  04/23/2012    Procedure: TRANSESOPHAGEAL ECHOCARDIOGRAM (TEE);  Surgeon: Josue Hector, MD;  Location: [facility] ENDOSCOPY;  Service: Cardiovascular;  Laterality: N/A;  spoke to pt about time change from 11a to 12p/DL  . Atrial fibrillation ablation  04/24/12, 12/02/12    PVI x2 and CTI ablation by Dr Rayann Heman  . Tee without cardioversion N/A 12/01/2012    Procedure: TRANSESOPHAGEAL ECHOCARDIOGRAM (TEE);  Surgeon: [name], MD;  Location: [facility] ENDOSCOPY;  Service: Cardiovascular;  Laterality: N/A;  . Nm myocar perf wall motion  03/23/2011    Low risk  . Left heart catheterization with coronary/graft angiogram N/A 10/17/2011    Procedure: LEFT HEART CATHETERIZATION  WITH Beatrix Fetters;  Surgeon: [name], MD;  Location: [facility] CATH LAB;  Service: Cardiovascular;  Laterality: N/A;  . Percutaneous coronary stent intervention (pci-s)  10/17/2011    Procedure: PERCUTANEOUS CORONARY STENT INTERVENTION (PCI-S);  Surgeon: [name], MD;  Location: [facility] CATH LAB;  Service: Cardiovascular;;  . Left heart catheterization with coronary/graft angiogram N/A 02/08/2012    Procedure: LEFT HEART CATHETERIZATION WITH Beatrix Fetters;  Surgeon: [name], MD;  Location: [facility] CATH LAB;  Service: Cardiovascular;  Laterality: N/A;  . Atrial fibrillation ablation N/A 04/24/2012    Procedure: ATRIAL FIBRILLATION  ABLATION;  Surgeon: Thompson Grayer, MD;  Location: [facility] CATH LAB;  Service: Cardiovascular;  Laterality: N/A;  . Atrial fibrillation ablation N/A 12/02/2012    Procedure: ATRIAL FIBRILLATION ABLATION;  Surgeon: Thompson Grayer, MD;  Location: [facility] CATH LAB;  Service: Cardiovascular;  Laterality: N/A;    History  Substance Use Topics  . Smoking status: Former Smoker -- 1 years    Types: Cigarettes    Quit date: 12/18/1963  . Smokeless tobacco: Former Systems developer    Quit date: 10/03/2012     Comment: quit smoking in 1970's  . Alcohol Use: 0.6 oz/week    1 Cans of beer per week     Comment: rare    Family History  Problem Relation Age of Onset  . Coronary artery disease    . Stroke Brother   . Cancer Mother     No Known Allergies  Medication list has been reviewed and updated.  Current Outpatient Prescriptions on File Prior to Visit  Medication Sig Dispense Refill  . atenolol (TENORMIN) 50 MG tablet TAKE TWO TABLETS BY MOUTH ONCE DAILY.  ""OV NEEDED FOR ADDITIONAL REFILLS" 60 tablet 0  . clonazePAM (KLONOPIN) 0.5 MG tablet Take 1 tablet (0.5 mg total) by mouth 2 (two) times daily as needed for anxiety. 30 tablet 1  . clopidogrel (PLAVIX) 75 MG tablet Take 1 tablet (75 mg total) by mouth daily. 90 tablet 3  . diltiazem (DILACOR XR) 180 MG 24 hr capsule TAKE ONE CAPSULE BY MOUTH ONCE DAILY 90 capsule 2  . dofetilide (TIKOSYN) 500 MCG capsule Take 1 capsule (500 mcg total) by mouth 2 (two) times daily. 180 capsule 3  . fish oil-omega-3 fatty acids 1000 MG capsule Take 2 g by mouth 2 (two) times daily.    Marland Kitchen KLOR-CON M10 10 MEQ tablet TAKE ONE TABLET BY MOUTH ONCE DAILY 30 tablet 11  . lisinopril (PRINIVIL,ZESTRIL) 20 MG tablet Take 1 tablet (20 mg total) by mouth daily. NO MORE REFILLS WITHOUT OFFICE VISIT - 2ND NOTICE\ 15 tablet 0  . nitroGLYCERIN (NITROSTAT) 0.4 MG SL tablet Place 1 tablet (0.4 mg total) under the tongue every 5 (five) minutes as needed for chest pain. 25 tablet 3  . pravastatin  (PRAVACHOL) 40 MG tablet Take 1 tablet (40 mg total) by mouth daily. 30 tablet 9  . Testosterone (AXIRON) 30 MG/ACT SOLN Apply 30 mg to each axilla topically once daily in the morning. PATIENT NEEDS OFFICE VISIT FOR ADDITIONAL REFILLS 90 mL 0  . venlafaxine XR (EFFEXOR-XR) 150 MG 24 hr capsule Take 1 capsule by mouth daily with 75 mg to equal total of 225 mg daily. NO MORE REFILLS WITHOUT OFFICE VISIT - 2ND NOTICE 15 capsule 0  . venlafaxine XR (EFFEXOR-XR) 150 MG 24 hr capsule TAKE ONE CAPSULE BY MOUTH ONCE DAILY WITH 75 MG TO EQUAL A TOTAL OF 225 MG DAILY 30 capsule 0  . venlafaxine XR (EFFEXOR-XR)  75 MG 24 hr capsule TAKE ONE CAPSULE BY MOUTH ONCE DAILY TAKE  WITH  150  MG  TO  EQUAL  A  TOTAL  OF  225  MG  DAILY 30 capsule 0  . warfarin (COUMADIN) 5 MG tablet Take 1 tablet by mouth daily or as directed by coumadin clinic 90 tablet 1  . pantoprazole (PROTONIX) 40 MG tablet Take 1 tablet (40 mg total) by mouth daily. (Patient not taking: Reported on 12/13/2014) 30 tablet 5   No current facility-administered medications on file prior to visit.    Review of Systems:  As per HPI- otherwise negative.   Physical Examination: Filed Vitals:   12/13/14 1018  BP: 150/82  Pulse: 66  Temp: 97.6 F (36.4 C)  Resp: 16   Filed Vitals:   12/13/14 1018  Height: 5' 9.5" (1.765 m)  Weight: 228 lb (103.42 kg)   Body mass index is 33.2 kg/(m^2). Ideal Body Weight: Weight in (lb) to have BMI = 25: 171.4  GEN: WDWN, NAD, Non-toxic, A & O x 3, overweight, looks well HEENT: Atraumatic, Normocephalic. Neck supple. No masses, No LAD. Ears and Nose: No external deformity. CV: RRR, No M/G/R. No JVD. No thrill. No extra heart sounds. PULM: CTA B, no wheezes, crackles, rhonchi. No retractions. No resp. distress. No accessory muscle use. EXTR: No c/c/e NEURO Normal gait.  PSYCH: Normally interactive. Conversant. Not depressed or anxious appearing.  Calm demeanor.   Assessment and Plan: Need for  prophylactic vaccination and inoculation against influenza - Plan: Flu Vaccine QUAD 36+ mos IM  Major depressive disorder, recurrent episode, mild - Plan: venlafaxine XR (EFFEXOR-XR) 150 MG 24 hr capsule, venlafaxine XR (EFFEXOR-XR) 75 MG 24 hr capsule  Hypogonadism in male - Plan: Testosterone (AXIRON) 30 MG/ACT SOLN  Adjustment disorder with mixed anxiety and depressed mood - Plan: clonazePAM (KLONOPIN) 0.5 MG tablet  Essential hypertension - Plan: lisinopril (PRINIVIL,ZESTRIL) 20 MG tablet, atenolol (TENORMIN) 100 MG tablet, DISCONTINUED: atenolol (TENORMIN) 100 MG tablet  Medication monitoring encounter - Plan: CBC, Comprehensive metabolic panel, Lipid panel, PSA, Testosterone  Refilled medications and labs as above. He has been out of his T for a few days so this may alter results

## 2014-12-14 LAB — PSA: PSA: 1.44 ng/mL (ref <=4.00)

## 2014-12-16 MED ORDER — PRAVASTATIN SODIUM 80 MG PO TABS: 80.0000 mg | ORAL_TABLET | Freq: Every day | ORAL | Status: DC

## 2014-12-16 NOTE — Addendum Note (Signed)
Addended by: Lamar Blinks C on: 12/16/2014 03:32 PM   Modules accepted: Orders

## 2014-12-27 ENCOUNTER — Telehealth: Payer: Self-pay

## 2014-12-27 ENCOUNTER — Ambulatory Visit (INDEPENDENT_AMBULATORY_CARE_PROVIDER_SITE_OTHER): Payer: BLUE CROSS/BLUE SHIELD | Admitting: Pharmacist Clinician (PhC)/ Clinical Pharmacy Specialist

## 2014-12-27 DIAGNOSIS — Z7901 Long term (current) use of anticoagulants: Secondary | ICD-10-CM

## 2014-12-27 DIAGNOSIS — I480 Paroxysmal atrial fibrillation: Secondary | ICD-10-CM

## 2014-12-27 LAB — POCT INR: INR: 2

## 2014-12-27 NOTE — Telephone Encounter (Signed)
Patients medication went from 40mg  to 80 mg and patient was not aware of any change in dosage.  Please advise.  Dr. Lorelei Pont pt.   [phone]

## 2014-12-27 NOTE — Telephone Encounter (Signed)
Error

## 2014-12-27 NOTE — Telephone Encounter (Signed)
Pt had never gotten my chart lab message. Gave his wife results, answers as to why we were increasing pravastatin.

## 2015-01-17 ENCOUNTER — Encounter: Payer: BLUE CROSS/BLUE SHIELD | Admitting: Internal Medicine

## 2015-02-03 ENCOUNTER — Ambulatory Visit (INDEPENDENT_AMBULATORY_CARE_PROVIDER_SITE_OTHER): Payer: BLUE CROSS/BLUE SHIELD | Admitting: Pharmacist Clinician (PhC)/ Clinical Pharmacy Specialist

## 2015-02-03 DIAGNOSIS — I480 Paroxysmal atrial fibrillation: Secondary | ICD-10-CM

## 2015-02-03 DIAGNOSIS — Z7901 Long term (current) use of anticoagulants: Secondary | ICD-10-CM | POA: Diagnosis not present

## 2015-02-03 LAB — POCT INR: INR: 1.7

## 2015-02-24 ENCOUNTER — Ambulatory Visit: Payer: BLUE CROSS/BLUE SHIELD | Admitting: Pharmacist Clinician (PhC)/ Clinical Pharmacy Specialist

## 2015-03-01 ENCOUNTER — Other Ambulatory Visit: Payer: Self-pay | Admitting: *Deleted

## 2015-03-01 MED ORDER — DOFETILIDE 500 MCG PO CAPS: 500.0000 ug | ORAL_CAPSULE | Freq: Two times a day (BID) | ORAL | Status: DC

## 2015-04-25 ENCOUNTER — Ambulatory Visit (INDEPENDENT_AMBULATORY_CARE_PROVIDER_SITE_OTHER): Payer: BLUE CROSS/BLUE SHIELD | Admitting: Pharmacist Clinician (PhC)/ Clinical Pharmacy Specialist

## 2015-04-25 ENCOUNTER — Other Ambulatory Visit: Payer: Self-pay | Admitting: Cardiovascular Disease

## 2015-04-25 DIAGNOSIS — Z7901 Long term (current) use of anticoagulants: Secondary | ICD-10-CM | POA: Diagnosis not present

## 2015-04-25 DIAGNOSIS — I480 Paroxysmal atrial fibrillation: Secondary | ICD-10-CM

## 2015-04-25 LAB — POCT INR: INR: 2.2

## 2015-04-26 NOTE — Telephone Encounter (Signed)
Rx(s) sent to pharmacy electronically.  

## 2015-04-28 ENCOUNTER — Ambulatory Visit (INDEPENDENT_AMBULATORY_CARE_PROVIDER_SITE_OTHER): Payer: BLUE CROSS/BLUE SHIELD

## 2015-04-28 ENCOUNTER — Ambulatory Visit (INDEPENDENT_AMBULATORY_CARE_PROVIDER_SITE_OTHER): Payer: BLUE CROSS/BLUE SHIELD | Admitting: Family Medicine

## 2015-04-28 VITALS — BP 120/82 | HR 72 | Temp 98.0°F | Resp 15 | Ht 69.5 in | Wt 231.0 lb

## 2015-04-28 DIAGNOSIS — R0781 Pleurodynia: Secondary | ICD-10-CM

## 2015-04-28 DIAGNOSIS — R0602 Shortness of breath: Secondary | ICD-10-CM | POA: Diagnosis not present

## 2015-04-28 DIAGNOSIS — T148 Other injury of unspecified body region: Secondary | ICD-10-CM | POA: Diagnosis not present

## 2015-04-28 DIAGNOSIS — Z7901 Long term (current) use of anticoagulants: Secondary | ICD-10-CM | POA: Diagnosis not present

## 2015-04-28 DIAGNOSIS — T148XXA Other injury of unspecified body region, initial encounter: Secondary | ICD-10-CM

## 2015-04-28 MED ORDER — HYDROCODONE-ACETAMINOPHEN 5-325 MG PO TABS: 1.0000 | ORAL_TABLET | Freq: Four times a day (QID) | ORAL | Status: DC | PRN

## 2015-04-28 MED ORDER — BACLOFEN 10 MG PO TABS: ORAL_TABLET | ORAL | Status: DC

## 2015-04-28 NOTE — Patient Instructions (Signed)
Chest Contusion °A chest contusion is a deep bruise on your chest area. Contusions are the result of an injury that caused bleeding under the skin. A chest contusion may involve bruising of the skin, muscles, or ribs. The contusion may turn blue, purple, or yellow. Minor injuries will give you a painless contusion, but more severe contusions may stay painful and swollen for a few weeks. °CAUSES  °A contusion is usually caused by a blow, trauma, or direct force to an area of the body. °SYMPTOMS  °· Swelling and redness of the injured area. °· Discoloration of the injured area. °· Tenderness and soreness of the injured area. °· Pain. °DIAGNOSIS  °The diagnosis can be made by taking a history and performing a physical exam. An X-ray, CT scan, or MRI may be needed to determine if there were any associated injuries, such as broken bones (fractures) or internal injuries. °TREATMENT  °Often, the best treatment for a chest contusion is resting, icing, and applying cold compresses to the injured area. Deep breathing exercises may be recommended to reduce the risk of pneumonia. Over-the-counter medicines may also be recommended for pain control. °HOME CARE INSTRUCTIONS  °· Put ice on the injured area. °¨ Put ice in a plastic bag. °¨ Place a towel between your skin and the bag. °¨ Leave the ice on for 15-20 minutes, 03-04 times a day. °· Only take over-the-counter or prescription medicines as directed by your caregiver. Your caregiver may recommend avoiding anti-inflammatory medicines (aspirin, ibuprofen, and naproxen) for 48 hours because these medicines may increase bruising. °· Rest the injured area. °· Perform deep-breathing exercises as directed by your caregiver. °· Stop smoking if you smoke. °· Do not lift objects over 5 pounds (2.3 kg) for 3 days or longer if recommended by your caregiver. °SEEK IMMEDIATE MEDICAL CARE IF:  °· You have increased bruising or swelling. °· You have pain that is getting worse. °· You have  difficulty breathing. °· You have dizziness, weakness, or fainting. °· You have blood in your urine or stool. °· You cough up or vomit blood. °· Your swelling or pain is not relieved with medicines. °MAKE SURE YOU:  °· Understand these instructions. °· Will watch your condition. °· Will get help right away if you are not doing well or get worse. °Document Released: 05/29/2001 Document Revised: 05/28/2012 Document Reviewed: 02/25/2012 °[employer]® Patient Information ©2015 [employer]. This information is not intended to replace advice given to you by your health care provider. Make sure you discuss any questions you have with your health care provider. ° °

## 2015-04-28 NOTE — Progress Notes (Signed)
Chief Complaint:  Chief Complaint  Patient presents with  . Rib Injury    Encounter with llama yesterday evening    HPI: Lucas Porter is a 59 y.o. male who reports to [facility] today complaining of left sided rib pain since last night after pet [place] him with his knees. He has had 5 lllamas . Thsi one was slightly more aggressive, hurts to take deep breaths and move, he has no wheezing or palpiatations, he has no burising, he has no blood in his urine.   Past Medical History  Diagnosis Date  . Coronary artery disease     s/p CABG 2010, s/p Successful PCI  10/17/11 of Distal Left Main and the Left Circumflex with a Promus Element DES stent -- 4.0 mm x 16 mm postdilated to 4.12 mm   . Hypertension   . Anxiety   . DDD (degenerative disc disease)   . Atrial fibrillation     paroxysmal  . S/P coronary artery stent placement to distal Lt. main and Lt. Cx 10/17/11  . 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 Coagulopathy     persistent elevation of ACT during 10/17/11 hospitalization  . Atrial flutter   . Hyperlipidemia   . Heart murmur   . Kidney stones    Past Surgical History  Procedure Laterality Date  . Coronary angioplasty  10/19/11  . Coronary artery bypass graft  2010  . Insert / replace / remove pacemaker  2/12    [employer]tronic  . Tee without cardioversion  04/23/2012    Procedure: TRANSESOPHAGEAL ECHOCARDIOGRAM (TEE);  Surgeon: Josue Hector, MD;  Location: [facility] ENDOSCOPY;  Service: Cardiovascular;  Laterality: N/A;  spoke to pt about time change from 11a to 12p/DL  . Atrial fibrillation ablation  04/24/12, 12/02/12    PVI x2 and CTI ablation by Dr Rayann Heman  . Tee without cardioversion N/A 12/01/2012    Procedure: TRANSESOPHAGEAL ECHOCARDIOGRAM (TEE);  Surgeon: [name], MD;  Location: [facility] ENDOSCOPY;  Service: Cardiovascular;  Laterality: N/A;  . Nm myocar perf wall motion  03/23/2011    Low risk  . Left heart catheterization with  coronary/graft angiogram N/A 10/17/2011    Procedure: LEFT HEART CATHETERIZATION WITH Beatrix Fetters;  Surgeon: [name], MD;  Location: [place] CATH LAB;  Service: Cardiovascular;  Laterality: N/A;  . Percutaneous coronary stent intervention (pci-s)  10/17/2011    Procedure: PERCUTANEOUS CORONARY STENT INTERVENTION (PCI-S);  Surgeon: [name], MD;  Location: [facility] CATH LAB;  Service: Cardiovascular;;  . Left heart catheterization with coronary/graft angiogram N/A 02/08/2012    Procedure: LEFT HEART CATHETERIZATION WITH Beatrix Fetters;  Surgeon: [name], MD;  Location: [place] CATH LAB;  Service: Cardiovascular;  Laterality: N/A;  . Atrial fibrillation ablation N/A 04/24/2012    Procedure: ATRIAL FIBRILLATION ABLATION;  Surgeon: Thompson Grayer, MD;  Location: [facility] CATH LAB;  Service: Cardiovascular;  Laterality: N/A;  . Atrial fibrillation ablation N/A 12/02/2012    Procedure: ATRIAL FIBRILLATION ABLATION;  Surgeon: Thompson Grayer, MD;  Location: [facility] CATH LAB;  Service: Cardiovascular;  Laterality: N/A;   Social History   Social History  . Marital Status: Married    Spouse Name: N/A  . Number of Children: N/A  . Years of Education: N/A   Social History Main Topics  . Smoking status: Former Smoker -- 1 years    Types: Cigarettes    Quit date: 12/18/1963  . Smokeless tobacco: Former Systems developer  Quit date: 10/03/2012     Comment: quit smoking in 1970's  . Alcohol Use: 0.6 oz/week    1 Cans of beer per week     Comment: rare  . Drug Use: No  . Sexual Activity: Yes   Other Topics Concern  . None   Social History Narrative   Pt lives in [place] with spouse.  2 grown daughters.  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   Family History  Problem Relation Age of Onset  . Coronary artery disease    . Stroke Brother   . Cancer Mother    No Known Allergies Prior to Admission medications   Medication Sig Start Date End Date Taking? Authorizing Provider  atenolol (TENORMIN) 100  MG tablet Take 1 tablet by mouth daily 12/13/14  Yes Gay Filler Copland, MD  clonazePAM (KLONOPIN) 0.5 MG tablet Take 1 tablet (0.5 mg total) by mouth 2 (two) times daily as needed for anxiety. 12/13/14  Yes Gay Filler Copland, MD  clopidogrel (PLAVIX) 75 MG tablet Take 1 tablet (75 mg total) by mouth daily. 07/13/14  Yes Mihai Croitoru, MD  diltiazem (DILACOR XR) 180 MG 24 hr capsule TAKE ONE CAPSULE BY MOUTH ONCE DAILY 04/26/15  Yes Mihai Croitoru, MD  dofetilide (TIKOSYN) 500 MCG capsule Take 1 capsule (500 mcg total) by mouth 2 (two) times daily. 03/01/15  Yes Mihai Croitoru, MD  fish oil-omega-3 fatty acids 1000 MG capsule Take 2 g by mouth 2 (two) times daily.   Yes Historical Provider, MD  KLOR-CON M10 10 MEQ tablet TAKE ONE TABLET BY MOUTH ONCE DAILY 07/11/14  Yes Mihai Croitoru, MD  lisinopril (PRINIVIL,ZESTRIL) 20 MG tablet Take 1 tablet (20 mg total) by mouth daily. 12/13/14  Yes Gay Filler Copland, MD  nitroGLYCERIN (NITROSTAT) 0.4 MG SL tablet Place 1 tablet (0.4 mg total) under the tongue every 5 (five) minutes as needed for chest pain. 05/01/13  Yes Thompson Grayer, MD  pravastatin (PRAVACHOL) 80 MG tablet Take 1 tablet (80 mg total) by mouth daily. 12/16/14  Yes Darreld Mclean, MD  Testosterone (AXIRON) 30 MG/ACT SOLN Apply 30 mg to each axilla topically once daily in the morning. 12/13/14  Yes Gay Filler Copland, MD  venlafaxine XR (EFFEXOR-XR) 150 MG 24 hr capsule Take 1 capsule by mouth daily with 75 mg to equal total of 225 mg daily. 12/13/14  Yes Gay Filler Copland, MD  venlafaxine XR (EFFEXOR-XR) 75 MG 24 hr capsule TAKE ONE CAPSULE BY MOUTH ONCE DAILY TAKE  WITH  150  MG  TO  EQUAL  A  TOTAL  OF  225  MG  DAILY 12/13/14  Yes Gay Filler Copland, MD  warfarin (COUMADIN) 5 MG tablet Take 1 tablet by mouth daily or as directed by coumadin clinic 06/22/14  Yes Kristin L Alvstad, RPH-CPP  pantoprazole (PROTONIX) 40 MG tablet Take 1 tablet (40 mg total) by mouth daily. Patient not taking: Reported on  12/13/2014 06/19/13   Thompson Grayer, MD     ROS: The patient denies fevers, chills, night sweats, unintentional weight loss, chest pain, palpitations, wheezing, dyspnea on exertion, nausea, vomiting, abdominal pain, dysuria, hematuria, melena, numbness, weakness, or tingling.   All other systems have been reviewed and were otherwise negative with the exception of those mentioned in the HPI and as above.    PHYSICAL EXAM: Filed Vitals:   04/28/15 0912  BP: 120/82  Pulse: 72  Temp: 98 F (36.7 C)  Resp: 15   Body mass index is 33.64  kg/(m^2).   General: Alert, no acute distress HEENT:  Normocephalic, atraumatic, oropharynx patent. EOMI, PERRLA Cardiovascular:  Regular rate and rhythm, no rubs murmurs or gallops.  No Carotid bruits, radial pulse intact. No pedal edema. + tender left sided chest wall, lpower ribs near spleen Respiratory: Clear to auscultation bilaterally.  No wheezes, rales, or rhonchi.  No cyanosis, no use of accessory musculature Abdominal: No organomegaly, abdomen is soft and non-tender, positive bowel sounds. No masses. Skin: No rashes. Neurologic: Facial musculature symmetric. Psychiatric: Patient acts appropriately throughout our interaction. Lymphatic: No cervical or submandibular lymphadenopathy Musculoskeletal: Gait intact. No edema, tenderness   LABS: Results for orders placed or performed in visit on 04/25/15  POCT INR  Result Value Ref Range   INR 2.2      EKG/XRAY:   Primary read interpreted by Dr. Marin Comment at [facility]. Neg acute cardiopulm process Unable to visualize any rib fracture No free air   ASSESSMENT/PLAN: Encounter Diagnoses  Name Primary?  . Rib pain on left side Yes  . SOB (shortness of breath)   . Contusion   . Chronic anticoagulation    Chest wall contusion, take deep breaths Rx norco and baclofen Fu prn   Gross sideeffects, risk and benefits, and alternatives of medications d/w patient. Patient is aware that all medications have  potential sideeffects and we are unable to predict every sideeffect or drug-drug interaction that may occur.  Mylen Mangan DO  04/28/2015 11:18 AM

## 2015-05-15 ENCOUNTER — Other Ambulatory Visit: Payer: Self-pay | Admitting: Pharmacist Clinician (PhC)/ Clinical Pharmacy Specialist

## 2015-05-25 ENCOUNTER — Ambulatory Visit (INDEPENDENT_AMBULATORY_CARE_PROVIDER_SITE_OTHER): Payer: BLUE CROSS/BLUE SHIELD | Admitting: Pharmacist Clinician (PhC)/ Clinical Pharmacy Specialist

## 2015-05-25 DIAGNOSIS — Z7901 Long term (current) use of anticoagulants: Secondary | ICD-10-CM

## 2015-05-25 DIAGNOSIS — I480 Paroxysmal atrial fibrillation: Secondary | ICD-10-CM

## 2015-05-25 LAB — POCT INR: INR: 1.6

## 2015-06-01 ENCOUNTER — Encounter: Payer: Self-pay | Admitting: Internal Medicine

## 2015-06-01 ENCOUNTER — Ambulatory Visit (INDEPENDENT_AMBULATORY_CARE_PROVIDER_SITE_OTHER): Payer: BLUE CROSS/BLUE SHIELD | Admitting: *Deleted

## 2015-06-01 DIAGNOSIS — I480 Paroxysmal atrial fibrillation: Secondary | ICD-10-CM | POA: Diagnosis not present

## 2015-06-01 LAB — CUP PACEART REMOTE DEVICE CHECK
Battery Voltage: 2.96 V
Brady Statistic AS VP Percent: 16.67 %
Brady Statistic RA Percent Paced: 50.63 %
Brady Statistic RV Percent Paced: 20.06 %
Lead Channel Impedance Value: 480 Ohm
Lead Channel Impedance Value: 584 Ohm
Lead Channel Setting Pacing Amplitude: 2 V
Lead Channel Setting Pacing Pulse Width: 0.4 ms
Lead Channel Setting Sensing Sensitivity: 0.9 mV
MDC IDC MSMT LEADCHNL RA SENSING INTR AMPL: 1.892 mV
MDC IDC MSMT LEADCHNL RV SENSING INTR AMPL: 20.041 mV
MDC IDC SESS DTM: 20160914182706
MDC IDC SET LEADCHNL RV PACING AMPLITUDE: 2.5 V
MDC IDC STAT BRADY AP VP PERCENT: 3.39 %
MDC IDC STAT BRADY AP VS PERCENT: 47.24 %
MDC IDC STAT BRADY AS VS PERCENT: 32.7 %
Zone Setting Detection Interval: 350 ms
Zone Setting Detection Interval: 400 ms

## 2015-06-02 NOTE — Progress Notes (Signed)
Remote pacemaker transmission.   

## 2015-06-08 ENCOUNTER — Ambulatory Visit (INDEPENDENT_AMBULATORY_CARE_PROVIDER_SITE_OTHER): Payer: BLUE CROSS/BLUE SHIELD | Admitting: Pharmacist Clinician (PhC)/ Clinical Pharmacy Specialist

## 2015-06-08 DIAGNOSIS — Z7901 Long term (current) use of anticoagulants: Secondary | ICD-10-CM | POA: Diagnosis not present

## 2015-06-08 DIAGNOSIS — I480 Paroxysmal atrial fibrillation: Secondary | ICD-10-CM

## 2015-06-08 LAB — POCT INR: INR: 2.4

## 2015-06-09 ENCOUNTER — Telehealth: Payer: Self-pay | Admitting: *Deleted

## 2015-06-09 NOTE — Telephone Encounter (Signed)
LMTCB/sss  Patient w/ c/o syncopal episode on 06/01/15----AF episodes noted on remote. Max Avg V rate 82 bpm. Freeze capture ApVs. Dr.Croitoru aware.

## 2015-06-16 ENCOUNTER — Other Ambulatory Visit: Payer: Self-pay | Admitting: Family Medicine

## 2015-06-17 ENCOUNTER — Encounter: Payer: Self-pay | Admitting: Cardiology

## 2015-06-21 ENCOUNTER — Telehealth: Payer: Self-pay

## 2015-06-21 NOTE — Telephone Encounter (Signed)
PA completed for Axiron on covermymeds. Approved from 05/22/15 - 06/20/16, case #[id number]. Notified pharm.

## 2015-07-04 ENCOUNTER — Encounter: Payer: Self-pay | Admitting: Cardiology

## 2015-07-08 ENCOUNTER — Ambulatory Visit (INDEPENDENT_AMBULATORY_CARE_PROVIDER_SITE_OTHER): Payer: BLUE CROSS/BLUE SHIELD | Admitting: Pharmacist Clinician (PhC)/ Clinical Pharmacy Specialist

## 2015-07-08 DIAGNOSIS — Z7901 Long term (current) use of anticoagulants: Secondary | ICD-10-CM | POA: Diagnosis not present

## 2015-07-08 DIAGNOSIS — I480 Paroxysmal atrial fibrillation: Secondary | ICD-10-CM | POA: Diagnosis not present

## 2015-07-08 LAB — POCT INR: INR: 2.1

## 2015-07-29 ENCOUNTER — Ambulatory Visit (INDEPENDENT_AMBULATORY_CARE_PROVIDER_SITE_OTHER): Payer: BLUE CROSS/BLUE SHIELD | Admitting: Cardiovascular Disease

## 2015-07-29 ENCOUNTER — Encounter: Payer: Self-pay | Admitting: Cardiovascular Disease

## 2015-07-29 VITALS — BP 136/88 | HR 65 | Ht 70.0 in | Wt 237.2 lb

## 2015-07-29 DIAGNOSIS — I4892 Unspecified atrial flutter: Secondary | ICD-10-CM

## 2015-07-29 DIAGNOSIS — R001 Bradycardia, unspecified: Secondary | ICD-10-CM | POA: Diagnosis not present

## 2015-07-29 DIAGNOSIS — Z8679 Personal history of other diseases of the circulatory system: Secondary | ICD-10-CM

## 2015-07-29 DIAGNOSIS — E785 Hyperlipidemia, unspecified: Secondary | ICD-10-CM

## 2015-07-29 DIAGNOSIS — Z79899 Other long term (current) drug therapy: Secondary | ICD-10-CM | POA: Diagnosis not present

## 2015-07-29 DIAGNOSIS — Z955 Presence of coronary angioplasty implant and graft: Secondary | ICD-10-CM

## 2015-07-29 DIAGNOSIS — I10 Essential (primary) hypertension: Secondary | ICD-10-CM

## 2015-07-29 DIAGNOSIS — R7309 Other abnormal glucose: Secondary | ICD-10-CM

## 2015-07-29 DIAGNOSIS — I480 Paroxysmal atrial fibrillation: Secondary | ICD-10-CM

## 2015-07-29 DIAGNOSIS — R55 Syncope and collapse: Secondary | ICD-10-CM | POA: Diagnosis not present

## 2015-07-29 DIAGNOSIS — Z7901 Long term (current) use of anticoagulants: Secondary | ICD-10-CM

## 2015-07-29 DIAGNOSIS — Z950 Presence of cardiac pacemaker: Secondary | ICD-10-CM

## 2015-07-29 DIAGNOSIS — I2510 Atherosclerotic heart disease of native coronary artery without angina pectoris: Secondary | ICD-10-CM

## 2015-07-29 NOTE — Progress Notes (Signed)
Patient ID: Lucas Porter, male   DOB: [birth year], 59 y.o.   MRN: [id number]      Cardiology Office Note   Date:  07/29/2015   ID:  Lucas Porter, DOB 05-26-1956, MRN [id number]  PCP:  Kennon Portela, MD  Cardiologist:  Thompson Grayer, MD;  [name], MD   Chief Complaint  Patient presents with  . Follow-up    chest pain-has some pressure, occassional shortness of breath, no edema, no pain in legs, no cramping in legs, occassional lightheadedness, occassional dizziness      History of Present Illness: Lucas Porter is a 59 y.o. male who presents for  Follow-up of numerous cardiac problems including syncope, recurrent paroxysmal atrial fibrillation, CAD status post surgical and percutaneous revascularization, pacemaker check.   About a month ago, Lucas Porter had an episode of syncope while sitting on a neighbor's porch. This was preceded by a sensation of extreme heat and diaphoresis. He actually had to remove his shirt because it was drenched. He then felt weak and had blurry vision and completely lost consciousness. He recovered spontaneously after  Lying on the ground. No particular change in his rhythm was noted on his pacemaker around that time.  Interrogation of his pacemaker does show extremely frequent (at least daily) episodes of atrial fibrillation generally with well-controlled ventricular response. The overall burden of atrial fibrillation is about 26%, similar to his recent historical trend.  His activity level remains high at about 6 hours a day. There is 58% atrial pacing and 16% ventricular pacing. Battery voltage is 2.96 V on his device that is a Medtronic Revo implanted in 2012.   his ECG today shows normal sinus rhythm with a QTc interval of 446 ms on treatment with dofetilide.  Lucas Porter has not had problems with angina pectoris, either at rest or with exertion. He has noticed a steady reduction in his stamina. He becomes more easily short of breath when he bends over.  He has also gained 38 more pounds since his last office appointment and is now firmly in the moderately obese range.  He eats all the time. He will wake up 2 or 3 times a night to use the bathroom and will stop by the fridge for snack each time.  He denies edema and intermittent claudication and has not had any focal neurological deficits. He has not had bleeding complications while on warfarin anticoagulation.  Lucas Porter has a long-standing history of CAD. He underwent bypass surgery in 2011 (LIMA to LAD, free radial artery to left PDA , saphenous vein grafts to diagonal and ramus intermedius).  In 2013 he received a drug-eluting Promus 416 mm stent to the left main coronary artery extending into the proximal left circumflex artery.  He has undergone 2 separate radiofrequency ablation procedures for atrial fibrillation via the percutaneous approach in 2013 and 2014. 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  He has normal left ventricular systolic function.   his last heart catheterization was performed in May 2013 and showed the following findings:  1. The left main coronary artery is is very short but is severely diseased throughout especially in its distal portion where there is an approximately 70% stenosis. It generates multiple branches: The LAD artery, a larger cranial radius intermedius artery, a smaller bifurcating distal ramus intermedius artery and the dominant left circumflex coronary artery. All the branch vessels appear to have significant ostial stenosis. Retrograde filling of the vein graft bypass to the ramus intermedius  artery is seen. Small amount of competitive flow is seen to the LAD artery. The left main to circumflex stent is widely patent with minimal in stent restenosis. 2. The left anterior descending artery is not seen during angiography of the left coronary artery the to a severe ostial stenosis. By previous angiography its is a  large vessel that reaches the apex and generates one major diagonal branch.  3. The cranial ramus intermedius artery branch has an 90% ostial stenosis that extends over a long portion of the proximal vessel; retrograde filling of a saphenous vein graft bypass is seen 4. The caudal ramus intermedius artery branch is relatively small and bifurcates early it also has an approximately 90% ostial stenosis  5. The left circumflex coronary artery is a very large-size vessel dominant vessel that generates 4 major branches: 3 major oblique marginal/posterolateral arteries (the third being the largest) and a large left posterior descending artery. The left main stent extends across the ramii ostia into the circumlex artery and is widely patent as described above. There is evidence of moderate diffuse luminal irregularities and mild calcification throughout the remainder of the vessel. No other hemodynamically meaningful stenoses are seen. There may be a 40% stenosis just before the crux and the beginning of the PDA, but the area is difficult to visualize due to tortuosity and overlap.  6. The right coronary artery is a small-size non- dominant vessel that generates only right ventricular branches. There is evidence of severe luminal irregularities and mild calcification. A. 70% stenosis is seen in the mid vessel.  7. The left internal mammary artery bypass to the mid LAD artery is a widely patent healthy vessel.  8. The saphenous vein graft bypass to the ramus intermedius artery is widely patent with minimal luminal irregularities.  9. The saphenous vein graft bypass to the first diagonal artery has mild luminal irregularities has excellent flow and feeds a relatively small vascular bed.  10. The radial artery bypass to the posterior descending artery is totally occluded. A short segment of vessel with an extremely thin lumen is seen. No distal flow is noted. Even after administration of 200 mcg of  intra-arterial nitroglycerin there is no improvement   Past Medical History  Diagnosis Date  . Coronary artery disease     s/p CABG 2010, s/p Successful PCI  10/17/11 of Distal Left Main and the Left Circumflex with a Promus Element DES stent -- 4.0 mm x 16 mm postdilated to 4.12 mm   . Hypertension   . Anxiety   . DDD (degenerative disc disease)   . Atrial fibrillation (HCC)     paroxysmal  . S/P coronary artery stent placement to distal Lt. main and Lt. Cx 10/17/11  . 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 Coagulopathy ([place])     persistent elevation of ACT during 10/17/11 hospitalization  . Atrial flutter ([place])   . Hyperlipidemia   . Heart murmur   . Kidney stones     Past Surgical History  Procedure Laterality Date  . Coronary angioplasty  10/19/11  . Coronary artery bypass graft  2010  . Insert / replace / remove pacemaker  2/12    Medtronic  . Tee without cardioversion  04/23/2012    Procedure: TRANSESOPHAGEAL ECHOCARDIOGRAM (TEE);  Surgeon: Josue Hector, MD;  Location: [facility] ENDOSCOPY;  Service: Cardiovascular;  Laterality: N/A;  spoke to pt about time change from 11a to 12p/DL  . Atrial fibrillation  ablation  04/24/12, 12/02/12    PVI x2 and CTI ablation by Dr Rayann Heman  . Tee without cardioversion N/A 12/01/2012    Procedure: TRANSESOPHAGEAL ECHOCARDIOGRAM (TEE);  Surgeon: [name], MD;  Location: [facility] ENDOSCOPY;  Service: Cardiovascular;  Laterality: N/A;  . Nm myocar perf wall motion  03/23/2011    Low risk  . Left heart catheterization with coronary/graft angiogram N/A 10/17/2011    Procedure: LEFT HEART CATHETERIZATION WITH Beatrix Fetters;  Surgeon: [name], MD;  Location: [place] CATH LAB;  Service: Cardiovascular;  Laterality: N/A;  . Percutaneous coronary stent intervention (pci-s)  10/17/2011    Procedure: PERCUTANEOUS CORONARY STENT INTERVENTION (PCI-S);  Surgeon: [name], MD;  Location: [facility] CATH LAB;  Service:  Cardiovascular;;  . Left heart catheterization with coronary/graft angiogram N/A 02/08/2012    Procedure: LEFT HEART CATHETERIZATION WITH Beatrix Fetters;  Surgeon: [name], MD;  Location: [place] CATH LAB;  Service: Cardiovascular;  Laterality: N/A;  . Atrial fibrillation ablation N/A 04/24/2012    Procedure: ATRIAL FIBRILLATION ABLATION;  Surgeon: Thompson Grayer, MD;  Location: [facility] CATH LAB;  Service: Cardiovascular;  Laterality: N/A;  . Atrial fibrillation ablation N/A 12/02/2012    Procedure: ATRIAL FIBRILLATION ABLATION;  Surgeon: Thompson Grayer, MD;  Location: [facility] CATH LAB;  Service: Cardiovascular;  Laterality: N/A;     Current Outpatient Prescriptions  Medication Sig Dispense Refill  . atenolol (TENORMIN) 100 MG tablet Take 1 tablet by mouth daily 30 tablet 11  . AXIRON 30 MG/ACT SOLN APPLY 30 MG TO EACH AXILLA TOPICALLY ONCE DAILY IN THE MORNING 90 mL 2  . baclofen (LIORESAL) 10 MG tablet Take 1/2 to 1 tab po TID prn for muscle spasms, may sedate so do not take with clonazepam 20 each 0  . clonazePAM (KLONOPIN) 0.5 MG tablet Take 1 tablet (0.5 mg total) by mouth 2 (two) times daily as needed for anxiety. 30 tablet 1  . clopidogrel (PLAVIX) 75 MG tablet Take 1 tablet (75 mg total) by mouth daily. 90 tablet 3  . diltiazem (DILACOR XR) 180 MG 24 hr capsule TAKE ONE CAPSULE BY MOUTH ONCE DAILY 90 capsule 2  . dofetilide (TIKOSYN) 500 MCG capsule Take 1 capsule (500 mcg total) by mouth 2 (two) times daily. 180 capsule 0  . fish oil-omega-3 fatty acids 1000 MG capsule Take 2 g by mouth 2 (two) times daily.    Marland Kitchen HYDROcodone-acetaminophen (NORCO) 5-325 MG per tablet Take 1 tablet by mouth every 6 (six) hours as needed for moderate pain. Take with stool softener, may cause constipation and drowsiness, do not take extra tylenol. 20 tablet 0  . KLOR-CON M10 10 MEQ tablet TAKE ONE TABLET BY MOUTH ONCE DAILY 30 tablet 11  . lisinopril (PRINIVIL,ZESTRIL) 20 MG tablet Take 1 tablet (20 mg total) by  mouth daily. 30 tablet 11  . nitroGLYCERIN (NITROSTAT) 0.4 MG SL tablet Place 1 tablet (0.4 mg total) under the tongue every 5 (five) minutes as needed for chest pain. 25 tablet 3  . pantoprazole (PROTONIX) 40 MG tablet Take 1 tablet (40 mg total) by mouth daily. 30 tablet 5  . pravastatin (PRAVACHOL) 80 MG tablet Take 1 tablet (80 mg total) by mouth daily. 30 tablet 6  . venlafaxine XR (EFFEXOR-XR) 150 MG 24 hr capsule Take 1 capsule by mouth daily with 75 mg to equal total of 225 mg daily. 30 capsule 11  . venlafaxine XR (EFFEXOR-XR) 75 MG 24 hr capsule TAKE ONE CAPSULE BY MOUTH ONCE DAILY TAKE  WITH  150  MG  TO  EQUAL  A  TOTAL  OF  225  MG  DAILY 30 capsule 11  . warfarin (COUMADIN) 5 MG tablet TAKE ONE TABLET BY MOUTH ONCE DAILY OR  AS  DIRECTED  BY  COUMADIN  CLINIC 90 tablet 1   No current facility-administered medications for this visit.    Allergies:   Review of patient's allergies indicates no known allergies.    Social History:  The patient  reports that he quit smoking about 51 years ago. His smoking use included Cigarettes. He quit after 1 year of use. He quit smokeless tobacco use about 2 years ago. He reports that he drinks about 0.6 oz of alcohol per week. He reports that he does not use illicit drugs.   Family History:  The patient's family history includes Cancer in his mother; Coronary artery disease in an other family member; Stroke in his brother.    ROS:  Please see the history of present illness.    Otherwise, review of systems positive for none.   All other systems are reviewed and negative.    PHYSICAL EXAM: VS:  BP 136/88 mmHg  Pulse 65  Ht 5\' 10"  (1.778 m)  Wt 237 lb 3 oz (107.588 kg)  BMI 34.03 kg/m2 , BMI Body mass index is 34.03 kg/(m^2).  General: Alert, oriented x3, no distress Head: no evidence of trauma, PERRL, EOMI, no exophtalmos or lid lag, no myxedema, no xanthelasma; normal ears, nose and oropharynx Neck: normal jugular venous pulsations and no  hepatojugular reflux; brisk carotid pulses without delay and no carotid bruits Chest: clear to auscultation, no signs of consolidation by percussion or palpation, normal fremitus, symmetrical and full respiratory excursions Cardiovascular: normal position and quality of the apical impulse, regular rhythm with occasional ectopy, normal first and second heart sounds, no murmurs, rubs or gallops Abdomen: no tenderness or distention, no masses by palpation, no abnormal pulsatility or arterial bruits, normal bowel sounds, no hepatosplenomegaly Extremities: no clubbing, cyanosis or edema; 2+ radial, ulnar and brachial pulses bilaterally; 2+ right femoral, posterior tibial and dorsalis pedis pulses; 2+ left femoral, posterior tibial and dorsalis pedis pulses; no subclavian or femoral bruits Neurological: grossly nonfocal Psych: euthymic mood, full affect   EKG:  EKG is ordered today. The ekg ordered today demonstrates  Normal sinus rhythm, QTC 446 ms   Recent Labs: 12/13/2014: ALT 24; BUN 14; Creat 0.78; Hemoglobin 15.6; Platelets 181; Potassium 4.2; Sodium 134*    Lipid Panel    Component Value Date/Time   CHOL 218* 12/13/2014 1046   TRIG 390* 12/13/2014 1046   HDL 34* 12/13/2014 1046   CHOLHDL 6.4 12/13/2014 1046   VLDL 78* 12/13/2014 1046   LDLCALC 106* 12/13/2014 1046      Wt Readings from Last 3 Encounters:  07/29/15 237 lb 3 oz (107.588 kg)  04/28/15 231 lb (104.781 kg)  12/13/14 228 lb (103.42 kg)      ASSESSMENT AND PLAN:  CAD (coronary artery disease) with CABG 2011 (LIMA to LAD, Free radial to Lt. PDA, VG to Diag and ramus intermedius), drug-eluting stent to left coronary artery and proximal left circumflex coronary artery January 2013  Asymptomatic. No ischemia or scar by 06/2011 nuclear scan. Normal EF by echo10/2012. Cath 09/2011 led to drug eluting Promus 4x16 mm stent to LMCA into proximal LCX.  Cardiac cath following placement of the stent showed interval occlusion of  the free radial artery bypass to the left PDA , without options for  revascularization. The focus is on risk factor modification.  History of pacemaker  Normal device function.  Paroxysmal atrial fibrillation  S/P repeat ablation. Fairly high burden of arrhythmia. QTC OK on Tikosyn. Continue warfarin. Reviewed risk of drug interactions with both these agents and couple of other medications. Still has substantial atrial arrhythmia (>25% mode switch) despite ablation and dofetilide, but is really not symptomatic with it. Continue anticoagulation.  Amiodarone appears to be the only other option, but may be fraught with more side effects and does not appear to be justified at this point in time.  He generally has normal ventricular rates during atrial fibrillation. He did have one episode of atrial flutter with 2:1 conduction but this did not correlate with symptoms.  Hypertension  Good control   Dyslipidemia  Fair TC and LDL-C on statin Rx, but TG are higher than ever and fasting glucose is borderline for a diagnosis of diabetes mellitus.  He has gained even more weight since those labs were obtained.  It would be no surprise to see that he now has full blown diabetes.I think he really needs to lose weight and do better with his diet. We talked about this a lot today. He is on so many medications, I am reluctant to add a fibrate. The solution is better diet and more physical activity.  Neurocardiogenic syncope  his recent syncopal event was very similar to previous episodes of vasovagal syncope. I reminded him that it is important to take note of the prodromal symptoms and immediately get flat. His episodes of syncope have been very few recently and I think he simply did not remember what to do.    Current medicines are reviewed at length with the patient today.  The patient does not have concerns regarding medicines.  The following changes have been made:  no change  Labs/ tests ordered  today include:  Orders Placed This Encounter  Procedures  . Comprehensive metabolic panel  . Hemoglobin A1c  . Lipid panel  . EKG 12-Lead     Patient Instructions  Your physician recommends that you return for lab work in: [place] [facility] AT [facility].  Remote monitoring is used to monitor your Pacemaker from home. This monitoring reduces the number of office visits required to check your device to one time per year. It allows [place] to monitor the functioning of your device to ensure it is working properly. You are scheduled for a device check from home on October 30, 2015. You may send your transmission at any time that day. If you have a wireless device, the transmission will be sent automatically. After your physician reviews your transmission, you will receive a postcard with your next transmission date.  Dr. Sallyanne Kuster recommends that you schedule a follow-up appointment in: [place] ([place])         Signed, [name], MD  07/29/2015 1:36 PM    [name], MD, [facility] [phone] office [phone] pager

## 2015-07-29 NOTE — Patient Instructions (Signed)
Your physician recommends that you return for lab work in: [place] [facility] AT [facility].  Remote monitoring is used to monitor your Pacemaker from home. This monitoring reduces the number of office visits required to check your device to one time per year. It allows [place] to monitor the functioning of your device to ensure it is working properly. You are scheduled for a device check from home on October 30, 2015. You may send your transmission at any time that day. If you have a wireless device, the transmission will be sent automatically. After your physician reviews your transmission, you will receive a postcard with your next transmission date.  Dr. Sallyanne Kuster recommends that you schedule a follow-up appointment in: [place] ([place])

## 2015-08-01 ENCOUNTER — Other Ambulatory Visit: Payer: Self-pay

## 2015-08-01 MED ORDER — POTASSIUM CHLORIDE CRYS ER 10 MEQ PO TBCR: 10.0000 meq | EXTENDED_RELEASE_TABLET | Freq: Every day | ORAL | Status: DC

## 2015-08-05 ENCOUNTER — Ambulatory Visit (INDEPENDENT_AMBULATORY_CARE_PROVIDER_SITE_OTHER): Payer: BLUE CROSS/BLUE SHIELD | Admitting: Pharmacist Clinician (PhC)/ Clinical Pharmacy Specialist

## 2015-08-05 DIAGNOSIS — Z7901 Long term (current) use of anticoagulants: Secondary | ICD-10-CM

## 2015-08-05 DIAGNOSIS — I480 Paroxysmal atrial fibrillation: Secondary | ICD-10-CM | POA: Diagnosis not present

## 2015-08-05 LAB — POCT INR: INR: 2.3

## 2015-08-08 ENCOUNTER — Other Ambulatory Visit: Payer: Self-pay | Admitting: Cardiovascular Disease

## 2015-08-08 ENCOUNTER — Other Ambulatory Visit: Payer: Self-pay | Admitting: Family Medicine

## 2015-08-16 LAB — CUP PACEART INCLINIC DEVICE CHECK
Brady Statistic AP VS Percent: 53.8 %
Brady Statistic AS VS Percent: 29.94 %
Brady Statistic RV Percent Paced: 16.26 %
Date Time Interrogation Session: 20161111145416
Implantable Lead Implant Date: 20120229
Implantable Lead Model: 5086
Lead Channel Impedance Value: 568 Ohm
Lead Channel Sensing Intrinsic Amplitude: 0.924 mV
Lead Channel Setting Pacing Amplitude: 2.5 V
Lead Channel Setting Sensing Sensitivity: 0.9 mV
MDC IDC LEAD IMPLANT DT: 20120229
MDC IDC LEAD LOCATION: 753859
MDC IDC LEAD LOCATION: 753860
MDC IDC LEAD MODEL: 5086
MDC IDC MSMT BATTERY VOLTAGE: 2.96 V
MDC IDC MSMT LEADCHNL RA IMPEDANCE VALUE: 544 Ohm
MDC IDC SET LEADCHNL RA PACING AMPLITUDE: 2 V
MDC IDC SET LEADCHNL RV PACING PULSEWIDTH: 0.4 ms
MDC IDC STAT BRADY AP VP PERCENT: 4.23 %
MDC IDC STAT BRADY AS VP PERCENT: 12.03 %
MDC IDC STAT BRADY RA PERCENT PACED: 58.03 %

## 2015-08-23 ENCOUNTER — Encounter: Payer: Self-pay | Admitting: Cardiovascular Disease

## 2015-08-24 ENCOUNTER — Other Ambulatory Visit: Payer: Self-pay | Admitting: *Deleted

## 2015-08-24 MED ORDER — DOFETILIDE 500 MCG PO CAPS: 500.0000 ug | ORAL_CAPSULE | Freq: Two times a day (BID) | ORAL | Status: DC

## 2015-09-02 ENCOUNTER — Ambulatory Visit (INDEPENDENT_AMBULATORY_CARE_PROVIDER_SITE_OTHER): Payer: Medicare Other | Admitting: Pharmacist Clinician (PhC)/ Clinical Pharmacy Specialist

## 2015-09-02 DIAGNOSIS — I480 Paroxysmal atrial fibrillation: Secondary | ICD-10-CM

## 2015-09-02 DIAGNOSIS — Z7901 Long term (current) use of anticoagulants: Secondary | ICD-10-CM | POA: Diagnosis not present

## 2015-09-02 LAB — POCT INR: INR: 1.5

## 2015-09-10 ENCOUNTER — Other Ambulatory Visit: Payer: Self-pay | Admitting: Family Medicine

## 2015-09-16 ENCOUNTER — Ambulatory Visit (INDEPENDENT_AMBULATORY_CARE_PROVIDER_SITE_OTHER): Payer: BLUE CROSS/BLUE SHIELD | Admitting: Pharmacist Clinician (PhC)/ Clinical Pharmacy Specialist

## 2015-09-16 DIAGNOSIS — Z7901 Long term (current) use of anticoagulants: Secondary | ICD-10-CM

## 2015-09-16 DIAGNOSIS — I480 Paroxysmal atrial fibrillation: Secondary | ICD-10-CM

## 2015-09-16 LAB — POCT INR: INR: 1.7

## 2015-09-28 ENCOUNTER — Telehealth: Payer: Self-pay

## 2015-09-28 MED ORDER — PRAVASTATIN SODIUM 80 MG PO TABS: ORAL_TABLET | ORAL | Status: DC

## 2015-09-28 NOTE — Telephone Encounter (Signed)
Rx sent in. Left VM

## 2015-09-28 NOTE — Telephone Encounter (Signed)
Pt's wife called wanting a refill on her husband's pravastatin (PRAVACHOL) 80 MG tablet [id number]. I let her know-"NO MORE REFILLS WITHOUT OFFICE VISIT". He has an appointment set up with Dr. Lorelei Pont on 1/25. He would like enough to hold him out until this app. CB # [phone]

## 2015-09-30 ENCOUNTER — Ambulatory Visit (INDEPENDENT_AMBULATORY_CARE_PROVIDER_SITE_OTHER): Payer: Medicare Other | Admitting: Pharmacist Clinician (PhC)/ Clinical Pharmacy Specialist

## 2015-09-30 DIAGNOSIS — I480 Paroxysmal atrial fibrillation: Secondary | ICD-10-CM

## 2015-09-30 DIAGNOSIS — Z7901 Long term (current) use of anticoagulants: Secondary | ICD-10-CM | POA: Diagnosis not present

## 2015-09-30 LAB — POCT INR: INR: 2.3

## 2015-10-12 ENCOUNTER — Encounter: Payer: Self-pay | Admitting: Family Medicine

## 2015-10-12 ENCOUNTER — Ambulatory Visit (INDEPENDENT_AMBULATORY_CARE_PROVIDER_SITE_OTHER): Payer: Medicare Other

## 2015-10-12 ENCOUNTER — Ambulatory Visit (INDEPENDENT_AMBULATORY_CARE_PROVIDER_SITE_OTHER): Payer: Medicare Other | Admitting: Family Medicine

## 2015-10-12 VITALS — BP 120/76 | HR 72 | Temp 98.1°F | Resp 16 | Ht 69.0 in | Wt 225.6 lb

## 2015-10-12 DIAGNOSIS — R053 Chronic cough: Secondary | ICD-10-CM

## 2015-10-12 DIAGNOSIS — K4090 Unilateral inguinal hernia, without obstruction or gangrene, not specified as recurrent: Secondary | ICD-10-CM | POA: Diagnosis not present

## 2015-10-12 DIAGNOSIS — I10 Essential (primary) hypertension: Secondary | ICD-10-CM

## 2015-10-12 DIAGNOSIS — E785 Hyperlipidemia, unspecified: Secondary | ICD-10-CM

## 2015-10-12 DIAGNOSIS — R1310 Dysphagia, unspecified: Secondary | ICD-10-CM

## 2015-10-12 DIAGNOSIS — R05 Cough: Secondary | ICD-10-CM | POA: Diagnosis not present

## 2015-10-12 DIAGNOSIS — Z23 Encounter for immunization: Secondary | ICD-10-CM | POA: Diagnosis not present

## 2015-10-12 DIAGNOSIS — R7309 Other abnormal glucose: Secondary | ICD-10-CM | POA: Diagnosis not present

## 2015-10-12 DIAGNOSIS — E669 Obesity, unspecified: Secondary | ICD-10-CM | POA: Diagnosis not present

## 2015-10-12 MED ORDER — PRAVASTATIN SODIUM 80 MG PO TABS: ORAL_TABLET | ORAL | Status: DC

## 2015-10-12 MED ORDER — LISINOPRIL 20 MG PO TABS: 20.0000 mg | ORAL_TABLET | Freq: Every day | ORAL | Status: DC

## 2015-10-12 NOTE — Progress Notes (Signed)
[facility] [address], [place] [address] [phone]  Date:  10/12/2015   Name:  Lucas Porter   DOB:  1955/12/03   MRN:  [id number]  PCP:  Kennon Portela, MD    Chief Complaint: Medication Refill; Cough; pt would like throat checked for cancer; and left testicle   History of Present Illness:  Lucas Porter is a 60 y.o. very pleasant male patient who presents with the following:  Here today for a recheck and medication refills.  History of CAD/ CABG/ atrial fib s/p ablation.   Anticoagulation is managed by cardiology. Dr. Sallyanne Kuster is his cardiologist  Wt Readings from Last 3 Encounters:  10/12/15 225 lb 9.6 oz (102.331 kg)  07/29/15 237 lb 3 oz (107.588 kg)  04/28/15 231 lb (104.781 kg)   There has been a concern about his weight and borderline DM- he has taken Dr Lurline Del advice to heart and has lost weight since his last visit there.   He simply stopped drinking sodas and is working on his diet.    He has been concerned because he used to chew tobacco (he also smoked for a couple of years in his teens) and has noted a cough. He did quit chewing tobacco finally 3- 4 years ago. He feels like perhaps his swallowing is not quite the same as it was in the past and he may feel a tightening in his throat when he swallows.   He has noted a cough for a couple of years if not longer. "It's like I need to clear my throat."  The cough does not seem to originate in his chest but in his throat.  He has not noted any swollen nodes  He lifted up a trailer a few weeks ago and felt an acute tearing feeling in the left groin,  and now notes an intermittent bulge in the left groin.  It hurts when he coughs.  He had the pain when he lifted the heavy weight, it was not severe but he has noted that it persisted.  He rates it as a 2/10.    He is not fasting currently   Lab Results  Component Value Date   HGBA1C 5.8* 06/21/2011    Patient Active Problem List   Diagnosis Date Noted  . 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 Symptomatic bradycardia 03/09/2013  . Long term (current) use of anticoagulants 12/03/2012  . BMI 40.0-44.9, adult ([place]) 05/13/2012  . Atrial flutter ([place]) 04/24/2012  . S/P coronary artery stent placement to distal Lt. main and Lt. Cx, 10/17/11 10/19/2011  . Angina pectoris 10/19/2011  . Coagulopathy ([place]) 10/19/2011  . History of pacemaker, with a-v pacing 10/19/2011  . CAD (coronary artery disease) with CABG 2011, LIMA to LAD, Free radial to Lt. PDA, VG to Diag and ramus intermedius 10/02/2011  . Anxiety 10/02/2011  . Paroxysmal atrial fibrillation ([place]) 10/02/2011  . Hypertension 10/02/2011  . Dyslipidemia 10/02/2011  . 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 Coronary artery disease     s/p CABG 2010, s/p Successful PCI  10/17/11 of Distal Left Main and the Left Circumflex with a Promus Element DES stent -- 4.0 mm x 16 mm postdilated to 4.12 mm   . Hypertension   . Anxiety   . DDD (degenerative disc disease)   . Atrial fibrillation ([facility])     paroxysmal  . S/P coronary artery stent placement to distal Lt. main  and Lt. Cx 10/17/11  . 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 Coagulopathy ([place])     persistent elevation of ACT during 10/17/11 hospitalization  . Atrial flutter ([place])   . Hyperlipidemia   . Heart murmur   . Kidney stones     Past Surgical History  Procedure Laterality Date  . Coronary angioplasty  10/19/11  . Coronary artery bypass graft  2010  . Insert / replace / remove pacemaker  2/12    Medtronic  . Tee without cardioversion  04/23/2012    Procedure: TRANSESOPHAGEAL ECHOCARDIOGRAM (TEE);  Surgeon: Josue Hector, MD;  Location: [facility] ENDOSCOPY;  Service: Cardiovascular;  Laterality: N/A;  spoke to pt about time change from 11a to 12p/DL  . Atrial fibrillation ablation  04/24/12, 12/02/12    PVI x2 and CTI ablation by Dr Rayann Heman  . Tee  without cardioversion N/A 12/01/2012    Procedure: TRANSESOPHAGEAL ECHOCARDIOGRAM (TEE);  Surgeon: [name], MD;  Location: [facility] ENDOSCOPY;  Service: Cardiovascular;  Laterality: N/A;  . Nm myocar perf wall motion  03/23/2011    Low risk  . Left heart catheterization with coronary/graft angiogram N/A 10/17/2011    Procedure: LEFT HEART CATHETERIZATION WITH Beatrix Fetters;  Surgeon: [name], MD;  Location: [place] CATH LAB;  Service: Cardiovascular;  Laterality: N/A;  . Percutaneous coronary stent intervention (pci-s)  10/17/2011    Procedure: PERCUTANEOUS CORONARY STENT INTERVENTION (PCI-S);  Surgeon: [name], MD;  Location: [facility] C[facility] LAB;  Service: Cardiovascular;;  . Left heart catheterization with coronary/graft angiogram N/A 02/08/2012    Procedure: LEFT HEART CATHETERIZATION WITH Beatrix Fetters;  Surgeon: [name], MD;  Location: [place] CATH LAB;  Service: Cardiovascular;  Laterality: N/A;  . Atrial fibrillation ablation N/A 04/24/2012    Procedure: ATRIAL FIBRILLATION ABLATION;  Surgeon: Thompson Grayer, MD;  Location: [place] [facility] CATH LAB;  Service: Cardiovascular;  Laterality: N/A;  . Atrial fibrillation ablation N/A 12/02/2012    Procedure: ATRIAL FIBRILLATION ABLATION;  Surgeon: Thompson Grayer, MD;  Location: [facility] CATH LAB;  Service: Cardiovascular;  Laterality: N/A;    Social History  Substance Use Topics  . Smoking status: Former Smoker -- 1 years    Types: Cigarettes    Quit date: 12/18/1963  . Smokeless tobacco: Former Systems developer    Quit date: 10/03/2012     Comment: quit smoking in 1970's  . Alcohol Use: 0.6 oz/week    1 Cans of beer per week     Comment: rare    Family History  Problem Relation Age of Onset  . Coronary artery disease    . Stroke Brother   . Cancer Mother     No Known Allergies  Medication list has been reviewed and updated.  Current Outpatient Prescriptions on File Prior to Visit  Medication Sig Dispense Refill  . atenolol (TENORMIN) 100 MG  tablet Take 1 tablet by mouth daily 30 tablet 11  . baclofen (LIORESAL) 10 MG tablet Take 1/2 to 1 tab po TID prn for muscle spasms, may sedate so do not take with clonazepam 20 each 0  . clonazePAM (KLONOPIN) 0.5 MG tablet Take 1 tablet (0.5 mg total) by mouth 2 (two) times daily as needed for anxiety. 30 tablet 1  . clopidogrel (PLAVIX) 75 MG tablet TAKE ONE TABLET BY MOUTH ONCE DAILY 90 tablet 3  . diltiazem (DILACOR XR) 180 MG 24 hr capsule TAKE ONE CAPSULE BY MOUTH ONCE DAILY 90 capsule 2  . dofetilide (TIKOSYN) 500  MCG capsule Take 1 capsule (500 mcg total) by mouth 2 (two) times daily. 180 capsule 1  . fish oil-omega-3 fatty acids 1000 MG capsule Take 2 g by mouth 2 (two) times daily.    Marland Kitchen HYDROcodone-acetaminophen (NORCO) 5-325 MG per tablet Take 1 tablet by mouth every 6 (six) hours as needed for moderate pain. Take with stool softener, may cause constipation and drowsiness, do not take extra tylenol. 20 tablet 0  . lisinopril (PRINIVIL,ZESTRIL) 20 MG tablet Take 1 tablet (20 mg total) by mouth daily. 30 tablet 11  . nitroGLYCERIN (NITROSTAT) 0.4 MG SL tablet Place 1 tablet (0.4 mg total) under the tongue every 5 (five) minutes as needed for chest pain. 25 tablet 3  . pantoprazole (PROTONIX) 40 MG tablet Take 1 tablet (40 mg total) by mouth daily. 30 tablet 5  . potassium chloride (KLOR-CON M10) 10 MEQ tablet Take 1 tablet (10 mEq total) by mouth daily. 30 tablet 6  . pravastatin (PRAVACHOL) 80 MG tablet TAKE ONE TABLET BY MOUTH ONCE DAILY.  "NO MORE REFILLS WITHOUT OFFICE VISIT" 30 tablet 0  . venlafaxine XR (EFFEXOR-XR) 150 MG 24 hr capsule Take 1 capsule by mouth daily with 75 mg to equal total of 225 mg daily. 30 capsule 11  . venlafaxine XR (EFFEXOR-XR) 75 MG 24 hr capsule TAKE ONE CAPSULE BY MOUTH ONCE DAILY TAKE  WITH  150  MG  TO  EQUAL  A  TOTAL  OF  225  MG  DAILY 30 capsule 11  . warfarin (COUMADIN) 5 MG tablet TAKE ONE TABLET BY MOUTH ONCE DAILY OR  AS  DIRECTED  BY  COUMADIN   CLINIC 90 tablet 1  . AXIRON 30 MG/ACT SOLN APPLY 30 MG TO EACH AXILLA TOPICALLY ONCE DAILY IN THE MORNING (Patient not taking: Reported on 10/12/2015) 90 mL 2   No current facility-administered medications on file prior to visit.    Review of Systems:  As per HPI- otherwise negative.   Physical Examination: Filed Vitals:   10/12/15 1357  BP: 120/76  Pulse: 72  Temp: 98.1 F (36.7 C)  Resp: 16   Filed Vitals:   10/12/15 1357  Height: 5\' 9"  (1.753 m)  Weight: 225 lb 9.6 oz (102.331 kg)   Body mass index is 33.3 kg/(m^2). Ideal Body Weight: Weight in (lb) to have BMI = 25: 168.9  GEN: WDWN, NAD, Non-toxic, A & O x 3, obese but has lost HEENT: Atraumatic, Normocephalic. Neck supple. No masses, No LAD.  Bilateral TM wnl, oropharynx normal.  PEERL,EOMI.   Ears and Nose: No external deformity. CV: RRR, No M/G/R. No JVD. No thrill. No extra heart sounds. PULM: CTA B, no wheezes, crackles, rhonchi. No retractions. No resp. distress. No accessory muscle use. ABD: S, NT, ND EXTR: No c/c/e NEURO Normal gait.  PSYCH: Normally interactive. Conversant. Not depressed or anxious appearing.  Calm demeanor.  Gu: normal testicles and penis.  He does not have a palpable bulge on valsalva but does note some discomfort in the left inguinal canal suggestive of a hernia  [facility] reading (PRIMARY) by  Dr. Lorelei Pont. CXR: negative   Assessment and Plan: Essential hypertension - Plan: lisinopril (PRINIVIL,ZESTRIL) 20 MG tablet  Flu vaccine need - Plan: Flu Vaccine QUAD 36+ mos IM  Hyperlipidemia - Plan: pravastatin (PRAVACHOL) 80 MG tablet  Persistent cough - Plan: DG Chest 2 View  Left inguinal hernia - Plan: Ambulatory referral to General Surgery  Obesity - Plan: Hemoglobin A1c  Elevated glucose -  Plan: Hemoglobin A1c  Swallowing difficulty - Plan: Ambulatory referral to ENT  BP is ok today- refilled medication He will have a cholesterol panel soon- did not draw blood today. Asked him to  have an A1c at next draw also He has lost weight- great job!   Encouraged him to keep it up He is concerned about head and neck cancer due to long history of tobacco.  He has also noted some chronic throat symptoms.  Will refer to ENT to evaluate He has a mild left inguinal hernia.  Will refer to general surgery but counseled him that as long as the pain decreases with time and the bulge does not get much larger would likely be best to avoid surgery given his heart problems   Signed Lamar Blinks, MD

## 2015-10-12 NOTE — Patient Instructions (Addendum)
Great job on your weight loss so far- keep it up! When you have your blood drawn next please ask them to draw an A1c test (screening for diabetes) which I have ordered for you today  I will refer you to general surgery to talk about your left groin (inguinal) hernia. If you do have any persistent or severe pain please go to the Er.  Otherwise avoid heavy lifting and straining and you can discuss possible repair with your surgeon at your convenience. Given your heart concerns I would think that surgery is NOT indicated assuming that your pain gets better/ does not get worse   Your chest x-ray looks fine to me but I will let you know if the radiologist says anything different  We will also have ENT take a look at your mouth and throat.

## 2015-10-14 ENCOUNTER — Telehealth: Payer: Self-pay | Admitting: Cardiovascular Disease

## 2015-10-14 DIAGNOSIS — R7309 Other abnormal glucose: Secondary | ICD-10-CM | POA: Diagnosis not present

## 2015-10-14 DIAGNOSIS — Z79899 Other long term (current) drug therapy: Secondary | ICD-10-CM | POA: Diagnosis not present

## 2015-10-14 DIAGNOSIS — E785 Hyperlipidemia, unspecified: Secondary | ICD-10-CM | POA: Diagnosis not present

## 2015-10-14 LAB — COMPREHENSIVE METABOLIC PANEL
ALBUMIN: 4.2 g/dL (ref 3.6–5.1)
ALK PHOS: 69 U/L (ref 40–115)
ALT: 22 U/L (ref 9–46)
AST: 27 U/L (ref 10–35)
BUN: 13 mg/dL (ref 7–25)
CALCIUM: 9.1 mg/dL (ref 8.6–10.3)
CO2: 26 mmol/L (ref 20–31)
Chloride: 100 mmol/L (ref 98–110)
Creat: 0.84 mg/dL (ref 0.70–1.33)
Glucose, Bld: 120 mg/dL — ABNORMAL HIGH (ref 65–99)
Potassium: 4.4 mmol/L (ref 3.5–5.3)
Sodium: 138 mmol/L (ref 135–146)
TOTAL PROTEIN: 6.7 g/dL (ref 6.1–8.1)
Total Bilirubin: 0.7 mg/dL (ref 0.2–1.2)

## 2015-10-14 LAB — LIPID PANEL
CHOL/HDL RATIO: 6.2 ratio — AB (ref <=5.0)
CHOLESTEROL: 180 mg/dL (ref 125–200)
HDL: 29 mg/dL — AB (ref >=40)
LDL Cholesterol: 95 mg/dL (ref <130)
TRIGLYCERIDES: 282 mg/dL — AB (ref <150)
VLDL: 56 mg/dL — ABNORMAL HIGH (ref <30)

## 2015-10-14 NOTE — Telephone Encounter (Signed)
Follow up     Need the physical address - can leave a message on voice mail

## 2015-10-14 NOTE — Telephone Encounter (Signed)
Left detailed message for [facility] with the directions to the lab at the [facility] office

## 2015-10-14 NOTE — Telephone Encounter (Signed)
Left message for pt to call.

## 2015-10-14 NOTE — Telephone Encounter (Signed)
New message     Wife calling needs direction where to go for lab drawn.

## 2015-10-15 LAB — HEMOGLOBIN A1C
HEMOGLOBIN A1C: 6.5 % — AB (ref <5.7)
MEAN PLASMA GLUCOSE: 140 mg/dL — AB (ref <117)

## 2015-10-25 DIAGNOSIS — K4090 Unilateral inguinal hernia, without obstruction or gangrene, not specified as recurrent: Secondary | ICD-10-CM | POA: Diagnosis not present

## 2015-10-28 ENCOUNTER — Ambulatory Visit (INDEPENDENT_AMBULATORY_CARE_PROVIDER_SITE_OTHER): Payer: BLUE CROSS/BLUE SHIELD | Admitting: Pharmacist Clinician (PhC)/ Clinical Pharmacy Specialist

## 2015-10-28 DIAGNOSIS — I480 Paroxysmal atrial fibrillation: Secondary | ICD-10-CM

## 2015-10-28 DIAGNOSIS — Z7901 Long term (current) use of anticoagulants: Secondary | ICD-10-CM

## 2015-10-28 LAB — POCT INR: INR: 2.4

## 2015-11-01 ENCOUNTER — Telehealth: Payer: Self-pay | Admitting: Cardiology

## 2015-11-01 ENCOUNTER — Ambulatory Visit (INDEPENDENT_AMBULATORY_CARE_PROVIDER_SITE_OTHER): Payer: Medicare Other | Admitting: *Deleted

## 2015-11-01 DIAGNOSIS — I480 Paroxysmal atrial fibrillation: Secondary | ICD-10-CM

## 2015-11-01 NOTE — Telephone Encounter (Signed)
Spoke with pt and reminded pt of remote transmission that is due today. Pt verbalized understanding.   

## 2015-11-02 ENCOUNTER — Encounter: Payer: Self-pay | Admitting: Cardiology

## 2015-11-02 LAB — CUP PACEART REMOTE DEVICE CHECK
Brady Statistic AP VP Percent: 3.53 %
Brady Statistic AP VS Percent: 51.4 %
Brady Statistic AS VP Percent: 12.25 %
Brady Statistic RV Percent Paced: 15.78 %
Date Time Interrogation Session: 20170214231323
Implantable Lead Implant Date: 20120229
Implantable Lead Location: 753859
Implantable Lead Model: 5086
Implantable Lead Model: 5086
Lead Channel Impedance Value: 528 Ohm
Lead Channel Setting Pacing Amplitude: 2 V
Lead Channel Setting Pacing Amplitude: 2.5 V
Lead Channel Setting Pacing Pulse Width: 0.4 ms
Lead Channel Setting Sensing Sensitivity: 0.9 mV
MDC IDC LEAD IMPLANT DT: 20120229
MDC IDC LEAD LOCATION: 753860
MDC IDC MSMT BATTERY VOLTAGE: 2.95 V
MDC IDC MSMT LEADCHNL RA SENSING INTR AMPL: 4.048 mV
MDC IDC MSMT LEADCHNL RV IMPEDANCE VALUE: 552 Ohm
MDC IDC STAT BRADY AS VS PERCENT: 32.82 %
MDC IDC STAT BRADY RA PERCENT PACED: 54.93 %

## 2015-11-02 NOTE — Progress Notes (Signed)
Remote pacemaker transmission.   

## 2015-11-07 ENCOUNTER — Telehealth: Payer: Self-pay | Admitting: *Deleted

## 2015-11-07 ENCOUNTER — Encounter: Payer: Self-pay | Admitting: Cardiovascular Disease

## 2015-11-07 NOTE — Telephone Encounter (Signed)
Sent via Epic

## 2015-11-07 NOTE — Telephone Encounter (Signed)
Requesting surgical clearance:   1. Type of surgery: HERNIA REPAIR  2. Surgeon: Jillyn Hidden   3. Surgical date: PENDING CARDIAC CLEARANCE  4. Medications that need to be held: PLAVIX AND COUMADIN                                                           ?LOVENOX BRIDGING                                                             PACEMAKER DEVICE CHECK INFORMATION

## 2015-11-08 DIAGNOSIS — J310 Chronic rhinitis: Secondary | ICD-10-CM | POA: Diagnosis not present

## 2015-11-08 DIAGNOSIS — J0131 Acute recurrent sphenoidal sinusitis: Secondary | ICD-10-CM | POA: Diagnosis not present

## 2015-11-16 ENCOUNTER — Encounter: Payer: Self-pay | Admitting: Cardiology

## 2015-11-29 DIAGNOSIS — J310 Chronic rhinitis: Secondary | ICD-10-CM | POA: Diagnosis not present

## 2015-11-29 DIAGNOSIS — J0131 Acute recurrent sphenoidal sinusitis: Secondary | ICD-10-CM | POA: Diagnosis not present

## 2015-12-09 ENCOUNTER — Ambulatory Visit (INDEPENDENT_AMBULATORY_CARE_PROVIDER_SITE_OTHER): Payer: Medicare Other | Admitting: Pharmacist Clinician (PhC)/ Clinical Pharmacy Specialist

## 2015-12-09 DIAGNOSIS — I480 Paroxysmal atrial fibrillation: Secondary | ICD-10-CM | POA: Diagnosis not present

## 2015-12-09 DIAGNOSIS — Z7901 Long term (current) use of anticoagulants: Secondary | ICD-10-CM | POA: Diagnosis not present

## 2015-12-09 LAB — POCT INR: INR: 1.9

## 2015-12-23 ENCOUNTER — Ambulatory Visit: Payer: Self-pay | Admitting: General Surgery

## 2015-12-26 ENCOUNTER — Other Ambulatory Visit (HOSPITAL_COMMUNITY): Payer: Self-pay | Admitting: *Deleted

## 2015-12-26 ENCOUNTER — Other Ambulatory Visit: Payer: Self-pay | Admitting: Family Medicine

## 2015-12-26 NOTE — Pre-Procedure Instructions (Signed)
Shyrone Bertsch [place]  12/26/2015      [facility] - [place], [place] - [address] [place] [address] Phone: [phone] Fax: [phone]    Your procedure is scheduled on April 19.  Report to [facility] Admitting at 630 A.M.  Call this number if you have problems the morning of surgery:  [phone]   Remember:  Do not eat food or drink liquids after midnight.  Take these medicines the morning of surgery with A SIP OF WATER Atenolol (Tenormin), Clonazepam (Klonopin) if needed, Diltiazem (Dilacor), dofetilide (Tikosyn), Hydrocodone (Norco) if needed, Nitroglycerin if needed, Pantoprazole (Protonix), Venlafaxine XR (Effexor-xr),   Stop Plavix & WARFARIN as directed by your Dr.  Stop taking aspirin, Ibuprofen, Advil, Motrin, Aleve, BC's, Goody's, Herbal medications, Fish Oil   Do not wear jewelry  Do not wear lotions, powders, or perfumes.  You may wear deodorant.             Men may shave face and neck.  Do not bring valuables to the hospital.  [facility] is not responsible for any belongings or valuables.  Contacts, dentures or bridgework may not be worn into surgery.  Leave your suitcase in the car.  After surgery it may be brought to your room.  For patients admitted to the hospital, discharge time will be determined by your treatment team.  Patients discharged the day of surgery will not be allowed to drive home.    Special instructions:  [facility] - Preparing for Surgery  Before surgery, you can play an important role.  Because skin is not sterile, your skin needs to be as free of germs as possible.  You can reduce the number of germs on you skin by washing with CHG (chlorahexidine gluconate) soap before surgery.  CHG is an antiseptic cleaner which kills germs and bonds with the skin to continue killing germs even after washing.  Please DO NOT use if you have an allergy to CHG or antibacterial soaps.  If your skin  becomes reddened/irritated stop using the CHG and inform your nurse when you arrive at Short Stay.  Do not shave (including legs and underarms) for at least 48 hours prior to the first CHG shower.  You may shave your face.  Please follow these instructions carefully:   1.  Shower with CHG Soap the night before surgery and the    morning of Surgery.  2.  If you choose to wash your hair, wash your hair first as usual with your   normal shampoo.  3.  After you shampoo, rinse your hair and body thoroughly to remove the  Shampoo.  4.  Use CHG as you would any other liquid soap.  You can apply chg directly to the skin and wash gently with scrungie or a clean washcloth.  5.  Apply the CHG Soap to your body ONLY FROM THE NECK DOWN.   Do not use on open wounds or open sores.  Avoid contact with your eyes, ears, mouth and genitals (private parts).  Wash genitals (private parts)  with your normal soap.  6.  Wash thoroughly, paying special attention to the area where your surgery will be performed.  7.  Thoroughly rinse your body with warm water from the neck down.  8.  DO NOT shower/wash with your normal soap after using and rinsing off  the CHG Soap.  9.  Pat yourself dry with a clean towel.  10.  Wear clean pajamas.            11.  Place clean sheets on your bed the night of your first shower and do not  sleep with pets.  Day of Surgery  Do not apply any lotions/deoderants the morning of surgery.  Please wear clean clothes to the hospital/surgery center.     Please read over the following fact sheets that you were given. Pain Booklet, Coughing and Deep Breathing and Surgical Site Infection Prevention

## 2015-12-27 ENCOUNTER — Encounter (HOSPITAL_COMMUNITY)
Admission: RE | Admit: 2015-12-27 | Discharge: 2015-12-27 | Disposition: A | Discharge status: Home / Self Care | Payer: BLUE CROSS/BLUE SHIELD | Source: Ambulatory Visit | Attending: General Surgery | Admitting: General Surgery

## 2015-12-27 ENCOUNTER — Encounter (HOSPITAL_COMMUNITY): Payer: Self-pay

## 2015-12-27 DIAGNOSIS — Z7902 Long term (current) use of antithrombotics/antiplatelets: Secondary | ICD-10-CM | POA: Diagnosis not present

## 2015-12-27 DIAGNOSIS — I495 Sick sinus syndrome: Secondary | ICD-10-CM | POA: Diagnosis not present

## 2015-12-27 DIAGNOSIS — I2510 Atherosclerotic heart disease of native coronary artery without angina pectoris: Secondary | ICD-10-CM | POA: Diagnosis not present

## 2015-12-27 DIAGNOSIS — Z955 Presence of coronary angioplasty implant and graft: Secondary | ICD-10-CM | POA: Diagnosis not present

## 2015-12-27 DIAGNOSIS — I10 Essential (primary) hypertension: Secondary | ICD-10-CM | POA: Diagnosis not present

## 2015-12-27 DIAGNOSIS — Z01812 Encounter for preprocedural laboratory examination: Secondary | ICD-10-CM | POA: Insufficient documentation

## 2015-12-27 DIAGNOSIS — Z79899 Other long term (current) drug therapy: Secondary | ICD-10-CM | POA: Diagnosis not present

## 2015-12-27 DIAGNOSIS — Z87891 Personal history of nicotine dependence: Secondary | ICD-10-CM | POA: Insufficient documentation

## 2015-12-27 DIAGNOSIS — K4090 Unilateral inguinal hernia, without obstruction or gangrene, not specified as recurrent: Secondary | ICD-10-CM | POA: Insufficient documentation

## 2015-12-27 DIAGNOSIS — E785 Hyperlipidemia, unspecified: Secondary | ICD-10-CM | POA: Insufficient documentation

## 2015-12-27 DIAGNOSIS — Z951 Presence of aortocoronary bypass graft: Secondary | ICD-10-CM | POA: Diagnosis not present

## 2015-12-27 DIAGNOSIS — Z950 Presence of cardiac pacemaker: Secondary | ICD-10-CM | POA: Diagnosis not present

## 2015-12-27 DIAGNOSIS — I4891 Unspecified atrial fibrillation: Secondary | ICD-10-CM | POA: Insufficient documentation

## 2015-12-27 DIAGNOSIS — Z7901 Long term (current) use of anticoagulants: Secondary | ICD-10-CM | POA: Diagnosis not present

## 2015-12-27 HISTORY — DX: Gastro-esophageal reflux disease without esophagitis: K21.9

## 2015-12-27 HISTORY — DX: Major depressive disorder, single episode, unspecified: F32.9

## 2015-12-27 HISTORY — DX: Depression, unspecified: F32.A

## 2015-12-27 HISTORY — DX: Presence of cardiac pacemaker: Z95.0

## 2015-12-27 HISTORY — DX: Congenital facial asymmetry: Q67.0

## 2015-12-27 LAB — CBC
HCT: 44.2 % (ref 39.0–52.0)
HEMOGLOBIN: 15.3 g/dL (ref 13.0–17.0)
MCH: 29.8 pg (ref 26.0–34.0)
MCHC: 34.6 g/dL (ref 30.0–36.0)
MCV: 86 fL (ref 78.0–100.0)
PLATELETS: 182 10*3/uL (ref 150–400)
RBC: 5.14 MIL/uL (ref 4.22–5.81)
RDW: 13.6 % (ref 11.5–15.5)
WBC: 8.2 10*3/uL (ref 4.0–10.5)

## 2015-12-27 LAB — BASIC METABOLIC PANEL
ANION GAP: 11 (ref 5–15)
BUN: 12 mg/dL (ref 6–20)
CALCIUM: 9.4 mg/dL (ref 8.9–10.3)
CHLORIDE: 101 mmol/L (ref 101–111)
CO2: 26 mmol/L (ref 22–32)
CREATININE: 0.9 mg/dL (ref 0.61–1.24)
GFR calc non Af Amer: >60 mL/min (ref >60)
Glucose, Bld: 131 mg/dL — ABNORMAL HIGH (ref 65–99)
Potassium: 4.5 mmol/L (ref 3.5–5.1)
SODIUM: 138 mmol/L (ref 135–145)

## 2015-12-27 LAB — HEPATIC FUNCTION PANEL
ALBUMIN: 4 g/dL (ref 3.5–5.0)
ALT: 24 U/L (ref 17–63)
AST: 27 U/L (ref 15–41)
Alkaline Phosphatase: 75 U/L (ref 38–126)
BILIRUBIN INDIRECT: 0.5 mg/dL (ref 0.3–0.9)
Bilirubin, Direct: 0.2 mg/dL (ref 0.1–0.5)
TOTAL PROTEIN: 6.8 g/dL (ref 6.5–8.1)
Total Bilirubin: 0.7 mg/dL (ref 0.3–1.2)

## 2015-12-27 NOTE — Progress Notes (Signed)
Call to Dr. Johney Frame office, spoke with [facility] regarding plavix schedule preop, b/c pt. & wife stated they were only given instruction relative to coumadin.  Chemira will fax the letter that they rec'd relative to the Plavix & coumadin to [facility]. She will also have Dr. Johney Frame nurse call the pt. & reinforce again the plan.

## 2015-12-27 NOTE — Progress Notes (Signed)
Pt. given instruction according to [facility], to take last dose of Warfarin on 12/29/2015. Pt.'s wife with him & she has the instruction in hand with her.

## 2015-12-28 NOTE — Progress Notes (Signed)
Anesthesia Chart Review:  Pt is a 60 year old male scheduled for laparoscopic L inguinal hernia repair with mesh on 01/04/2016 with Dr. Rosendo Gros.   Cardiologist is Dr. Dani Gobble Croitoru who has cleared pt for surgery.   PMH includes:  CAD (s/p CABG 2010; s/p DES to LM and LCx 2013), atrial fibrillation, HTN, tachy-brady syndrome, pacemaker (medtronic), atrial flutter, hyperlipidemia, heart murmur. Former smoker. BMI 32  Medications include: atenolol, plavix, diltiazem, tikosyn, lisinopril, protonix, potassium, pravastatin, coumadin. Pt to stop coumadin and plavix 5-7 days before surgery.   Preoperative labs reviewed.  PT will be obtained DOS.   Chest x-ray 10/12/15 reviewed. No edema or consolidation  EKG 07/29/15: NSR.   Echo 05/31/14:  - Left ventricle: The cavity size was normal. Wall thickness was increased in a pattern of mild LVH. Systolic function was normal. The estimated ejection fraction was in the range of 55% to 60%. - Mitral valve: There was mild regurgitation. - Left atrium: The atrium was mildly dilated.  Cardiac cath 10/17/11:  1. LM severely diseased throughout especially in its distal portion where there is an approximately 70% stenosis. 2. LAD is not seen during angiography of the left coronary artery the to a severe ostial stenosis.  3. cranial ramus intermedius 90% ostial stenosis that extends over a long portion of the proximal vessel; retrograde filling of a saphenous vein graft bypasses he 4. posterior ramus intermedius 90% ostial stenosis 5. LCx 70% ostial stenosis. There is evidence of moderate diffuse luminal irregularities and mild calcification throughout the remainder of the vessel. No other hemodynamically meaningful stenoses are seen. There may be a 50% stenosis just before the crux and the beginning of the PDA. But the areas difficult to visualize due to tortuosity and overlap. 6. RCA severe luminal irregularities and mild calcification. A. 70% stenosis is seen in  the mid vessel.  7. LIMA to mid LAD is a widely patent healthy vessel. 8. SVG to ramus intermedius is widely patent with minimal luminal irregularities. 9. SVG to first diagonal artery has mild luminal irregularities has excellent flow and feeds a relatively small vascular bed. 10. The radial artery bypass to the posterior descending artery is totally occluded. A short segment of vessel with an extremely thin lumen is seen. No distal flow is noted. Even after administration of 200 mcg of intra-arterial nitroglycerin there is no improvement  If no changes, I anticipate pt can proceed with surgery as scheduled.   Willeen Cass, FNP-BC [facility]/Anesthesiology Phone: [phone] 12/28/2015 9:58 AM

## 2015-12-29 NOTE — Progress Notes (Signed)
Spoke with device clinic and re-requested device programming orders to be faxed.  Stated that the doctor has them to sign.

## 2015-12-30 ENCOUNTER — Telehealth: Payer: Self-pay

## 2015-12-30 NOTE — Telephone Encounter (Signed)
Faxed perioperative prescription for implanted cardiac device programming to [facility].

## 2016-01-03 ENCOUNTER — Other Ambulatory Visit: Payer: Self-pay | Admitting: Family Medicine

## 2016-01-03 MED ORDER — CHLORHEXIDINE GLUCONATE 4 % EX LIQD: 1.0000 "application " | Freq: Once | CUTANEOUS | Status: DC

## 2016-01-03 MED ORDER — CEFAZOLIN SODIUM-DEXTROSE 2-4 GM/100ML-% IV SOLN
2.0000 g | INTRAVENOUS | Status: AC
Start: 2016-01-04 — End: 2016-01-04
  Administered 2016-01-04: 2 g via INTRAVENOUS
  Filled 2016-01-03: qty 100

## 2016-01-03 NOTE — Anesthesia Preprocedure Evaluation (Addendum)
Anesthesia Evaluation  Patient identified by MRN, date of birth, ID band Patient awake    Reviewed: Allergy & Precautions, H&P , NPO status , Patient's Chart, lab work & pertinent test results, reviewed documented beta blocker date and time   Airway Mallampati: III  TM Distance: >3 FB Neck ROM: Full    Dental no notable dental hx. (+) Teeth Intact, Dental Advisory Given   Pulmonary neg pulmonary ROS, former smoker,    Pulmonary exam normal breath sounds clear to auscultation       Cardiovascular hypertension, Pt. on medications and Pt. on home beta blockers + CAD and + Cardiac Stents  + dysrhythmias Atrial Fibrillation + pacemaker  Rhythm:Regular Rate:Normal     Neuro/Psych Anxiety Depression negative neurological ROS     GI/Hepatic Neg liver ROS, GERD  Medicated and Controlled,  Endo/Other  negative endocrine ROS  Renal/GU negative Renal ROS  negative genitourinary   Musculoskeletal  (+) Arthritis , Osteoarthritis,    Abdominal   Peds  Hematology negative hematology ROS (+)   Anesthesia Other Findings   Reproductive/Obstetrics negative OB ROS                            Anesthesia Physical Anesthesia Plan  ASA: III  Anesthesia Plan: General   Post-op Pain Management:    Induction: Intravenous  Airway Management Planned: Oral ETT  Additional Equipment:   Intra-op Plan:   Post-operative Plan: Extubation in OR  Informed Consent: I have reviewed the patients History and Physical, chart, labs and discussed the procedure including the risks, benefits and alternatives for the proposed anesthesia with the patient or authorized representative who has indicated his/her understanding and acceptance.   Dental advisory given  Plan Discussed with: CRNA  Anesthesia Plan Comments:         Anesthesia Quick Evaluation

## 2016-01-04 ENCOUNTER — Ambulatory Visit (HOSPITAL_COMMUNITY): Payer: BLUE CROSS/BLUE SHIELD | Admitting: Emergency Medicine

## 2016-01-04 ENCOUNTER — Encounter (HOSPITAL_COMMUNITY): Payer: Self-pay | Admitting: General Practice

## 2016-01-04 ENCOUNTER — Encounter (HOSPITAL_COMMUNITY)
Admission: RE | Disposition: A | Discharge status: Home / Self Care | Payer: Self-pay | Source: Ambulatory Visit | Attending: General Surgery

## 2016-01-04 ENCOUNTER — Ambulatory Visit (HOSPITAL_COMMUNITY)
Admission: RE | Admit: 2016-01-04 | Discharge: 2016-01-04 | Disposition: A | Discharge status: Home / Self Care | Payer: BLUE CROSS/BLUE SHIELD | Source: Ambulatory Visit | Attending: General Surgery | Admitting: General Surgery

## 2016-01-04 ENCOUNTER — Ambulatory Visit (HOSPITAL_COMMUNITY): Payer: BLUE CROSS/BLUE SHIELD | Admitting: Anesthesiology

## 2016-01-04 DIAGNOSIS — I2510 Atherosclerotic heart disease of native coronary artery without angina pectoris: Secondary | ICD-10-CM | POA: Diagnosis not present

## 2016-01-04 DIAGNOSIS — K4090 Unilateral inguinal hernia, without obstruction or gangrene, not specified as recurrent: Secondary | ICD-10-CM | POA: Insufficient documentation

## 2016-01-04 DIAGNOSIS — Z955 Presence of coronary angioplasty implant and graft: Secondary | ICD-10-CM | POA: Diagnosis not present

## 2016-01-04 DIAGNOSIS — Z87891 Personal history of nicotine dependence: Secondary | ICD-10-CM | POA: Insufficient documentation

## 2016-01-04 DIAGNOSIS — I10 Essential (primary) hypertension: Secondary | ICD-10-CM | POA: Diagnosis not present

## 2016-01-04 DIAGNOSIS — I4891 Unspecified atrial fibrillation: Secondary | ICD-10-CM | POA: Diagnosis not present

## 2016-01-04 HISTORY — PX: LAPAROSCOPIC LYSIS OF ADHESIONS: SHX5905

## 2016-01-04 HISTORY — PX: INGUINAL HERNIA REPAIR: SHX194

## 2016-01-04 HISTORY — PX: INSERTION OF MESH: SHX5868

## 2016-01-04 LAB — PROTIME-INR
INR: 1.06 (ref 0.00–1.49)
PROTHROMBIN TIME: 14 s (ref 11.6–15.2)

## 2016-01-04 SURGERY — REPAIR, HERNIA, INGUINAL, LAPAROSCOPIC
Anesthesia: General | Site: Abdomen | Laterality: Left

## 2016-01-04 MED ORDER — DEXAMETHASONE SODIUM PHOSPHATE 4 MG/ML IJ SOLN
INTRAMUSCULAR | Status: AC
  Filled 2016-01-04: qty 1

## 2016-01-04 MED ORDER — SUCCINYLCHOLINE CHLORIDE 20 MG/ML IJ SOLN
INTRAMUSCULAR | Status: AC
  Filled 2016-01-04: qty 1

## 2016-01-04 MED ORDER — DEXAMETHASONE SODIUM PHOSPHATE 4 MG/ML IJ SOLN
INTRAMUSCULAR | Status: DC | PRN
  Administered 2016-01-04: 8 mg via INTRAVENOUS

## 2016-01-04 MED ORDER — BUPIVACAINE HCL (PF) 0.25 % IJ SOLN
INTRAMUSCULAR | Status: AC
  Filled 2016-01-04: qty 30

## 2016-01-04 MED ORDER — ONDANSETRON HCL 4 MG/2ML IJ SOLN
INTRAMUSCULAR | Status: DC | PRN
  Administered 2016-01-04: 4 mg via INTRAVENOUS

## 2016-01-04 MED ORDER — PROPOFOL 10 MG/ML IV BOLUS
INTRAVENOUS | Status: AC
  Filled 2016-01-04: qty 20

## 2016-01-04 MED ORDER — ROCURONIUM BROMIDE 100 MG/10ML IV SOLN
INTRAVENOUS | Status: DC | PRN
  Administered 2016-01-04: 40 mg via INTRAVENOUS

## 2016-01-04 MED ORDER — LIDOCAINE HCL (CARDIAC) 20 MG/ML IV SOLN
INTRAVENOUS | Status: DC | PRN
  Administered 2016-01-04: 60 mg via INTRAVENOUS

## 2016-01-04 MED ORDER — OXYCODONE-ACETAMINOPHEN 5-325 MG PO TABS: 1.0000 | ORAL_TABLET | ORAL | Status: DC | PRN

## 2016-01-04 MED ORDER — FENTANYL CITRATE (PF) 100 MCG/2ML IJ SOLN
INTRAMUSCULAR | Status: DC | PRN
  Administered 2016-01-04 (×2): 100 ug via INTRAVENOUS

## 2016-01-04 MED ORDER — SUGAMMADEX SODIUM 200 MG/2ML IV SOLN
INTRAVENOUS | Status: DC | PRN
  Administered 2016-01-04: 250 mg via INTRAVENOUS

## 2016-01-04 MED ORDER — MIDAZOLAM HCL 5 MG/5ML IJ SOLN
INTRAMUSCULAR | Status: DC | PRN
  Administered 2016-01-04: 2 mg via INTRAVENOUS

## 2016-01-04 MED ORDER — ONDANSETRON HCL 4 MG/2ML IJ SOLN
INTRAMUSCULAR | Status: AC
  Filled 2016-01-04: qty 2

## 2016-01-04 MED ORDER — MIDAZOLAM HCL 2 MG/2ML IJ SOLN
INTRAMUSCULAR | Status: AC
  Filled 2016-01-04: qty 2

## 2016-01-04 MED ORDER — 0.9 % SODIUM CHLORIDE (POUR BTL) OPTIME
TOPICAL | Status: DC | PRN
  Administered 2016-01-04: 1000 mL

## 2016-01-04 MED ORDER — BUPIVACAINE HCL 0.25 % IJ SOLN
INTRAMUSCULAR | Status: DC | PRN
  Administered 2016-01-04: 5 mL

## 2016-01-04 MED ORDER — LIDOCAINE HCL (CARDIAC) 20 MG/ML IV SOLN
INTRAVENOUS | Status: AC
  Filled 2016-01-04: qty 5

## 2016-01-04 MED ORDER — LACTATED RINGERS IV SOLN
INTRAVENOUS | Status: DC | PRN
  Administered 2016-01-04 (×2): via INTRAVENOUS

## 2016-01-04 MED ORDER — HYDROMORPHONE HCL 1 MG/ML IJ SOLN
0.2500 mg | INTRAMUSCULAR | Status: DC | PRN
  Administered 2016-01-04 (×2): 0.5 mg via INTRAVENOUS

## 2016-01-04 MED ORDER — SUCCINYLCHOLINE CHLORIDE 20 MG/ML IJ SOLN
INTRAMUSCULAR | Status: DC | PRN
  Administered 2016-01-04: 100 mg via INTRAVENOUS

## 2016-01-04 MED ORDER — ROCURONIUM BROMIDE 50 MG/5ML IV SOLN
INTRAVENOUS | Status: AC
  Filled 2016-01-04: qty 1

## 2016-01-04 MED ORDER — SUGAMMADEX SODIUM 500 MG/5ML IV SOLN
INTRAVENOUS | Status: AC
  Filled 2016-01-04: qty 5

## 2016-01-04 MED ORDER — PHENYLEPHRINE HCL 10 MG/ML IJ SOLN
INTRAMUSCULAR | Status: DC | PRN
  Administered 2016-01-04: 80 ug via INTRAVENOUS

## 2016-01-04 MED ORDER — PROPOFOL 10 MG/ML IV BOLUS
INTRAVENOUS | Status: DC | PRN
  Administered 2016-01-04: 150 mg via INTRAVENOUS

## 2016-01-04 MED ORDER — FENTANYL CITRATE (PF) 250 MCG/5ML IJ SOLN
INTRAMUSCULAR | Status: AC
  Filled 2016-01-04: qty 5

## 2016-01-04 MED ORDER — HYDROMORPHONE HCL 1 MG/ML IJ SOLN
INTRAMUSCULAR | Status: AC
  Filled 2016-01-04: qty 1

## 2016-01-04 SURGICAL SUPPLY — 54 items
APL SKNCLS STERI-STRIP NONHPOA (GAUZE/BANDAGES/DRESSINGS) ×1
APPLIER CLIP 5 13 M/L LIGAMAX5 (MISCELLANEOUS)
APR CLP MED LRG 5 ANG JAW (MISCELLANEOUS)
BENZOIN TINCTURE PRP APPL 2/3 (GAUZE/BANDAGES/DRESSINGS) ×3 IMPLANT
CANISTER SUCTION 2500CC (MISCELLANEOUS) ×2 IMPLANT
CHLORAPREP W/TINT 26ML (MISCELLANEOUS) ×3 IMPLANT
CLIP APPLIE 5 13 M/L LIGAMAX5 (MISCELLANEOUS) IMPLANT
CLOSURE STERI-STRIP 1/2X4 (GAUZE/BANDAGES/DRESSINGS) ×1
CLOSURE WOUND 1/2 X4 (GAUZE/BANDAGES/DRESSINGS) ×1
CLSR STERI-STRIP ANTIMIC 1/2X4 (GAUZE/BANDAGES/DRESSINGS) ×1 IMPLANT
COVER SURGICAL LIGHT HANDLE (MISCELLANEOUS) ×3 IMPLANT
DISSECTOR BLUNT TIP ENDO 5MM (MISCELLANEOUS) IMPLANT
ELECT REM PT RETURN 9FT ADLT (ELECTROSURGICAL) ×3
ELECTRODE REM PT RTRN 9FT ADLT (ELECTROSURGICAL) ×1 IMPLANT
GAUZE SPONGE 2X2 8PLY STRL LF (GAUZE/BANDAGES/DRESSINGS) ×1 IMPLANT
GLOVE BIO SURGEON STRL SZ 6.5 (GLOVE) ×1 IMPLANT
GLOVE BIO SURGEON STRL SZ7 (GLOVE) ×2 IMPLANT
GLOVE BIO SURGEON STRL SZ7.5 (GLOVE) ×3 IMPLANT
GLOVE BIO SURGEONS STRL SZ 6.5 (GLOVE) ×1
GLOVE BIOGEL PI IND STRL 7.0 (GLOVE) IMPLANT
GLOVE BIOGEL PI INDICATOR 7.0 (GLOVE) ×2
GOWN STRL REUS W/ TWL LRG LVL3 (GOWN DISPOSABLE) ×2 IMPLANT
GOWN STRL REUS W/ TWL XL LVL3 (GOWN DISPOSABLE) ×1 IMPLANT
GOWN STRL REUS W/TWL LRG LVL3 (GOWN DISPOSABLE) ×6
GOWN STRL REUS W/TWL XL LVL3 (GOWN DISPOSABLE) ×3
KIT BASIN OR (CUSTOM PROCEDURE TRAY) ×3 IMPLANT
KIT ROOM TURNOVER OR (KITS) ×3 IMPLANT
MESH 3DMAX 4X6 LT LRG (Mesh General) ×2 IMPLANT
NDL INSUFFLATION 14GA 120MM (NEEDLE) IMPLANT
NEEDLE INSUFFLATION 14GA 120MM (NEEDLE) IMPLANT
NS IRRIG 1000ML POUR BTL (IV SOLUTION) ×3 IMPLANT
PAD ARMBOARD 7.5X6 YLW CONV (MISCELLANEOUS) ×6 IMPLANT
RELOAD STAPLE 4.0 BLU F/HERNIA (INSTRUMENTS) ×1 IMPLANT
RELOAD STAPLE 4.8 BLK F/HERNIA (STAPLE) IMPLANT
RELOAD STAPLE HERNIA 4.0 BLUE (INSTRUMENTS) ×6 IMPLANT
RELOAD STAPLE HERNIA 4.8 BLK (STAPLE) IMPLANT
SCISSORS LAP 5X35 DISP (ENDOMECHANICALS) ×3 IMPLANT
SET IRRIG TUBING LAPAROSCOPIC (IRRIGATION / IRRIGATOR) IMPLANT
SET TROCAR LAP APPLE-HUNT 5MM (ENDOMECHANICALS) ×3 IMPLANT
SPONGE GAUZE 2X2 STER 10/PKG (GAUZE/BANDAGES/DRESSINGS) ×2
STAPLER HERNIA 12 8.5 360D (INSTRUMENTS) ×3 IMPLANT
STRIP CLOSURE SKIN 1/2X4 (GAUZE/BANDAGES/DRESSINGS) ×2 IMPLANT
SUT MNCRL AB 4-0 PS2 18 (SUTURE) ×3 IMPLANT
SUT MON AB 5-0 PS2 18 (SUTURE) ×2 IMPLANT
SUT VIC AB 1 CT1 27 (SUTURE)
SUT VIC AB 1 CT1 27XBRD ANBCTR (SUTURE) IMPLANT
SYRINGE TOOMEY DISP (SYRINGE) ×3 IMPLANT
TOWEL OR 17X24 6PK STRL BLUE (TOWEL DISPOSABLE) ×3 IMPLANT
TOWEL OR 17X26 10 PK STRL BLUE (TOWEL DISPOSABLE) ×3 IMPLANT
TRAY FOLEY CATH 16FR SILVER (SET/KITS/TRAYS/PACK) ×3 IMPLANT
TRAY LAPAROSCOPIC MC (CUSTOM PROCEDURE TRAY) ×3 IMPLANT
TROCAR XCEL 12X100 BLDLESS (ENDOMECHANICALS) ×3 IMPLANT
TUBING INSUFFLATION (TUBING) ×3 IMPLANT
WATER STERILE IRR 1000ML POUR (IV SOLUTION) ×3 IMPLANT

## 2016-01-04 NOTE — Transfer of Care (Signed)
Immediate Anesthesia Transfer of Care Note  Patient: Rj Starzynski Adan  Procedure(s) Performed: Procedure(s): LAPAROSCOPIC LEFT INGUINAL HERNIA WITH MESH (Left) INSERTION OF MESH (Left) LAPAROSCOPIC LYSIS OF ADHESIONS (Left)  Patient Location: PACU  Anesthesia Type:General  Level of Consciousness: awake, alert , oriented and patient cooperative  Airway & Oxygen Therapy: Patient Spontanous Breathing and Patient connected to face mask oxygen  Post-op Assessment: Report given to RN, Post -op Vital signs reviewed and stable and Patient moving all extremities  Post vital signs: Reviewed and stable  Last Vitals:  Filed Vitals:   01/04/16 0640  BP: 138/76  Pulse: 66  Temp: 36.8 C  Resp: 20    Complications: No apparent anesthesia complications

## 2016-01-04 NOTE — Op Note (Signed)
01/04/2016  9:22 AM  PATIENT:  Lucas Porter  60 y.o. male  PRE-OPERATIVE DIAGNOSIS:  Left inguinal hernia  POST-OPERATIVE DIAGNOSIS:  Left indirect inguinal hernia  PROCEDURE:  Procedure(s): LAPAROSCOPIC LEFT INGUINAL HERNIA WITH MESH (Left) INSERTION OF MESH (Left)   SURGEON:  Surgeon(s) and Role:    * Ralene Ok, MD - Primary  ANESTHESIA:   local and general  EBL:<5cc   Total I/O In: 1000 [I.V.:1000] Out: -   BLOOD ADMINISTERED:none  DRAINS: none   LOCAL MEDICATIONS USED:  BUPIVICAINE   SPECIMEN:  No Specimen  DISPOSITION OF SPECIMEN:  N/A  COUNTS:  YES  TOURNIQUET:  * No tourniquets in log *  DICTATION: .Dragon Dictation   Counts: reported as correct x 2  Findings:  The patient had a moderated left indirect hernia  Indications for procedure:  The patient is a 60 year old male with a left hernia for several months. Patient complained of symptomatology to his left inguinal area. The patient was taken back for elective inguinal hernia repair.  Details of the procedure: The patient was taken back to the operating room. The patient was placed in supine position with bilateral SCDs in place.  The patient was prepped and draped in the usual sterile fashion.  After appropriate anitbiotics were confirmed, a time-out was confirmed and all facts were verified.  0.25% Marcaine was used to infiltrate the umbilical area. A 11-blade was used to cut down the skin and blunt dissection was used to get the anterior fashion.  The anterior fascia was incised approximately 1 cm and the muscles were retracted laterally. Blunt dissection was then used to create a space in the preperitoneal area. At this time a 10 mm camera was then introduced into the space and advanced the pubic tubercle and a 12 mm trocar was placed over this and insufflation was started.  At this time and space was created from medial to laterally the preperitoneal space.  Cooper's ligament was initially  cleaned off.  The hernia sac was identified in the indirect space. Dissection of the hernia sac was undertaken the vas deferens was identified and protected in all parts of the case.    Once the hernia sac was taken down to approximately the umbilicus a Bard 3D Max mesh, size: Large, was  introduced into the preperitoneal space.  The mesh was brought over to cover the direct and indirect hernia spaces.  This was anchored into place and secured to Cooper's ligament with 4.75mm staples from a Coviden hernia stapler. It was anchored to the anterior abdominal wall with 4.8 mm staples. The hernia sac was seen lying posterior to the mesh. There was no staples placed laterally. The insufflation was evacuated and the peritoneum was seen posterior to the mesh. The trochars were removed. The anterior fascia was reapproximated using #1 Vicryl on a UR- 6.  Intra-abdominal air was evacuated and the Veress needle removed. The skin was reapproximated using 4-0 Monocryl subcuticular fashion the patient was awakened from general anesthesia and taken to recovery in stable condition.   PLAN OF CARE: Discharge to home after PACU  PATIENT DISPOSITION:  PACU - hemodynamically stable.   Delay start of Pharmacological VTE agent (>24hrs) due to surgical blood loss or risk of bleeding: not applicable

## 2016-01-04 NOTE — Discharge Instructions (Signed)
[facility] _______[facility] ° °INGUINAL HERNIA REPAIR: POST OP INSTRUCTIONS ° °Always review your discharge instruction sheet given to you by the facility where your surgery was performed. °IF YOU HAVE DISABILITY OR FAMILY LEAVE FORMS, YOU MUST BRING THEM TO THE OFFICE FOR PROCESSING.   °DO NOT GIVE THEM TO YOUR DOCTOR. ° °1. A  prescription for pain medication may be given to you upon discharge.  Take your pain medication as prescribed, if needed.  If narcotic pain medicine is not needed, then you may take acetaminophen (Tylenol) or ibuprofen (Advil) as needed. °2. Take your usually prescribed medications unless otherwise directed. °3. If you need a refill on your pain medication, please contact your pharmacy.  They will contact our office to request authorization. Prescriptions will not be filled after 5 pm or on week-ends. °4. You should follow a light diet the first 24 hours after arrival home, such as soup and crackers, etc.  Be sure to include lots of fluids daily.  Resume your normal diet the day after surgery. °5. Most patients will experience some swelling and bruising around the umbilicus or in the groin and scrotum.  Ice packs and reclining will help.  Swelling and bruising can take several days to resolve.  °6. It is common to experience some constipation if taking pain medication after surgery.  Increasing fluid intake and taking a stool softener (such as Colace) will usually help or prevent this problem from occurring.  A mild laxative (Milk of Magnesia or Miralax) should be taken according to package directions if there are no bowel movements after 48 hours. °7. Unless discharge instructions indicate otherwise, you may remove your bandages 24-48 hours after surgery, and you may shower at that time.  You may have steri-strips (small skin tapes) in place directly over the incision.  These strips should be left on the skin for 7-10 days.  If your surgeon used skin glue on the incision, you  may shower in 24 hours.  The glue will flake off over the next 2-3 weeks.  Any sutures or staples will be removed at the office during your follow-up visit. °8. ACTIVITIES:  You may resume regular (light) daily activities beginning the next day--such as daily self-care, walking, climbing stairs--gradually increasing activities as tolerated.  You may have sexual intercourse when it is comfortable.  Refrain from any heavy lifting or straining until approved by your doctor. °a. You may drive when you are no longer taking prescription pain medication, you can comfortably wear a seatbelt, and you can safely maneuver your car and apply brakes. °b. RETURN TO WORK:  __________________________________________________________ °9. You should see your doctor in the office for a follow-up appointment approximately 2-3 weeks after your surgery.  Make sure that you call for this appointment within a day or two after you arrive home to insure a convenient appointment time. °10. OTHER INSTRUCTIONS:  __________________________________________________________________________________________________________________________________________________________________________________________  °WHEN TO CALL YOUR DOCTOR: °1. Fever over 101.0 °2. Inability to urinate °3. Nausea and/or vomiting °4. Extreme swelling or bruising °5. Continued bleeding from incision. °6. Increased pain, redness, or drainage from the incision ° °The clinic staff is available to answer your questions during regular business hours.  Please don’t hesitate to call and ask to speak to one of the nurses for clinical concerns.  If you have a medical emergency, go to the nearest emergency room or call 911.  A surgeon from [facility] is always on call at the hospital ° ° °[address], [address], [place], [place]  [address] ? ° [address] [address], [place], [place]   [address] °[phone] ? [phone] ? FAX [phone] °Web site:  [email or web address] ° °

## 2016-01-04 NOTE — Anesthesia Procedure Notes (Signed)
Procedure Name: Intubation Date/Time: 01/04/2016 8:33 AM Performed by: Rebekah Chesterfield L Pre-anesthesia Checklist: Patient identified, Emergency Drugs available, Suction available and Patient being monitored Patient Re-evaluated:Patient Re-evaluated prior to inductionOxygen Delivery Method: Circle System Utilized Preoxygenation: Pre-oxygenation with 100% oxygen Intubation Type: IV induction Ventilation: Mask ventilation with difficulty, Two handed mask ventilation required and Oral airway inserted - appropriate to patient size Laryngoscope Size: Mac and 4 Tube type: Oral Tube size: 7.5 mm Number of attempts: 1 Airway Equipment and Method: Stylet and Oral airway Placement Confirmation: ETT inserted through vocal cords under direct vision,  positive ETCO2 and breath sounds checked- equal and bilateral Secured at: 23 cm Tube secured with: Tape Dental Injury: Teeth and Oropharynx as per pre-operative assessment

## 2016-01-04 NOTE — Anesthesia Postprocedure Evaluation (Signed)
Anesthesia Post Note  Patient: Lucas Porter  Procedure(s) Performed: Procedure(s) (LRB): LAPAROSCOPIC LEFT INGUINAL HERNIA WITH MESH (Left) INSERTION OF MESH (Left) LAPAROSCOPIC LYSIS OF ADHESIONS (Left)  Patient location during evaluation: PACU Anesthesia Type: General Level of consciousness: awake and alert Pain management: pain level controlled Vital Signs Assessment: post-procedure vital signs reviewed and stable Respiratory status: spontaneous breathing, nonlabored ventilation, respiratory function stable and patient connected to nasal cannula oxygen Cardiovascular status: blood pressure returned to baseline and stable Postop Assessment: no signs of nausea or vomiting Anesthetic complications: no    Last Vitals:  Filed Vitals:   01/04/16 1049 01/04/16 1104  BP: 95/72 115/59  Pulse: 59 60  Temp:    Resp: 15 10    Last Pain:  Filed Vitals:   01/04/16 1106  PainSc: 7                  Seyed Heffley,W. EDMOND

## 2016-01-04 NOTE — H&P (Signed)
History of Present Illness Lucas Porter; 10/25/2015 2:52 PM) Patient words: hernia.  The patient is a 60 year old male who presents with an inguinal hernia. The patient is a 60 year old male who is referred by Dr. Charise Carwin for an evaluation of a left inguinal hernia. Patient states that approximately 1-2 months ago he was lifting a trailer tongue which caused a tearing sensation to the left inguinal area. He states that subsequent to that he notices a burning/painful. The left inguinal area. He states thereafter he is not a small bulge that side.  Patient has a history of previous cardiac bypass, stents, pacemaker placement. He sees Dr. Rayann Heman for his pacemaker and Dr.Croitoru is his cardiologist. The patient is currently on Coumadin for A. fib.   Other Problems (Ammie Eversole, LPN; [id number] [id number] PM) Anxiety Disorder Arthritis Atrial Fibrillation Back Pain Chest pain Depression High blood pressure Hypercholesterolemia  Past Surgical History (Ammie Eversole, LPN; X33443 [id number] PM) Coronary Artery Bypass Graft  Allergies (Ammie Eversole, LPN; X33443 QA348G PM) No Known Drug Allergies02/03/2016  Medication History (Ammie Eversole, LPN; X33443 075-GRM PM) Atenolol (100MG  Tablet, Oral) Active. Clopidogrel Bisulfate (75MG  Tablet, Oral) Active. DiltiaZEM HCl ER (180MG  Capsule ER 24HR, Oral) Active. Dofetilide (500MCG Capsule, Oral) Active. Lisinopril (20MG  Tablet, Oral) Active. Potassium Chloride Crys ER (10MEQ Tablet ER, Oral) Active. Pravastatin Sodium (80MG  Tablet, Oral) Active. Venlafaxine HCl ER (150MG  Capsule ER 24HR, Oral) Active. Warfarin Sodium (5MG  Tablet, Oral) Active. Hydrocodone-Acetaminophen (5-325MG  Tablet, Oral) Active. ClonazePAM (0.5MG  Tablet, Oral) Active. Fish Oil (1000MG  Capsule, Oral) Active. Nitrostat (0.4MG  Tab Sublingual, Sublingual) Active. Medications Reconciled  Social History (Aleatha Borer, LPN; X33443 D34-534  PM) Alcohol use Occasional alcohol use. Caffeine use Coffee, Tea. Illicit drug use Uses socially only. Tobacco use Never smoker.  Family History Aleatha Borer, LPN; X33443 [id number] PM) Arthritis Mother. Cerebrovascular Accident Brother. Diabetes Mellitus Mother. Heart Disease Father. Hypertension Father.    Review of Systems (Ammie Eversole LPN; X33443 D34-534 PM) General Present- Weight Gain. Not Present- Appetite Loss, Chills, Fatigue, Fever, Night Sweats and Weight Loss. Skin Not Present- Change in Wart/Mole, Dryness, Hives, Jaundice, New Lesions, Non-Healing Wounds, Rash and Ulcer. HEENT Present- Hearing Loss, Sore Throat and Wears glasses/contact lenses. Not Present- Earache, Hoarseness, Nose Bleed, Oral Ulcers, Ringing in the Ears, Seasonal Allergies, Sinus Pain, Visual Disturbances and Yellow Eyes. Respiratory Present- Snoring. Not Present- Bloody sputum, Chronic Cough, Difficulty Breathing and Wheezing. Breast Not Present- Breast Mass, Breast Pain, Nipple Discharge and Skin Changes. Cardiovascular Present- Rapid Heart Rate and Shortness of Breath. Not Present- Chest Pain, Difficulty Breathing Lying Down, Leg Cramps, Palpitations and Swelling of Extremities. Gastrointestinal Present- Abdominal Pain. Not Present- Bloating, Bloody Stool, Change in Bowel Habits, Chronic diarrhea, Constipation, Difficulty Swallowing, Excessive gas, Gets full quickly at meals, Hemorrhoids, Indigestion, Nausea, Rectal Pain and Vomiting. Male Genitourinary Present- Nocturia. Not Present- Blood in Urine, Change in Urinary Stream, Frequency, Impotence, Painful Urination, Urgency and Urine Leakage. Musculoskeletal Present- Back Pain, Joint Pain and Joint Stiffness. Not Present- Muscle Pain, Muscle Weakness and Swelling of Extremities. Neurological Present- Decreased Memory. Not Present- Fainting, Headaches, Numbness, Seizures, Tingling, Tremor, Trouble walking and Weakness. Psychiatric Present- Anxiety  and Depression. Not Present- Bipolar, Change in Sleep Pattern, Fearful and Frequent crying. Endocrine Not Present- Cold Intolerance, Excessive Hunger, Hair Changes, Heat Intolerance, Hot flashes and New Diabetes. Hematology Present- Easy Bruising and Excessive bleeding. Not Present- Gland problems, HIV and Persistent Infections.  BP 138/76 mmHg  Pulse 66  Temp(Src) 98.2 F (36.8 C) (Oral)  Resp  20  Ht 5\' 10"  (1.778 m)  Wt 115.667 kg (255 lb)  BMI 36.59 kg/m2  SpO2 98%   Physical Exam Lucas Porter; 10/25/2015 2:53 PM) General Mental Status-Alert. General Appearance-Consistent with stated age. Hydration-Well hydrated. Voice-Normal.  Head and Neck Head-normocephalic, atraumatic with no lesions or palpable masses. Trachea-midline.  Eye Eyeball - Bilateral-Extraocular movements intact. Sclera/Conjunctiva - Bilateral-No scleral icterus.  Chest and Lung Exam Chest and lung exam reveals -quiet, even and easy respiratory effort with no use of accessory muscles. Inspection Chest Wall - Normal. Back - normal.  Cardiovascular Cardiovascular examination reveals -normal heart sounds, regular rate and rhythm with no murmurs.  Abdomen Inspection Skin - Scar - no surgical scars. Hernias - Inguinal hernia - Left - Reducible(Small likely indirect). Palpation/Percussion Normal exam - Soft, Non Tender, No Rebound tenderness, No Rigidity (guarding) and No hepatosplenomegaly. Auscultation Normal exam - Bowel sounds normal.  Neurologic Neurologic evaluation reveals -alert and oriented x 3 with no impairment of recent or remote memory. Mental Status-Normal.  Musculoskeletal Normal Exam - Left-Upper Extremity Strength Normal and Lower Extremity Strength Normal. Normal Exam - Right-Upper Extremity Strength Normal, Lower Extremity Weakness.    Assessment & Plan Lucas Porter; 10/25/2015 2:53 PM) LEFT INGUINAL HERNIA (K40.90) Impression: Patient is a  60 year old male with a small likely indirect inguinal hernia. Patient has a long cardiac history.  1.We will  proceed to the operative room for laparoscopic left inguinal hernia repair with mesh. 2. All risks and benefits were discussed with the patient to generally include, but not limited to: infection, bleeding, damage to surrounding structures, acute and chronic nerve pain, and recurrence. Alternatives were offered and described. All questions were answered and the patient voiced understanding of the procedure and wishes to proceed at this point with hernia repair.

## 2016-01-05 ENCOUNTER — Encounter (HOSPITAL_COMMUNITY): Payer: Self-pay | Admitting: General Surgery

## 2016-01-13 ENCOUNTER — Ambulatory Visit (INDEPENDENT_AMBULATORY_CARE_PROVIDER_SITE_OTHER): Payer: BLUE CROSS/BLUE SHIELD | Admitting: Pharmacist

## 2016-01-13 DIAGNOSIS — I480 Paroxysmal atrial fibrillation: Secondary | ICD-10-CM | POA: Diagnosis not present

## 2016-01-13 DIAGNOSIS — Z7901 Long term (current) use of anticoagulants: Secondary | ICD-10-CM

## 2016-01-13 LAB — POCT INR: INR: 2

## 2016-02-02 ENCOUNTER — Other Ambulatory Visit: Payer: Self-pay | Admitting: Cardiovascular Disease

## 2016-02-02 NOTE — Telephone Encounter (Signed)
Rx request sent to pharmacy.  

## 2016-02-17 ENCOUNTER — Ambulatory Visit (INDEPENDENT_AMBULATORY_CARE_PROVIDER_SITE_OTHER): Payer: BLUE CROSS/BLUE SHIELD | Admitting: Pharmacist

## 2016-02-17 DIAGNOSIS — Z7901 Long term (current) use of anticoagulants: Secondary | ICD-10-CM

## 2016-02-17 DIAGNOSIS — I480 Paroxysmal atrial fibrillation: Secondary | ICD-10-CM

## 2016-02-17 LAB — POCT INR: INR: 3.3

## 2016-02-21 ENCOUNTER — Other Ambulatory Visit: Payer: Self-pay | Admitting: Cardiovascular Disease

## 2016-03-02 ENCOUNTER — Ambulatory Visit (INDEPENDENT_AMBULATORY_CARE_PROVIDER_SITE_OTHER): Payer: BLUE CROSS/BLUE SHIELD | Admitting: Pharmacist

## 2016-03-02 DIAGNOSIS — Z7901 Long term (current) use of anticoagulants: Secondary | ICD-10-CM

## 2016-03-02 DIAGNOSIS — I480 Paroxysmal atrial fibrillation: Secondary | ICD-10-CM

## 2016-03-02 LAB — POCT INR: INR: 2.3

## 2016-03-13 ENCOUNTER — Other Ambulatory Visit: Payer: Self-pay | Admitting: Cardiovascular Disease

## 2016-03-23 ENCOUNTER — Ambulatory Visit (INDEPENDENT_AMBULATORY_CARE_PROVIDER_SITE_OTHER): Payer: BLUE CROSS/BLUE SHIELD | Admitting: Pharmacist Clinician (PhC)/ Clinical Pharmacy Specialist

## 2016-03-23 DIAGNOSIS — Z7901 Long term (current) use of anticoagulants: Secondary | ICD-10-CM

## 2016-03-23 DIAGNOSIS — I480 Paroxysmal atrial fibrillation: Secondary | ICD-10-CM | POA: Diagnosis not present

## 2016-03-23 LAB — POCT INR: INR: 2.4

## 2016-05-02 ENCOUNTER — Other Ambulatory Visit: Payer: Self-pay | Admitting: Cardiovascular Disease

## 2016-05-02 NOTE — Telephone Encounter (Signed)
Rx(s) sent to pharmacy electronically.  

## 2016-05-04 ENCOUNTER — Ambulatory Visit (INDEPENDENT_AMBULATORY_CARE_PROVIDER_SITE_OTHER): Payer: BLUE CROSS/BLUE SHIELD | Admitting: Pharmacist

## 2016-05-04 ENCOUNTER — Other Ambulatory Visit: Payer: Self-pay | Admitting: Cardiovascular Disease

## 2016-05-04 DIAGNOSIS — I480 Paroxysmal atrial fibrillation: Secondary | ICD-10-CM | POA: Diagnosis not present

## 2016-05-04 DIAGNOSIS — Z7901 Long term (current) use of anticoagulants: Secondary | ICD-10-CM | POA: Diagnosis not present

## 2016-05-04 LAB — POCT INR: INR: 2.6

## 2016-05-04 NOTE — Telephone Encounter (Signed)
Rx request sent to pharmacy.  

## 2016-05-31 ENCOUNTER — Other Ambulatory Visit: Payer: Self-pay | Admitting: Cardiovascular Disease

## 2016-06-15 ENCOUNTER — Ambulatory Visit (INDEPENDENT_AMBULATORY_CARE_PROVIDER_SITE_OTHER): Payer: BLUE CROSS/BLUE SHIELD | Admitting: Pharmacist Clinician (PhC)/ Clinical Pharmacy Specialist

## 2016-06-15 DIAGNOSIS — I480 Paroxysmal atrial fibrillation: Secondary | ICD-10-CM

## 2016-06-15 DIAGNOSIS — Z7901 Long term (current) use of anticoagulants: Secondary | ICD-10-CM | POA: Diagnosis not present

## 2016-06-15 LAB — POCT INR: INR: 3.8

## 2016-06-29 ENCOUNTER — Other Ambulatory Visit: Payer: Self-pay | Admitting: Family Medicine

## 2016-06-29 ENCOUNTER — Other Ambulatory Visit: Payer: Self-pay | Admitting: Cardiovascular Disease

## 2016-07-02 ENCOUNTER — Ambulatory Visit (INDEPENDENT_AMBULATORY_CARE_PROVIDER_SITE_OTHER): Payer: BLUE CROSS/BLUE SHIELD | Admitting: Pharmacist

## 2016-07-02 ENCOUNTER — Other Ambulatory Visit: Payer: Self-pay | Admitting: Cardiovascular Disease

## 2016-07-02 ENCOUNTER — Other Ambulatory Visit: Payer: Self-pay | Admitting: Emergency Medicine

## 2016-07-02 DIAGNOSIS — I480 Paroxysmal atrial fibrillation: Secondary | ICD-10-CM | POA: Diagnosis not present

## 2016-07-02 DIAGNOSIS — Z7901 Long term (current) use of anticoagulants: Secondary | ICD-10-CM | POA: Diagnosis not present

## 2016-07-02 LAB — POCT INR: INR: 3

## 2016-07-02 MED ORDER — VENLAFAXINE HCL ER 75 MG PO CP24: ORAL_CAPSULE | ORAL | 0 refills | Status: DC

## 2016-07-02 NOTE — Telephone Encounter (Signed)
Rx(s) sent to pharmacy electronically.  

## 2016-07-03 ENCOUNTER — Other Ambulatory Visit: Payer: Self-pay

## 2016-07-03 MED ORDER — DOFETILIDE 500 MCG PO CAPS: 500.0000 ug | ORAL_CAPSULE | Freq: Two times a day (BID) | ORAL | 0 refills | Status: DC

## 2016-07-04 ENCOUNTER — Other Ambulatory Visit: Payer: Self-pay | Admitting: Cardiovascular Disease

## 2016-07-04 ENCOUNTER — Other Ambulatory Visit: Payer: Self-pay | Admitting: Family Medicine

## 2016-07-05 ENCOUNTER — Other Ambulatory Visit: Payer: Self-pay | Admitting: Emergency Medicine

## 2016-07-05 NOTE — Telephone Encounter (Signed)
REFILL 

## 2016-07-06 ENCOUNTER — Other Ambulatory Visit: Payer: Self-pay | Admitting: Family Medicine

## 2016-07-11 ENCOUNTER — Ambulatory Visit (INDEPENDENT_AMBULATORY_CARE_PROVIDER_SITE_OTHER): Payer: Medicare Other | Admitting: Physician Assistant

## 2016-07-11 VITALS — BP 120/74 | HR 79 | Temp 98.1°F | Resp 17 | Ht 70.0 in | Wt 235.0 lb

## 2016-07-11 DIAGNOSIS — R5382 Chronic fatigue, unspecified: Secondary | ICD-10-CM | POA: Diagnosis not present

## 2016-07-11 DIAGNOSIS — E291 Testicular hypofunction: Secondary | ICD-10-CM | POA: Diagnosis not present

## 2016-07-11 DIAGNOSIS — Z23 Encounter for immunization: Secondary | ICD-10-CM

## 2016-07-11 DIAGNOSIS — F419 Anxiety disorder, unspecified: Secondary | ICD-10-CM | POA: Diagnosis not present

## 2016-07-11 DIAGNOSIS — Z1159 Encounter for screening for other viral diseases: Secondary | ICD-10-CM

## 2016-07-11 DIAGNOSIS — Z114 Encounter for screening for human immunodeficiency virus [HIV]: Secondary | ICD-10-CM

## 2016-07-11 LAB — CBC WITH DIFFERENTIAL/PLATELET
BASOS PCT: 0 %
Basophils Absolute: 0 cells/uL (ref 0–200)
EOS PCT: 2 %
Eosinophils Absolute: 148 cells/uL (ref 15–500)
HCT: 41.5 % (ref 38.5–50.0)
Hemoglobin: 14.4 g/dL (ref 13.2–17.1)
LYMPHS PCT: 22 %
Lymphs Abs: 1628 cells/uL (ref 850–3900)
MCH: 29.8 pg (ref 27.0–33.0)
MCHC: 34.7 g/dL (ref 32.0–36.0)
MCV: 85.9 fL (ref 80.0–100.0)
MONO ABS: 370 {cells}/uL (ref 200–950)
MONOS PCT: 5 %
MPV: 10 fL (ref 7.5–12.5)
Neutro Abs: 5254 cells/uL (ref 1500–7800)
Neutrophils Relative %: 71 %
PLATELETS: 178 10*3/uL (ref 140–400)
RBC: 4.83 MIL/uL (ref 4.20–5.80)
RDW: 14.5 % (ref 11.0–15.0)
WBC: 7.4 10*3/uL (ref 3.8–10.8)

## 2016-07-11 LAB — COMPREHENSIVE METABOLIC PANEL
ALK PHOS: 82 U/L (ref 40–115)
ALT: 25 U/L (ref 9–46)
AST: 27 U/L (ref 10–35)
Albumin: 4 g/dL (ref 3.6–5.1)
BILIRUBIN TOTAL: 0.5 mg/dL (ref 0.2–1.2)
BUN: 14 mg/dL (ref 7–25)
CO2: 24 mmol/L (ref 20–31)
Calcium: 9.1 mg/dL (ref 8.6–10.3)
Chloride: 104 mmol/L (ref 98–110)
Creat: 1.02 mg/dL (ref 0.70–1.25)
GLUCOSE: 155 mg/dL — AB (ref 65–99)
Potassium: 4 mmol/L (ref 3.5–5.3)
SODIUM: 139 mmol/L (ref 135–146)
Total Protein: 6.4 g/dL (ref 6.1–8.1)

## 2016-07-11 LAB — TSH: TSH: 2.73 mIU/L (ref 0.40–4.50)

## 2016-07-11 MED ORDER — VENLAFAXINE HCL ER 75 MG PO CP24: ORAL_CAPSULE | ORAL | 3 refills | Status: DC

## 2016-07-11 MED ORDER — VENLAFAXINE HCL ER 150 MG PO CP24: ORAL_CAPSULE | ORAL | 3 refills | Status: DC

## 2016-07-11 NOTE — Patient Instructions (Addendum)
   IF you received an x-ray today, you will receive an invoice from [facility]. Please contact [facility] at [phone] with questions or concerns regarding your invoice.   IF you received labwork today, you will receive an invoice from [facility]. Please contact [facility] at [phone] with questions or concerns regarding your invoice.   Our billing staff will not be able to assist you with questions regarding bills from these companies.  You will be contacted with the lab results as soon as they are available. The fastest way to get your results is to activate your My Chart account. Instructions are located on the last page of this paperwork. If you have not heard from us regarding the results in 2 weeks, please contact this office.    Influenza (Flu) Vaccine (Inactivated or Recombinant):  1. Why get vaccinated? Influenza ("flu") is a contagious disease that spreads around the [place] every year, usually between October and May. Flu is caused by influenza viruses, and is spread mainly by coughing, sneezing, and close contact. Anyone can get flu. Flu strikes suddenly and can last several days. Symptoms vary by age, but can include:  fever/chills  sore throat  muscle aches  fatigue  cough  headache  runny or stuffy nose Flu can also lead to pneumonia and blood infections, and cause diarrhea and seizures in children. If you have a medical condition, such as heart or lung disease, flu can make it worse. Flu is more dangerous for some people. Infants and young children, people 65 years of age and older, pregnant women, and people with certain health conditions or a weakened immune system are at greatest risk. Each year thousands of people in the [place] die from flu, and many more are hospitalized. Flu vaccine can:  keep you from getting flu,  make flu less severe if you do get it, and  keep you from spreading flu to  your family and other people. 2. Inactivated and recombinant flu vaccines A dose of flu vaccine is recommended every flu season. Children 6 months through 8 years of age may need two doses during the same flu season. Everyone else needs only one dose each flu season. Some inactivated flu vaccines contain a very small amount of a mercury-based preservative called thimerosal. Studies have not shown thimerosal in vaccines to be harmful, but flu vaccines that do not contain thimerosal are available. There is no live flu virus in flu shots. They cannot cause the flu. There are many flu viruses, and they are always changing. Each year a new flu vaccine is made to protect against three or four viruses that are likely to cause disease in the upcoming flu season. But even when the vaccine doesn't exactly match these viruses, it may still provide some protection. Flu vaccine cannot prevent:  flu that is caused by a virus not covered by the vaccine, or  illnesses that look like flu but are not. It takes about 2 weeks for protection to develop after vaccination, and protection lasts through the flu season. 3. Some people should not get this vaccine Tell the person who is giving you the vaccine:  If you have any severe, life-threatening allergies. If you ever had a life-threatening allergic reaction after a dose of flu vaccine, or have a severe allergy to any part of this vaccine, you may be advised not to get vaccinated. Most, but not all, types of flu vaccine contain a small amount of egg protein.    If you ever had Guillain-Barre Syndrome (also called GBS). Some people with a history of GBS should not get this vaccine. This should be discussed with your doctor.  If you are not feeling well. It is usually okay to get flu vaccine when you have a mild illness, but you might be asked to come back when you feel better. 4. Risks of a vaccine reaction With any medicine, including vaccines, there is a chance of  reactions. These are usually mild and go away on their own, but serious reactions are also possible. Most people who get a flu shot do not have any problems with it. Minor problems following a flu shot include:  soreness, redness, or swelling where the shot was given  hoarseness  sore, red or itchy eyes  cough  fever  aches  headache  itching  fatigue If these problems occur, they usually begin soon after the shot and last 1 or 2 days. More serious problems following a flu shot can include the following:  There may be a small increased risk of Guillain-Barre Syndrome (GBS) after inactivated flu vaccine. This risk has been estimated at 1 or 2 additional cases per million people vaccinated. This is much lower than the risk of severe complications from flu, which can be prevented by flu vaccine.  Young children who get the flu shot along with pneumococcal vaccine (PCV13) and/or DTaP vaccine at the same time might be slightly more likely to have a seizure caused by fever. Ask your doctor for more information. Tell your doctor if a child who is getting flu vaccine has ever had a seizure. Problems that could happen after any injected vaccine:  People sometimes faint after a medical procedure, including vaccination. Sitting or lying down for about 15 minutes can help prevent fainting, and injuries caused by a fall. Tell your doctor if you feel dizzy, or have vision changes or ringing in the ears.  Some people get severe pain in the shoulder and have difficulty moving the arm where a shot was given. This happens very rarely.  Any medication can cause a severe allergic reaction. Such reactions from a vaccine are very rare, estimated at about 1 in a million doses, and would happen within a few minutes to a few hours after the vaccination. As with any medicine, there is a very remote chance of a vaccine causing a serious injury or death. The safety of vaccines is always being monitored. For  more information, visit: www.cdc.gov/vaccinesafety/ 5. What if there is a serious reaction? What should I look for?  Look for anything that concerns you, such as signs of a severe allergic reaction, very high fever, or unusual behavior. Signs of a severe allergic reaction can include hives, swelling of the face and throat, difficulty breathing, a fast heartbeat, dizziness, and weakness. These would start a few minutes to a few hours after the vaccination. What should I do?  If you think it is a severe allergic reaction or other emergency that can't wait, call 9-1-1 and get the person to the nearest hospital. Otherwise, call your doctor.  Reactions should be reported to the Vaccine Adverse Event Reporting System (VAERS). Your doctor should file this report, or you can do it yourself through the VAERS web site at www.vaers.hhs.gov, or by calling 1-800-822-7967. VAERS does not give medical advice. 6. The National Vaccine Injury Compensation Program The National Vaccine Injury Compensation Program (VICP) is a federal program that was created to compensate people who may have been   injured by certain vaccines. Persons who believe they may have been injured by a vaccine can learn about the program and about filing a claim by calling 1-800-338-2382 or visiting the VICP website at www.hrsa.gov/vaccinecompensation. There is a time limit to file a claim for compensation. 7. How can I learn more?  Ask your healthcare provider. He or she can give you the vaccine package insert or suggest other sources of information.  Call your local or state health department.  Contact the Centers for Disease Control and Prevention (CDC):  Call 1-800-232-4636 (1-800-CDC-INFO) or  Visit CDC's website at www.cdc.gov/flu Vaccine Information Statement Inactivated Influenza Vaccine (04/23/2014)   This information is not intended to replace advice given to you by your health care provider. Make sure you discuss any  questions you have with your health care provider.   Document Released: 06/28/2006 Document Revised: 09/24/2014 Document Reviewed: 04/26/2014 Elsevier Interactive Patient Education 2016 [employer]  

## 2016-07-11 NOTE — Progress Notes (Signed)
Patient ID: Lucas Porter, male    DOB: 09-20-1955, 60 y.o.   MRN: [id number]  PCP: Kennon Portela, MD  Subjective:   Chief Complaint  Patient presents with  . Medication Refill    effexor    HPI Presents for refill of venlafaxine xr. He is accompanied by his wife.  He was managed by Dr. Lorelei Pont and Dr. Marin Comment, both of whom have left this practice.  Effexor is working well to control his anxiety, though some days it's hard to get motivated to do things, so he just sits and watches TV. Doesn't have as much patience as he used to have, gets "aggravated pretty bad, pretty easily." Wonders if there is something he can take to help him have more motivation.  Medications reviewed for potential contributors, which include: Atenolol 100 mg QD Clonazepam PRN (very rarely) Diltiazem XR 180 mg QD Hydrocodone and oxycodone PRN (very rarely)  Chart reviewed for other diagnoses/labs which may shed light on the fatigue: CAD AFib AFlutter Symptomatic Bradycardia Obesity Hypogonadism: testosterone 102 in 11/2014, 126 in 03/2014, 394 in 2014. Tried a topical testosterone gel, d/c'd due to cost. TSH normal in 2015 CBC normal in 12/2015   Review of Systems As above. No CP, SOB, HA, dizziness. No new/unexplained muscle or joint pain.    Patient Active Problem List   Diagnosis Date Noted  . Morbid obesity ([place]) 10/12/2015  . 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 Symptomatic bradycardia 03/09/2013  . Long term (current) use of anticoagulants 12/03/2012  . BMI 40.0-44.9, adult ([place]) 05/13/2012  . Atrial flutter ([place]) 04/24/2012  . S/P coronary artery stent placement to distal Lt. main and Lt. Cx, 10/17/11 10/19/2011  . Angina pectoris 10/19/2011  . Coagulopathy ([place]) 10/19/2011  . History of pacemaker, with a-v pacing 10/19/2011  . CAD (coronary artery disease) with CABG 2011, LIMA to LAD, Free radial to Lt. PDA, VG to Diag and ramus intermedius 10/02/2011  . Anxiety 10/02/2011    . Paroxysmal atrial fibrillation ([place]) 10/02/2011  . Hypertension 10/02/2011  . Dyslipidemia 10/02/2011  . Hypogonadism male 10/02/2011     Prior to Admission medications   Medication Sig Start Date End Date Taking? Authorizing Provider  atenolol (TENORMIN) 100 MG tablet TAKE ONE TABLET BY MOUTH ONCE DAILY Patient taking differently: TAKE ONE TABLET BY MOUTH ONCE DAILY   takes in a.m. 12/26/15  Yes Gay Filler Copland, MD  baclofen (LIORESAL) 10 MG tablet Take 1/2 to 1 tab po TID prn for muscle spasms, may sedate so do not take with clonazepam Patient taking differently: Take 5-10 mg by mouth 3 (three) times daily as needed. Take 1/2 to 1 tab po TID prn for muscle spasms, may sedate so do not take with clonazepam 04/28/15  Yes Thao P Le, DO  clonazePAM (KLONOPIN) 0.5 MG tablet Take 1 tablet (0.5 mg total) by mouth 2 (two) times daily as needed for anxiety. 12/13/14  Yes Gay Filler Copland, MD  clopidogrel (PLAVIX) 75 MG tablet TAKE ONE TABLET BY MOUTH ONCE DAILY 08/08/15  Yes Mihai Croitoru, MD  diltiazem (DILACOR XR) 180 MG 24 hr capsule Take 1 capsule (180 mg total) by mouth daily. NEED OV. 07/05/16  Yes Mihai Croitoru, MD  dofetilide (TIKOSYN) 500 MCG capsule Take 1 capsule (500 mcg total) by mouth 2 (two) times daily. NEEDS APPOINTMENT FOR FUTURE REFILLS 07/03/16  Yes Mihai Croitoru, MD  fish oil-omega-3 fatty acids 1000 MG capsule Take 2 g by mouth daily.    Yes Historical  Provider, MD  HYDROcodone-acetaminophen (NORCO) 5-325 MG per tablet Take 1 tablet by mouth every 6 (six) hours as needed for moderate pain. Take with stool softener, may cause constipation and drowsiness, do not take extra tylenol. 04/28/15  Yes Thao P Le, DO  hydroxypropyl methylcellulose / hypromellose (ISOPTO TEARS / GONIOVISC) 2.5 % ophthalmic solution Place 1 drop into both eyes as needed for dry eyes.   Yes Historical Provider, MD  lisinopril (PRINIVIL,ZESTRIL) 20 MG tablet Take 1 tablet (20 mg total) by mouth daily.  10/12/15  Yes Gay Filler Copland, MD  nitroGLYCERIN (NITROSTAT) 0.4 MG SL tablet Place 1 tablet (0.4 mg total) under the tongue every 5 (five) minutes as needed for chest pain. 05/01/13  Yes Thompson Grayer, MD  oxyCODONE-acetaminophen (ROXICET) 5-325 MG tablet Take 1-2 tablets by mouth every 4 (four) hours as needed. 01/04/16  Yes Ralene Ok, MD  pantoprazole (PROTONIX) 40 MG tablet Take 1 tablet (40 mg total) by mouth daily. 06/19/13  Yes Thompson Grayer, MD  potassium chloride (K-DUR) 10 MEQ tablet TAKE ONE TABLET BY MOUTH ONCE DAILY 03/13/16  Yes Mihai Croitoru, MD  pravastatin (PRAVACHOL) 80 MG tablet TAKE ONE TABLET BY MOUTH ONCE DAILY. Patient taking differently: Take 80 mg by mouth daily. TAKE ONE TABLET BY MOUTH ONCE DAILY. 10/12/15  Yes Gay Filler Copland, MD  venlafaxine XR (EFFEXOR-XR) 150 MG 24 hr capsule TAKE ONE CAPSULE BY MOUTH ONCE DAILY WITH  THE  75  MG  TO  TOTAL  225  MG  DAILY 01/04/16  Yes Jessica C Copland, MD  venlafaxine XR (EFFEXOR-XR) 75 MG 24 hr capsule TAKE ONE CAPSULE BY MOUTH ONCE DAILY TAKE  WITH  150  MG  TO  EQUAL  A  TOTAL  OF  225  MG  DAILY 07/02/16  Yes Jessica C Copland, MD  warfarin (COUMADIN) 5 MG tablet TAKE ONE TABLET BY MOUTH ONCE DAILY OR  AS DIRECTED BY COUMADIN CLINIC 06/29/16  Yes Mihai Croitoru, MD  fluticasone (FLONASE) 50 MCG/ACT nasal spray  06/18/16   Historical Provider, MD     No Known Allergies     Objective:  Physical Exam  Constitutional: He is oriented to person, place, and time. He appears well-developed and well-nourished. He is active and cooperative. No distress.  BP 120/74 (BP Location: Right Arm, Patient Position: Sitting, Cuff Size: Large)   Pulse 79   Temp 98.1 F (36.7 C) (Oral)   Resp 17   Ht 5\' 10"  (1.778 m)   Wt 235 lb (106.6 kg)   SpO2 97%   BMI 33.72 kg/m   HENT:  Head: Normocephalic and atraumatic.  Right Ear: Hearing normal.  Left Ear: Hearing normal.  Eyes: Conjunctivae are normal. No scleral icterus.  Neck: Normal  range of motion. Neck supple. No thyromegaly present.  Cardiovascular: Normal rate, regular rhythm and normal heart sounds.   Pulses:      Radial pulses are 2+ on the right side, and 2+ on the left side.  Pulmonary/Chest: Effort normal and breath sounds normal.  Lymphadenopathy:       Head (right side): No tonsillar, no preauricular, no posterior auricular and no occipital adenopathy present.       Head (left side): No tonsillar, no preauricular, no posterior auricular and no occipital adenopathy present.    He has no cervical adenopathy.       Right: No supraclavicular adenopathy present.       Left: No supraclavicular adenopathy present.  Neurological: He is alert  and oriented to person, place, and time. No sensory deficit.  Skin: Skin is warm, dry and intact. No rash noted. No cyanosis or erythema. Nails show no clubbing.  Psychiatric: He has a normal mood and affect. His speech is normal and behavior is normal.           Assessment & Plan:   1. Anxiety Reasonable control. Continue current treatment. - venlafaxine XR (EFFEXOR-XR) 75 MG 24 hr capsule; TAKE ONE CAPSULE BY MOUTH ONCE DAILY TAKE  WITH  150  MG  TO  EQUAL  A  TOTAL  OF  225  MG  DAILY  Dispense: 90 capsule; Refill: 3 - venlafaxine XR (EFFEXOR-XR) 150 MG 24 hr capsule; TAKE ONE CAPSULE BY MOUTH ONCE DAILY WITH  THE  75  MG  TO  TOTAL  225  MG  DAILY  Dispense: 90 capsule; Refill: 3  2. Needs flu shot - Flu Vaccine QUAD 36+ mos IM  3. Chronic fatigue Await labs. Suspect this is a combination of incomplete control of anxiety, untreated hypogonadism, depression related to CAD. Possibly the BB and CCB are contributing, and he can discuss alternatives with his cardiologist. Presuming that the testosterone level remains low, consider resumption of topical testosterone therapy. - CBC with Differential/Platelet - Comprehensive metabolic panel - TSH - Testosterone  4. Screening for HIV (human immunodeficiency virus) - HIV  antibody  5. Need for hepatitis C screening test - Hepatitis C antibody  Return in about 6 months (around 01/09/2017).    Fara Chute, PA-C Physician Assistant-Certified [facility]

## 2016-07-12 LAB — TESTOSTERONE: Testosterone: 124 ng/dL — ABNORMAL LOW (ref 250–827)

## 2016-07-12 LAB — HIV ANTIBODY (ROUTINE TESTING W REFLEX): HIV 1&2 Ab, 4th Generation: NONREACTIVE

## 2016-07-12 LAB — HEPATITIS C ANTIBODY: HCV Ab: NEGATIVE

## 2016-07-17 NOTE — Addendum Note (Signed)
Addended by: Harrison Mons S on: 07/17/2016 11:11 AM   Modules accepted: Orders

## 2016-07-18 ENCOUNTER — Other Ambulatory Visit: Payer: Self-pay | Admitting: Cardiovascular Disease

## 2016-07-18 NOTE — Telephone Encounter (Signed)
Rx(s) sent to pharmacy electronically.  

## 2016-07-19 ENCOUNTER — Other Ambulatory Visit: Payer: Self-pay | Admitting: Emergency Medicine

## 2016-07-19 ENCOUNTER — Other Ambulatory Visit: Payer: Self-pay

## 2016-07-19 ENCOUNTER — Other Ambulatory Visit: Payer: Self-pay | Admitting: Physician Assistant

## 2016-07-19 DIAGNOSIS — R55 Syncope and collapse: Secondary | ICD-10-CM

## 2016-07-19 DIAGNOSIS — E291 Testicular hypofunction: Secondary | ICD-10-CM

## 2016-07-23 ENCOUNTER — Ambulatory Visit (INDEPENDENT_AMBULATORY_CARE_PROVIDER_SITE_OTHER): Payer: BLUE CROSS/BLUE SHIELD | Admitting: Pharmacist Clinician (PhC)/ Clinical Pharmacy Specialist

## 2016-07-23 DIAGNOSIS — I480 Paroxysmal atrial fibrillation: Secondary | ICD-10-CM | POA: Diagnosis not present

## 2016-07-23 DIAGNOSIS — Z7901 Long term (current) use of anticoagulants: Secondary | ICD-10-CM

## 2016-07-23 LAB — POCT INR: INR: 3.3

## 2016-07-31 ENCOUNTER — Telehealth: Payer: Self-pay

## 2016-07-31 NOTE — Telephone Encounter (Signed)
THIS CALL IS FROM FABY AT [facility].  [name] ORDERED PATIENT TO HAVE A BRAIN MRI WITH AND WITHOUT CONTRAST. DURING THE PATIENT QUESTIONNAIRE THE PATIENT'S WIFE INFORMED THEM THAT Lucas Porter HAS A PACEMAKER. THEREFORE, [facility] CAN NOT PERFORM THE TEST. BEST PHONE IF QUESTIONS: [phone] - ASK FOR FABY.  [facility]

## 2016-08-02 ENCOUNTER — Telehealth: Payer: Self-pay | Admitting: Cardiovascular Disease

## 2016-08-02 NOTE — Telephone Encounter (Signed)
Please call Faby back. What does they recommend we do to assess for a pituitary adenoma, since he can't have the MRI?

## 2016-08-02 NOTE — Telephone Encounter (Signed)
[name], see below.

## 2016-08-02 NOTE — Telephone Encounter (Signed)
Called back again x 4 (kept getting kicked out of hold queue). Finally reached someone who clarified that [facility] MRI machine is too strong to be safe for pt with pacemaker, but [facility] is supposed to have a machine that can safely perform an MRI for pt with pacemaker.   Referrals, can you please try to schedule this at [facility] and make sure they are aware pt has pacemaker?  [name], FYI

## 2016-08-02 NOTE — Telephone Encounter (Signed)
Tried to call Faby x 2 and she was not available. Will try again a little later, if not have her call back.

## 2016-08-02 NOTE — Telephone Encounter (Signed)
Need to be sent to [facility]. Thank you

## 2016-08-02 NOTE — Telephone Encounter (Signed)
Pt made appt with Dr Loletha Grayer 09-24-16, needs refill of Tikosyn and Klor-con

## 2016-08-06 ENCOUNTER — Ambulatory Visit (INDEPENDENT_AMBULATORY_CARE_PROVIDER_SITE_OTHER): Payer: BLUE CROSS/BLUE SHIELD | Admitting: Pharmacist

## 2016-08-06 ENCOUNTER — Other Ambulatory Visit: Payer: Self-pay

## 2016-08-06 DIAGNOSIS — Z7901 Long term (current) use of anticoagulants: Secondary | ICD-10-CM

## 2016-08-06 DIAGNOSIS — I480 Paroxysmal atrial fibrillation: Secondary | ICD-10-CM | POA: Diagnosis not present

## 2016-08-06 LAB — POCT INR: INR: 1.8

## 2016-08-06 MED ORDER — POTASSIUM CHLORIDE CRYS ER 10 MEQ PO TBCR: 10.0000 meq | EXTENDED_RELEASE_TABLET | Freq: Every day | ORAL | 0 refills | Status: DC

## 2016-08-06 MED ORDER — DOFETILIDE 500 MCG PO CAPS: 500.0000 ug | ORAL_CAPSULE | Freq: Two times a day (BID) | ORAL | 1 refills | Status: DC

## 2016-08-06 NOTE — Telephone Encounter (Signed)
Rx has been sent to the pharmacy electronically. ° °

## 2016-08-07 ENCOUNTER — Other Ambulatory Visit: Payer: Self-pay | Admitting: Cardiovascular Disease

## 2016-08-07 MED ORDER — POTASSIUM CHLORIDE CRYS ER 10 MEQ PO TBCR: 10.0000 meq | EXTENDED_RELEASE_TABLET | Freq: Every day | ORAL | 0 refills | Status: DC

## 2016-08-07 NOTE — Telephone Encounter (Signed)
Rx has been sent to the pharmacy electronically. ° °

## 2016-08-07 NOTE — Telephone Encounter (Signed)
They can not refill her Klor-Con,it have to be sent to a local [facility].

## 2016-08-07 NOTE — Telephone Encounter (Signed)
Spoke with scheduler at [facility]. States that she will look into the type of pacemaker the pt has to see if it is compatible with their machine.

## 2016-08-20 ENCOUNTER — Ambulatory Visit (INDEPENDENT_AMBULATORY_CARE_PROVIDER_SITE_OTHER): Payer: BLUE CROSS/BLUE SHIELD | Admitting: Pharmacist Clinician (PhC)/ Clinical Pharmacy Specialist

## 2016-08-20 DIAGNOSIS — Z7901 Long term (current) use of anticoagulants: Secondary | ICD-10-CM

## 2016-08-20 DIAGNOSIS — I480 Paroxysmal atrial fibrillation: Secondary | ICD-10-CM | POA: Diagnosis not present

## 2016-08-20 LAB — POCT INR: INR: 2.2

## 2016-08-22 ENCOUNTER — Other Ambulatory Visit: Payer: Self-pay | Admitting: Cardiovascular Disease

## 2016-08-22 NOTE — Telephone Encounter (Signed)
Rx(s) sent to pharmacy electronically.  

## 2016-09-12 ENCOUNTER — Ambulatory Visit (INDEPENDENT_AMBULATORY_CARE_PROVIDER_SITE_OTHER): Payer: BLUE CROSS/BLUE SHIELD | Admitting: Pharmacist

## 2016-09-12 DIAGNOSIS — Z7901 Long term (current) use of anticoagulants: Secondary | ICD-10-CM | POA: Diagnosis not present

## 2016-09-12 DIAGNOSIS — I480 Paroxysmal atrial fibrillation: Secondary | ICD-10-CM | POA: Diagnosis not present

## 2016-09-12 LAB — POCT INR: INR: 2.9

## 2016-09-24 ENCOUNTER — Encounter: Payer: Self-pay | Admitting: Internal Medicine

## 2016-09-24 ENCOUNTER — Ambulatory Visit (INDEPENDENT_AMBULATORY_CARE_PROVIDER_SITE_OTHER): Payer: BLUE CROSS/BLUE SHIELD | Admitting: Pharmacist Clinician (PhC)/ Clinical Pharmacy Specialist

## 2016-09-24 ENCOUNTER — Ambulatory Visit (INDEPENDENT_AMBULATORY_CARE_PROVIDER_SITE_OTHER): Payer: BLUE CROSS/BLUE SHIELD | Admitting: Cardiovascular Disease

## 2016-09-24 ENCOUNTER — Encounter: Payer: Self-pay | Admitting: Cardiovascular Disease

## 2016-09-24 VITALS — BP 128/90 | HR 96 | Ht 70.0 in | Wt 229.6 lb

## 2016-09-24 DIAGNOSIS — I2510 Atherosclerotic heart disease of native coronary artery without angina pectoris: Secondary | ICD-10-CM | POA: Diagnosis not present

## 2016-09-24 DIAGNOSIS — R55 Syncope and collapse: Secondary | ICD-10-CM

## 2016-09-24 DIAGNOSIS — Z79899 Other long term (current) drug therapy: Secondary | ICD-10-CM | POA: Diagnosis not present

## 2016-09-24 DIAGNOSIS — I480 Paroxysmal atrial fibrillation: Secondary | ICD-10-CM | POA: Diagnosis not present

## 2016-09-24 DIAGNOSIS — I10 Essential (primary) hypertension: Secondary | ICD-10-CM

## 2016-09-24 DIAGNOSIS — I495 Sick sinus syndrome: Secondary | ICD-10-CM | POA: Diagnosis not present

## 2016-09-24 DIAGNOSIS — R001 Bradycardia, unspecified: Secondary | ICD-10-CM

## 2016-09-24 DIAGNOSIS — Z7901 Long term (current) use of anticoagulants: Secondary | ICD-10-CM | POA: Diagnosis not present

## 2016-09-24 DIAGNOSIS — E785 Hyperlipidemia, unspecified: Secondary | ICD-10-CM

## 2016-09-24 LAB — POCT INR: INR: 2

## 2016-09-24 NOTE — Progress Notes (Signed)
Cardiology Office Note    Date:  09/25/2016   ID:  Lucas Porter, DOB 1956-01-06, MRN [id number]  PCP:  Harrison Mons, PA-C  Cardiologist:  Thompson Grayer, MD; [name], MD   Chief Complaint  Patient presents with  . Follow-up    pt c/o DOE    History of Present Illness:  Lucas Porter is a 61 y.o. male with coronary artery disease (CABG 2010, LMCA-LCX DES 2013), paroxysmal atrial fibrillation, new early mediated syncope, tachycardia-bradycardia syndrome, dual-chamber pacemaker  (Medtronic Revo 2012), hypertension, hyperlipidemia, returning for follow-up.  Lucas Porter continues to have a very high burden of atrial fibrillation, despite previous ablation procedures and treatment with dofetilide. Despite this, the arrhythmia has been asymptomatic following his last ablation procedure. Interrogation of his pacemaker shows an overall burden of atrial fibrillation of 44% over the last year with a generally increasing prevalence trend. Rate control is generally good. Occasionally he has organized atrial flutter with 2:1 AV block and rapid ventricular rates, but these episodes are consistently brief, generally less than 2 minutes. Pacemaker function is otherwise normal. Battery voltage 2.93 V (RRT 2.81 V). He has roughly 23% ventricular pacing and 45% atrial pacing. Activity level remains good at over 4 hours a day.  He has not had any problems with angina or exertional dyspnea (above baseline NYHA functional class II). He denies edema, orthopnea, PND or syncope. He has had a couple of episodes of aborted syncope with associated nausea, alleviated by sitting down. He has not had any serious bleeding problems and is compliant with anticoagulation. He has managed to lose a little bit of weight since his last appointment.  Lucas Porter has a long-standing history of CAD. He underwent bypass surgery in 2011 (LIMA to LAD, free radial artery to left PDA , saphenous vein grafts to diagonal and ramus  intermedius).  In 2013 he received a drug-eluting Promus 416 mm stent to the left main coronary artery extending into the proximal left circumflex artery.  He has undergone 2 separate radiofrequency ablation procedures for atrial fibrillation via the percutaneous approach in 2013 and 2014. 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  He has normal left ventricular systolic function.   his last heart catheterization was performed in May 2013 and showed the following findings:  1. The left main coronary artery is is very short... 70% stenosis. It generates multiple branches: The LAD artery, a larger cranial radius intermedius artery, a smaller bifurcating distal ramus intermedius artery and the dominant left circumflex coronary artery. All the branch vessels appear to have significant ostial stenosis. Marland KitchenMarland KitchenMarland KitchenThe left main to circumflex stent is widely patent with minimal in stent restenosis. 2. The left anterior descending artery is not seen during angiography of the left coronary artery the to a severe ostial stenosis.... 3. The cranial ramus intermedius artery branch has an 90% ostial stenosis ... 4. The caudal ramus intermedius artery branch is relatively small and bifurcates early it also has an approximately 90% ostial stenosis  5. The left circumflex coronary artery is a very large-size vessel dominant vessel that generates 4 major branches... The left main stent extends across the ramii ostia into the circumflex artery and is widely patent as described above. ... There may be a 40% stenosis just before the crux and the beginning of the PDA, but the area is difficult to visualize due to tortuosity and overlap.  6. The right coronary artery is a small-size non- dominant vessel ... 7. The left  internal mammary artery bypass to the mid LAD artery is a widely patent... 8. The saphenous vein graft bypass to the ramus intermedius artery is widely patent...   9. The saphenous vein graft bypass to the first diagonal artery... has excellent flow and feeds a relatively small vascular bed.  10. The radial artery bypass to the posterior descending artery is totally occluded. ...  Past Medical History:  Diagnosis Date  . Anxiety   . Atrial fibrillation (HCC)    paroxysmal  . Atrial flutter ([place])   . Coagulopathy ([place])    persistent elevation of ACT during 10/17/11 hospitalization  . Coronary artery disease    s/p CABG 2010, s/p Successful PCI  10/17/11 of Distal Left Main and the Left Circumflex with a Promus Element DES stent -- 4.0 mm x 16 mm postdilated to 4.12 mm   . DDD (degenerative disc disease)   . Depression   . Dysrhythmia    afib  . Facial asymmetry 2010   pt. stated it was very temporary, never seen by doctor for it.   Marland Kitchen GERD (gastroesophageal reflux disease)   . Heart murmur   . Hyperlipidemia   . Hypertension   . Kidney stones   . Presence of permanent cardiac pacemaker    Medtronic   . S/P coronary artery stent placement to distal Lt. main and Lt. Cx 10/17/11  . Tachycardia-bradycardia syndrome ([place]) 2/12   s/p PPM (MDT) by Dr Blanch Media    Past Surgical History:  Procedure Laterality Date  . ATRIAL FIBRILLATION ABLATION  04/24/12, 12/02/12   PVI x2 and CTI ablation by Dr Rayann Heman  . ATRIAL FIBRILLATION ABLATION N/A 04/24/2012   Procedure: ATRIAL FIBRILLATION ABLATION;  Surgeon: Thompson Grayer, MD;  Location: [facility] CATH LAB;  Service: Cardiovascular;  Laterality: N/A;  . ATRIAL FIBRILLATION ABLATION N/A 12/02/2012   Procedure: ATRIAL FIBRILLATION ABLATION;  Surgeon: Thompson Grayer, MD;  Location: [facility] CATH LAB;  Service: Cardiovascular;  Laterality: N/A;  . CORONARY ANGIOPLASTY  10/19/11  . CORONARY ARTERY BYPASS GRAFT  2010  . CYSTOSCOPY  2007 ?   treatment for Kidney stones   . INGUINAL HERNIA REPAIR Left 01/04/2016   Procedure: LAPAROSCOPIC LEFT INGUINAL HERNIA WITH MESH;  Surgeon: Ralene Ok, MD;  Location: [place];  Service:  General;  Laterality: Left;  . INSERT / REPLACE / REMOVE PACEMAKER  2/12   Medtronic  . INSERTION OF MESH Left 01/04/2016   Procedure: INSERTION OF MESH;  Surgeon: Ralene Ok, MD;  Location: [place];  Service: General;  Laterality: Left;  . LAPAROSCOPIC LYSIS OF ADHESIONS Left 01/04/2016   Procedure: LAPAROSCOPIC LYSIS OF ADHESIONS;  Surgeon: Ralene Ok, MD;  Location: [place];  Service: General;  Laterality: Left;  . LEFT HEART CATHETERIZATION WITH CORONARY/GRAFT ANGIOGRAM N/A 10/17/2011   Procedure: LEFT HEART CATHETERIZATION WITH Beatrix Fetters;  Surgeon: [name], MD;  Location: [place] CATH LAB;  Service: Cardiovascular;  Laterality: N/A;  . LEFT HEART CATHETERIZATION WITH CORONARY/GRAFT ANGIOGRAM N/A 02/08/2012   Procedure: LEFT HEART CATHETERIZATION WITH Beatrix Fetters;  Surgeon: [name], MD;  Location: [place] CATH LAB;  Service: Cardiovascular;  Laterality: N/A;  . NM MYOCAR PERF WALL MOTION  03/23/2011   Low risk  . PERCUTANEOUS CORONARY STENT INTERVENTION (PCI-S)  10/17/2011   Procedure: PERCUTANEOUS CORONARY STENT INTERVENTION (PCI-S);  Surgeon: [name], MD;  Location: [facility] CATH LAB;  Service: Cardiovascular;;  . TEE WITHOUT CARDIOVERSION  04/23/2012   Procedure: TRANSESOPHAGEAL ECHOCARDIOGRAM (TEE);  Surgeon: Josue Hector, MD;  Location:  [facility] ENDOSCOPY;  Service: Cardiovascular;  Laterality: N/A;  spoke to pt about time change from 11a to 12p/DL  . TEE WITHOUT CARDIOVERSION N/A 12/01/2012   Procedure: TRANSESOPHAGEAL ECHOCARDIOGRAM (TEE);  Surgeon: [name], MD;  Location: [facility] ENDOSCOPY;  Service: Cardiovascular;  Laterality: N/A;    Current Medications: Outpatient Medications Prior to Visit  Medication Sig Dispense Refill  . atenolol (TENORMIN) 100 MG tablet TAKE ONE TABLET BY MOUTH ONCE DAILY (Patient taking differently: TAKE ONE TABLET BY MOUTH ONCE DAILY   takes in a.m.) 90 tablet 3  . baclofen (LIORESAL) 10 MG tablet Take 1/2 to 1 tab po TID prn  for muscle spasms, may sedate so do not take with clonazepam (Patient taking differently: Take 5-10 mg by mouth 3 (three) times daily as needed. Take 1/2 to 1 tab po TID prn for muscle spasms, may sedate so do not take with clonazepam) 20 each 0  . clonazePAM (KLONOPIN) 0.5 MG tablet Take 1 tablet (0.5 mg total) by mouth 2 (two) times daily as needed for anxiety. 30 tablet 1  . clopidogrel (PLAVIX) 75 MG tablet Take 1 tablet (75 mg total) by mouth daily. MUST KEEP APPOINTMENT 09/24/2016 WITH DR Margarete Horace FOR FUTURE REFILLS 90 tablet 0  . diltiazem (DILACOR XR) 180 MG 24 hr capsule Take 1 capsule (180 mg total) by mouth daily. NEED OV. 90 capsule 0  . dofetilide (TIKOSYN) 500 MCG capsule Take 1 capsule (500 mcg total) by mouth 2 (two) times daily. NEEDS APPOINTMENT FOR FUTURE REFILLS 60 capsule 1  . fish oil-omega-3 fatty acids 1000 MG capsule Take 2 g by mouth daily.     . fluticasone (FLONASE) 50 MCG/ACT nasal spray     . HYDROcodone-acetaminophen (NORCO) 5-325 MG per tablet Take 1 tablet by mouth every 6 (six) hours as needed for moderate pain. Take with stool softener, may cause constipation and drowsiness, do not take extra tylenol. 20 tablet 0  . hydroxypropyl methylcellulose / hypromellose (ISOPTO TEARS / GONIOVISC) 2.5 % ophthalmic solution Place 1 drop into both eyes as needed for dry eyes.    Marland Kitchen lisinopril (PRINIVIL,ZESTRIL) 20 MG tablet Take 1 tablet (20 mg total) by mouth daily. 90 tablet 3  . nitroGLYCERIN (NITROSTAT) 0.4 MG SL tablet Place 1 tablet (0.4 mg total) under the tongue every 5 (five) minutes as needed for chest pain. 25 tablet 3  . oxyCODONE-acetaminophen (ROXICET) 5-325 MG tablet Take 1-2 tablets by mouth every 4 (four) hours as needed. 20 tablet 0  . potassium chloride (K-DUR,KLOR-CON) 10 MEQ tablet Take 1 tablet (10 mEq total) by mouth daily. PLEASE CONTACT OFFICE FOR ADDITIONAL REFILLS 60 tablet 0  . pravastatin (PRAVACHOL) 80 MG tablet TAKE ONE TABLET BY MOUTH ONCE DAILY.  (Patient taking differently: Take 80 mg by mouth daily. TAKE ONE TABLET BY MOUTH ONCE DAILY.) 90 tablet 3  . venlafaxine XR (EFFEXOR-XR) 150 MG 24 hr capsule TAKE ONE CAPSULE BY MOUTH ONCE DAILY WITH  THE  75  MG  TO  TOTAL  225  MG  DAILY 90 capsule 3  . venlafaxine XR (EFFEXOR-XR) 75 MG 24 hr capsule TAKE ONE CAPSULE BY MOUTH ONCE DAILY TAKE  WITH  150  MG  TO  EQUAL  A  TOTAL  OF  225  MG  DAILY 90 capsule 3  . warfarin (COUMADIN) 5 MG tablet TAKE ONE TABLET BY MOUTH ONCE DAILY OR  AS DIRECTED BY COUMADIN CLINIC 90 tablet 0  . pantoprazole (PROTONIX) 40 MG tablet Take 1  tablet (40 mg total) by mouth daily. 30 tablet 5   No facility-administered medications prior to visit.      Allergies:   Patient has no known allergies.   Social History   Social History  . Marital status: Married    Spouse name: Morey Hummingbird  . Number of children: 2  . Years of education: 9th grade   Occupational History  . disability-heart disease     fence installation   Social History Main Topics  . Smoking status: Former Smoker    Years: 1.00    Types: Cigarettes    Quit date: 12/18/1963  . Smokeless tobacco: Former Systems developer    Quit date: 10/03/2012     Comment: quit smoking in 1970's  . Alcohol use 0.6 oz/week    1 Cans of beer per week     Comment: rare  . Drug use:     Types: Marijuana     Comment: sometimes (down from every day)  . Sexual activity: Yes   Other Topics Concern  . None   Social History Narrative   Pt lives in [place] with spouse.  2 grown daughters.  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     Family History:  The patient's family history includes Cancer in his mother; Heart attack in his brother; Stroke in his brother.   ROS:   Please see the history of present illness.    ROS All other systems reviewed and are negative.   PHYSICAL EXAM:   VS:  BP 128/90 (BP Location: Right Arm, Patient Position: Sitting, Cuff Size: Normal)   Pulse 96   Ht 5\' 10"  (1.778 m)    Wt 229 lb 9.6 oz (104.1 kg)   SpO2 96%   BMI 32.94 kg/m    GEN: Well nourished, well developed, in no acute distress Mild to moderate obesity HEENT: normal  Neck: no JVD, carotid bruits, or masses Cardiac: RRR; no murmurs, rubs, or gallops,no edema , healthy left subclavian pacemaker site Respiratory:  clear to auscultation bilaterally, normal work of breathing GI: soft, nontender, nondistended, + BS MS: no deformity or atrophy  Skin: warm and dry, no rash Neuro:  Alert and Oriented x 3, Strength and sensation are intact Psych: euthymic mood, full affect  Wt Readings from Last 3 Encounters:  09/24/16 229 lb 9.6 oz (104.1 kg)  07/11/16 235 lb (106.6 kg)  01/04/16 255 lb (115.7 kg)      Studies/Labs Reviewed:   EKG:  EKG is ordered today.  The ekg ordered today demonstrates sinus rhythm with a prolonged P wave and mild first-degree AV block around 220 ms. There is mild poor R-wave progression. QTC is in acceptable range at 429 ms.  Recent Labs: 07/11/2016: ALT 25; BUN 14; Creat 1.02; Hemoglobin 14.4; Platelets 178; Potassium 4.0; Sodium 139; TSH 2.73   Lipid Panel    Component Value Date/Time   CHOL 180 10/14/2015 1050   TRIG 282 (H) 10/14/2015 1050   HDL 29 (L) 10/14/2015 1050   CHOLHDL 6.2 (H) 10/14/2015 1050   VLDL 56 (H) 10/14/2015 1050   LDLCALC 95 10/14/2015 1050     ASSESSMENT:    1. Coronary artery disease involving native coronary artery of native heart without angina pectoris   2. Tachy-brady syndrome (HCC)   3. Paroxysmal atrial fibrillation ([place])   4. Neurocardiogenic syncope   5. Dyslipidemia   6. Essential hypertension   7. Medication management      PLAN:  In  order of problems listed above:  1. CAD s/p CABG 2011, LCA DES 2013: Asymptomatic. Preserved left ventricular systolic function. Has known occlusion of the bypass surgery to the inferior wall without any good options for revascularization. 2. Tachycardia-bradycardia sd: He is no longer  aware of the rapid rhythm. Does not have symptoms of bradycardia or chronotropic incompetence.. 3. AFib: despite 2 attempts at ablation and antiarrhythmic therapy his burden of atrial fibrillation is high and seems to be slowly increasing. Thankfully, following a second ablation the arrhythmia is no longer symptomatic. QTc is OK. Need to periodically review the wisdom of continued treatment with dofetilide if the burden of atrial fibrillation is so high. Will discuss with Dr. Rayann Heman. Continue anticoagulation. CHADSVasc 2 (HTN, CAD). 4. Neurocardiogenic syncope: No recent major events. Reminded him that he needs to immediately pay heed to prodromal symptoms and get flat.  5. HLP: Need to reevaluate his lipid profile. He has managed to lose a little bit of weight. To note persistent hypertriglyceridemia and very low HDL which will not likely improve until he loses weight. Target LDL <70. On statin and diltiazem. Consider switching to Crestor if necessary. 6. HTN: Blood pressure control is fair.     Medication Adjustments/Labs and Tests Ordered: Current medicines are reviewed at length with the patient today.  Concerns regarding medicines are outlined above.  Medication changes, Labs and Tests ordered today are listed in the Patient Instructions below. Patient Instructions  Dr Sallyanne Kuster recommends that you continue on your current medications as directed. Please refer to the Current Medication list given to you today.  Your physician recommends that you return for lab work at your earliest [place].  Remote monitoring is used to monitor your Pacemaker of ICD from home. This monitoring reduces the number of office visits required to check your device to one time per year. It allows [place] to keep an eye on the functioning of your device to ensure it is working properly. You are scheduled for a device check from home on Monday, April 9th, 2018. You may send your transmission at any time that day.  If you have a wireless device, the transmission will be sent automatically. After your physician reviews your transmission, you will receive a postcard with your next transmission date.  Dr Sallyanne Kuster recommends that you schedule a follow-up appointment in 12 months. You will receive a reminder letter in the mail two months in advance. If you don't receive a letter, please call our office to schedule the follow-up appointment.  If you need a refill on your cardiac medications before your next appointment, please call your pharmacy.    Signed, [name], MD  09/25/2016 12:07 PM    [address], [place], [place]  [address] Phone: [phone]; Fax: [phone]

## 2016-09-24 NOTE — Patient Instructions (Signed)
Dr Sallyanne Kuster recommends that you continue on your current medications as directed. Please refer to the Current Medication list given to you today.  Your physician recommends that you return for lab work at your earliest [place].  Remote monitoring is used to monitor your Pacemaker of ICD from home. This monitoring reduces the number of office visits required to check your device to one time per year. It allows [place] to keep an eye on the functioning of your device to ensure it is working properly. You are scheduled for a device check from home on Monday, April 9th, 2018. You may send your transmission at any time that day. If you have a wireless device, the transmission will be sent automatically. After your physician reviews your transmission, you will receive a postcard with your next transmission date.  Dr Sallyanne Kuster recommends that you schedule a follow-up appointment in 12 months. You will receive a reminder letter in the mail two months in advance. If you don't receive a letter, please call our office to schedule the follow-up appointment.  If you need a refill on your cardiac medications before your next appointment, please call your pharmacy.

## 2016-09-26 LAB — CUP PACEART INCLINIC DEVICE CHECK
Battery Voltage: 2.93 V
Brady Statistic AP VS Percent: 42.05 %
Brady Statistic AS VS Percent: 34.89 %
Brady Statistic RV Percent Paced: 22.84 %
Implantable Lead Implant Date: 20120229
Implantable Lead Implant Date: 20120229
Implantable Lead Location: 753860
Implantable Lead Model: 5086
Implantable Lead Model: 5086
Lead Channel Impedance Value: 568 Ohm
Lead Channel Sensing Intrinsic Amplitude: 1.1 mV
Lead Channel Sensing Intrinsic Amplitude: 20.041 mV
Lead Channel Setting Sensing Sensitivity: 0.9 mV
MDC IDC LEAD LOCATION: 753859
MDC IDC MSMT LEADCHNL RV IMPEDANCE VALUE: 568 Ohm
MDC IDC PG IMPLANT DT: 20120229
MDC IDC SESS DTM: 20180108160529
MDC IDC SET LEADCHNL RA PACING AMPLITUDE: 2 V
MDC IDC SET LEADCHNL RV PACING AMPLITUDE: 2.5 V
MDC IDC SET LEADCHNL RV PACING PULSEWIDTH: 0.4 ms
MDC IDC STAT BRADY AP VP PERCENT: 3.3 %
MDC IDC STAT BRADY AS VP PERCENT: 19.76 %
MDC IDC STAT BRADY RA PERCENT PACED: 38.06 %

## 2016-09-26 LAB — COMPREHENSIVE METABOLIC PANEL
ALT: 20 U/L (ref 9–46)
AST: 23 U/L (ref 10–35)
Albumin: 4.2 g/dL (ref 3.6–5.1)
Alkaline Phosphatase: 82 U/L (ref 40–115)
BUN: 15 mg/dL (ref 7–25)
CO2: 27 mmol/L (ref 20–31)
Calcium: 9.4 mg/dL (ref 8.6–10.3)
Chloride: 102 mmol/L (ref 98–110)
Creat: 0.98 mg/dL (ref 0.70–1.25)
GLUCOSE: 145 mg/dL — AB (ref 65–99)
POTASSIUM: 4.6 mmol/L (ref 3.5–5.3)
Sodium: 139 mmol/L (ref 135–146)
TOTAL PROTEIN: 7 g/dL (ref 6.1–8.1)
Total Bilirubin: 0.5 mg/dL (ref 0.2–1.2)

## 2016-09-26 LAB — LIPID PANEL
Cholesterol: 189 mg/dL (ref <200)
HDL: 32 mg/dL — AB (ref >40)
LDL CALC: 109 mg/dL — AB (ref <100)
TRIGLYCERIDES: 241 mg/dL — AB (ref <150)
Total CHOL/HDL Ratio: 5.9 Ratio — ABNORMAL HIGH (ref <5.0)
VLDL: 48 mg/dL — AB (ref <30)

## 2016-10-05 ENCOUNTER — Other Ambulatory Visit: Payer: Self-pay | Admitting: Cardiovascular Disease

## 2016-10-05 NOTE — Telephone Encounter (Signed)
Rx(s) sent to pharmacy electronically.  

## 2016-10-08 ENCOUNTER — Other Ambulatory Visit: Payer: Self-pay | Admitting: *Deleted

## 2016-10-08 NOTE — Telephone Encounter (Signed)
Kennyth Lose from [facility] left a msg on the refill vm requesting a refill on tikosyn for the patient. She stated that the patient only has one day left of medication. The number to call is [phone] or fax [phone]. Thanks, MI

## 2016-10-09 ENCOUNTER — Other Ambulatory Visit: Payer: Self-pay | Admitting: *Deleted

## 2016-10-09 MED ORDER — DOFETILIDE 500 MCG PO CAPS: 500.0000 ug | ORAL_CAPSULE | Freq: Two times a day (BID) | ORAL | 3 refills | Status: DC

## 2016-10-09 NOTE — Telephone Encounter (Signed)
dofetilide (TIKOSYN) 500 MCG capsule  Medication  Date: 08/06/2016 Department: [facility] Ordering: Carla Drape, CMA Authorizing: [name], MD  Order Providers   Prescribing Provider Encounter Provider  [name], MD Carla Drape, CMA  Medication Detail    Disp Refills Start End   dofetilide (TIKOSYN) 500 MCG capsule 60 capsule 1 08/06/2016    Sig - Route: Take 1 capsule (500 mcg total) by mouth 2 (two) times daily. NEEDS APPOINTMENT FOR FUTURE REFILLS - Oral   Notes to Pharmacy: Please consider 90 day supplies to promote better adherence - Please advise patient to make an appointment, he hasn't been seen since 07/2015.   Cosign for Ordering: Accepted by [name], MD on 08/06/2016 2:19 PM   E-Prescribing Status: Receipt confirmed by pharmacy (08/06/2016 11:06 AM EST)   Pharmacy   [facility], [address]

## 2016-10-19 ENCOUNTER — Other Ambulatory Visit: Payer: Self-pay | Admitting: Emergency Medicine

## 2016-10-19 ENCOUNTER — Other Ambulatory Visit: Payer: Self-pay | Admitting: Family Medicine

## 2016-10-19 DIAGNOSIS — I10 Essential (primary) hypertension: Secondary | ICD-10-CM

## 2016-10-19 MED ORDER — LISINOPRIL 20 MG PO TABS: 20.0000 mg | ORAL_TABLET | Freq: Every day | ORAL | 3 refills | Status: DC

## 2016-10-22 ENCOUNTER — Ambulatory Visit (INDEPENDENT_AMBULATORY_CARE_PROVIDER_SITE_OTHER): Payer: BLUE CROSS/BLUE SHIELD | Admitting: Pharmacist

## 2016-10-22 DIAGNOSIS — I2510 Atherosclerotic heart disease of native coronary artery without angina pectoris: Secondary | ICD-10-CM

## 2016-10-22 DIAGNOSIS — Z7901 Long term (current) use of anticoagulants: Secondary | ICD-10-CM | POA: Diagnosis not present

## 2016-10-22 DIAGNOSIS — I480 Paroxysmal atrial fibrillation: Secondary | ICD-10-CM | POA: Diagnosis not present

## 2016-10-22 LAB — POCT INR: INR: 2.4

## 2016-11-01 ENCOUNTER — Telehealth: Payer: Self-pay

## 2016-11-01 DIAGNOSIS — I10 Essential (primary) hypertension: Secondary | ICD-10-CM

## 2016-11-01 MED ORDER — LISINOPRIL 20 MG PO TABS: 20.0000 mg | ORAL_TABLET | Freq: Every day | ORAL | 0 refills | Status: DC

## 2016-11-01 NOTE — Telephone Encounter (Signed)
THIS MESSAGE IS FROM PATIENT'S WIFE (CARRIE) REGARDING HER HUSBAND'S LISINOPRIL 20 mg. SHE SAID THE PHARMACY TOLD HER THEY HAVE FAXED OVER A REFILL REQUEST 5 TIMES AND THEY HAVE NOT GOTTEN A RESPONSE BACK FROM [place]. SHE SAID HE IS COMPLETELY OUT. (I LOOKED IN HIS CHART AND IT LOOKS LIKE THE ELECTRONIC REQUEST WAS SENT TO DR. [id number] OFFICE AND REFILLED). HIS WIFE SAID DR. COPLAND'S OFFICE SHOULD NOT HAVE REFILLED IT BECAUSE WHEN HER HUSBAND SAW [name] IN OCT. OF LAST YEAR SHE TOLD HIM THAT SHE WOULD BE HIS PRIMARY CARE. SHE WOULD LIKE IT TO BE CALLED INTO THEIR PHARMACY. BEST PHONE [phone] ([place]) [place] Arenas, [place]

## 2016-11-01 NOTE — Telephone Encounter (Signed)
Sent now, looks like pcp has been updated as well

## 2016-11-04 ENCOUNTER — Other Ambulatory Visit: Payer: Self-pay | Admitting: Family Medicine

## 2016-11-04 DIAGNOSIS — E785 Hyperlipidemia, unspecified: Secondary | ICD-10-CM

## 2016-11-07 ENCOUNTER — Ambulatory Visit (INDEPENDENT_AMBULATORY_CARE_PROVIDER_SITE_OTHER): Payer: Medicare Other | Admitting: Physician Assistant

## 2016-11-07 VITALS — BP 120/80 | HR 76 | Temp 98.5°F | Resp 18 | Ht 70.0 in | Wt 233.0 lb

## 2016-11-07 DIAGNOSIS — R351 Nocturia: Secondary | ICD-10-CM

## 2016-11-07 DIAGNOSIS — I2510 Atherosclerotic heart disease of native coronary artery without angina pectoris: Secondary | ICD-10-CM

## 2016-11-07 DIAGNOSIS — G4733 Obstructive sleep apnea (adult) (pediatric): Secondary | ICD-10-CM | POA: Diagnosis not present

## 2016-11-07 DIAGNOSIS — R3915 Urgency of urination: Secondary | ICD-10-CM

## 2016-11-07 DIAGNOSIS — G478 Other sleep disorders: Secondary | ICD-10-CM | POA: Diagnosis not present

## 2016-11-07 DIAGNOSIS — E785 Hyperlipidemia, unspecified: Secondary | ICD-10-CM | POA: Diagnosis not present

## 2016-11-07 DIAGNOSIS — I10 Essential (primary) hypertension: Secondary | ICD-10-CM

## 2016-11-07 DIAGNOSIS — Z1211 Encounter for screening for malignant neoplasm of colon: Secondary | ICD-10-CM | POA: Diagnosis not present

## 2016-11-07 DIAGNOSIS — E291 Testicular hypofunction: Secondary | ICD-10-CM

## 2016-11-07 MED ORDER — ROSUVASTATIN CALCIUM 40 MG PO TABS: 40.0000 mg | ORAL_TABLET | Freq: Every day | ORAL | 3 refills | Status: DC

## 2016-11-07 MED ORDER — ATENOLOL 100 MG PO TABS: 100.0000 mg | ORAL_TABLET | Freq: Every day | ORAL | 3 refills | Status: DC

## 2016-11-07 MED ORDER — LISINOPRIL 20 MG PO TABS: 20.0000 mg | ORAL_TABLET | Freq: Every day | ORAL | 0 refills | Status: DC

## 2016-11-07 NOTE — Progress Notes (Signed)
Patient ID: Lucas Porter, male    DOB: 1956-08-18, 61 y.o.   MRN: [id number]  PCP: Harrison Mons, PA-C  Chief Complaint  Patient presents with  . Medication Refill    lisinopril,atenolol,pravastatin    Subjective:   Presents for medication refills. He is accompanied by his wife.  He reports that he is doing well, tolerating his medications.  No missed doses. He and his wife had a cold last week and are both recovering. His wheezing is improving.  He had labs drawn following his 09/24/2016 visit with Dr. Sallyanne Kuster, but hasn't heard about the results. He has significant cardiac history, including CAD s/p CABG and stenting, PAF on chronic anticoagulation, and neurocardiogenic syncope. From that note: HLP: Need to reevaluate his lipid profile. He has managed to lose a little bit of weight. To note persistent hypertriglyceridemia and very low HDL which will not likely improve until he loses weight. Target LDL <70. On statin and diltiazem. Consider switching to Crestor if necessary.  Not very perky. "Lazy." History of low testosterone, for which he was briefly treated, but it was stopped. He doesn't recall when or who advised the discontinuation, but it was most likely due to concerns for increased cardiovascular risk in men on testosterone. Notes indicate that he had better symptom control on Androgel than Axiron, but his insurance didn't cover it. I note that it was on his medication list, marked not taking, and discontinued at his visit 10/12/2015. Testosterone at that time was 124 ng/dL.  Wakes every 60 minutes during the night to urinate. Sometimes just a dribble, sometimes a full volume for him. During the day he experiences urinary urgency, but no increased frequency or dysuria. No hematuria.  We reviewed his chart together. -He is overdue for a colonoscopy. -Last PSA 12/13/2014 1.44 ng/mL (previous results 1.35, 1/75, 0.95 over the past 4 years). -Sleep study in 2012 revealed  RLS, UARS and moderate OSA, but he reports that he was never notified of the results and hasn't been back for the titration study. His wife doesn't notice leg movements. He notes that SHE moves her legs a lot in her sleep.  I note a referral to ENT in 09/2015 due to globus sensation. He saw Dr. Benjamine Mola and there is a note in the record of a flexible nasal endoscopy performed 11/29/2015 in which it is documented that the nasal congestion and throat discomfort of 3 weeks previous was resolved following treatment with Augmentin, Flonase and saline irrigation. He was noted to have a septal spur. There is no comment regarding the globus sensation, but notes from the initial visit are not available.    Review of Systems  Constitutional: Positive for fatigue. Negative for chills, fever and unexpected weight change.  HENT: Positive for congestion, hearing loss, postnasal drip and rhinorrhea. Negative for drooling, ear pain, sinus pain, sinus pressure, sneezing, sore throat, trouble swallowing and voice change.   Eyes: Negative for visual disturbance.  Respiratory: Positive for cough and wheezing. Negative for shortness of breath.   Cardiovascular: Negative for chest pain, palpitations and leg swelling.  Gastrointestinal: Negative for diarrhea, nausea and vomiting.  Endocrine: Negative for polydipsia.  Genitourinary: Positive for frequency (nocturia) and urgency. Negative for dysuria.  Musculoskeletal: Negative for myalgias.  Skin: Negative for rash.  Neurological: Negative for dizziness and headaches.       Patient Active Problem List   Diagnosis Date Noted  . Morbid obesity ([place]) 10/12/2015  . 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  Symptomatic bradycardia 03/09/2013  . Long term (current) use of anticoagulants 12/03/2012  . BMI 40.0-44.9, adult ([place]) 05/13/2012  . Atrial flutter ([place]) 04/24/2012  . S/P coronary artery stent placement to distal Lt. main and Lt. Cx, 10/17/11 10/19/2011  . Angina  pectoris 10/19/2011  . Coagulopathy ([place]) 10/19/2011  . History of pacemaker, with a-v pacing 10/19/2011  . CAD (coronary artery disease) with CABG 2011, LIMA to LAD, Free radial to Lt. PDA, VG to Diag and ramus intermedius 10/02/2011  . Anxiety 10/02/2011  . Paroxysmal atrial fibrillation ([place]) 10/02/2011  . Hypertension 10/02/2011  . Dyslipidemia 10/02/2011  . Hypogonadism male 10/02/2011     Prior to Admission medications   Medication Sig Start Date End Date Taking? Authorizing Provider  atenolol (TENORMIN) 100 MG tablet TAKE ONE TABLET BY MOUTH ONCE DAILY Patient taking differently: TAKE ONE TABLET BY MOUTH ONCE DAILY   takes in a.m. 12/26/15  Yes Gay Filler Copland, MD  clonazePAM (KLONOPIN) 0.5 MG tablet Take 1 tablet (0.5 mg total) by mouth 2 (two) times daily as needed for anxiety. 12/13/14  Yes Gay Filler Copland, MD  clopidogrel (PLAVIX) 75 MG tablet Take 1 tablet (75 mg total) by mouth daily. MUST KEEP APPOINTMENT 09/24/2016 WITH DR CROITORU FOR FUTURE REFILLS 08/22/16  Yes Mihai Croitoru, MD  diltiazem (DILACOR XR) 180 MG 24 hr capsule TAKE ONE CAPSULE BY MOUTH ONCE DAILY 10/05/16  Yes Mihai Croitoru, MD  dofetilide (TIKOSYN) 500 MCG capsule Take 1 capsule (500 mcg total) by mouth 2 (two) times daily. 10/09/16  Yes Mihai Croitoru, MD  fish oil-omega-3 fatty acids 1000 MG capsule Take 2 g by mouth daily.    Yes Historical Provider, MD  fluticasone Asencion Islam) 50 MCG/ACT nasal spray  06/18/16  Yes Historical Provider, MD  hydroxypropyl methylcellulose / hypromellose (ISOPTO TEARS / GONIOVISC) 2.5 % ophthalmic solution Place 1 drop into both eyes as needed for dry eyes.   Yes Historical Provider, MD  KLOR-CON M10 10 MEQ tablet TAKE ONE TABLET BY MOUTH ONCE DAILY 10/05/16  Yes Mihai Croitoru, MD  lisinopril (PRINIVIL,ZESTRIL) 20 MG tablet Take 1 tablet (20 mg total) by mouth daily. 11/01/16  Yes Naisha Wisdom, PA-C  nitroGLYCERIN (NITROSTAT) 0.4 MG SL tablet Place 1 tablet (0.4 mg total) under  the tongue every 5 (five) minutes as needed for chest pain. 05/01/13  Yes Thompson Grayer, MD  pravastatin (PRAVACHOL) 80 MG tablet TAKE ONE TABLET BY MOUTH ONCE DAILY. Patient taking differently: Take 80 mg by mouth daily. TAKE ONE TABLET BY MOUTH ONCE DAILY. 10/12/15  Yes Gay Filler Copland, MD  venlafaxine XR (EFFEXOR-XR) 150 MG 24 hr capsule TAKE ONE CAPSULE BY MOUTH ONCE DAILY WITH  THE  75  MG  TO  TOTAL  225  MG  DAILY 07/11/16  Yes Masyn Fullam, PA-C  venlafaxine XR (EFFEXOR-XR) 75 MG 24 hr capsule TAKE ONE CAPSULE BY MOUTH ONCE DAILY TAKE  WITH  150  MG  TO  EQUAL  A  TOTAL  OF  225  MG  DAILY 07/11/16  Yes Zaine Elsass, PA-C  warfarin (COUMADIN) 5 MG tablet TAKE ONE TABLET BY MOUTH ONCE DAILY OR  AS DIRECTED BY COUMADIN CLINIC 06/29/16  Yes Mihai Croitoru, MD     No Known Allergies     Objective:  Physical Exam  Constitutional: He is oriented to person, place, and time. He appears well-developed and well-nourished. He is active and cooperative. No distress.  BP 120/80 (BP Location: Right Arm, Patient Position: Sitting, Cuff  Size: Small)   Pulse 76   Temp 98.5 F (36.9 C) (Oral)   Resp 18   Ht 5\' 10"  (1.778 m)   Wt 233 lb (105.7 kg)   SpO2 97%   BMI 33.43 kg/m   HENT:  Head: Normocephalic and atraumatic.  Right Ear: Hearing normal.  Left Ear: Hearing normal.  Audible wheezing during interview and exam, but normal breath sounds, suggesting upper airway origin.  Eyes: Conjunctivae are normal. No scleral icterus.  Neck: Normal range of motion. Neck supple. No thyromegaly present.  Cardiovascular: Normal rate, regular rhythm and normal heart sounds.   Pulses:      Radial pulses are 2+ on the right side, and 2+ on the left side.  Pulmonary/Chest: Effort normal and breath sounds normal.  Lymphadenopathy:       Head (right side): No tonsillar, no preauricular, no posterior auricular and no occipital adenopathy present.       Head (left side): No tonsillar, no preauricular, no  posterior auricular and no occipital adenopathy present.    He has no cervical adenopathy.       Right: No supraclavicular adenopathy present.       Left: No supraclavicular adenopathy present.  Neurological: He is alert and oriented to person, place, and time. No sensory deficit.  Skin: Skin is warm, dry and intact. No rash noted. No cyanosis or erythema. Nails show no clubbing.  Psychiatric: He has a normal mood and affect. His speech is normal and behavior is normal.           Assessment & Plan:   1. Essential hypertension Controlled. Continue current treatment. - lisinopril (PRINIVIL,ZESTRIL) 20 MG tablet; Take 1 tablet (20 mg total) by mouth daily.  Dispense: 90 tablet; Refill: 0 - atenolol (TENORMIN) 100 MG tablet; Take 1 tablet (100 mg total) by mouth daily.  Dispense: 90 tablet; Refill: 3  2. Dyslipidemia Lipids 09/24/2016 still above goal. STOP pravastatin. START rosuvastatin.Recheck in 12 weeks. - rosuvastatin (CRESTOR) 40 MG tablet; Take 1 tablet (40 mg total) by mouth daily.  Dispense: 90 tablet; Refill: 3  3. Nocturia Unable to provide a urine specimen today. Update PSA and refer to urology for evaluation of suspected BPH with LUTS. Lack of restorative sleep caused by this symptom increases his cardiovascular risk. - PSA - Ambulatory referral to Urology  4. Urinary urgency See above. - Ambulatory referral to Urology  5. Screening for colon cancer - Ambulatory referral to Gastroenterology  6. Hypogonadism male Update testosterone level, quite low on last check. Testosterone replacement in this patient presents risks with regard to his cardiovascular disease, along with likely OSA.  - Testosterone  7. OSA (obstructive sleep apnea) Presents increased cardiovascular risk. Refer for re-evaluation and treatment if indicated. - Ambulatory referral to Sleep Studies  8. UARS (upper airway resistance syndrome) This currently isn't bothersome to him, but may warrant ENT  vs pulmonology evaluation in the future. Once the other issues, above, are addressed, plan to revisit this one.   Fara Chute, PA-C Physician Assistant-Certified [facility] [place]

## 2016-11-07 NOTE — Patient Instructions (Addendum)
STOP the pravastatin. START the rosuvastatin (Crestor). Expect a call from: Neurology/Sleep Center, Urologist and the  Gastroenterologist.    IF you received an x-ray today, you will receive an invoice from [facility]. Please contact [facility] at [phone] with questions or concerns regarding your invoice.   IF you received labwork today, you will receive an invoice from [facility]. Please contact [facility] at [phone] with questions or concerns regarding your invoice.   Our billing staff will not be able to assist you with questions regarding bills from these companies.  You will be contacted with the lab results as soon as they are available. The fastest way to get your results is to activate your My Chart account. Instructions are located on the last page of this paperwork. If you have not heard from [place] regarding the results in 2 weeks, please contact this office.

## 2016-11-08 LAB — TESTOSTERONE: TESTOSTERONE: 69 ng/dL — AB (ref 264–916)

## 2016-11-08 LAB — PSA: Prostate Specific Ag, Serum: 1.7 ng/mL (ref 0.0–4.0)

## 2016-11-08 NOTE — Progress Notes (Signed)
Thanks, [name]. Not sure how we missed following up on those lipids [facility]

## 2016-11-19 ENCOUNTER — Other Ambulatory Visit: Payer: Self-pay | Admitting: Cardiovascular Disease

## 2016-11-20 ENCOUNTER — Ambulatory Visit (INDEPENDENT_AMBULATORY_CARE_PROVIDER_SITE_OTHER): Payer: BLUE CROSS/BLUE SHIELD | Admitting: Neurology

## 2016-11-20 ENCOUNTER — Encounter: Payer: Self-pay | Admitting: Neurology

## 2016-11-20 VITALS — BP 130/78 | HR 76 | Resp 16 | Ht 70.0 in | Wt 234.0 lb

## 2016-11-20 DIAGNOSIS — Z955 Presence of coronary angioplasty implant and graft: Secondary | ICD-10-CM | POA: Diagnosis not present

## 2016-11-20 DIAGNOSIS — I482 Chronic atrial fibrillation, unspecified: Secondary | ICD-10-CM

## 2016-11-20 DIAGNOSIS — I2510 Atherosclerotic heart disease of native coronary artery without angina pectoris: Secondary | ICD-10-CM | POA: Diagnosis not present

## 2016-11-20 DIAGNOSIS — G4733 Obstructive sleep apnea (adult) (pediatric): Secondary | ICD-10-CM

## 2016-11-20 DIAGNOSIS — E669 Obesity, unspecified: Secondary | ICD-10-CM

## 2016-11-20 DIAGNOSIS — Z951 Presence of aortocoronary bypass graft: Secondary | ICD-10-CM | POA: Diagnosis not present

## 2016-11-20 DIAGNOSIS — R351 Nocturia: Secondary | ICD-10-CM | POA: Diagnosis not present

## 2016-11-20 DIAGNOSIS — Z950 Presence of cardiac pacemaker: Secondary | ICD-10-CM | POA: Diagnosis not present

## 2016-11-20 NOTE — Progress Notes (Signed)
Subjective:    Patient ID: Lucas Porter is a 61 y.o. male.  HPI     Star Age, MD, PhD [facility] [address] [place], [place] [address]  Dear [name],   I saw your patient, [name], upon your kind request in my neurologic clinic today for initial consultation of his sleep disorder, in particular, concern for underlying obstructive sleep apnea. The patient is accompanied by his wife today. As you know, Mr. [name] is a 61 year old right-handed gentleman with an underlying medical history of syncope, bradycardia with status post pacemaker placement, coronary  artery disease with status post stent placement and status post CABG, anxiety, A. Fib with status post ablation twice, hypertension, hyperlipidemia, low testosterone, and obesity, who reports snoring and excessive daytime somnolence. I reviewed your office note from 11/07/2016.He has difficulty going to sleep and staying asleep. He has been taking an over-the-counter sleep aid for the past year or so, takes it nightly, usually 4 to even 5 pills each night. His wife has witnessed apneic pauses while he is asleep. His bedtime is around 11 PM, wakeup time around 10 AM. Epworth sleepiness score is 7 out of 24 today, fatigue score is 44 out of 63. He has 2 daughters. He has not worked since 2010. He used to put up fences. He has a 10th grade education, lives with his wife, has 1 dog which also contributes to sleep interruption because she is an older dog and has sleep disturbance and the dog has nocturia as well. He has an older brother who was diagnosed with obstructive sleep apnea. One brother passed away after a massive stroke as I understand, he has a 53 year old sister. He has seen ENT and was told that he has a nasal spur. He has been using Flonase. He is supposed to see a urologist for his frequent nighttime urination, of note, he has to get up 3-4 times per average night. He denies  morning headaches. He does have a TV in the bedroom and turns it off before falling asleep. he has woken himself up with a sense of gasping for air and panic.  His Past Medical History Is Significant For: Past Medical History:  Diagnosis Date  . Anxiety   . Atrial fibrillation (HCC)    paroxysmal  . Atrial flutter ([place])   . Coagulopathy ([place])    persistent elevation of ACT during 10/17/11 hospitalization  . Coronary artery disease    s/p CABG 2010, s/p Successful PCI  10/17/11 of Distal Left Main and the Left Circumflex with a Promus Element DES stent -- 4.0 mm x 16 mm postdilated to 4.12 mm   . DDD (degenerative disc disease)   . Depression   . Dysrhythmia    afib  . Facial asymmetry 2010   pt. stated it was very temporary, never seen by doctor for it.   Marland Kitchen GERD (gastroesophageal reflux disease)   . Heart murmur   . Hyperlipidemia   . Hypertension   . Kidney stones   . Presence of permanent cardiac pacemaker    Medtronic   . S/P coronary artery stent placement to distal Lt. main and Lt. Cx 10/17/11  . 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 ATRIAL FIBRILLATION ABLATION  04/24/12, 12/02/12   PVI x2 and CTI ablation by Dr Rayann Heman  .  ATRIAL FIBRILLATION ABLATION N/A 04/24/2012   Procedure: ATRIAL FIBRILLATION ABLATION;  Surgeon: Thompson Grayer, MD;  Location[facility];  Service: Cardiovascular;  Laterality: N/A;  . ATRIAL FIBRILLATION ABLATION N/A 12/02/2012   Procedure: ATRIAL FIBRILLATION ABLATION;  Surgeon: Thompson Grayer, MD;  Location: [facility] CATH LAB;  Service: Cardiovascular;  Laterality: N/A;  . CORONARY ANGIOPLASTY  10/19/11  . CORONARY ARTERY BYPASS GRAFT  2010  . CYSTOSCOPY  2007 ?   treatment for Kidney stones   . INGUINAL HERNIA REPAIR Left 01/04/2016   Procedure: LAPAROSCOPIC LEFT INGUINAL HERNIA WITH MESH;  Surgeon: Ralene Ok, MD;   Location: [facility];  Service: General;  Laterality: Left;  . INSERT / REPLACE / REMOVE PACEMAKER  2/12   Medtronic  . INSERTION OF MESH Left 01/04/2016   Procedure: INSERTION OF MESH;  Surgeon: Ralene Ok, MD;  Location: [facility];  Service: General;  Laterality: Left;  . LAPAROSCOPIC LYSIS OF ADHESIONS Left 01/04/2016   Procedure: LAPAROSCOPIC LYSIS OF ADHESIONS;  Surgeon: Ralene Ok, MD;  Location: [facility];  Service: General;  Laterality: Left;  . LEFT HEART CATHETERIZATION WITH CORONARY/GRAFT ANGIOGRAM N/A 10/17/2011   Procedure: LEFT HEART CATHETERIZATION WITH Beatrix Fetters;  Surgeon: [name], MD;  Location: [facility] CATH LAB;  Service: Cardiovascular;  Laterality: N/A;  . LEFT HEART CATHETERIZATION WITH CORONARY/GRAFT ANGIOGRAM N/A 02/08/2012   Procedure: LEFT HEART CATHETERIZATION WITH Beatrix Fetters;  Surgeon: [name], MD;  Location: [facility] CATH LAB;  Service: Cardiovascular;  Laterality: N/A;  . NM MYOCAR PERF WALL MOTION  03/23/2011   Low risk  . PERCUTANEOUS CORONARY STENT INTERVENTION (PCI-S)  10/17/2011   Procedure: PERCUTANEOUS CORONARY STENT INTERVENTION (PCI-S);  Surgeon: [name], MD;  Location: [facility] CATH LAB;  Service: Cardiovascular;;  . TEE WITHOUT CARDIOVERSION  04/23/2012   Procedure: TRANSESOPHAGEAL ECHOCARDIOGRAM (TEE);  Surgeon: Josue Hector, MD;  Location: [facility] ENDOSCOPY;  Service: Cardiovascular;  Laterality: N/A;  spoke to pt about time change from 11a to 12p/DL  . TEE WITHOUT CARDIOVERSION N/A 12/01/2012   Procedure: TRANSESOPHAGEAL ECHOCARDIOGRAM (TEE);  Surgeon: [name], MD;  Location: [facility] ENDOSCOPY;  Service: Cardiovascular;  Laterality: N/A;    His Family History Is Significant For: Family History  Problem Relation Age of Onset  . Cancer Mother   . Alzheimer's disease Father   . Stroke Brother   . Heart attack Brother   . Coronary artery disease      His Social History Is Significant For: Social History   Social History  .  Marital status: Married    Spouse name: Morey Hummingbird  . Number of children: 2  . Years of education: 10   Occupational History  . disability-heart disease     fence installation   Social History Main Topics  . Smoking status: Former Smoker    Years: 1.00    Types: Cigarettes    Quit date: 12/18/1963  . Smokeless tobacco: Former Systems developer    Quit date: 10/03/2012     Comment: quit smoking in 1970's  . Alcohol use 0.6 oz/week    1 Cans of beer per week     Comment: rare  . Drug use: Yes    Types: Marijuana     Comment: sometimes (down from every day)  . Sexual activity: Yes   Other Topics Concern  . None   Social History Narrative   Pt lives in [place] with spouse.  2 grown daughters.  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   Caffeine  intake daily     His Allergies Are:  No Known Allergies:   His Current Medications Are:  Outpatient Encounter Prescriptions as of 11/20/2016  Medication Sig  . atenolol (TENORMIN) 100 MG tablet Take 1 tablet (100 mg total) by mouth daily.  . clonazePAM (KLONOPIN) 0.5 MG tablet Take 1 tablet (0.5 mg total) by mouth 2 (two) times daily as needed for anxiety.  . clopidogrel (PLAVIX) 75 MG tablet Take 1 tablet (75 mg total) by mouth daily. MUST KEEP APPOINTMENT 09/24/2016 WITH DR CROITORU FOR FUTURE REFILLS  . diltiazem (DILACOR XR) 180 MG 24 hr capsule TAKE ONE CAPSULE BY MOUTH ONCE DAILY  . dofetilide (TIKOSYN) 500 MCG capsule Take 1 capsule (500 mcg total) by mouth 2 (two) times daily.  . fish oil-omega-3 fatty acids 1000 MG capsule Take 2 g by mouth daily.   . fluticasone (FLONASE) 50 MCG/ACT nasal spray   . hydroxypropyl methylcellulose / hypromellose (ISOPTO TEARS / GONIOVISC) 2.5 % ophthalmic solution Place 1 drop into both eyes as needed for dry eyes.  Marland Kitchen KLOR-CON M10 10 MEQ tablet TAKE ONE TABLET BY MOUTH ONCE DAILY  . lisinopril (PRINIVIL,ZESTRIL) 20 MG tablet Take 1 tablet (20 mg total) by mouth daily.  . nitroGLYCERIN  (NITROSTAT) 0.4 MG SL tablet Place 1 tablet (0.4 mg total) under the tongue every 5 (five) minutes as needed for chest pain.  . rosuvastatin (CRESTOR) 40 MG tablet Take 1 tablet (40 mg total) by mouth daily.  Marland Kitchen venlafaxine XR (EFFEXOR-XR) 150 MG 24 hr capsule TAKE ONE CAPSULE BY MOUTH ONCE DAILY WITH  THE  75  MG  TO  TOTAL  225  MG  DAILY  . venlafaxine XR (EFFEXOR-XR) 75 MG 24 hr capsule TAKE ONE CAPSULE BY MOUTH ONCE DAILY TAKE  WITH  150  MG  TO  EQUAL  A  TOTAL  OF  225  MG  DAILY  . warfarin (COUMADIN) 5 MG tablet Take 1/2 to 1 tablet daily or as directed by coumadin clinic   No facility-administered encounter medications on file as of 11/20/2016.   :  Review of Systems:  Out of a complete 14 point review of systems, all are reviewed and negative with the exception of these symptoms as listed below: Review of Systems  Neurological:       Patient had a sleep study about 10 years ago. He was not placed on CPAP.  Patient states that he has trouble falling asleep without OTC sleep aids, wakes up about every hour to go to the bathroom, snores, witnessed apnea, wakes up feeling tired, daytime fatigue, takes naps.    Epworth Sleepiness Scale 0= would never doze 1= slight chance of dozing 2= moderate chance of dozing 3= high chance of dozing  Sitting and reading:0 Watching TV:2 Sitting inactive in a public place (ex. Theater or meeting):0 As a passenger in a car for an hour without a break:1 Lying down to rest in the afternoon:3 Sitting and talking to someone:0 Sitting quietly after lunch (no alcohol):1 In a car, while stopped in traffic:0 Total:7  Objective:  Neurologic Exam  Physical Exam Physical Examination:   Vitals:   11/20/16 1606  BP: 130/78  Pulse: 76  Resp: 16    General Examination: The patient is a very pleasant 61 y.o. male in no acute distress. He appears well-developed and well-nourished and adequately groomed. He is mildly anxious appearing.  HEENT:  Normocephalic, atraumatic, pupils are equal, round and reactive to light and accommodation. Extraocular  tracking is good without limitation to gaze excursion or nystagmus noted. Normal smooth pursuit is noted. Hearing is grossly intact. Tympanic membranes are clear bilaterally. Face is symmetric with normal facial animation and normal facial sensation. Speech is clear with no dysarthria noted. There is no hypophonia. There is no lip, neck/head, jaw or voice tremor. Neck is supple with full range of passive and active motion. There are no carotid bruits on auscultation. Oropharynx exam reveals: mild mouth dryness, adequate dental hygiene and some missing teeth. moderate airway crowding, due to redundant soft palate and larger uvula. Mallampati is class II. Tongue protrudes centrally and palate elevates symmetrically. Tonsils are 1+. Neck size is 19.25 inches. He has a Mild overbite. Nasal inspection reveals no significant nasal mucosal bogginess or redness and no septal deviation. Is noted to have septal spur.  Chest: Clear to auscultation without wheezing, rhonchi or crackles noted.  Heart: S1+S2+0, regular and normal without murmurs, rubs or gallops noted.   Abdomen: Soft, non-tender and non-distended with normal bowel sounds appreciated on auscultation.  Extremities: There is no pitting edema in the distal lower extremities bilaterally. Pedal pulses are intact.  Skin: Warm and dry without trophic changes noted. Vessel harvested from L arm.  Musculoskeletal: exam reveals no obvious joint deformities, tenderness or joint swelling or erythema.   Neurologically:  Mental status: The patient is awake, alert and oriented in all 4 spheres. His immediate and remote memory, attention, language skills and fund of knowledge are appropriate. There is no evidence of aphasia, agnosia, apraxia or anomia. Speech is clear with normal prosody and enunciation. Thought process is linear. Mood is normal and affect is  normal.  Cranial nerves II - XII are as described above under HEENT exam. In addition: shoulder shrug is normal with equal shoulder height noted. Motor exam: Normal bulk, strength and tone is noted. There is no drift, tremor or rebound. Romberg is negative. Reflexes are 2+ throughout. Babinski: Toes are flexor bilaterally. Fine motor skills and coordination: intact with normal finger taps, normal hand movements, normal rapid alternating patting, normal foot taps and normal foot agility.  Cerebellar testing: No dysmetria or intention tremor on finger to nose testing. Heel to shin is unremarkable bilaterally. There is no truncal or gait ataxia.  Sensory exam: intact to light touch in the upper and lower extremities.  Gait, station and balance: He stands easily. No veering to one side is noted. No leaning to one side is noted. Posture is age-appropriate and stance is narrow based. Gait shows normal stride length and normal pace. No problems turning are noted. Tandem walk is challenging for him.   Assessment and Plan:  In summary, AMAIR QUIST is a very pleasant 61 y.o.-year old male  with an underlying medical history of syncope, bradycardia with status post pacemaker placement, coronary  artery disease with status post stent placement and status post CABG, anxiety, A. Fib with status post ablation twice, hypertension, hyperlipidemia, low testosterone, and obesity, whose history and physical exam are in keeping with obstructive sleep apnea (OSA). I had a long chat with the patient and his wife about my findings and the diagnosis of OSA, its prognosis and treatment options. We talked about medical treatments, surgical interventions and non-pharmacological approaches. I explained in particular the risks and ramifications of untreated moderate to severe OSA, especially with respect to developing cardiovascular disease down the Road, including congestive heart failure, difficult to treat hypertension, cardiac  arrhythmias, or stroke. Even type 2 diabetes has, in part,  been linked to untreated OSA. Symptoms of untreated OSA include daytime sleepiness, memory problems, mood irritability and mood disorder such as depression and anxiety, lack of energy, as well as recurrent headaches, especially morning headaches. We talked about maintaining a healthy lifestyle in general, as well as the importance of weight control. I encouraged the patient to eat healthy, exercise daily and keep well hydrated, to keep a scheduled bedtime and wake time routine, to not skip any meals and eat healthy snacks in between meals. I advised the patient not to drive when feeling sleepy. I recommended the following at this time: sleep study with potential positive airway pressure titration. (We will score hypopneas at 4%).   I explained the sleep test procedure to the patient and also outlined possible surgical and non-surgical treatment options of OSA, including the use of a custom-made dental device (which would require a referral to a specialist dentist or oral surgeon), upper airway surgical options, such as pillar implants, radiofrequency surgery, tongue base surgery, and UPPP (which would involve a referral to an ENT surgeon). Rarely, jaw surgery such as mandibular advancement may be considered.  I also explained the CPAP treatment option to the patient, who indicated that he would be willing to try CPAP if the need arises. I explained the importance of being compliant with PAP treatment, not only for insurance purposes but primarily to improve His symptoms, and for the patient's long term health benefit, including to reduce His cardiovascular risks. I answered all his questions today and the patient and his wife were in agreement. I would like to see him back after the sleep study is completed and encouraged him to call with any interim questions, concerns, problems or updates.   Thank you very much for allowing me to participate in the  care of this nice patient. If I can be of any further assistance to you please do not hesitate to call me at [phone].  Sincerely,   Star Age, MD, PhD

## 2016-11-20 NOTE — Patient Instructions (Signed)

## 2016-11-26 ENCOUNTER — Other Ambulatory Visit: Payer: Self-pay | Admitting: Cardiovascular Disease

## 2016-12-03 ENCOUNTER — Ambulatory Visit (INDEPENDENT_AMBULATORY_CARE_PROVIDER_SITE_OTHER): Payer: BLUE CROSS/BLUE SHIELD | Admitting: Pharmacist

## 2016-12-03 DIAGNOSIS — I480 Paroxysmal atrial fibrillation: Secondary | ICD-10-CM

## 2016-12-03 DIAGNOSIS — Z7901 Long term (current) use of anticoagulants: Secondary | ICD-10-CM

## 2016-12-03 DIAGNOSIS — I2510 Atherosclerotic heart disease of native coronary artery without angina pectoris: Secondary | ICD-10-CM

## 2016-12-03 LAB — POCT INR: INR: 1.9

## 2016-12-06 ENCOUNTER — Encounter: Payer: Self-pay | Admitting: Physician Assistant

## 2016-12-24 ENCOUNTER — Encounter: Payer: BLUE CROSS/BLUE SHIELD | Admitting: *Deleted

## 2016-12-24 ENCOUNTER — Telehealth: Payer: Self-pay | Admitting: Cardiology

## 2016-12-24 DIAGNOSIS — E291 Testicular hypofunction: Secondary | ICD-10-CM | POA: Diagnosis not present

## 2016-12-24 DIAGNOSIS — N401 Enlarged prostate with lower urinary tract symptoms: Secondary | ICD-10-CM | POA: Diagnosis not present

## 2016-12-24 DIAGNOSIS — R351 Nocturia: Secondary | ICD-10-CM | POA: Diagnosis not present

## 2016-12-24 NOTE — Telephone Encounter (Signed)
LMOVM reminding pt to send remote transmission.   

## 2016-12-28 ENCOUNTER — Encounter: Payer: Self-pay | Admitting: Cardiology

## 2016-12-31 ENCOUNTER — Ambulatory Visit (INDEPENDENT_AMBULATORY_CARE_PROVIDER_SITE_OTHER): Payer: BLUE CROSS/BLUE SHIELD | Admitting: Pharmacist

## 2016-12-31 DIAGNOSIS — Z7901 Long term (current) use of anticoagulants: Secondary | ICD-10-CM | POA: Diagnosis not present

## 2016-12-31 DIAGNOSIS — I480 Paroxysmal atrial fibrillation: Secondary | ICD-10-CM

## 2016-12-31 DIAGNOSIS — I2510 Atherosclerotic heart disease of native coronary artery without angina pectoris: Secondary | ICD-10-CM

## 2016-12-31 LAB — POCT INR: INR: 2.5

## 2017-01-01 ENCOUNTER — Telehealth: Payer: Self-pay | Admitting: Pharmacist

## 2017-01-01 NOTE — Telephone Encounter (Signed)
Remote transmission was not received.  LMTCB/sss (HOME) LMTCB/sss (CELL)

## 2017-01-01 NOTE — Telephone Encounter (Signed)
Patient unable to conform if remote information was received after remote download last week.  Please contact patient to follow up.

## 2017-01-02 NOTE — Telephone Encounter (Signed)
lmtcb (cell and home).Lucas Porter

## 2017-01-03 NOTE — Telephone Encounter (Signed)
Spoke w/ pt wife and informed her that pt remote transmission was not received. She stated that wirex had 3 green lights. I asked if she had the phone cord that went from the wirex to the monitor into the jack that read line or phone. She said it was in the jack that read line. I told her to put the line in the jack that read line and try the transmission again. Pt wife verbalized understanding and said she would try again later today. She is not home at this time.

## 2017-01-08 ENCOUNTER — Encounter: Payer: Self-pay | Admitting: Neurology

## 2017-01-10 ENCOUNTER — Ambulatory Visit (INDEPENDENT_AMBULATORY_CARE_PROVIDER_SITE_OTHER): Payer: BLUE CROSS/BLUE SHIELD | Admitting: *Deleted

## 2017-01-10 DIAGNOSIS — I495 Sick sinus syndrome: Secondary | ICD-10-CM

## 2017-01-11 NOTE — Progress Notes (Signed)
Remote pacemaker transmission.   

## 2017-01-15 ENCOUNTER — Encounter: Payer: Self-pay | Admitting: Cardiology

## 2017-01-16 LAB — CUP PACEART REMOTE DEVICE CHECK
Battery Voltage: 2.92 V
Brady Statistic AS VP Percent: 26.69 %
Brady Statistic RA Percent Paced: 25.46 %
Date Time Interrogation Session: 20180427000819
Implantable Lead Implant Date: 20120229
Implantable Lead Location: 753859
Implantable Lead Location: 753860
Implantable Pulse Generator Implant Date: 20120229
Lead Channel Sensing Intrinsic Amplitude: 19.785 mV
Lead Channel Setting Pacing Amplitude: 2.5 V
Lead Channel Setting Pacing Pulse Width: 0.4 ms
Lead Channel Setting Sensing Sensitivity: 0.9 mV
MDC IDC LEAD IMPLANT DT: 20120229
MDC IDC MSMT LEADCHNL RA IMPEDANCE VALUE: 528 Ohm
MDC IDC MSMT LEADCHNL RA SENSING INTR AMPL: 2.86 mV
MDC IDC MSMT LEADCHNL RV IMPEDANCE VALUE: 584 Ohm
MDC IDC SET LEADCHNL RA PACING AMPLITUDE: 2 V
MDC IDC STAT BRADY AP VP PERCENT: 2.1 %
MDC IDC STAT BRADY AP VS PERCENT: 29.16 %
MDC IDC STAT BRADY AS VS PERCENT: 42.05 %
MDC IDC STAT BRADY RV PERCENT PACED: 28.38 %

## 2017-01-29 ENCOUNTER — Encounter: Payer: Self-pay | Admitting: Cardiology

## 2017-02-12 ENCOUNTER — Ambulatory Visit (INDEPENDENT_AMBULATORY_CARE_PROVIDER_SITE_OTHER): Payer: BLUE CROSS/BLUE SHIELD | Admitting: Pharmacist Clinician (PhC)/ Clinical Pharmacy Specialist

## 2017-02-12 DIAGNOSIS — Z7901 Long term (current) use of anticoagulants: Secondary | ICD-10-CM

## 2017-02-12 DIAGNOSIS — I2510 Atherosclerotic heart disease of native coronary artery without angina pectoris: Secondary | ICD-10-CM

## 2017-02-12 DIAGNOSIS — I480 Paroxysmal atrial fibrillation: Secondary | ICD-10-CM

## 2017-02-12 LAB — POCT INR: INR: 2.9

## 2017-03-04 ENCOUNTER — Other Ambulatory Visit: Payer: Self-pay | Admitting: Cardiovascular Disease

## 2017-03-04 ENCOUNTER — Other Ambulatory Visit: Payer: Self-pay | Admitting: Physician Assistant

## 2017-03-04 DIAGNOSIS — I10 Essential (primary) hypertension: Secondary | ICD-10-CM

## 2017-03-04 NOTE — Telephone Encounter (Signed)
REFILL 

## 2017-03-25 DIAGNOSIS — E291 Testicular hypofunction: Secondary | ICD-10-CM | POA: Diagnosis not present

## 2017-04-06 ENCOUNTER — Other Ambulatory Visit: Payer: Self-pay | Admitting: Cardiovascular Disease

## 2017-04-11 ENCOUNTER — Ambulatory Visit (INDEPENDENT_AMBULATORY_CARE_PROVIDER_SITE_OTHER): Payer: BLUE CROSS/BLUE SHIELD | Admitting: *Deleted

## 2017-04-11 ENCOUNTER — Telehealth: Payer: Self-pay | Admitting: Cardiology

## 2017-04-11 DIAGNOSIS — I495 Sick sinus syndrome: Secondary | ICD-10-CM

## 2017-04-11 NOTE — Telephone Encounter (Signed)
LMOVM reminding pt to send remote transmission.   

## 2017-04-12 ENCOUNTER — Encounter: Payer: Self-pay | Admitting: Cardiology

## 2017-04-12 NOTE — Progress Notes (Signed)
Remote pacemaker transmission.   

## 2017-04-17 ENCOUNTER — Telehealth: Payer: Self-pay | Admitting: Physician Assistant

## 2017-04-17 NOTE — Telephone Encounter (Signed)
This is a relative, not an absolute contraindication.  Is this a new medication/dose of venlafaxine? As far as I can tell it's exactly the same dose that he was receiving when I saw him in January 2018. His QT interval at that time was great, in fact relatively short. As long as the doses of the medications are not changing, rechecking his QT interval every 6 months or so should be appropriate. In fact, this is a good opportunity to repeat his ECG since it's been about 6 months since the last tracing. If you have the ability to do that in your office, please go ahead and I'll take a look at it. If you can't get it done, I'll be happy to bring him in just to have a tracing performed. He is also due an appointment with me in the near future, but I don't see one scheduled yet. Thanks. Lissie Hinesley

## 2017-04-17 NOTE — Telephone Encounter (Signed)
Fax from pharmacy that venlafaxine prescribed by me and dofetilide prescribed by Dr. Sallyanne Kuster are associated with a risk of QT prolongation and torsades de pointes.  I last saw him in 10/2016. He is on a high dose of venlafaxine. He does get regular monitoring by cardiology due to AFib. Will ask for cardiology's opinion before considering a change in treatment.

## 2017-04-18 NOTE — Telephone Encounter (Signed)
Mihai,  Corrrect, it's a stable dose, and I believe that it is working well. He's scheduled to see me in follow-up on 8/21, and I will plan to do and EKG then and forward it to you for review.  I will also remind him that it's time to see you and encourage him to schedule.  Thank you so very much! Warmly, Johnmichael Melhorn

## 2017-04-26 IMAGING — CR DG ABDOMEN 1V
1 series · 1 of 1 positions shown · non-contrast
Comparison: Lumbar spine radiographs 05/13/2012

CLINICAL DATA: Left rib pain after being kicked by pet llama last
night.

EXAM:
ABDOMEN - 1 VIEW

[AP]
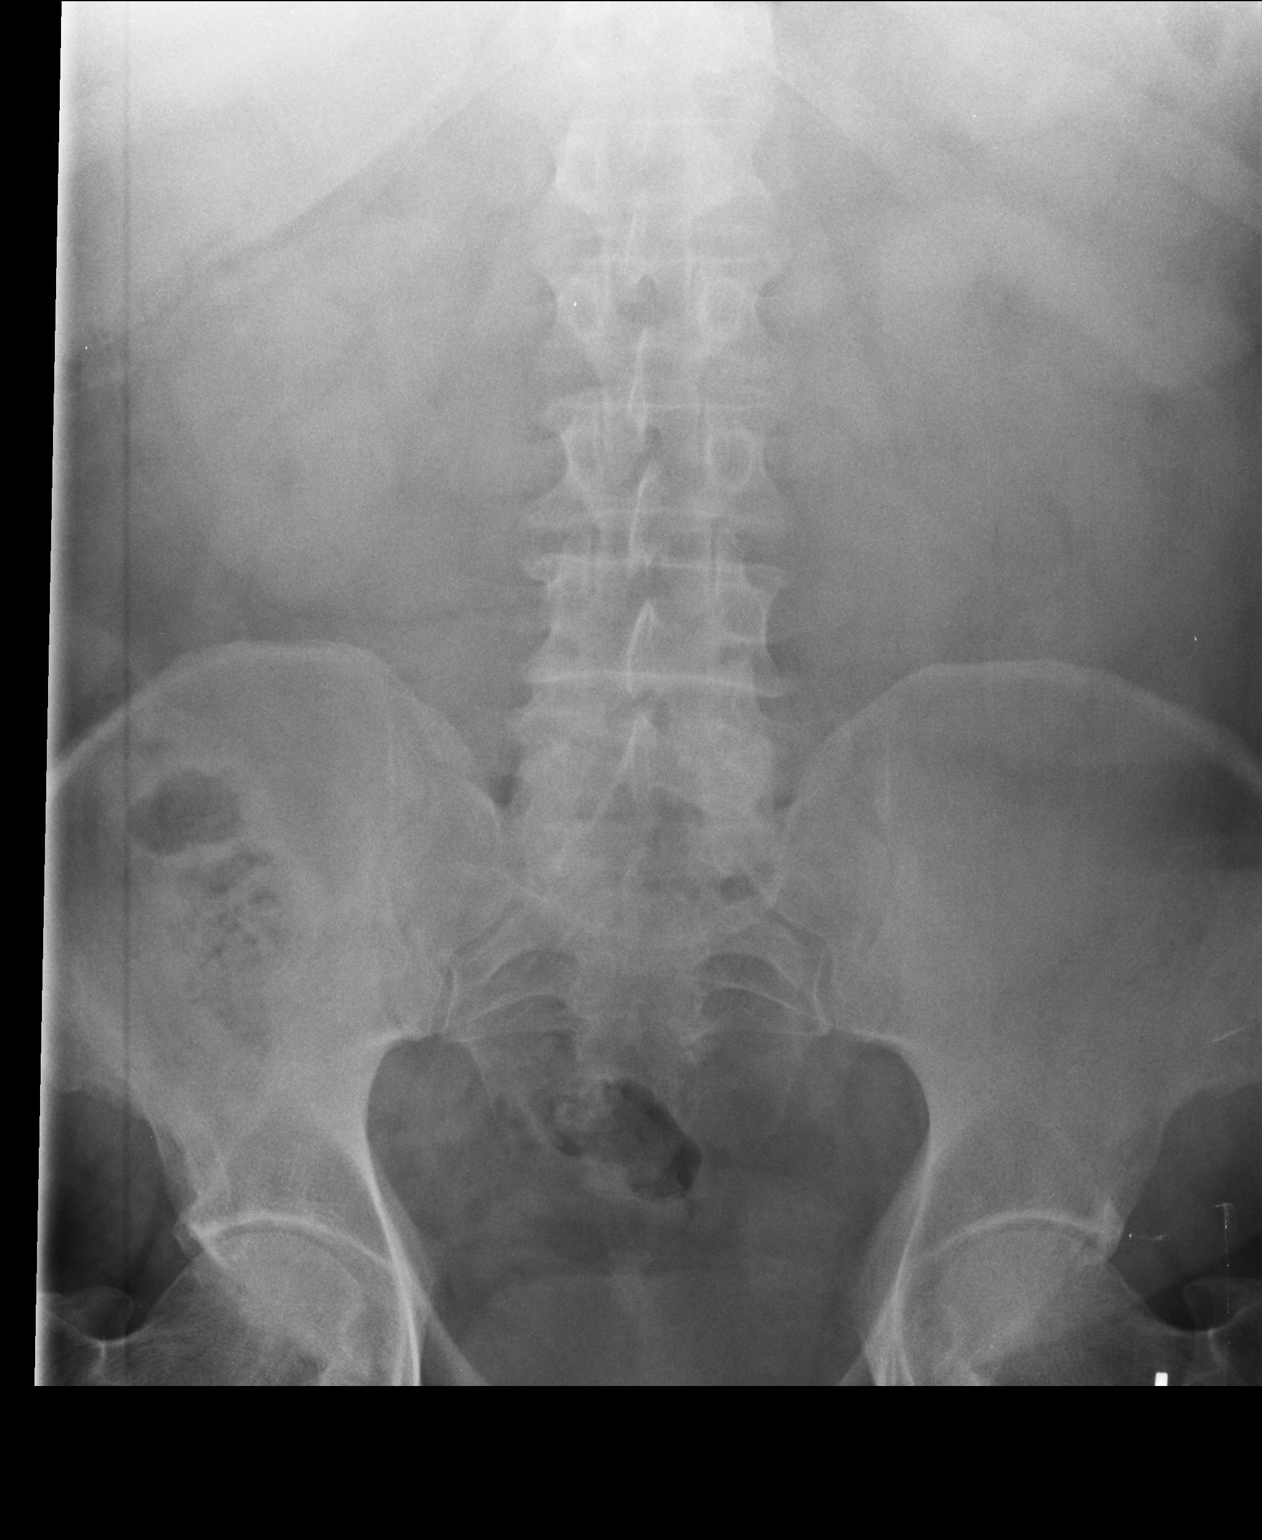

[1 of 1 positions shown; findings below may reference images not displayed]

FINDINGS: Rib radiographs are dictated separately. The abdomen is nearly
gasless. The bowel gas pattern appears otherwise unremarkable. There
are mild degenerative changes throughout the lumbar spine. No
evidence of fracture or suspicious abdominal calcification.
IMPRESSION: No acute findings within the abdomen. Rib radiographs dictated
separately.

## 2017-04-29 ENCOUNTER — Ambulatory Visit (INDEPENDENT_AMBULATORY_CARE_PROVIDER_SITE_OTHER): Payer: BLUE CROSS/BLUE SHIELD | Admitting: Pharmacist Clinician (PhC)/ Clinical Pharmacy Specialist

## 2017-04-29 DIAGNOSIS — Z7901 Long term (current) use of anticoagulants: Secondary | ICD-10-CM | POA: Diagnosis not present

## 2017-04-29 DIAGNOSIS — I480 Paroxysmal atrial fibrillation: Secondary | ICD-10-CM | POA: Diagnosis not present

## 2017-04-29 DIAGNOSIS — I2510 Atherosclerotic heart disease of native coronary artery without angina pectoris: Secondary | ICD-10-CM

## 2017-04-29 LAB — POCT INR: INR: 2.8

## 2017-05-07 ENCOUNTER — Ambulatory Visit (INDEPENDENT_AMBULATORY_CARE_PROVIDER_SITE_OTHER): Payer: Medicare Other | Admitting: Physician Assistant

## 2017-05-07 ENCOUNTER — Encounter: Payer: Self-pay | Admitting: Nurse Practitioner

## 2017-05-07 ENCOUNTER — Encounter: Payer: Self-pay | Admitting: Physician Assistant

## 2017-05-07 VITALS — BP 135/80 | HR 75 | Temp 97.6°F | Resp 18 | Ht 70.0 in | Wt 232.2 lb

## 2017-05-07 DIAGNOSIS — I2510 Atherosclerotic heart disease of native coronary artery without angina pectoris: Secondary | ICD-10-CM

## 2017-05-07 DIAGNOSIS — I10 Essential (primary) hypertension: Secondary | ICD-10-CM | POA: Diagnosis not present

## 2017-05-07 DIAGNOSIS — D689 Coagulation defect, unspecified: Secondary | ICD-10-CM

## 2017-05-07 DIAGNOSIS — Z1211 Encounter for screening for malignant neoplasm of colon: Secondary | ICD-10-CM

## 2017-05-07 DIAGNOSIS — F330 Major depressive disorder, recurrent, mild: Secondary | ICD-10-CM | POA: Diagnosis not present

## 2017-05-07 DIAGNOSIS — E785 Hyperlipidemia, unspecified: Secondary | ICD-10-CM

## 2017-05-07 DIAGNOSIS — E291 Testicular hypofunction: Secondary | ICD-10-CM

## 2017-05-07 MED ORDER — NITROGLYCERIN 0.4 MG SL SUBL: 0.4000 mg | SUBLINGUAL_TABLET | SUBLINGUAL | 3 refills | Status: DC | PRN

## 2017-05-07 MED ORDER — LISINOPRIL 20 MG PO TABS: 20.0000 mg | ORAL_TABLET | Freq: Every day | ORAL | 3 refills | Status: DC

## 2017-05-07 NOTE — Assessment & Plan Note (Signed)
Continue to control BP and lipids. Will ask Dr. Sallyanne Kuster to review the EKG today. Patient to schedule follow-up with cardiology.

## 2017-05-07 NOTE — Patient Instructions (Addendum)
Please call [facility] to schedule the MRI of the brain. The order is in the system, you just need to schedule it. [phone]  Please call Dr. Victorino December office to schedule a follow-up with him.    IF you received an x-ray today, you will receive an invoice from [facility]. Please contact [facility] at [phone] with questions or concerns regarding your invoice.   IF you received labwork today, you will receive an invoice from [facility]. Please contact [facility] at [phone] with questions or concerns regarding your invoice.   Our billing staff will not be able to assist you with questions regarding bills from these companies.  You will be contacted with the lab results as soon as they are available. The fastest way to get your results is to activate your My Chart account. Instructions are located on the last page of this paperwork. If you have not heard from [place] regarding the results in 2 weeks, please contact this office.

## 2017-05-07 NOTE — Progress Notes (Addendum)
Patient ID: ELEAZAR KIMMEY, male    DOB: June 14, 1956, 61 y.o.   MRN: [id number]  PCP: Harrison Mons, PA-C  Chief Complaint  Patient presents with  . Hypertension    pt states he hasn't been checking BP at home.  . Follow-up    6 month follow up    Subjective:   Presents for evaluation of HTN. He is accompanied by his wife.  In addition, we need an EKG due to his use of venlafaxine, which can increase QT and risk of torsades. I reached out to his cardiologist, who reviewed the previous tracings, which have been stable, and advised updating the EKG.  He is tolerating the venlaxfaxine well, no adverse effects. He believes it is really helping his anxiety. He desires to continue it, as long as cardiology is in agreement.  Testosterone was VERY low (69) 6 months ago. MR brain was never scheduled, though ordered. He's not sure if it was scheduled and cancelled. Referred to urology, and labs were updated, though those results are not available to me. Oxybutynin for nocturia is helping. He still gets up a couple of times during the night to urinate. He isn't sure what the follow-up plan is.  Tired. Loss of good friend recently has increased is depression, but it's improving. He has good social support. Less able to play with his grandchildren, fatigues easily. Spending more time watching TV. Erectile dysfunction persists.   Depression screen [facility] 2/9 05/07/2017 11/07/2016 07/11/2016 10/12/2015 04/28/2015  Decreased Interest 0 0 0 0 0  Down, Depressed, Hopeless 0 0 0 0 0  PHQ - 2 Score 0 0 0 0 0     Review of Systems  Constitutional: Positive for activity change and fatigue. Negative for appetite change, chills, diaphoresis, fever and unexpected weight change.  Respiratory: Negative for cough and shortness of breath.   Cardiovascular: Negative for chest pain, palpitations and leg swelling.  Gastrointestinal: Negative for diarrhea, nausea and vomiting.  Endocrine: Negative for  polydipsia.  Genitourinary: Negative for dysuria, frequency and urgency.  Musculoskeletal: Negative for myalgias.  Skin: Negative for rash.  Neurological: Negative for dizziness and headaches.  Psychiatric/Behavioral: Positive for dysphoric mood. Negative for agitation, behavioral problems, confusion, decreased concentration, hallucinations, self-injury, sleep disturbance and suicidal ideas. The patient is nervous/anxious. The patient is not hyperactive.     Patient Active Problem List   Diagnosis Date Noted  . Major depressive disorder, recurrent episode, mild ([place]) 05/07/2017  . Morbid obesity ([place]) 10/12/2015  . 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 Symptomatic bradycardia 03/09/2013  . Long term (current) use of anticoagulants 12/03/2012  . BMI 40.0-44.9, adult ([place]) 05/13/2012  . Atrial flutter ([place]) 04/24/2012  . S/P coronary artery stent placement to distal Lt. main and Lt. Cx, 10/17/11 10/19/2011  . Angina pectoris 10/19/2011  . Coagulopathy ([place]) 10/19/2011  . History of pacemaker, with a-v pacing 10/19/2011  . CAD (coronary artery disease) with CABG 2011, LIMA to LAD, Free radial to Lt. PDA, VG to Diag and ramus intermedius 10/02/2011  . Anxiety 10/02/2011  . Paroxysmal atrial fibrillation ([place]) 10/02/2011  . Hypertension 10/02/2011  . Dyslipidemia 10/02/2011  . Hypogonadism male 10/02/2011     Prior to Admission medications   Medication Sig Start Date End Date Taking? Authorizing Provider  atenolol (TENORMIN) 100 MG tablet Take 1 tablet (100 mg total) by mouth daily. 11/07/16  Yes Daniah Zaldivar, PA-C  clonazePAM (KLONOPIN) 0.5 MG tablet Take 1 tablet (0.5 mg total) by mouth 2 (two)  times daily as needed for anxiety. 12/13/14  Yes Copland, Gay Filler, MD  clopidogrel (PLAVIX) 75 MG tablet TAKE 1 TABLET BY MOUTH ONCE DAILY 03/04/17  Yes Croitoru, Mihai, MD  diltiazem (DILACOR XR) 180 MG 24 hr capsule TAKE ONE CAPSULE BY MOUTH ONCE DAILY 10/05/16  Yes Croitoru, Mihai, MD    dofetilide (TIKOSYN) 500 MCG capsule Take 1 capsule (500 mcg total) by mouth 2 (two) times daily. 10/09/16  Yes Croitoru, Mihai, MD  fish oil-omega-3 fatty acids 1000 MG capsule Take 2 g by mouth daily.    Yes [provider]  fluticasone Asencion Islam) 50 MCG/ACT nasal spray  06/18/16  Yes [provider]  hydroxypropyl methylcellulose / hypromellose (ISOPTO TEARS / GONIOVISC) 2.5 % ophthalmic solution Place 1 drop into both eyes as needed for dry eyes.   Yes [provider]  KLOR-CON M10 10 MEQ tablet TAKE ONE TABLET BY MOUTH ONCE DAILY 10/05/16  Yes Croitoru, Mihai, MD  lisinopril (PRINIVIL,ZESTRIL) 20 MG tablet TAKE 1 TABLET BY MOUTH ONCE DAILY 03/05/17  Yes Nayson Traweek, PA-C  nitroGLYCERIN (NITROSTAT) 0.4 MG SL tablet Place 1 tablet (0.4 mg total) under the tongue every 5 (five) minutes as needed for chest pain. 05/01/13  Yes Allred, Jeneen Rinks, MD  rosuvastatin (CRESTOR) 40 MG tablet Take 1 tablet (40 mg total) by mouth daily. 11/07/16  Yes Jaicob Dia, PA-C  venlafaxine XR (EFFEXOR-XR) 150 MG 24 hr capsule TAKE ONE CAPSULE BY MOUTH ONCE DAILY WITH  THE  75  MG  TO  TOTAL  225  MG  DAILY 07/11/16  Yes Breezy Hertenstein, PA-C  venlafaxine XR (EFFEXOR-XR) 75 MG 24 hr capsule TAKE ONE CAPSULE BY MOUTH ONCE DAILY TAKE  WITH  150  MG  TO  EQUAL  A  TOTAL  OF  225  MG  DAILY 07/11/16  Yes Mearle Drew, PA-C  warfarin (COUMADIN) 5 MG tablet TAKE 1/2 TO 1 (ONE-HALF TO ONE) TABLET BY MOUTH ONCE DAILY OR  AS  DIRECTED  BY  COUMADIN  CLINIC 04/08/17  Yes Croitoru, Mihai, MD     No Known Allergies     Objective:  Physical Exam  Constitutional: He is oriented to person, place, and time. He appears well-developed and well-nourished. He is active and cooperative. No distress.  BP 135/80   Pulse 75   Temp 97.6 F (36.4 C) (Oral)   Resp 18   Ht 5\' 10"  (1.778 m)   Wt 232 lb 3.2 oz (105.3 kg)   SpO2 96%   BMI 33.32 kg/m   HENT:  Head: Normocephalic and atraumatic.  Right Ear:  Hearing normal.  Left Ear: Hearing normal.  Eyes: Conjunctivae are normal. No scleral icterus.  Neck: Normal range of motion. Neck supple. No thyromegaly present.  Cardiovascular: Normal rate, regular rhythm and normal heart sounds.   Pulses:      Radial pulses are 2+ on the right side, and 2+ on the left side.  Pulmonary/Chest: Effort normal and breath sounds normal.  Lymphadenopathy:       Head (right side): No tonsillar, no preauricular, no posterior auricular and no occipital adenopathy present.       Head (left side): No tonsillar, no preauricular, no posterior auricular and no occipital adenopathy present.    He has no cervical adenopathy.       Right: No supraclavicular adenopathy present.       Left: No supraclavicular adenopathy present.  Neurological: He is alert and oriented to person, place, and time. No  sensory deficit.  Skin: Skin is warm, dry and intact. No rash noted. No cyanosis or erythema. Nails show no clubbing.  Psychiatric: He has a normal mood and affect. His speech is normal and behavior is normal.   EKG reviewed with Dr. Tamala Julian. Rate 64. PR 218. QT 414.  Pacemaker spikes, as expected. No other significant change from 09/2016 tracing.     Assessment & Plan:   Problem List Items Addressed This Visit    CAD (coronary artery disease) with CABG 2011, LIMA to LAD, Free radial to Lt. PDA, VG to Diag and ramus intermedius (Chronic)    Continue to control BP and lipids. Will ask Dr. Sallyanne Kuster to review the EKG today. Patient to schedule follow-up with cardiology.      Relevant Medications   lisinopril (PRINIVIL,ZESTRIL) 20 MG tablet   nitroGLYCERIN (NITROSTAT) 0.4 MG SL tablet   Hypertension - Primary    Generally well controlled.      Relevant Medications   lisinopril (PRINIVIL,ZESTRIL) 20 MG tablet   nitroGLYCERIN (NITROSTAT) 0.4 MG SL tablet   Other Relevant Orders   EKG 12-Lead (Completed)   CBC with Differential/Platelet   Comprehensive metabolic panel     TSH   Dyslipidemia   Relevant Orders   Lipid panel   Hypogonadism male    Update testosterone level today. Provided phone number for patient to call [facility] to schedule the ordered test. Will forward lab results to Dr. Jeffie Pollock for additional evaluation/treatment recommendations and follow-up.       Relevant Orders   Testosterone   Coagulopathy (HCC)   Morbid obesity (HCC)    Fatigue is obstacle in getting more exercise. Hopefully correction of the low testosterone can help in this area.      Major depressive disorder, recurrent episode, mild (HCC)    Desire continued use of venlafaxine. As long as cardiology is in agreement, plan to continue.       Other Visit Diagnoses    Screening for colon cancer       Relevant Orders   Ambulatory referral to Gastroenterology   Morbid obesity due to excess calories ([place])   (Chronic)         Return in about 6 months (around 11/07/2017) for re-evaluation of cholesterol, blood pressure, mood.   Fara Chute, PA-C [facility]

## 2017-05-07 NOTE — Assessment & Plan Note (Signed)
Fatigue is obstacle in getting more exercise. Hopefully correction of the low testosterone can help in this area.

## 2017-05-07 NOTE — Assessment & Plan Note (Signed)
Update testosterone level today. Provided phone number for patient to call [facility] to schedule the ordered test. Will forward lab results to Dr. Jeffie Pollock for additional evaluation/treatment recommendations and follow-up.

## 2017-05-07 NOTE — Assessment & Plan Note (Signed)
Desire continued use of venlafaxine. As long as cardiology is in agreement, plan to continue.

## 2017-05-07 NOTE — Assessment & Plan Note (Signed)
Generally well controlled.

## 2017-05-08 LAB — COMPREHENSIVE METABOLIC PANEL
ALT: 30 IU/L (ref 0–44)
AST: 39 IU/L (ref 0–40)
Albumin/Globulin Ratio: 1.9 (ref 1.2–2.2)
Albumin: 4.6 g/dL (ref 3.6–4.8)
Alkaline Phosphatase: 121 IU/L — ABNORMAL HIGH (ref 39–117)
BILIRUBIN TOTAL: 0.4 mg/dL (ref 0.0–1.2)
BUN/Creatinine Ratio: 16 (ref 10–24)
BUN: 14 mg/dL (ref 8–27)
CALCIUM: 9.7 mg/dL (ref 8.6–10.2)
CHLORIDE: 98 mmol/L (ref 96–106)
CO2: 24 mmol/L (ref 20–29)
Creatinine, Ser: 0.85 mg/dL (ref 0.76–1.27)
GFR, EST AFRICAN AMERICAN: 109 mL/min/{1.73_m2} (ref >59)
GFR, EST NON AFRICAN AMERICAN: 94 mL/min/{1.73_m2} (ref >59)
GLUCOSE: 188 mg/dL — AB (ref 65–99)
Globulin, Total: 2.4 g/dL (ref 1.5–4.5)
POTASSIUM: 4.6 mmol/L (ref 3.5–5.2)
Sodium: 138 mmol/L (ref 134–144)
TOTAL PROTEIN: 7 g/dL (ref 6.0–8.5)

## 2017-05-08 LAB — CUP PACEART REMOTE DEVICE CHECK
Battery Voltage: 2.92 V
Brady Statistic AS VS Percent: 33.63 %
Implantable Lead Implant Date: 20120229
Implantable Lead Location: 753860
Lead Channel Impedance Value: 552 Ohm
Lead Channel Sensing Intrinsic Amplitude: 0.792 mV
MDC IDC LEAD IMPLANT DT: 20120229
MDC IDC LEAD LOCATION: 753859
MDC IDC MSMT LEADCHNL RA IMPEDANCE VALUE: 576 Ohm
MDC IDC MSMT LEADCHNL RV SENSING INTR AMPL: 19.444 mV
MDC IDC PG IMPLANT DT: 20120229
MDC IDC SESS DTM: 20180727011122
MDC IDC SET LEADCHNL RA PACING AMPLITUDE: 2 V
MDC IDC SET LEADCHNL RV PACING AMPLITUDE: 2.5 V
MDC IDC SET LEADCHNL RV PACING PULSEWIDTH: 0.4 ms
MDC IDC SET LEADCHNL RV SENSING SENSITIVITY: 0.9 mV
MDC IDC STAT BRADY AP VP PERCENT: 2.38 %
MDC IDC STAT BRADY AP VS PERCENT: 42.64 %
MDC IDC STAT BRADY AS VP PERCENT: 21.35 %
MDC IDC STAT BRADY RA PERCENT PACED: 38.98 %
MDC IDC STAT BRADY RV PERCENT PACED: 23.38 %

## 2017-05-08 LAB — CBC WITH DIFFERENTIAL/PLATELET
BASOS: 0 %
Basophils Absolute: 0 10*3/uL (ref 0.0–0.2)
EOS (ABSOLUTE): 0.1 10*3/uL (ref 0.0–0.4)
EOS: 1 %
Hematocrit: 41.5 % (ref 37.5–51.0)
Hemoglobin: 13.4 g/dL (ref 13.0–17.7)
IMMATURE GRANS (ABS): 0 10*3/uL (ref 0.0–0.1)
IMMATURE GRANULOCYTES: 0 %
LYMPHS: 21 %
Lymphocytes Absolute: 1.6 10*3/uL (ref 0.7–3.1)
MCH: 28.8 pg (ref 26.6–33.0)
MCHC: 32.3 g/dL (ref 31.5–35.7)
MCV: 89 fL (ref 79–97)
MONOCYTES: 6 %
Monocytes Absolute: 0.5 10*3/uL (ref 0.1–0.9)
NEUTROS PCT: 72 %
Neutrophils Absolute: 5.5 10*3/uL (ref 1.4–7.0)
PLATELETS: 190 10*3/uL (ref 150–379)
RBC: 4.66 x10E6/uL (ref 4.14–5.80)
RDW: 14.8 % (ref 12.3–15.4)
WBC: 7.6 10*3/uL (ref 3.4–10.8)

## 2017-05-08 LAB — TESTOSTERONE: Testosterone: 89 ng/dL — ABNORMAL LOW (ref 264–916)

## 2017-05-08 LAB — LIPID PANEL
Chol/HDL Ratio: 4 ratio (ref 0.0–5.0)
Cholesterol, Total: 135 mg/dL (ref 100–199)
HDL: 34 mg/dL — AB (ref >39)
LDL Calculated: 57 mg/dL (ref 0–99)
TRIGLYCERIDES: 219 mg/dL — AB (ref 0–149)
VLDL CHOLESTEROL CAL: 44 mg/dL — AB (ref 5–40)

## 2017-05-08 LAB — TSH: TSH: 5.5 u[IU]/mL — ABNORMAL HIGH (ref 0.450–4.500)

## 2017-05-08 NOTE — Progress Notes (Signed)
Thanks, [name]. Reviewed his EKG, looks okay. Lucas Porter

## 2017-05-09 ENCOUNTER — Telehealth: Payer: Self-pay | Admitting: Physician Assistant

## 2017-05-09 NOTE — Telephone Encounter (Signed)
Pt's wife called concerning MRI that was ordered. The most recent I see is from November of 2017. She said the pt has a pacemaker and [facility] is unable to do the MRI because of this. I called [facility] and they said we would have to fax demographics, the order, and pt pacemaker information to them for review to see if they could schedule pt. I left a message for the pt and his wife to let them know this and see if they knew if we had information concerning his pacemaker that we could fax. I later saw that this info had been scanned into pt's chart yesterday. Will work on obtaining prior auth and then Technical sales engineer to [employer]. If there are any issues with this or any other suggestions, please let me know. The pt's wife can be contacted at [phone]. Thanks!

## 2017-05-09 NOTE — Telephone Encounter (Signed)
If, based on the information we send, radiology says they cannot do the MRI, then let me know. I can reach out to the radiologist, and/or urology, regarding their recommendations for imaging the pituitary gland in this situation.

## 2017-05-13 ENCOUNTER — Encounter: Payer: Self-pay | Admitting: Cardiology

## 2017-05-21 ENCOUNTER — Encounter: Payer: Self-pay | Admitting: Nurse Practitioner

## 2017-05-21 ENCOUNTER — Telehealth: Payer: Self-pay

## 2017-05-21 ENCOUNTER — Ambulatory Visit (INDEPENDENT_AMBULATORY_CARE_PROVIDER_SITE_OTHER): Payer: BLUE CROSS/BLUE SHIELD | Admitting: Nurse Practitioner

## 2017-05-21 ENCOUNTER — Ambulatory Visit (HOSPITAL_COMMUNITY)
Admission: RE | Admit: 2017-05-21 | Discharge: 2017-05-21 | Disposition: A | Discharge status: Home / Self Care | Payer: BLUE CROSS/BLUE SHIELD | Source: Ambulatory Visit | Attending: Physician Assistant | Admitting: Physician Assistant

## 2017-05-21 VITALS — BP 122/70 | HR 69 | Ht 70.0 in | Wt 235.0 lb

## 2017-05-21 DIAGNOSIS — E291 Testicular hypofunction: Secondary | ICD-10-CM | POA: Diagnosis not present

## 2017-05-21 DIAGNOSIS — Z7901 Long term (current) use of anticoagulants: Secondary | ICD-10-CM | POA: Diagnosis not present

## 2017-05-21 DIAGNOSIS — I6782 Cerebral ischemia: Secondary | ICD-10-CM | POA: Insufficient documentation

## 2017-05-21 DIAGNOSIS — Z1211 Encounter for screening for malignant neoplasm of colon: Secondary | ICD-10-CM

## 2017-05-21 DIAGNOSIS — Z800 Family history of malignant neoplasm of digestive organs: Secondary | ICD-10-CM | POA: Diagnosis not present

## 2017-05-21 DIAGNOSIS — R635 Abnormal weight gain: Secondary | ICD-10-CM | POA: Diagnosis not present

## 2017-05-21 MED ORDER — NA SULFATE-K SULFATE-MG SULF 17.5-3.13-1.6 GM/177ML PO SOLN: 1.0000 | Freq: Once | ORAL | 0 refills | Status: AC

## 2017-05-21 MED ORDER — GADOBENATE DIMEGLUMINE 529 MG/ML IV SOLN
15.0000 mL | Freq: Once | INTRAVENOUS | Status: AC | PRN
  Administered 2017-05-21: 15 mL via INTRAVENOUS

## 2017-05-21 NOTE — Telephone Encounter (Signed)
Kristen,  Looks like pt is taking Plavix along with Coumadin. What are your instructions on holding the Plavix before his colonoscopy that is scheduled in October?  Thank you

## 2017-05-21 NOTE — Telephone Encounter (Signed)
Faxed to [facility] at given number

## 2017-05-21 NOTE — Patient Instructions (Signed)
If you are age 61 or older, your body mass index should be between 23-30. Your Body mass index is 33.72 kg/m. If this is out of the aforementioned range listed, please consider follow up with your Primary Care Provider.  If you are age 75 or younger, your body mass index should be between 19-25. Your Body mass index is 33.72 kg/m. If this is out of the aformentioned range listed, please consider follow up with your Primary Care Provider.   You have been scheduled for a colonoscopy. Please follow written instructions given to you at your visit today.  Please pick up your prep supplies at the pharmacy within the next 1-3 days. If you use inhalers (even only as needed), please bring them with you on the day of your procedure. Your physician has requested that you go to www.startemmi.com and enter the access code given to you at your visit today. This web site gives a general overview about your procedure. However, you should still follow specific instructions given to you by our office regarding your preparation for the procedure.  We have sent the following medications to your pharmacy for you to pick up at your convenience:  Suprep

## 2017-05-21 NOTE — Progress Notes (Signed)
HPI:  Patient is a 61 year old male referred by PCP, Harrison Mons, PA for colon cancer screening. Patient has never had a colonoscopy. He is on chronic coumadin and plavix. Patient has atrial fibrillation. He has a pacemaker. He has a history of CABG in 2010/stent in 2011. He is followed by [facility]. No palpitations, no chest pain. He has chronic shortness of breath with exertion . Patient has no GI complaints. Specifically he has no bowel changes, blood in stool, unexpected weight loss or abdominal pain. His mother is deceased from colon cancer diagnosed in her early 8  Past Medical History:  Diagnosis Date  . Anxiety   . Atrial fibrillation (HCC)    paroxysmal  . Atrial flutter ([place])   . Coagulopathy ([place])    persistent elevation of ACT during 10/17/11 hospitalization  . Coronary artery disease    s/p CABG 2010, s/p Successful PCI  10/17/11 of Distal Left Main and the Left Circumflex with a Promus Element DES stent -- 4.0 mm x 16 mm postdilated to 4.12 mm   . DDD (degenerative disc disease)   . Depression   . Dysrhythmia    afib  . Facial asymmetry 2010   pt. stated it was very temporary, never seen by doctor for it.   Marland Kitchen GERD (gastroesophageal reflux disease)   . Heart murmur   . Hyperlipidemia   . Hypertension   . Kidney stones   . Presence of permanent cardiac pacemaker    Medtronic   . S/P coronary artery stent placement to distal Lt. main and Lt. Cx 10/17/11  . 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 ATRIAL FIBRILLATION ABLATION  04/24/12, 12/02/12   PVI x2 and CTI ablation by Dr Rayann Heman  . ATRIAL FIBRILLATION ABLATION N/A 04/24/2012   Procedure: ATRIAL FIBRILLATION ABLATION;  Surgeon: Thompson Grayer, MD;  Location: [facility] CATH LAB;  Service: Cardiovascular;  Laterality: N/A;  . ATRIAL FIBRILLATION ABLATION N/A 12/02/2012   Procedure: ATRIAL FIBRILLATION ABLATION;  Surgeon:  Thompson Grayer, MD;  Location: [facility] CATH LAB;  Service: Cardiovascular;  Laterality: N/A;  . CORONARY ANGIOPLASTY  10/19/11  . CORONARY ARTERY BYPASS GRAFT  2010  . CYSTOSCOPY  2007 ?   treatment for Kidney stones   . INGUINAL HERNIA REPAIR Left 01/04/2016   Procedure: LAPAROSCOPIC LEFT INGUINAL HERNIA WITH MESH;  Surgeon: Ralene Ok, MD;  Location: [place];  Service: General;  Laterality: Left;  . INSERT / REPLACE / REMOVE PACEMAKER  2/12   Medtronic  . INSERTION OF MESH Left 01/04/2016   Procedure: INSERTION OF MESH;  Surgeon: Ralene Ok, MD;  Location: [place];  Service: General;  Laterality: Left;  . LAPAROSCOPIC LYSIS OF ADHESIONS Left 01/04/2016   Procedure: LAPAROSCOPIC LYSIS OF ADHESIONS;  Surgeon: Ralene Ok, MD;  Location: [place];  Service: General;  Laterality: Left;  . LEFT HEART CATHETERIZATION WITH CORONARY/GRAFT ANGIOGRAM N/A 10/17/2011   Procedure: LEFT HEART CATHETERIZATION WITH Beatrix Fetters;  Surgeon: [name], MD;  Location: [place] CATH LAB;  Service: Cardiovascular;  Laterality: N/A;  . LEFT HEART CATHETERIZATION WITH CORONARY/GRAFT ANGIOGRAM N/A 02/08/2012   Procedure: LEFT HEART CATHETERIZATION WITH Beatrix Fetters;  Surgeon: [name], MD;  Location: [place] CATH LAB;  Service: Cardiovascular;  Laterality: N/A;  . NM MYOCAR PERF WALL MOTION  03/23/2011   Low risk  . PERCUTANEOUS CORONARY STENT INTERVENTION (PCI-S)  10/17/2011  Procedure: PERCUTANEOUS CORONARY STENT INTERVENTION (PCI-S);  Surgeon: [name], MD;  Location: [facility] CATH LAB;  Service: Cardiovascular;;  . TEE WITHOUT CARDIOVERSION  04/23/2012   Procedure: TRANSESOPHAGEAL ECHOCARDIOGRAM (TEE);  Surgeon: Josue Hector, MD;  Location: [facility] ENDOSCOPY;  Service: Cardiovascular;  Laterality: N/A;  spoke to pt about time change from 11a to 12p/DL  . TEE WITHOUT CARDIOVERSION N/A 12/01/2012   Procedure: TRANSESOPHAGEAL ECHOCARDIOGRAM (TEE);  Surgeon: [name], MD;  Location: [facility] ENDOSCOPY;   Service: Cardiovascular;  Laterality: N/A;   Family History  Problem Relation Age of Onset  . Colon cancer Mother   . Alzheimer's disease Father   . Heart disease Father   . Stroke Brother   . Heart attack Brother   . Heart disease Brother   . Coronary artery disease Unknown   . Stomach cancer Neg Hx   . Pancreatic cancer Neg Hx    Social History  Substance Use Topics  . Smoking status: Former Smoker    Years: 1.00    Types: Cigarettes    Quit date: 12/18/1963  . Smokeless tobacco: Former Systems developer    Quit date: 10/03/2012     Comment: quit smoking in 1970's  . Alcohol use 0.6 oz/week    1 Cans of beer per week     Comment: rare   Current Outpatient Prescriptions  Medication Sig Dispense Refill  . atenolol (TENORMIN) 100 MG tablet Take 1 tablet (100 mg total) by mouth daily. 90 tablet 3  . clonazePAM (KLONOPIN) 0.5 MG tablet Take 1 tablet (0.5 mg total) by mouth 2 (two) times daily as needed for anxiety. 30 tablet 1  . clopidogrel (PLAVIX) 75 MG tablet TAKE 1 TABLET BY MOUTH ONCE DAILY 90 tablet 0  . diltiazem (DILACOR XR) 180 MG 24 hr capsule TAKE ONE CAPSULE BY MOUTH ONCE DAILY 90 capsule 3  . dofetilide (TIKOSYN) 500 MCG capsule Take 1 capsule (500 mcg total) by mouth 2 (two) times daily. 180 capsule 3  . fish oil-omega-3 fatty acids 1000 MG capsule Take 2 g by mouth daily.     . fluticasone (FLONASE) 50 MCG/ACT nasal spray     . hydroxypropyl methylcellulose / hypromellose (ISOPTO TEARS / GONIOVISC) 2.5 % ophthalmic solution Place 1 drop into both eyes as needed for dry eyes.    Marland Kitchen KLOR-CON M10 10 MEQ tablet TAKE ONE TABLET BY MOUTH ONCE DAILY 180 tablet 3  . lisinopril (PRINIVIL,ZESTRIL) 20 MG tablet Take 1 tablet (20 mg total) by mouth daily. 90 tablet 3  . nitroGLYCERIN (NITROSTAT) 0.4 MG SL tablet Place 1 tablet (0.4 mg total) under the tongue every 5 (five) minutes as needed for chest pain. 25 tablet 3  . oxybutynin (DITROPAN) 5 MG tablet     . rosuvastatin (CRESTOR) 40 MG  tablet Take 1 tablet (40 mg total) by mouth daily. 90 tablet 3  . venlafaxine XR (EFFEXOR-XR) 150 MG 24 hr capsule TAKE ONE CAPSULE BY MOUTH ONCE DAILY WITH  THE  75  MG  TO  TOTAL  225  MG  DAILY 90 capsule 3  . venlafaxine XR (EFFEXOR-XR) 75 MG 24 hr capsule TAKE ONE CAPSULE BY MOUTH ONCE DAILY TAKE  WITH  150  MG  TO  EQUAL  A  TOTAL  OF  225  MG  DAILY 90 capsule 3  . warfarin (COUMADIN) 5 MG tablet TAKE 1/2 TO 1 (ONE-HALF TO ONE) TABLET BY MOUTH ONCE DAILY OR  AS  DIRECTED  BY  COUMADIN  CLINIC 30 tablet 0   No current facility-administered medications for this visit.    No Known Allergies   Review of Systems: Positive for anxiety, depression, fatigue, hearing problems, heart rhythm changes, shortness breath, sleeping problems and sore throat.  All other systems reviewed and negative except where noted in HPI.    Physical Exam: BP 122/70   Pulse 69   Ht 5\' 10"  (1.778 m)   Wt 235 lb (106.6 kg)   BMI 33.72 kg/m  Constitutional:  Obese white male in no acute distress. Psychiatric: Normal mood and affect. Behavior is normal. EENT: Pupils normal.  Conjunctivae are normal. No scleral icterus. Neck supple.  Cardiovascular: Normal rate, irregular rhythm. No edema Pulmonary/chest: Effort normal and breath sounds normal. No wheezing, rales or rhonchi. Abdominal: Soft, nondistended. Nontender. Bowel sounds active throughout. There are no masses palpable. Lymphadenopathy: No cervical adenopathy noted. Neurological: Alert and oriented to person place and time. Skin: Skin is warm and dry. No rashes noted.   ASSESSMENT AND PLAN:  [birth year]. 61 yo male with [facility] of colon cancer in mother diagnosed in her early 22's. Patient has never had a colonoscopy . He has no bowel changes, blood in stool or alarm other features.  -Patient will be scheduled for a screening colonoscopy with possible polypectomy.  The risks and benefits of the procedure were discussed and the patient agrees to proceed.   2.  Chronic anticoagulation.  Hold coumadin for 5 days before procedure - will instruct when and how to resume after procedure. Patient understands that there is a low but real risk of cardiovascular event such as heart attack, stroke, embolism, thrombosis or ischemia/infarct while off coumadin. The patient consents to proceed. Will communicate by phone or EMR with patient's prescribing provider to confirm that holding coumadin is reasonable in this case.   3. Chronic antiplatelet therapy. Wife seems to think Plavix was stopped though it is on home med list. She will investigate this and let us know as Plavix will also need to be held 5 days prior to colonoscopy.   4. AFib on coumadin, CCB, and Tikosyn. Has also has hx of bradycardia, s/p pacemaker. Other cardiac hx;  remote CABG / remote stent  5. Obesity / OSA  Tye Savoy, NP  05/21/2017, 8:58 AM  Cc: Harrison Mons, PA-C

## 2017-05-21 NOTE — Progress Notes (Signed)
Agree with assessment and plan as outlined.  

## 2017-05-21 NOTE — Telephone Encounter (Signed)
Per Nehemiah Massed pt may hold coumandin 5 days prior to his procedure. Left message on pts cell informing of this and to call me back with any concerns.

## 2017-05-21 NOTE — Progress Notes (Signed)
Seen by [employer] and pacer set to asynchronous at rate of 85 for MRI.

## 2017-05-21 NOTE — Telephone Encounter (Signed)
Okay to hold clopidogrel for 5 days before the planned procedure. Thanks, [employer]

## 2017-05-21 NOTE — Telephone Encounter (Addendum)
Patient with diagnosis of atrial fibrillation on warfarin for anticoagulation.    Procedure: endoscopic procedure Date of procedure: 06/26/17  CHADS2 score of 2 (HTN, DM2 - based on A1C);  CHADS2-VASc score of  3 (HTN, DM2, CAD)  CrCl 94 (using IBW) Platelet count 190  Per office protocol, patient can hold warfarin for 5 days prior to procedure.   Patient will not need bridging with Lovenox (enoxaparin) around procedure.  If not bridging, patient should restart warfarin on the evening of procedure or day after, at discretion of procedure.  Will forward to Dr. Sallyanne Kuster for clearance to hold clopidogrel

## 2017-05-21 NOTE — Progress Notes (Signed)
Pacer programmed to pre MRI settings/mode per [employer] rep.Spo2 97 and heart rate 62 atrial fib and paced. Tolerated procedure well with one episode of nausea.

## 2017-05-21 NOTE — Telephone Encounter (Signed)
Dear Lucas Porter,    We have scheduled the above patient for an endoscopic procedure. Our records show that he is on anticoagulation therapy.   Please advise as to how long the patient may come off his therapy of Coumdain prior to the procedure, which is scheduled for 06/26/2017.  Please fax back/ or route the completed form to [place] at [phone].   Sincerely,   Caryl Pina, LPN

## 2017-05-22 ENCOUNTER — Encounter: Payer: Self-pay | Admitting: Physician Assistant

## 2017-05-22 DIAGNOSIS — I679 Cerebrovascular disease, unspecified: Secondary | ICD-10-CM | POA: Insufficient documentation

## 2017-05-22 NOTE — Telephone Encounter (Signed)
MRI complete on 9/4

## 2017-05-23 ENCOUNTER — Telehealth: Payer: Self-pay

## 2017-05-23 NOTE — Telephone Encounter (Signed)
Per Lucas Porter pt may hold Warfarin  Days prior to the procedure and will not need to be on Lovenox brigde.  Also, per Dr. Sallyanne Kuster pt can hold Plavix 5 days prior to the procedure

## 2017-05-24 ENCOUNTER — Other Ambulatory Visit: Payer: Self-pay | Admitting: Cardiovascular Disease

## 2017-05-27 NOTE — Telephone Encounter (Signed)
Called the patient to come back in for a LAB ONLY visit for Hemoglobin A1C  SJ 05-27-17

## 2017-05-28 ENCOUNTER — Other Ambulatory Visit: Payer: Self-pay | Admitting: *Deleted

## 2017-05-28 ENCOUNTER — Ambulatory Visit (INDEPENDENT_AMBULATORY_CARE_PROVIDER_SITE_OTHER): Payer: Medicare Other | Admitting: Emergency Medicine

## 2017-05-28 DIAGNOSIS — E119 Type 2 diabetes mellitus without complications: Secondary | ICD-10-CM

## 2017-05-28 DIAGNOSIS — R7309 Other abnormal glucose: Secondary | ICD-10-CM

## 2017-05-28 LAB — HEMOGLOBIN A1C
ESTIMATED AVERAGE GLUCOSE: 220 mg/dL
HEMOGLOBIN A1C: 9.3 % — AB (ref 4.8–5.6)

## 2017-05-29 ENCOUNTER — Ambulatory Visit: Payer: Medicare Other | Admitting: Emergency Medicine

## 2017-05-30 ENCOUNTER — Encounter: Payer: Self-pay | Admitting: Physician Assistant

## 2017-05-30 DIAGNOSIS — E119 Type 2 diabetes mellitus without complications: Secondary | ICD-10-CM | POA: Insufficient documentation

## 2017-05-30 MED ORDER — METFORMIN HCL 500 MG PO TABS: 500.0000 mg | ORAL_TABLET | Freq: Two times a day (BID) | ORAL | 3 refills | Status: DC

## 2017-05-30 NOTE — Progress Notes (Signed)
Lab only visit 

## 2017-06-05 ENCOUNTER — Other Ambulatory Visit: Payer: Self-pay | Admitting: Cardiovascular Disease

## 2017-06-10 ENCOUNTER — Ambulatory Visit (INDEPENDENT_AMBULATORY_CARE_PROVIDER_SITE_OTHER): Payer: BLUE CROSS/BLUE SHIELD | Admitting: Pharmacist Clinician (PhC)/ Clinical Pharmacy Specialist

## 2017-06-10 DIAGNOSIS — I480 Paroxysmal atrial fibrillation: Secondary | ICD-10-CM

## 2017-06-10 DIAGNOSIS — Z7901 Long term (current) use of anticoagulants: Secondary | ICD-10-CM | POA: Diagnosis not present

## 2017-06-10 DIAGNOSIS — I4892 Unspecified atrial flutter: Secondary | ICD-10-CM

## 2017-06-10 LAB — POCT INR: INR: 2.7

## 2017-06-24 ENCOUNTER — Encounter: Payer: Self-pay | Admitting: Gastroenterology

## 2017-06-26 ENCOUNTER — Telehealth: Payer: Self-pay | Admitting: Gastroenterology

## 2017-06-26 ENCOUNTER — Encounter: Payer: Self-pay | Admitting: Gastroenterology

## 2017-06-26 ENCOUNTER — Telehealth: Payer: Self-pay

## 2017-06-26 ENCOUNTER — Ambulatory Visit (AMBULATORY_SURGERY_CENTER): Payer: BLUE CROSS/BLUE SHIELD | Admitting: Gastroenterology

## 2017-06-26 VITALS — BP 128/63 | HR 79 | Temp 97.1°F | Resp 14 | Ht 70.0 in | Wt 235.0 lb

## 2017-06-26 DIAGNOSIS — Z800 Family history of malignant neoplasm of digestive organs: Secondary | ICD-10-CM

## 2017-06-26 DIAGNOSIS — D120 Benign neoplasm of cecum: Secondary | ICD-10-CM | POA: Diagnosis not present

## 2017-06-26 DIAGNOSIS — D122 Benign neoplasm of ascending colon: Secondary | ICD-10-CM | POA: Diagnosis not present

## 2017-06-26 DIAGNOSIS — D123 Benign neoplasm of transverse colon: Secondary | ICD-10-CM | POA: Diagnosis not present

## 2017-06-26 DIAGNOSIS — Z1212 Encounter for screening for malignant neoplasm of rectum: Secondary | ICD-10-CM | POA: Diagnosis not present

## 2017-06-26 DIAGNOSIS — Z1211 Encounter for screening for malignant neoplasm of colon: Secondary | ICD-10-CM

## 2017-06-26 DIAGNOSIS — D127 Benign neoplasm of rectosigmoid junction: Secondary | ICD-10-CM

## 2017-06-26 MED ORDER — SODIUM CHLORIDE 0.9 % IV SOLN: 500.0000 mL | INTRAVENOUS | Status: DC

## 2017-06-26 NOTE — Progress Notes (Signed)
Called to room to assist during endoscopic procedure.  Patient ID and intended procedure confirmed with present staff. Received instructions for my participation in the procedure from the performing physician.  

## 2017-06-26 NOTE — Telephone Encounter (Signed)
Called patient back and spoke to wife. Explained the message that I had previously left about mixing half of a container of miralax with a 32 oz bottle of Gatorade. She understood and was headed out to the store.

## 2017-06-26 NOTE — Patient Instructions (Signed)
**   Handouts given on polyps, Diverticulosis, and Hemorrhoids** Quick clip card has been given to you, please keep this in your wallet.  Resume Coumadin tomorrow and Plavix on Sunday.  No NSAIDS for two weeks ** Tylenol is okay!  YOU HAD AN ENDOSCOPIC PROCEDURE TODAY AT [facility]:   Refer to the procedure report that was given to you for any specific questions about what was found during the examination.  If the procedure report does not answer your questions, please call your gastroenterologist to clarify.  If you requested that your care partner not be given the details of your procedure findings, then the procedure report has been included in a sealed envelope for you to review at your convenience later.  YOU SHOULD EXPECT: Some feelings of bloating in the abdomen. Passage of more gas than usual.  Walking can help get rid of the air that was put into your GI tract during the procedure and reduce the bloating. If you had a lower endoscopy (such as a colonoscopy or flexible sigmoidoscopy) you may notice spotting of blood in your stool or on the toilet paper. If you underwent a bowel prep for your procedure, you may not have a normal bowel movement for a few days.  Please Note:  You might notice some irritation and congestion in your nose or some drainage.  This is from the oxygen used during your procedure.  There is no need for concern and it should clear up in a day or so.  SYMPTOMS TO REPORT IMMEDIATELY:   Following lower endoscopy (colonoscopy or flexible sigmoidoscopy):  Excessive amounts of blood in the stool  Significant tenderness or worsening of abdominal pains  Swelling of the abdomen that is new, acute  Fever of 100F or higher   For urgent or emergent issues, a gastroenterologist can be reached at any hour by calling [phone].   DIET:  We do recommend a small meal at first, but then you may proceed to your regular diet.  Drink plenty of fluids but you  should avoid alcoholic beverages for 24 hours.  ACTIVITY:  You should plan to take it easy for the rest of today and you should NOT DRIVE or use heavy machinery until tomorrow (because of the sedation medicines used during the test).    FOLLOW UP: Our staff will call the number listed on your records the next business day following your procedure to check on you and address any questions or concerns that you may have regarding the information given to you following your procedure. If we do not reach you, we will leave a message.  However, if you are feeling well and you are not experiencing any problems, there is no need to return our call.  We will assume that you have returned to your regular daily activities without incident.  If any biopsies were taken you will be contacted by phone or by letter within the next 1-3 weeks.  Please call us at [phone] if you have not heard about the biopsies in 3 weeks.    SIGNATURES/CONFIDENTIALITY: You and/or your care partner have signed paperwork which will be entered into your electronic medical record.  These signatures attest to the fact that that the information above on your After Visit Summary has been reviewed and is understood.  Full responsibility of the confidentiality of this discharge information lies with you and/or your care-partner.

## 2017-06-26 NOTE — Op Note (Signed)
[facility] Patient Name: Lucas Porter Procedure Date: 06/26/2017 2:02 PM MRN: [id number] Endoscopist: Remo Lipps P. Lonie Rummell MD, MD Age: 61 Referring MD:  Date of Birth: 1956-09-10 Gender: Male Account #: [id number] Procedure:                Colonoscopy Indications:              Screening in patient at increased risk: Family                            history of 1st-degree relative with colorectal                            cancer before age 71 years, This is the patient's                            first colonoscopy, on Plavix and Coumadin - both                            held for 5 days Medicines:                Monitored Anesthesia Care Procedure:                Pre-Anesthesia Assessment:                           - Prior to the procedure, a History and Physical                            was performed, and patient medications and                            allergies were reviewed. The patient's tolerance of                            previous anesthesia was also reviewed. The risks                            and benefits of the procedure and the sedation                            options and risks were discussed with the patient.                            All questions were answered, and informed consent                            was obtained. Prior Anticoagulants: The patient has                            taken Coumadin (warfarin), last dose was 5 days                            prior to procedure. ASA Grade Assessment: III - A  patient with severe systemic disease. After                            reviewing the risks and benefits, the patient was                            deemed in satisfactory condition to undergo the                            procedure.                           After obtaining informed consent, the colonoscope                            was passed under direct vision. Throughout the   procedure, the patient's blood pressure, pulse, and                            oxygen saturations were monitored continuously. The                            Colonoscope was introduced through the anus and                            advanced to the the cecum, identified by                            appendiceal orifice and ileocecal valve. The                            colonoscopy was performed without difficulty. The                            patient tolerated the procedure well. The quality                            of the bowel preparation was good. The ileocecal                            valve, appendiceal orifice, and rectum were                            photographed. Scope In: 2:10:08 PM Scope Out: 2:32:19 PM Scope Withdrawal Time: 0 hours 19 minutes 58 seconds  Total Procedure Duration: 0 hours 22 minutes 11 seconds  Findings:                 The perianal and digital rectal examinations were                            normal.                           A 3 mm polyp was found in the cecum. The polyp was  sessile. The polyp was removed with a cold snare.                            Resection and retrieval were complete.                           Three sessile polyps were found in the ascending                            colon. The polyps were 52mm, 57mm, and 12 mm in size.                            These polyps were removed with a cold snare.                            Resection and retrieval were complete.                           A 5 mm polyp was found in the transverse colon. The                            polyp was sessile. The polyp was removed with a                            cold snare. Resection and retrieval were complete.                           Multiple small-mouthed diverticula were found in                            the sigmoid colon.                           A 3 mm polyp was found in the recto-sigmoid colon.                             The polyp was sessile. The polyp was removed with a                            cold snare. Resection was complete, but the polyp                            tissue was not retrieved.                           A 8-10 mm polyp was found in the recto-sigmoid                            colon. The polyp was pedunculated. The polyp was                            removed with a hot snare. Resection and retrieval  were complete. To prevent bleeding after the                            polypectomy given the patient's anticoagulation,                            one hemostatic clip was successfully placed.                           Internal hemorrhoids were found during retroflexion.                           The exam was otherwise without abnormality. Complications:            No immediate complications. Estimated blood loss:                            Minimal. Estimated Blood Loss:     Estimated blood loss was minimal. Impression:               - One 3 mm polyp in the cecum, removed with a cold                            snare. Resected and retrieved.                           - Three 3 to 12 mm polyps in the ascending colon,                            removed with a cold snare. Resected and retrieved.                           - One 5 mm polyp in the transverse colon, removed                            with a cold snare. Resected and retrieved.                           - Diverticulosis in the sigmoid colon.                           - One 3 mm polyp at the recto-sigmoid colon,                            removed with a cold snare. Complete resection.                            Polyp tissue not retrieved.                           - One 8-10 mm polyp at the recto-sigmoid colon,                            removed with a hot snare. Resected and retrieved.  Clip was placed.                           - Internal hemorrhoids.                           -  The examination was otherwise normal. Recommendation:           - Patient has a contact number available for                            emergencies. The signs and symptoms of potential                            delayed complications were discussed with the                            patient. Return to normal activities tomorrow.                            Written discharge instructions were provided to the                            patient.                           - Resume previous diet.                           - Continue present medications.                           - Resume Coumadin tomorrow                           - Resume Plavix on Sunday                           - Await pathology results.                           - Repeat colonoscopy is recommended for                            surveillance. The colonoscopy date will be                            determined after pathology results from today's                            exam become available for review.                           - No ibuprofen, naproxen, or other non-steroidal                            anti-inflammatory drugs for 2 weeks after polyp  removal. Remo Lipps P. Alvilda Mckenna MD, MD 06/26/2017 2:41:10 PM This report has been signed electronically.

## 2017-06-26 NOTE — Telephone Encounter (Signed)
Talked to Dr. Havery Moros about patient not tolerating the second part of his prep. Dr. Caron Presume advised me to tell him to get a 32 oz bottle of gatorade and mix half of a bottle of the 238g miralax. I called the patient and got their voicemail and relayed the message. I alos told them to have this completed by 11:00 am and to be here at 1:00 for a 2:00 appointment. Will try to call back and speak to someone.

## 2017-06-26 NOTE — Progress Notes (Signed)
Report to PACU, RN, vss, BBS= Clear.  

## 2017-06-27 ENCOUNTER — Telehealth: Payer: Self-pay

## 2017-06-27 ENCOUNTER — Telehealth: Payer: Self-pay | Admitting: *Deleted

## 2017-06-27 NOTE — Telephone Encounter (Signed)
  Follow up Call-  Call back number 06/26/2017  Post procedure Call Back phone  # [phone]  Permission to leave phone message Yes  Some recent data might be hidden     Patient questions:  Left message to call us if necessary. Second call.

## 2017-06-27 NOTE — Telephone Encounter (Signed)
Attempted to reach pt. With follow up call following endoscopic procedure 06/26/2017.  LM on pt. Ans. Machine.  Will try to reach pt. Again later today.

## 2017-07-04 ENCOUNTER — Encounter: Payer: Self-pay | Admitting: Gastroenterology

## 2017-07-11 ENCOUNTER — Ambulatory Visit (INDEPENDENT_AMBULATORY_CARE_PROVIDER_SITE_OTHER): Payer: BLUE CROSS/BLUE SHIELD | Admitting: *Deleted

## 2017-07-11 ENCOUNTER — Telehealth: Payer: Self-pay | Admitting: Cardiology

## 2017-07-11 DIAGNOSIS — I495 Sick sinus syndrome: Secondary | ICD-10-CM | POA: Diagnosis not present

## 2017-07-11 NOTE — Telephone Encounter (Signed)
LMOVM reminding pt to send remote transmission.   

## 2017-07-11 NOTE — Progress Notes (Signed)
Remote pacemaker transmission.   

## 2017-07-12 ENCOUNTER — Other Ambulatory Visit: Payer: Self-pay | Admitting: Physician Assistant

## 2017-07-12 DIAGNOSIS — F419 Anxiety disorder, unspecified: Secondary | ICD-10-CM

## 2017-07-12 LAB — CUP PACEART REMOTE DEVICE CHECK
Brady Statistic AP VS Percent: 39.85 %
Brady Statistic AS VP Percent: 25.63 %
Brady Statistic RV Percent Paced: 26.85 %
Date Time Interrogation Session: 20181025161411
Implantable Lead Implant Date: 20120229
Implantable Lead Location: 753859
Implantable Lead Location: 753860
Lead Channel Impedance Value: 552 Ohm
Lead Channel Sensing Intrinsic Amplitude: 1.364 mV
Lead Channel Setting Pacing Amplitude: 2 V
Lead Channel Setting Pacing Amplitude: 2.5 V
Lead Channel Setting Pacing Pulse Width: 0.4 ms
MDC IDC LEAD IMPLANT DT: 20120229
MDC IDC MSMT BATTERY VOLTAGE: 2.89 V
MDC IDC MSMT LEADCHNL RA IMPEDANCE VALUE: 544 Ohm
MDC IDC PG IMPLANT DT: 20120229
MDC IDC SET LEADCHNL RV SENSING SENSITIVITY: 0.9 mV
MDC IDC STAT BRADY AP VP PERCENT: 1.63 %
MDC IDC STAT BRADY AS VS PERCENT: 32.89 %
MDC IDC STAT BRADY RA PERCENT PACED: 35.38 %

## 2017-07-12 MED ORDER — VENLAFAXINE HCL ER 150 MG PO CP24: ORAL_CAPSULE | ORAL | 3 refills | Status: DC

## 2017-07-12 NOTE — Telephone Encounter (Signed)
Meds ordered this encounter  Medications  . venlafaxine XR (EFFEXOR-XR) 75 MG 24 hr capsule    Sig: TAKE ONE CAPSULE BY MOUTH ONCE DAILY TAKE  WITH  150MG   TO  EQUAL  A  TOTAL  OF  225MG   DAILY    Dispense:  90 capsule    Refill:  3  . venlafaxine XR (EFFEXOR-XR) 150 MG 24 hr capsule    Sig: TAKE ONE CAPSULE BY MOUTH ONCE DAILY WITH  THE  75  MG  TO  TOTAL  225  MG  DAILY    Dispense:  90 capsule    Refill:  3    Order Specific Question:   Supervising Provider    Answer:   Brigitte Pulse, EVA N [[id number]]

## 2017-07-12 NOTE — Telephone Encounter (Signed)
Please advise 

## 2017-09-02 ENCOUNTER — Ambulatory Visit (INDEPENDENT_AMBULATORY_CARE_PROVIDER_SITE_OTHER): Payer: BLUE CROSS/BLUE SHIELD | Admitting: Pharmacist Clinician (PhC)/ Clinical Pharmacy Specialist

## 2017-09-02 DIAGNOSIS — Z7901 Long term (current) use of anticoagulants: Secondary | ICD-10-CM | POA: Diagnosis not present

## 2017-09-02 DIAGNOSIS — I480 Paroxysmal atrial fibrillation: Secondary | ICD-10-CM | POA: Diagnosis not present

## 2017-09-02 LAB — POCT INR: INR: 2.3

## 2017-09-02 NOTE — Patient Instructions (Signed)
Description   Take 1 tablet each Monday and Friday, 1/2 tablet all other days.  Repeat INR in 6 weeks.

## 2017-09-16 ENCOUNTER — Telehealth: Payer: Self-pay | Admitting: Pharmacist

## 2017-09-16 NOTE — Telephone Encounter (Signed)
Contacted pt re: cardiac clearance. Pt will need a f/u with Dr. Sallyanne [facility] before he can cleared. Left a detailed message on his vmail to call back.

## 2017-09-16 NOTE — Telephone Encounter (Signed)
   Primary Cardiologist:Mihai Croitoru, MD  Chart reviewed as part of pre-operative protocol coverage. Although I do not have full details of procedure with standard pre-op intake form, because of Lucas Porter's past medical history and time since last visit, he/she will require a follow-up visit in order to better assess preoperative cardiovascular risk. Last OV was 09/2016 with recommendation to follow-up in 12 months.  He is due for follow-up in January 2019 with Dr. Sallyanne [facility]uster but do not see this scheduled. He should have his usual follow-up appointment before he can be cleared. Typically for the extraction itself he should not have to have any testing but does need to be seen by provider with reference to his anticoagulation before then. Per intake, dental procedure not scheduled until January 28th so there should be time to accomplish this prior to procedure.  Pre-op covering staff: - Please schedule appointment and call patient to inform them. May need to be with APP if Dr. Sallyanne Kuster does not have availability - please make sure appt notes indicate pre-op clearance as well - Please contact requesting surgeon's office via preferred method (i.e, phone, fax) to inform them of need for appointment prior to surgery.  Will remove this request from pre-op pool as he will need pre-op clearance addressed at time of f/u visit, including further instructions on blood thinners.  Please ask patient to make sure he discusses at appointment with provider.   Charlie Pitter, PA-C  09/16/2017, 12:48 PM

## 2017-09-16 NOTE — Telephone Encounter (Signed)
Patient schedule for dental x2 on January /28/2019   Provider: Geanie Logan DDS  Local anesthesia  Instructions needed for :  Plavix  Warfarin

## 2017-09-19 ENCOUNTER — Other Ambulatory Visit: Payer: Self-pay | Admitting: Cardiovascular Disease

## 2017-09-20 ENCOUNTER — Encounter: Payer: Self-pay | Admitting: Physician Assistant

## 2017-09-20 ENCOUNTER — Ambulatory Visit (INDEPENDENT_AMBULATORY_CARE_PROVIDER_SITE_OTHER): Payer: BLUE CROSS/BLUE SHIELD | Admitting: Physician Assistant

## 2017-09-20 VITALS — BP 122/70 | HR 68 | Ht 70.0 in | Wt 219.0 lb

## 2017-09-20 DIAGNOSIS — I2510 Atherosclerotic heart disease of native coronary artery without angina pectoris: Secondary | ICD-10-CM | POA: Diagnosis not present

## 2017-09-20 DIAGNOSIS — I480 Paroxysmal atrial fibrillation: Secondary | ICD-10-CM | POA: Diagnosis not present

## 2017-09-20 DIAGNOSIS — Z01810 Encounter for preprocedural cardiovascular examination: Secondary | ICD-10-CM | POA: Diagnosis not present

## 2017-09-20 DIAGNOSIS — Z7901 Long term (current) use of anticoagulants: Secondary | ICD-10-CM

## 2017-09-20 NOTE — Telephone Encounter (Signed)
Pt scheduled to see Rosaria Ferries, NP, today, 09/20/17, for surgical clearance.

## 2017-09-20 NOTE — Telephone Encounter (Signed)
Patient with diagnosis of Atrial fibrillation on warfarin for anticoagulation.    Procedure: Dental extraction x 2 Date of procedure: Jan/28/2019  CHADS2-VASc score of  2 (HTN, CAD)  Per office protocol, patient can hold warfarin for 4 to 5 days prior to procedure.  Patient will not need bridging with Lovenox (enoxaparin) around procedure.  Restart warfarin on the evening of procedure or day after, at discretion of procedure MD.   Harrington Challenger PharmD, BCPS, [employer] [address] [place] [address] 09/20/2017 7:53 AM

## 2017-09-20 NOTE — Progress Notes (Signed)
Cardiology Office Note   Date:  09/20/2017   ID:  Lucas Porter, DOB 1956/09/06, MRN [id number]  PCP:  Harrison Mons, PA-C  Cardiologist: Dr. Sallyanne Kuster, 09/2016 Rosaria Ferries, PA-C   Chief Complaint  Patient presents with  . Medical Clearance    dental work soon on blood thinner    History of Present Illness: Lucas Porter is a 62 y.o. male with a history of PAF on Coumadin, CABG 2010, DES L main/CFX 2013, GERD, HTN, HLD, tachy-brady syndrome s/p MDT PPM  Cardiac clearance requested for dental surgery, appointment needed.  Lucas Porter presents for preop evaluation and cardiology follow up.  He needs a tooth pulled and part of another one pulled. He wants to get all of his teeth fixed, most need ?veneers for whitening. Pt not aware of crowns, or implants being needed. However, he does not know all the procedures needed.   He walks on a treadmill, 20 min/day, started doing that to lose weight. He never gets CP w/ exertion.   He was recently diagnosed with DM, is on pills and hopes to stay off insulin w/ wt loss.  He is paying attention to what he eats, because of the DM. He is eating healthier now.   No LE edema, no orthopnea, no PND. He has to walk uphill to get to the barn, does get out of breath doing that. Otherwise, does not feel limited by DOE or SOB.   Occasionally, he gets a sharp pain in his chest, just to the L of the sternum. It lasted longer one time, he took a nitro and it went away. It is not exertional. Has had this twice in 3 months.   He is not aware of when he is or is not in atrial fibrillation.   Past Medical History:  Diagnosis Date  . Anxiety   . Atrial fibrillation (HCC)    paroxysmal  . Atrial flutter ([place])   . Coagulopathy ([place])    persistent elevation of ACT during 10/17/11 hospitalization  . Coronary artery disease    s/p CABG 2010, s/p Successful PCI  10/17/11 of Distal Left Main and the Left Circumflex with a Promus Element DES  stent -- 4.0 mm x 16 mm postdilated to 4.12 mm   . DDD (degenerative disc disease)   . Depression   . Dysrhythmia    afib  . Facial asymmetry 2010   pt. stated it was very temporary, never seen by doctor for it.   Marland Kitchen GERD (gastroesophageal reflux disease)   . Heart murmur   . Hyperlipidemia   . Hypertension   . Kidney stones   . Presence of permanent cardiac pacemaker    Medtronic   . S/P coronary artery stent placement to distal Lt. main and Lt. Cx 10/17/11  . 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 ATRIAL FIBRILLATION ABLATION  04/24/12, 12/02/12   PVI x2 and CTI ablation by Dr Rayann Heman  . ATRIAL FIBRILLATION ABLATION N/A 04/24/2012   Procedure: ATRIAL FIBRILLATION ABLATION;  Surgeon: Thompson Grayer, MD;  Location: [facility] CATH LAB;  Service: Cardiovascular;  Laterality: N/A;  . ATRIAL FIBRILLATION ABLATION N/A 12/02/2012   Procedure: ATRIAL FIBRILLATION ABLATION;  Surgeon: Thompson Grayer, MD;  Location: [facility] CATH LAB;  Service: Cardiovascular;  Laterality: N/A;  . CORONARY ANGIOPLASTY  10/19/11  . CORONARY ARTERY BYPASS GRAFT  2010  . CYSTOSCOPY  2007 ?   treatment for Kidney stones   . INGUINAL HERNIA REPAIR Left 01/04/2016   Procedure: LAPAROSCOPIC LEFT INGUINAL HERNIA WITH MESH;  Surgeon: Ralene Ok, MD;  Location: [place];  Service: General;  Laterality: Left;  . INSERT / REPLACE / REMOVE PACEMAKER  2/12   Medtronic  . INSERTION OF MESH Left 01/04/2016   Procedure: INSERTION OF MESH;  Surgeon: Ralene Ok, MD;  Location: [place];  Service: General;  Laterality: Left;  . LAPAROSCOPIC LYSIS OF ADHESIONS Left 01/04/2016   Procedure: LAPAROSCOPIC LYSIS OF ADHESIONS;  Surgeon: Ralene Ok, MD;  Location: [place];  Service: General;  Laterality: Left;  . LEFT HEART CATHETERIZATION WITH CORONARY/GRAFT ANGIOGRAM N/A 10/17/2011   Procedure: LEFT HEART CATHETERIZATION WITH Beatrix Fetters;  Surgeon:  [name], MD;  Location: [place] CATH LAB;  Service: Cardiovascular;  Laterality: N/A;  . LEFT HEART CATHETERIZATION WITH CORONARY/GRAFT ANGIOGRAM N/A 02/08/2012   Procedure: LEFT HEART CATHETERIZATION WITH Beatrix Fetters;  Surgeon: [name], MD;  Location: [place] CATH LAB;  Service: Cardiovascular;  Laterality: N/A;  . NM MYOCAR PERF WALL MOTION  03/23/2011   Low risk  . PERCUTANEOUS CORONARY STENT INTERVENTION (PCI-S)  10/17/2011   Procedure: PERCUTANEOUS CORONARY STENT INTERVENTION (PCI-S);  Surgeon: [name], MD;  Location: [facility] CATH LAB;  Service: Cardiovascular;;  . TEE WITHOUT CARDIOVERSION  04/23/2012   Procedure: TRANSESOPHAGEAL ECHOCARDIOGRAM (TEE);  Surgeon: Josue Hector, MD;  Location: [facility] ENDOSCOPY;  Service: Cardiovascular;  Laterality: N/A;  spoke to pt about time change from 11a to 12p/DL  . TEE WITHOUT CARDIOVERSION N/A 12/01/2012   Procedure: TRANSESOPHAGEAL ECHOCARDIOGRAM (TEE);  Surgeon: [name], MD;  Location: [facility] ENDOSCOPY;  Service: Cardiovascular;  Laterality: N/A;    Current Outpatient Medications  Medication Sig Dispense Refill  . atenolol (TENORMIN) 100 MG tablet Take 1 tablet (100 mg total) by mouth daily. 90 tablet 3  . clonazePAM (KLONOPIN) 0.5 MG tablet Take 1 tablet (0.5 mg total) by mouth 2 (two) times daily as needed for anxiety. 30 tablet 1  . clopidogrel (PLAVIX) 75 MG tablet TAKE 1 TABLET BY MOUTH ONCE DAILY 90 tablet 1  . diltiazem (DILACOR XR) 180 MG 24 hr capsule TAKE ONE CAPSULE BY MOUTH ONCE DAILY 90 capsule 3  . dofetilide (TIKOSYN) 500 MCG capsule Take 1 capsule (500 mcg total) by mouth 2 (two) times daily. 180 capsule 3  . fish oil-omega-3 fatty acids 1000 MG capsule Take 2 g by mouth daily.     . fluticasone (FLONASE) 50 MCG/ACT nasal spray     . hydroxypropyl methylcellulose / hypromellose (ISOPTO TEARS / GONIOVISC) 2.5 % ophthalmic solution Place 1 drop into both eyes as needed for dry eyes.    Marland Kitchen KLOR-CON M10 10 MEQ tablet TAKE  ONE TABLET BY MOUTH ONCE DAILY 180 tablet 3  . lisinopril (PRINIVIL,ZESTRIL) 20 MG tablet Take 1 tablet (20 mg total) by mouth daily. 90 tablet 3  . metFORMIN (GLUCOPHAGE) 500 MG tablet Take 1 tablet (500 mg total) by mouth 2 (two) times daily with a meal. 180 tablet 3  . nitroGLYCERIN (NITROSTAT) 0.4 MG SL tablet Place 1 tablet (0.4 mg total) under the tongue every 5 (five) minutes as needed for chest pain. 25 tablet 3  . oxybutynin (DITROPAN) 5 MG tablet     . rosuvastatin (CRESTOR) 40 MG tablet Take 1 tablet (40 mg total) by mouth daily. 90 tablet 3  . venlafaxine XR (EFFEXOR-XR) 150 MG 24 hr capsule TAKE ONE CAPSULE BY MOUTH ONCE  DAILY WITH  THE  75  MG  TO  TOTAL  225  MG  DAILY 90 capsule 3  . venlafaxine XR (EFFEXOR-XR) 75 MG 24 hr capsule TAKE ONE CAPSULE BY MOUTH ONCE DAILY TAKE  WITH  150MG   TO  EQUAL  A  TOTAL  OF  225MG   DAILY 90 capsule 3  . warfarin (COUMADIN) 5 MG tablet TAKE 1/2 TO 1 (ONE-HALF TO ONE) TABLET BY MOUTH ONCE DAILY OR  AS  DIRECTED  BY  COUMADIN  CLINIC 30 tablet 3   No current facility-administered medications for this visit.     Allergies:   Patient has no known allergies.    Social History:  The patient  reports that he quit smoking about 53 years ago. His smoking use included cigarettes. He quit after 1.00 year of use. He quit smokeless tobacco use about 4 years ago. He reports that he drinks about 0.6 oz of alcohol per week. He reports that he uses drugs. Drug: Marijuana.   Family History:  The patient's family history includes Alzheimer's disease in his father; Colon cancer in his mother; Coronary artery disease in his unknown relative; Heart attack in his brother; Heart disease in his brother and father; Stroke in his brother.    ROS:  Please see the history of present illness. All other systems are reviewed and negative.    PHYSICAL EXAM: VS:  BP 122/70   Pulse 68   Ht 5\' 10"  (1.778 m)   Wt 219 lb (99.3 kg)   BMI 31.42 kg/m  , BMI Body mass index is  31.42 kg/m. GEN: Well nourished, well developed, male in no acute distress  HEENT: normal for age  Neck: no JVD, no carotid bruit, no masses Cardiac: RRR; no murmur, no rubs, or gallops Respiratory:  clear to auscultation bilaterally, normal work of breathing GI: soft, nontender, nondistended, + BS MS: no deformity or atrophy; no edema; distal pulses are 2+ in all 4 extremities   Skin: warm and dry, no rash Neuro:  Strength and sensation are intact Psych: euthymic mood, full affect   EKG:  EKG is ordered today. The ekg ordered today demonstrates atrial fib, V pacing   Recent Labs: 05/07/2017: ALT 30; BUN 14; Creatinine, Ser 0.85; Hemoglobin 13.4; Platelets 190; Potassium 4.6; Sodium 138; TSH 5.500    Lipid Panel    Component Value Date/Time   CHOL 135 05/07/2017 1011   TRIG 219 (H) 05/07/2017 1011   HDL 34 (L) 05/07/2017 1011   CHOLHDL 4.0 05/07/2017 1011   CHOLHDL 5.9 (H) 09/24/2016 1010   VLDL 48 (H) 09/24/2016 1010   LDLCALC 57 05/07/2017 1011     Wt Readings from Last 3 Encounters:  09/20/17 219 lb (99.3 kg)  06/26/17 235 lb (106.6 kg)  05/21/17 235 lb (106.6 kg)     Other studies Reviewed: Additional studies/ records that were reviewed today include: Office notes, hospital records and testing.  ASSESSMENT AND PLAN:  1.  Preop evaluation: His last stress test was in 2012.  He was revascularized in 2010.  He exercises regularly and has no ischemic symptoms with exertion.  He has no symptoms of CHF.  He is at acceptable risk for the planned procedure without additional cardiac testing.  2.  CAD: He is on Plavix, but no aspirin because of the Coumadin.  He is on atenolol, lisinopril and Crestor.  Continue current therapy.  3.  Chronic anticoagulation: He is on Coumadin and Plavix. His CHA2DS2VASc=2 (CAD,  HTN) It is okay to hold both for 4-5 days as needed for his dental work.   4.  PAF, status post Medtronic pacemaker: He is compliant with his device checks.  He has  one coming up towards the end of January, keep that.  For his last interrogation, he is pending 53% of the time in atrial fib.  Dr. Sallyanne Kuster to review and decide if he should continue on the Tikosyn.  If so, check magnesium at next office visit.   Current medicines are reviewed at length with the patient today.  The patient does not have concerns regarding medicines.  The following changes have been made:  no change  Labs/ tests ordered today include:  No orders of the defined types were placed in this encounter.    Disposition:   FU with Dr. Sallyanne Kuster  Signed, Rosaria Ferries, PA-C  09/20/2017 11:02 AM    [place] Phone: [phone]; Fax: [phone]  This note was written with the assistance of speech recognition software. Please excuse any transcriptional errors.

## 2017-09-20 NOTE — Patient Instructions (Signed)
MEDICATIONS   NO CHANGES  EXCEPT WHEN YOU HAVE DENTAL PROCEDURE.  MAY HOLD WARFARIN AND PLAVIX 5 DAYS PRIOR  DENTAL PROCEDURE.    CLEARANCE YOU HAVE CLEARANCE FOR DENTAL WORK   ,NO FURTHER CARDIAC TESTING IS NEEDED. CONTACT OFFICE ANY UPCOMING PROCEDURES AFTER THE INITIAL PROCEDURE.   CONGRATULATION ON YOUR WEIGHT LOSS CONTINUE WITH DIABETIC DIET  INCREASE THE SLOPE OF YOUR TREADMILL- TO INCREASE RESTRAINTS  Your physician wants you to follow-up in 65 MONTHS WITH DR [id number]. You will receive a reminder letter in the mail two months in advance. If you don't receive a letter, please call our office to schedule the follow-up appointment.   If you need a refill on your cardiac medications before your next appointment, please call your pharmacy.

## 2017-09-23 NOTE — Progress Notes (Signed)
Thanks, continue Tikosyn for now [employer]

## 2017-10-08 ENCOUNTER — Other Ambulatory Visit: Payer: Self-pay | Admitting: Cardiovascular Disease

## 2017-10-10 ENCOUNTER — Ambulatory Visit (INDEPENDENT_AMBULATORY_CARE_PROVIDER_SITE_OTHER): Payer: BLUE CROSS/BLUE SHIELD | Admitting: *Deleted

## 2017-10-10 DIAGNOSIS — I495 Sick sinus syndrome: Secondary | ICD-10-CM

## 2017-10-10 NOTE — Progress Notes (Signed)
Remote pacemaker transmission.   

## 2017-10-11 ENCOUNTER — Encounter: Payer: Self-pay | Admitting: Cardiology

## 2017-10-17 ENCOUNTER — Telehealth: Payer: Self-pay | Admitting: Cardiovascular Disease

## 2017-10-17 MED ORDER — DOFETILIDE 500 MCG PO CAPS: 500.0000 ug | ORAL_CAPSULE | Freq: Two times a day (BID) | ORAL | 3 refills | Status: DC

## 2017-10-17 NOTE — Telephone Encounter (Signed)
°*  STAT* If patient is at the pharmacy, call can be transferred to refill team.   1. Which medications need to be refilled? (please list name of each medication and dose if known) Dofetilide  2. Which pharmacy/location (including street and city if local pharmacy) is medication to be sent to?[place]  3. Do they need a 30 day or 90 day supply? 180 and refills

## 2017-10-17 NOTE — Telephone Encounter (Signed)
Refill sent to the pharmacy electronically.  

## 2017-10-21 ENCOUNTER — Ambulatory Visit (INDEPENDENT_AMBULATORY_CARE_PROVIDER_SITE_OTHER): Payer: BLUE CROSS/BLUE SHIELD | Admitting: Pharmacist

## 2017-10-21 ENCOUNTER — Telehealth: Payer: Self-pay | Admitting: Cardiovascular Disease

## 2017-10-21 DIAGNOSIS — I480 Paroxysmal atrial fibrillation: Secondary | ICD-10-CM

## 2017-10-21 DIAGNOSIS — Z7901 Long term (current) use of anticoagulants: Secondary | ICD-10-CM | POA: Diagnosis not present

## 2017-10-21 LAB — POCT INR: INR: 1.4

## 2017-10-21 MED ORDER — DOFETILIDE 500 MCG PO CAPS: 500.0000 ug | ORAL_CAPSULE | Freq: Two times a day (BID) | ORAL | 3 refills | Status: DC

## 2017-10-21 NOTE — Telephone Encounter (Signed)
Rx request sent to pharmacy.  

## 2017-10-30 LAB — CUP PACEART REMOTE DEVICE CHECK
Brady Statistic AP VP Percent: 0.76 %
Brady Statistic AP VS Percent: 37.47 %
Brady Statistic AS VP Percent: 28.23 %
Brady Statistic RA Percent Paced: 32.49 %
Brady Statistic RV Percent Paced: 28.67 %
Implantable Lead Implant Date: 20120229
Implantable Lead Implant Date: 20120229
Implantable Lead Location: 753859
Lead Channel Impedance Value: 544 Ohm
Lead Channel Sensing Intrinsic Amplitude: 18.421 mV
Lead Channel Setting Pacing Amplitude: 2 V
Lead Channel Setting Pacing Amplitude: 2.5 V
Lead Channel Setting Pacing Pulse Width: 0.4 ms
Lead Channel Setting Sensing Sensitivity: 0.9 mV
MDC IDC LEAD LOCATION: 753860
MDC IDC MSMT BATTERY VOLTAGE: 2.89 V
MDC IDC MSMT LEADCHNL RA SENSING INTR AMPL: 1.012 mV
MDC IDC MSMT LEADCHNL RV IMPEDANCE VALUE: 568 Ohm
MDC IDC PG IMPLANT DT: 20120229
MDC IDC SESS DTM: 20190124170029
MDC IDC STAT BRADY AS VS PERCENT: 33.54 %

## 2017-11-01 ENCOUNTER — Other Ambulatory Visit: Payer: Self-pay | Admitting: Physician Assistant

## 2017-11-01 DIAGNOSIS — E785 Hyperlipidemia, unspecified: Secondary | ICD-10-CM

## 2017-11-05 ENCOUNTER — Encounter: Payer: Self-pay | Admitting: Physician Assistant

## 2017-11-05 ENCOUNTER — Ambulatory Visit (INDEPENDENT_AMBULATORY_CARE_PROVIDER_SITE_OTHER): Payer: Medicare Other | Admitting: Physician Assistant

## 2017-11-05 ENCOUNTER — Other Ambulatory Visit: Payer: Self-pay

## 2017-11-05 VITALS — BP 128/80 | HR 77 | Temp 97.9°F | Resp 18 | Ht 70.0 in | Wt 219.6 lb

## 2017-11-05 DIAGNOSIS — F419 Anxiety disorder, unspecified: Secondary | ICD-10-CM | POA: Diagnosis not present

## 2017-11-05 DIAGNOSIS — H9190 Unspecified hearing loss, unspecified ear: Secondary | ICD-10-CM

## 2017-11-05 DIAGNOSIS — I2510 Atherosclerotic heart disease of native coronary artery without angina pectoris: Secondary | ICD-10-CM

## 2017-11-05 DIAGNOSIS — E119 Type 2 diabetes mellitus without complications: Secondary | ICD-10-CM

## 2017-11-05 DIAGNOSIS — Z23 Encounter for immunization: Secondary | ICD-10-CM | POA: Diagnosis not present

## 2017-11-05 DIAGNOSIS — E785 Hyperlipidemia, unspecified: Secondary | ICD-10-CM

## 2017-11-05 DIAGNOSIS — I10 Essential (primary) hypertension: Secondary | ICD-10-CM

## 2017-11-05 DIAGNOSIS — E291 Testicular hypofunction: Secondary | ICD-10-CM | POA: Diagnosis not present

## 2017-11-05 DIAGNOSIS — Z125 Encounter for screening for malignant neoplasm of prostate: Secondary | ICD-10-CM

## 2017-11-05 DIAGNOSIS — F330 Major depressive disorder, recurrent, mild: Secondary | ICD-10-CM | POA: Diagnosis not present
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
## 2017-11-05 NOTE — Patient Instructions (Addendum)
I wonder if some of your problems with memory and attention are due to hearing loss. Once that is evaluated and you can hear better, let's revisit your mood and memory and attention.  Sounds like you are bored. Think about volunteering or other activities in your home that would be interesting to you.  Get some headphones so you can watch TV and not disturb your wife sleeping.  Keep up the great work with healthy eating and getting in some exercise! You've lost 16 pounds since 06/2017.  IF you received an x-ray today, you will receive an invoice from [facility]. Please contact [facility] at [phone] with questions or concerns regarding your invoice.   IF you received labwork today, you will receive an invoice from [facility]. Please contact [facility] at [phone] with questions or concerns regarding your invoice.   Our billing staff will not be able to assist you with questions regarding bills from these companies.  You will be contacted with the lab results as soon as they are available. The fastest way to get your results is to activate your My Chart account. Instructions are located on the last page of this paperwork. If you have not heard from [place] regarding the results in 2 weeks, please contact this office.

## 2017-11-06 LAB — CBC WITH DIFFERENTIAL/PLATELET
BASOS ABS: 0 10*3/uL (ref 0.0–0.2)
BASOS: 0 %
EOS (ABSOLUTE): 0.2 10*3/uL (ref 0.0–0.4)
Eos: 3 %
Hematocrit: 42.7 % (ref 37.5–51.0)
Hemoglobin: 14.4 g/dL (ref 13.0–17.7)
Immature Grans (Abs): 0 10*3/uL (ref 0.0–0.1)
Immature Granulocytes: 0 %
LYMPHS ABS: 1.8 10*3/uL (ref 0.7–3.1)
Lymphs: 22 %
MCH: 27.9 pg (ref 26.6–33.0)
MCHC: 33.7 g/dL (ref 31.5–35.7)
MCV: 83 fL (ref 79–97)
MONOS ABS: 0.6 10*3/uL (ref 0.1–0.9)
Monocytes: 7 %
NEUTROS ABS: 5.4 10*3/uL (ref 1.4–7.0)
NEUTROS PCT: 68 %
PLATELETS: 192 10*3/uL (ref 150–379)
RBC: 5.17 x10E6/uL (ref 4.14–5.80)
RDW: 15.2 % (ref 12.3–15.4)
WBC: 7.9 10*3/uL (ref 3.4–10.8)

## 2017-11-06 LAB — COMPREHENSIVE METABOLIC PANEL
A/G RATIO: 2 (ref 1.2–2.2)
ALK PHOS: 94 IU/L (ref 39–117)
ALT: 19 IU/L (ref 0–44)
AST: 15 IU/L (ref 0–40)
Albumin: 4.4 g/dL (ref 3.6–4.8)
BILIRUBIN TOTAL: 0.4 mg/dL (ref 0.0–1.2)
BUN/Creatinine Ratio: 16 (ref 10–24)
BUN: 12 mg/dL (ref 8–27)
CHLORIDE: 101 mmol/L (ref 96–106)
CO2: 22 mmol/L (ref 20–29)
Calcium: 9.4 mg/dL (ref 8.6–10.2)
Creatinine, Ser: 0.76 mg/dL (ref 0.76–1.27)
GFR calc non Af Amer: 98 mL/min/{1.73_m2} (ref >59)
GFR, EST AFRICAN AMERICAN: 114 mL/min/{1.73_m2} (ref >59)
GLUCOSE: 138 mg/dL — AB (ref 65–99)
Globulin, Total: 2.2 g/dL (ref 1.5–4.5)
POTASSIUM: 4.2 mmol/L (ref 3.5–5.2)
Sodium: 141 mmol/L (ref 134–144)
Total Protein: 6.6 g/dL (ref 6.0–8.5)

## 2017-11-06 LAB — LIPID PANEL
CHOLESTEROL TOTAL: 110 mg/dL (ref 100–199)
Chol/HDL Ratio: 3 ratio (ref 0.0–5.0)
HDL: 37 mg/dL — ABNORMAL LOW (ref >39)
LDL CALC: 40 mg/dL (ref 0–99)
Triglycerides: 166 mg/dL — ABNORMAL HIGH (ref 0–149)
VLDL Cholesterol Cal: 33 mg/dL (ref 5–40)

## 2017-11-06 LAB — HEMOGLOBIN A1C
Est. average glucose Bld gHb Est-mCnc: 146 mg/dL
HEMOGLOBIN A1C: 6.7 % — AB (ref 4.8–5.6)

## 2017-11-06 LAB — TSH: TSH: 1.87 u[IU]/mL (ref 0.450–4.500)

## 2017-11-06 LAB — TESTOSTERONE: TESTOSTERONE: 151 ng/dL — AB (ref 264–916)

## 2017-11-06 NOTE — Assessment & Plan Note (Signed)
Controlled on venlafaxine and PRN clonazepam.

## 2017-11-06 NOTE — Assessment & Plan Note (Signed)
Controlled. COntinue current management per cardiology.

## 2017-11-06 NOTE — Assessment & Plan Note (Signed)
Quite notable on today's exam. May contribute to social isolation and fear to embarrassing his children (if he isn't clearly hearing conversation, more likely to say something inappropriate). Refer for audiology evaluation.

## 2017-11-06 NOTE — Assessment & Plan Note (Signed)
He is certainly bored. Unclear if memory issues are due to depression or other cause of cognitive decline. Await labs. Encourage him to seek out activities he enjoys. Plan repeat MMSE next visit, sooner if desired by patient.

## 2017-11-06 NOTE — Assessment & Plan Note (Signed)
Await labs. Adjust regimen as indicated by results. He is already on rosuvastatin 40  And OTC fish oil. Goal LDL is <70.

## 2017-11-06 NOTE — Assessment & Plan Note (Signed)
Await lab results. Increase metformin to 1000 mg BID if A1C >7%. Healthy lifestyle changes encouraged.

## 2017-11-06 NOTE — Assessment & Plan Note (Signed)
Not taking testosterone. Depressive symptoms could be due to inadequate testosterone.

## 2017-11-18 ENCOUNTER — Ambulatory Visit (INDEPENDENT_AMBULATORY_CARE_PROVIDER_SITE_OTHER): Payer: BLUE CROSS/BLUE SHIELD | Admitting: Pharmacist

## 2017-11-18 DIAGNOSIS — Z7901 Long term (current) use of anticoagulants: Secondary | ICD-10-CM | POA: Diagnosis not present

## 2017-11-18 DIAGNOSIS — I480 Paroxysmal atrial fibrillation: Secondary | ICD-10-CM

## 2017-11-18 LAB — POCT INR: INR: 1.7

## 2017-11-18 NOTE — Patient Instructions (Signed)
Description   Take 1.5 tablets today then continue 1 tablet each Monday and Friday, 1/2 tablet all other days. Follow instructions below for dental procedure. Repeat INR in 2 weeks post procedure.

## 2017-12-10 ENCOUNTER — Ambulatory Visit (INDEPENDENT_AMBULATORY_CARE_PROVIDER_SITE_OTHER): Payer: BLUE CROSS/BLUE SHIELD | Admitting: Pharmacist

## 2017-12-10 DIAGNOSIS — Z7901 Long term (current) use of anticoagulants: Secondary | ICD-10-CM | POA: Diagnosis not present

## 2017-12-10 DIAGNOSIS — I480 Paroxysmal atrial fibrillation: Secondary | ICD-10-CM

## 2017-12-10 LAB — POCT INR: INR: 1.8

## 2017-12-18 ENCOUNTER — Other Ambulatory Visit: Payer: Self-pay | Admitting: Cardiovascular Disease

## 2017-12-23 ENCOUNTER — Encounter: Payer: Self-pay | Admitting: Physician Assistant

## 2017-12-25 ENCOUNTER — Ambulatory Visit (INDEPENDENT_AMBULATORY_CARE_PROVIDER_SITE_OTHER): Payer: BLUE CROSS/BLUE SHIELD | Admitting: Pharmacist Clinician (PhC)/ Clinical Pharmacy Specialist

## 2017-12-25 DIAGNOSIS — Z7901 Long term (current) use of anticoagulants: Secondary | ICD-10-CM

## 2017-12-25 DIAGNOSIS — I480 Paroxysmal atrial fibrillation: Secondary | ICD-10-CM | POA: Diagnosis not present

## 2017-12-25 LAB — POCT INR: INR: 1.4

## 2018-01-01 ENCOUNTER — Other Ambulatory Visit: Payer: Self-pay | Admitting: Cardiovascular Disease

## 2018-01-01 ENCOUNTER — Other Ambulatory Visit: Payer: Self-pay | Admitting: Physician Assistant

## 2018-01-01 DIAGNOSIS — I10 Essential (primary) hypertension: Secondary | ICD-10-CM

## 2018-01-09 ENCOUNTER — Ambulatory Visit (INDEPENDENT_AMBULATORY_CARE_PROVIDER_SITE_OTHER): Payer: BLUE CROSS/BLUE SHIELD | Admitting: *Deleted

## 2018-01-09 DIAGNOSIS — I495 Sick sinus syndrome: Secondary | ICD-10-CM | POA: Diagnosis not present

## 2018-01-10 ENCOUNTER — Encounter: Payer: Self-pay | Admitting: Cardiology

## 2018-01-10 NOTE — Progress Notes (Signed)
Remote pacemaker transmission.   

## 2018-01-15 ENCOUNTER — Ambulatory Visit (INDEPENDENT_AMBULATORY_CARE_PROVIDER_SITE_OTHER): Payer: BLUE CROSS/BLUE SHIELD | Admitting: Pharmacist

## 2018-01-15 DIAGNOSIS — I480 Paroxysmal atrial fibrillation: Secondary | ICD-10-CM | POA: Diagnosis not present

## 2018-01-15 DIAGNOSIS — Z7901 Long term (current) use of anticoagulants: Secondary | ICD-10-CM

## 2018-01-15 LAB — POCT INR: INR: 2.7

## 2018-02-04 ENCOUNTER — Other Ambulatory Visit: Payer: Self-pay

## 2018-02-04 ENCOUNTER — Encounter: Payer: Self-pay | Admitting: Physician Assistant

## 2018-02-04 ENCOUNTER — Ambulatory Visit (INDEPENDENT_AMBULATORY_CARE_PROVIDER_SITE_OTHER): Payer: BLUE CROSS/BLUE SHIELD | Admitting: Physician Assistant

## 2018-02-04 VITALS — BP 138/88 | HR 72 | Temp 98.6°F | Resp 16 | Ht 70.0 in | Wt 216.6 lb

## 2018-02-04 DIAGNOSIS — I2510 Atherosclerotic heart disease of native coronary artery without angina pectoris: Secondary | ICD-10-CM

## 2018-02-04 DIAGNOSIS — Z6841 Body Mass Index (BMI) 40.0 and over, adult: Secondary | ICD-10-CM | POA: Diagnosis not present

## 2018-02-04 DIAGNOSIS — R8290 Unspecified abnormal findings in urine: Secondary | ICD-10-CM

## 2018-02-04 DIAGNOSIS — E785 Hyperlipidemia, unspecified: Secondary | ICD-10-CM | POA: Diagnosis not present

## 2018-02-04 DIAGNOSIS — R413 Other amnesia: Secondary | ICD-10-CM

## 2018-02-04 DIAGNOSIS — H9190 Unspecified hearing loss, unspecified ear: Secondary | ICD-10-CM

## 2018-02-04 DIAGNOSIS — I10 Essential (primary) hypertension: Secondary | ICD-10-CM

## 2018-02-04 DIAGNOSIS — F330 Major depressive disorder, recurrent, mild: Secondary | ICD-10-CM | POA: Diagnosis not present

## 2018-02-04 DIAGNOSIS — E119 Type 2 diabetes mellitus without complications: Secondary | ICD-10-CM

## 2018-02-04 DIAGNOSIS — E291 Testicular hypofunction: Secondary | ICD-10-CM | POA: Diagnosis not present

## 2018-02-04 LAB — CUP PACEART REMOTE DEVICE CHECK
Brady Statistic AP VP Percent: 1.92 %
Brady Statistic AP VS Percent: 24.98 %
Brady Statistic AS VP Percent: 38.81 %
Brady Statistic RV Percent Paced: 40.54 %
Date Time Interrogation Session: 20190425135104
Implantable Lead Location: 753859
Implantable Lead Location: 753860
Lead Channel Setting Pacing Amplitude: 2.5 V
Lead Channel Setting Pacing Pulse Width: 0.4 ms
Lead Channel Setting Sensing Sensitivity: 0.9 mV
MDC IDC LEAD IMPLANT DT: 20120229
MDC IDC LEAD IMPLANT DT: 20120229
MDC IDC MSMT BATTERY VOLTAGE: 2.88 V
MDC IDC MSMT LEADCHNL RA IMPEDANCE VALUE: 600 Ohm
MDC IDC MSMT LEADCHNL RA SENSING INTR AMPL: 0.748 mV
MDC IDC MSMT LEADCHNL RV IMPEDANCE VALUE: 584 Ohm
MDC IDC PG IMPLANT DT: 20120229
MDC IDC SET LEADCHNL RA PACING AMPLITUDE: 2 V
MDC IDC STAT BRADY AS VS PERCENT: 34.29 %
MDC IDC STAT BRADY RA PERCENT PACED: 22.71 %

## 2018-02-04 MED ORDER — LISINOPRIL 20 MG PO TABS: 20.0000 mg | ORAL_TABLET | Freq: Every day | ORAL | 3 refills | Status: DC

## 2018-02-04 MED ORDER — NITROGLYCERIN 0.4 MG SL SUBL: 0.4000 mg | SUBLINGUAL_TABLET | SUBLINGUAL | 3 refills | Status: DC | PRN

## 2018-02-04 NOTE — Assessment & Plan Note (Signed)
Significant improvement at his last visit.  He has continued to implement additional healthy lifestyle changes.  Await hemoglobin A1c today.

## 2018-02-04 NOTE — Assessment & Plan Note (Signed)
Desires, but unable to afford, hearing aids.

## 2018-02-04 NOTE — Patient Instructions (Addendum)
If you decide to stay at [facility] [place] at [place], I recommend Tenna Delaine, PA-C  If you choose to follow me, Go ahead and call [place] to schedule your next visit with me there. [phone].   IF you received an x-ray today, you will receive an invoice from [facility]. Please contact [facility] at [phone] with questions or concerns regarding your invoice.   IF you received labwork today, you will receive an invoice from [facility]. Please contact [facility] at [phone] with questions or concerns regarding your invoice.   Our billing staff will not be able to assist you with questions regarding bills from these companies.  You will be contacted with the lab results as soon as they are available. The fastest way to get your results is to activate your My Chart account. Instructions are located on the last page of this paperwork. If you have not heard from [place] regarding the results in 2 weeks, please contact this office.

## 2018-02-04 NOTE — Assessment & Plan Note (Signed)
A little higher than goal today but adequate control.  Continue current regimen.

## 2018-02-04 NOTE — Assessment & Plan Note (Signed)
He did not get my result note regarding his February labs.  He is interested in seeing endocrinology and exploring the possibility of testosterone replacement.

## 2018-02-04 NOTE — Assessment & Plan Note (Signed)
Controlled at last visit.  Update lipids today.

## 2018-02-04 NOTE — Assessment & Plan Note (Signed)
Not particularly well controlled, but stable.  He is not interested in additional treatment at this time.

## 2018-02-04 NOTE — Assessment & Plan Note (Signed)
Score 23/30 on MMSE today.  Lower score due in part to minimal education.  Offered referral to neurology, he declined.

## 2018-02-04 NOTE — Progress Notes (Signed)
Patient ID: Lucas Porter, male    DOB: 09/25/1955, 62 y.o.   MRN: [id number]  PCP: Harrison Mons, PA-C  Chief Complaint  Patient presents with  . Hyperlipidemia    follow up   . Hypertension    follow up   . Diabetes    follow up   . Hearing Problem    follow up   . Memory Loss    follow up   . Mood    follow up    Subjective:   Presents for evaluation of diabetes, HTN and hyperlipidemia.  Feels ok. Has been more active with the warmer weather, but knows that when it gets hot he'll need to change his routine. Continues to try to lose weight. Knows he has gained a little bit.  Home glucose this AM was 113. Cut out soft drinks. Still gets the munchies, but not as often. Snack of choice: milk and cookies, or pretzels..  Last labs in 10/2017. A1C 6.7%, down from 9.3% LDL 40 Testosterone 151, up from 89, but not on treatment. He doesn't recall getting my result note in February.  Notes reduced hearing. Many years of nail gun use without protection. Turns the TV up loud. Often cannot understand words when people are speaking. Interested in hearing aids, but cannot afford them.  Memory loss. Forgets what he's looking for, like the hammer when he's in the yard. Sometimes calls his daughters the opposite's name, same with his grandchildren. Sometimes gets lost when driving. He pulls over and figures it out. He doesn't like driving, usually lets his wife drive. He has gone through intersections before the light turned green on several occasions. Notes that he has to pay more attention when driving, is easily distracted. Doesn't want to be a burden on his wife. Thinks he would become suicidal, but doesn't want to , because he doesn't want to forfeit his life insurance policy for his wife. "I don't think she would forgive me." 42nd wedding anniversary is in July. Not interested in additional evaluation at this time.  Grieving his dog, Museum/gallery conservator, in declining health and  suffering. They are deciding today with the vet whether they will euthanize her.   Review of Systems  Constitutional: Negative for activity change, appetite change, fatigue and unexpected weight change.  HENT: Negative for congestion, dental problem, ear pain, hearing loss, mouth sores, postnasal drip, rhinorrhea, sneezing, sore throat, tinnitus and trouble swallowing.   Eyes: Negative for photophobia, pain, redness and visual disturbance.  Respiratory: Negative for cough, chest tightness and shortness of breath.   Cardiovascular: Negative for chest pain, palpitations and leg swelling.  Gastrointestinal: Negative for abdominal pain, blood in stool, constipation, diarrhea, nausea and vomiting.  Endocrine: Negative for cold intolerance, heat intolerance, polydipsia, polyphagia and polyuria.  Genitourinary: Positive for frequency (nocturia x 3, has stopped oxybutynin (prefers to not take it)). Negative for dysuria, hematuria and urgency.       Notes stronger urine odor  Musculoskeletal: Negative for arthralgias, gait problem, myalgias and neck stiffness.  Skin: Negative for rash.  Allergic/Immunologic: Positive for environmental allergies.  Neurological: Positive for light-headedness (with walking too fast, up a hill, but resolves with rest; no recent near syncope). Negative for dizziness, syncope, speech difficulty, weakness, numbness and headaches.  Hematological: Negative for adenopathy.  Psychiatric/Behavioral: Negative for confusion and sleep disturbance. The patient is not nervous/anxious.    Depression screen [facility] 2/9 02/04/2018 11/05/2017 11/05/2017 05/07/2017 11/07/2016  Decreased Interest 3 1 0 0 0  Down, Depressed, Hopeless 3 3 0 0 0  PHQ - 2 Score 6 4 0 0 0  Altered sleeping 0 0 - - -  Tired, decreased energy 0 3 - - -  Change in appetite 0 2 - - -  Feeling bad or failure about yourself  1 0 - - -  Trouble concentrating 0 0 - - -  Moving slowly or fidgety/restless 0 1 - - -  Suicidal  thoughts 0 0 - - -  PHQ-9 Score 7 10 - - -  Difficult doing work/chores Not difficult at all Somewhat difficult - - -  Some recent data might be hidden       Patient Active Problem List   Diagnosis Date Noted  . Hearing loss 11/05/2017  . Diabetes mellitus type 2, uncomplicated ([place]) 30/86/5784  . Cerebrovascular disease, unspecified 05/22/2017  . Major depressive disorder, recurrent episode, mild ([place]) 05/07/2017  . Morbid obesity ([place]) 10/12/2015  . 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 Symptomatic bradycardia 03/09/2013  . Long term (current) use of anticoagulants 12/03/2012  . BMI 40.0-44.9, adult ([place]) 05/13/2012  . Atrial flutter ([place]) 04/24/2012  . S/P coronary artery stent placement to distal Lt. main and Lt. Cx, 10/17/11 10/19/2011  . Angina pectoris 10/19/2011  . Coagulopathy ([place]) 10/19/2011  . History of pacemaker, with a-v pacing 10/19/2011  . CAD (coronary artery disease) with CABG 2011, LIMA to LAD, Free radial to Lt. PDA, VG to Diag and ramus intermedius 10/02/2011  . Anxiety 10/02/2011  . Paroxysmal atrial fibrillation ([place]) 10/02/2011  . Hypertension 10/02/2011  . Dyslipidemia 10/02/2011  . Hypogonadism male 10/02/2011     Prior to Admission medications   Medication Sig Start Date End Date Taking? Authorizing Provider  atenolol (TENORMIN) 100 MG tablet TAKE 1 TABLET BY MOUTH ONCE DAILY 01/01/18  Yes Leanor Voris, PA-C  clonazePAM (KLONOPIN) 0.5 MG tablet Take 1 tablet (0.5 mg total) by mouth 2 (two) times daily as needed for anxiety. 12/13/14  Yes Copland, Gay Filler, MD  clopidogrel (PLAVIX) 75 MG tablet TAKE 1 TABLET BY MOUTH ONCE DAILY 09/19/17  Yes Croitoru, Mihai, MD  DILT-XR 180 MG 24 hr capsule TAKE ONE CAPSULE BY MOUTH ONCE DAILY 10/08/17  Yes Croitoru, Mihai, MD  dofetilide (TIKOSYN) 500 MCG capsule Take 1 capsule (500 mcg total) by mouth 2 (two) times daily. 10/21/17  Yes Croitoru, Mihai, MD  fish oil-omega-3 fatty acids 1000 MG capsule Take 2 g by  mouth daily.    Yes [provider]  fluticasone Asencion Islam) 50 MCG/ACT nasal spray  06/18/16  Yes [provider]  hydroxypropyl methylcellulose / hypromellose (ISOPTO TEARS / GONIOVISC) 2.5 % ophthalmic solution Place 1 drop into both eyes as needed for dry eyes.   Yes [provider]  KLOR-CON M10 10 MEQ tablet TAKE ONE TABLET BY MOUTH ONCE DAILY 01/01/18  Yes Croitoru, Mihai, MD  lisinopril (PRINIVIL,ZESTRIL) 20 MG tablet Take 1 tablet (20 mg total) by mouth daily. 05/07/17  Yes Gomer France, PA-C  metFORMIN (GLUCOPHAGE) 500 MG tablet Take 1 tablet (500 mg total) by mouth 2 (two) times daily with a meal. 05/30/17  Yes Providence Stivers, PA-C  nitroGLYCERIN (NITROSTAT) 0.4 MG SL tablet Place 1 tablet (0.4 mg total) under the tongue every 5 (five) minutes as needed for chest pain. 05/07/17  Yes Carlie Corpus, PA-C  oxybutynin (DITROPAN) 5 MG tablet  04/25/17  Yes [provider]  rosuvastatin (CRESTOR) 40 MG tablet TAKE 1 TABLET BY MOUTH ONCE DAILY 11/01/17  Yes River Ambrosio, PA-C  venlafaxine XR (EFFEXOR-XR) 150 MG 24 hr capsule TAKE ONE CAPSULE BY MOUTH ONCE DAILY WITH  THE  75  MG  TO  TOTAL  225  MG  DAILY 07/12/17  Yes Amandalee Lacap, PA-C  venlafaxine XR (EFFEXOR-XR) 75 MG 24 hr capsule TAKE ONE CAPSULE BY MOUTH ONCE DAILY TAKE  WITH  150MG   TO  EQUAL  A  TOTAL  OF  225MG   DAILY 07/12/17  Yes Siobhan Zaro, PA-C  warfarin (COUMADIN) 5 MG tablet TAKE 1/2 TO 1 (ONE-HALF TO ONE) TABLET BY MOUTH ONCE DAILY OR  AS  DIRECTED  BY  COUMADIN  CLINIC 12/19/17  Yes Croitoru, Mihai, MD     No Known Allergies     Objective:  Physical Exam  Constitutional: He is oriented to person, place, and time. He appears well-developed and well-nourished. He is active and cooperative. No distress.  BP 138/88   Pulse 72   Temp 98.6 F (37 C)   Resp 16   Ht 5\' 10"  (1.778 m)   Wt 216 lb 9.6 oz (98.2 kg)   SpO2 98%   BMI 31.08 kg/m   HENT:  Head: Normocephalic and  atraumatic.  Right Ear: Hearing normal.  Left Ear: Hearing normal.  Eyes: Conjunctivae are normal. No scleral icterus.  Neck: Normal range of motion. Neck supple. No thyromegaly present.  Cardiovascular: Normal rate, regular rhythm and normal heart sounds.  Pulses:      Radial pulses are 2+ on the right side, and 2+ on the left side.  Pulmonary/Chest: Effort normal and breath sounds normal.  Lymphadenopathy:       Head (right side): No tonsillar, no preauricular, no posterior auricular and no occipital adenopathy present.       Head (left side): No tonsillar, no preauricular, no posterior auricular and no occipital adenopathy present.    He has no cervical adenopathy.       Right: No supraclavicular adenopathy present.       Left: No supraclavicular adenopathy present.  Neurological: He is alert and oriented to person, place, and time. No sensory deficit.  Skin: Skin is warm, dry and intact. No rash noted. No cyanosis or erythema. Nails show no clubbing.  Psychiatric: His speech is normal and behavior is normal. His mood appears not anxious. His affect is labile. His affect is not angry, not blunt and not inappropriate. He exhibits a depressed mood.    MMSE score 23/30  Wt Readings from Last 3 Encounters:  02/04/18 216 lb 9.6 oz (98.2 kg)  11/05/17 219 lb 9.6 oz (99.6 kg)  09/20/17 219 lb (99.3 kg)    Assessment & Plan:   Problem List Items Addressed This Visit    CAD (coronary artery disease) with CABG 2011, LIMA to LAD, Free radial to Lt. PDA, VG to Diag and ramus intermedius (Chronic)   Relevant Medications   lisinopril (PRINIVIL,ZESTRIL) 20 MG tablet   nitroGLYCERIN (NITROSTAT) 0.4 MG SL tablet   Hypertension    A little higher than goal today but adequate control.  Continue current regimen.      Relevant Medications   lisinopril (PRINIVIL,ZESTRIL) 20 MG tablet   nitroGLYCERIN (NITROSTAT) 0.4 MG SL tablet   Other Relevant Orders   CBC with Differential/Platelet    Comprehensive metabolic panel   Dyslipidemia    Controlled at last visit.  Update lipids today.      Relevant Orders   Comprehensive metabolic panel   Lipid panel  Hypogonadism male    He did not get my result note regarding his February labs.  He is interested in seeing endocrinology and exploring the possibility of testosterone replacement.      Relevant Orders   Testosterone   Ambulatory referral to Endocrinology   BMI 40.0-44.9, adult ([place])   Major depressive disorder, recurrent episode, mild ([place])    Not particularly well controlled, but stable.  He is not interested in additional treatment at this time.      Diabetes mellitus type 2, uncomplicated ([place]) - Primary    Significant improvement at his last visit.  He has continued to implement additional healthy lifestyle changes.  Await hemoglobin A1c today.      Relevant Medications   lisinopril (PRINIVIL,ZESTRIL) 20 MG tablet   Other Relevant Orders   Comprehensive metabolic panel   Hemoglobin A1c   Microalbumin / creatinine urine ratio   Hearing loss    Desires, but unable to afford, hearing aids.      Memory difficulty    Score 23/30 on MMSE today.  Lower score due in part to minimal education.  Offered referral to neurology, he declined.          Return in about 3 months (around 05/07/2018) for re-evaluation of diabetes, blood pressure and cholesterol.   Fara Chute, PA-C [facility] at [place]

## 2018-02-05 DIAGNOSIS — R8290 Unspecified abnormal findings in urine: Secondary | ICD-10-CM | POA: Diagnosis not present

## 2018-02-05 DIAGNOSIS — I10 Essential (primary) hypertension: Secondary | ICD-10-CM | POA: Diagnosis not present

## 2018-02-05 LAB — COMPREHENSIVE METABOLIC PANEL
ALK PHOS: 81 IU/L (ref 39–117)
ALT: 19 IU/L (ref 0–44)
AST: 21 IU/L (ref 0–40)
Albumin/Globulin Ratio: 2.1 (ref 1.2–2.2)
Albumin: 4.8 g/dL (ref 3.6–4.8)
BILIRUBIN TOTAL: 0.5 mg/dL (ref 0.0–1.2)
BUN / CREAT RATIO: 17 (ref 10–24)
BUN: 14 mg/dL (ref 8–27)
CHLORIDE: 103 mmol/L (ref 96–106)
CO2: 23 mmol/L (ref 20–29)
Calcium: 9.6 mg/dL (ref 8.6–10.2)
Creatinine, Ser: 0.81 mg/dL (ref 0.76–1.27)
GFR calc Af Amer: 111 mL/min/{1.73_m2} (ref >59)
GFR calc non Af Amer: 96 mL/min/{1.73_m2} (ref >59)
GLUCOSE: 126 mg/dL — AB (ref 65–99)
Globulin, Total: 2.3 g/dL (ref 1.5–4.5)
Potassium: 4.8 mmol/L (ref 3.5–5.2)
Sodium: 142 mmol/L (ref 134–144)
Total Protein: 7.1 g/dL (ref 6.0–8.5)

## 2018-02-05 LAB — CBC WITH DIFFERENTIAL/PLATELET
Basophils Absolute: 0 10*3/uL (ref 0.0–0.2)
Basos: 0 %
EOS (ABSOLUTE): 0.3 10*3/uL (ref 0.0–0.4)
EOS: 4 %
HEMATOCRIT: 43.3 % (ref 37.5–51.0)
Hemoglobin: 14.6 g/dL (ref 13.0–17.7)
Immature Grans (Abs): 0 10*3/uL (ref 0.0–0.1)
Immature Granulocytes: 0 %
LYMPHS ABS: 1.9 10*3/uL (ref 0.7–3.1)
Lymphs: 22 %
MCH: 28.1 pg (ref 26.6–33.0)
MCHC: 33.7 g/dL (ref 31.5–35.7)
MCV: 83 fL (ref 79–97)
MONOS ABS: 0.6 10*3/uL (ref 0.1–0.9)
Monocytes: 8 %
Neutrophils Absolute: 5.6 10*3/uL (ref 1.4–7.0)
Neutrophils: 66 %
Platelets: 197 10*3/uL (ref 150–450)
RBC: 5.2 x10E6/uL (ref 4.14–5.80)
RDW: 15.9 % — AB (ref 12.3–15.4)
WBC: 8.5 10*3/uL (ref 3.4–10.8)

## 2018-02-05 LAB — POCT URINALYSIS DIP (MANUAL ENTRY)
BILIRUBIN UA: NEGATIVE
BILIRUBIN UA: NEGATIVE mg/dL
Blood, UA: NEGATIVE
GLUCOSE UA: NEGATIVE mg/dL
LEUKOCYTES UA: NEGATIVE
Nitrite, UA: NEGATIVE
PROTEIN UA: NEGATIVE mg/dL
Urobilinogen, UA: 0.2 E.U./dL
pH, UA: 5.5 (ref 5.0–8.0)

## 2018-02-05 LAB — LIPID PANEL
CHOLESTEROL TOTAL: 129 mg/dL (ref 100–199)
Chol/HDL Ratio: 2.9 ratio (ref 0.0–5.0)
HDL: 44 mg/dL (ref >39)
LDL CALC: 53 mg/dL (ref 0–99)
TRIGLYCERIDES: 159 mg/dL — AB (ref 0–149)
VLDL CHOLESTEROL CAL: 32 mg/dL (ref 5–40)

## 2018-02-05 LAB — TESTOSTERONE: Testosterone: 196 ng/dL — ABNORMAL LOW (ref 264–916)

## 2018-02-05 LAB — HEMOGLOBIN A1C
Est. average glucose Bld gHb Est-mCnc: 140 mg/dL
Hgb A1c MFr Bld: 6.5 % — ABNORMAL HIGH (ref 4.8–5.6)

## 2018-02-05 NOTE — Addendum Note (Signed)
Addended by: Berneice Gandy on: 02/05/2018 06:39 PM   Modules accepted: Orders

## 2018-02-05 NOTE — Addendum Note (Signed)
Addended by: Berneice Gandy on: 02/05/2018 06:22 PM   Modules accepted: Orders

## 2018-02-05 NOTE — Addendum Note (Signed)
Addended by: Berneice Gandy on: 02/05/2018 06:18 PM   Modules accepted: Orders

## 2018-02-06 LAB — MICROALBUMIN / CREATININE URINE RATIO
CREATININE, UR: 222.9 mg/dL
MICROALB/CREAT RATIO: 24.8 mg/g{creat} (ref 0.0–30.0)
MICROALBUM., U, RANDOM: 55.3 ug/mL

## 2018-02-06 LAB — URINE CULTURE: Organism ID, Bacteria: NO GROWTH

## 2018-02-11 ENCOUNTER — Telehealth: Payer: Self-pay | Admitting: Cardiovascular Disease

## 2018-02-11 NOTE — Telephone Encounter (Addendum)
New message    Patient has an episode of passing out on Monday. EMS was called, did not go to the hospital. Feels better today.   Pt c/o Syncope: STAT if syncope occurred within 30 minutes and pt complains of lightheadedness High Priority if episode of passing out, completely, today or in last 24 hours   1. Did you pass out today? NO  2. When is the last time you passed out? Monday 02/10/18  3. Has this occurred multiple times? NO  4. Did you have any symptoms prior to passing out? Felt hot

## 2018-02-11 NOTE — Telephone Encounter (Signed)
Returned call to wife (ok per DPR)- she states patient was at their daughters cooking out yesterday and he was sitting down and felt like he was going to pass out.   She states they lowered him to the ground and he "went out" for ~1 min.  She states he came too and they cooled him off with cool rags and a fan.  A friend is a Airline pilot so he checked his vitals and his BP and O2 was low, so he called EMS to do EKG.  EMS arrived and did EKG-it was stable, no acute issues.   CBG was okay.  Only symptoms prior to this was feeling hot.  No CP, SOB, palpitations.  He feels okay today, no symptoms.   Did not go to hospital.   She believes he was hydrated but may not have been.   She believes this may be related to his Afib.   Advised to send remote transmission to for device RN to review.    Hx: tachy-brady syndrome s/p PPM, Afib, CABG, HTN, HLD  Routed to device clinic to follow.

## 2018-02-11 NOTE — Telephone Encounter (Signed)
Transmission received and reviewed. AF noted yesterday, V rates controlled. Pt presently in AF, burden 72.4% since 01/09/18. [name] reports that feels well today and he has been taking his Tikosyn and Diltiazem as prescribed, no missed doses. I advised her that I'm not sure the AF caused the episode yesterday but it may have contributed. His heart rates have been relatively well controlled aside from 1:1 SVT noted on 01/22/18 @ 171bpm. Will route to Dr. Loletha Grayer for any further recommendations.   Device: Revo MRI [id number] Serial Number: [id number] Date of Interrogation: 11-Feb-2018 09:58:58   Clinical Status Since 09-Jan-2018 Longest  Treated   AT/AF(Monitor)        Monitored   VT (>4 beats) 0   Fast A&V 6 0 sec  AT/AF 344 17 hr      Time in AT/AF 17.4 hr/day (72.4%)       Functional Last Week   Patient Activity 4.6 hr/day        Cardiac Compass Trends (Mar-2018 to [id number])   Therapy Summary  AT/AF  Pace-Terminated Episodes  0   Pacing (% of Time Since 09-Jan-2018)  AS-VS 37.6%   AS-VP 40.9%   AP-VS 18.9%   AP-VP 2.7%   MVP On

## 2018-02-13 NOTE — Telephone Encounter (Signed)
Lucas Porter has a long history of vasodepressor (vagal) syncope. I do not think it was caused by the atrial fibrillation (which has not really bothered him). More likely triggered by the heat. Avoid prolonged exposure to heat and stay well hydrated. In the past triggers have been associated with seeing needles or talking about medical procedures. Needs to lie down, preferably legs elevated as soon as he feels the warning signs (hot, sweaty, flushed). MCr

## 2018-02-13 NOTE — Telephone Encounter (Signed)
Left message to call back  

## 2018-02-13 NOTE — Telephone Encounter (Signed)
Spoke to wife who is aware of recommendations and verbalized understanding.

## 2018-02-13 NOTE — Telephone Encounter (Signed)
Follow Up:; ° ° °Returning your call. °

## 2018-02-14 ENCOUNTER — Ambulatory Visit (INDEPENDENT_AMBULATORY_CARE_PROVIDER_SITE_OTHER): Payer: BLUE CROSS/BLUE SHIELD | Admitting: Pharmacist

## 2018-02-14 DIAGNOSIS — I480 Paroxysmal atrial fibrillation: Secondary | ICD-10-CM

## 2018-02-14 DIAGNOSIS — Z7901 Long term (current) use of anticoagulants: Secondary | ICD-10-CM | POA: Diagnosis not present

## 2018-02-14 LAB — POCT INR: INR: 2.1 (ref 2.0–3.0)

## 2018-03-31 ENCOUNTER — Other Ambulatory Visit: Payer: Self-pay | Admitting: Cardiovascular Disease

## 2018-03-31 ENCOUNTER — Other Ambulatory Visit: Payer: Self-pay

## 2018-04-10 ENCOUNTER — Telehealth: Payer: Self-pay | Admitting: Cardiology

## 2018-04-10 ENCOUNTER — Ambulatory Visit (INDEPENDENT_AMBULATORY_CARE_PROVIDER_SITE_OTHER): Payer: BLUE CROSS/BLUE SHIELD | Admitting: *Deleted

## 2018-04-10 ENCOUNTER — Encounter: Payer: Self-pay | Admitting: *Deleted

## 2018-04-10 DIAGNOSIS — I480 Paroxysmal atrial fibrillation: Secondary | ICD-10-CM

## 2018-04-10 NOTE — Telephone Encounter (Signed)
Confirmed remote transmission w/ pt wife.   

## 2018-04-10 NOTE — Progress Notes (Signed)
Remote pacemaker transmission.   

## 2018-04-29 ENCOUNTER — Other Ambulatory Visit: Payer: Self-pay | Admitting: Cardiovascular Disease

## 2018-05-19 LAB — CUP PACEART REMOTE DEVICE CHECK
Battery Voltage: 2.86 V
Brady Statistic AP VP Percent: 3.56 %
Brady Statistic AS VS Percent: 33.22 %
Brady Statistic RA Percent Paced: 18.46 %
Date Time Interrogation Session: 20190725190423
Implantable Lead Implant Date: 20120229
Implantable Lead Location: 753860
Implantable Pulse Generator Implant Date: 20120229
Lead Channel Impedance Value: 520 Ohm
Lead Channel Impedance Value: 536 Ohm
Lead Channel Sensing Intrinsic Amplitude: 2.728 mV
Lead Channel Setting Pacing Amplitude: 2 V
Lead Channel Setting Pacing Pulse Width: 0.4 ms
MDC IDC LEAD IMPLANT DT: 20120229
MDC IDC LEAD LOCATION: 753859
MDC IDC SET LEADCHNL RV PACING AMPLITUDE: 2.5 V
MDC IDC SET LEADCHNL RV SENSING SENSITIVITY: 0.9 mV
MDC IDC STAT BRADY AP VS PERCENT: 18.63 %
MDC IDC STAT BRADY AS VP PERCENT: 44.59 %
MDC IDC STAT BRADY RV PERCENT PACED: 47.63 %

## 2018-05-20 ENCOUNTER — Ambulatory Visit (INDEPENDENT_AMBULATORY_CARE_PROVIDER_SITE_OTHER): Payer: BLUE CROSS/BLUE SHIELD | Admitting: Pharmacist

## 2018-05-20 DIAGNOSIS — Z7901 Long term (current) use of anticoagulants: Secondary | ICD-10-CM | POA: Diagnosis not present

## 2018-05-20 DIAGNOSIS — I480 Paroxysmal atrial fibrillation: Secondary | ICD-10-CM | POA: Diagnosis not present

## 2018-05-20 LAB — POCT INR: INR: 2.2 (ref 2.0–3.0)

## 2018-05-20 MED ORDER — WARFARIN SODIUM 5 MG PO TABS: ORAL_TABLET | ORAL | 0 refills | Status: DC

## 2018-06-18 ENCOUNTER — Other Ambulatory Visit: Payer: Self-pay | Admitting: *Deleted

## 2018-06-18 DIAGNOSIS — E119 Type 2 diabetes mellitus without complications: Secondary | ICD-10-CM

## 2018-06-18 MED ORDER — METFORMIN HCL 500 MG PO TABS: 500.0000 mg | ORAL_TABLET | Freq: Two times a day (BID) | ORAL | 3 refills | Status: DC

## 2018-06-18 MED ORDER — METFORMIN HCL 500 MG PO TABS: 500.0000 mg | ORAL_TABLET | Freq: Two times a day (BID) | ORAL | 0 refills | Status: DC

## 2018-06-28 ENCOUNTER — Other Ambulatory Visit: Payer: Self-pay | Admitting: Cardiovascular Disease

## 2018-06-30 ENCOUNTER — Ambulatory Visit (INDEPENDENT_AMBULATORY_CARE_PROVIDER_SITE_OTHER): Payer: BLUE CROSS/BLUE SHIELD | Admitting: Pharmacist

## 2018-06-30 DIAGNOSIS — Z7901 Long term (current) use of anticoagulants: Secondary | ICD-10-CM | POA: Diagnosis not present

## 2018-06-30 DIAGNOSIS — I480 Paroxysmal atrial fibrillation: Secondary | ICD-10-CM | POA: Diagnosis not present

## 2018-06-30 LAB — POCT INR: INR: 3.3 — AB (ref 2.0–3.0)

## 2018-07-10 ENCOUNTER — Telehealth: Payer: Self-pay

## 2018-07-10 ENCOUNTER — Encounter: Payer: Medicare Other | Admitting: *Deleted

## 2018-07-10 NOTE — Telephone Encounter (Signed)
LMOVM reminding pt to send remote transmission.   

## 2018-07-11 ENCOUNTER — Encounter: Payer: Self-pay | Admitting: Cardiology

## 2018-07-11 ENCOUNTER — Ambulatory Visit (INDEPENDENT_AMBULATORY_CARE_PROVIDER_SITE_OTHER): Payer: BLUE CROSS/BLUE SHIELD | Admitting: *Deleted

## 2018-07-11 DIAGNOSIS — I495 Sick sinus syndrome: Secondary | ICD-10-CM | POA: Diagnosis not present

## 2018-07-12 NOTE — Progress Notes (Signed)
Remote pacemaker transmission.   

## 2018-07-14 ENCOUNTER — Encounter: Payer: Self-pay | Admitting: Cardiology

## 2018-07-28 ENCOUNTER — Ambulatory Visit (INDEPENDENT_AMBULATORY_CARE_PROVIDER_SITE_OTHER): Payer: BLUE CROSS/BLUE SHIELD | Admitting: Pharmacist Clinician (PhC)/ Clinical Pharmacy Specialist

## 2018-07-28 DIAGNOSIS — I4892 Unspecified atrial flutter: Secondary | ICD-10-CM

## 2018-07-28 DIAGNOSIS — Z7901 Long term (current) use of anticoagulants: Secondary | ICD-10-CM

## 2018-07-28 DIAGNOSIS — I480 Paroxysmal atrial fibrillation: Secondary | ICD-10-CM

## 2018-07-28 LAB — POCT INR: INR: 1.6 — AB (ref 2.0–3.0)

## 2018-07-28 NOTE — Patient Instructions (Signed)
Description   Take 2 tablets today Monday Nov 11, then continue taking 1 tablet daily except 1/2 tablet each Sunday, Tuesday and Thursday.  Repeat INR in 2 weeks

## 2018-08-01 LAB — CUP PACEART REMOTE DEVICE CHECK
Battery Voltage: 2.85 V
Brady Statistic AP VS Percent: 17.88 %
Brady Statistic RA Percent Paced: 16.54 %
Brady Statistic RV Percent Paced: 41.81 %
Date Time Interrogation Session: 20191025222334
Implantable Lead Implant Date: 20120229
Implantable Lead Location: 753859
Implantable Lead Location: 753860
Implantable Pulse Generator Implant Date: 20120229
Lead Channel Impedance Value: 560 Ohm
Lead Channel Setting Pacing Amplitude: 2.5 V
Lead Channel Setting Sensing Sensitivity: 0.9 mV
MDC IDC LEAD IMPLANT DT: 20120229
MDC IDC MSMT LEADCHNL RA IMPEDANCE VALUE: 488 Ohm
MDC IDC MSMT LEADCHNL RA SENSING INTR AMPL: 3.344 mV
MDC IDC SET LEADCHNL RA PACING AMPLITUDE: 2 V
MDC IDC SET LEADCHNL RV PACING PULSEWIDTH: 0.4 ms
MDC IDC STAT BRADY AP VP PERCENT: 2.13 %
MDC IDC STAT BRADY AS VP PERCENT: 40.29 %
MDC IDC STAT BRADY AS VS PERCENT: 39.71 %

## 2018-08-08 ENCOUNTER — Other Ambulatory Visit: Payer: Self-pay | Admitting: Cardiovascular Disease

## 2018-08-11 ENCOUNTER — Ambulatory Visit (INDEPENDENT_AMBULATORY_CARE_PROVIDER_SITE_OTHER): Payer: BLUE CROSS/BLUE SHIELD | Admitting: Pharmacist Clinician (PhC)/ Clinical Pharmacy Specialist

## 2018-08-11 DIAGNOSIS — Z7901 Long term (current) use of anticoagulants: Secondary | ICD-10-CM

## 2018-08-11 DIAGNOSIS — I480 Paroxysmal atrial fibrillation: Secondary | ICD-10-CM | POA: Diagnosis not present

## 2018-08-11 DIAGNOSIS — I4892 Unspecified atrial flutter: Secondary | ICD-10-CM

## 2018-08-11 LAB — POCT INR: INR: 2.5 (ref 2.0–3.0)

## 2018-08-18 ENCOUNTER — Telehealth: Payer: Self-pay | Admitting: General Practice

## 2018-08-18 NOTE — Telephone Encounter (Signed)
Copied from [place] [id number]. Topic: Quick Communication - Rx Refill/Question >> Aug 18, 2018  3:01 PM Sheran Luz wrote: Medication: venlafaxine XR (EFFEXOR-XR) 75 MG 24 hr capsule and venlafaxine XR (EFFEXOR-XR) 150 MG 24 hr capsule   Patient is requesting refill of this medication-advised patient that prescribing physician is no longer with practice and would likely need an appointment to have these filled. Declined appointment.   Has the patient contacted their pharmacy? Yes, pharmacy advised patient to contact office.   Preferred Pharmacy (with phone number or street name): [place] [place] [address] [place] [phone] (Phone) [phone] (Fax)

## 2018-08-18 NOTE — Telephone Encounter (Signed)
LOV was 02-04-18 with [facility] / Patient is requesting venlafaxine which has not been written since 06-2017 / Patient needs an appointment for refills.

## 2018-09-23 ENCOUNTER — Ambulatory Visit (INDEPENDENT_AMBULATORY_CARE_PROVIDER_SITE_OTHER): Payer: BLUE CROSS/BLUE SHIELD | Admitting: Pharmacist Clinician (PhC)/ Clinical Pharmacy Specialist

## 2018-09-23 DIAGNOSIS — I480 Paroxysmal atrial fibrillation: Secondary | ICD-10-CM | POA: Diagnosis not present

## 2018-09-23 DIAGNOSIS — Z7901 Long term (current) use of anticoagulants: Secondary | ICD-10-CM

## 2018-09-23 LAB — POCT INR: INR: 4 — AB (ref 2.0–3.0)

## 2018-09-23 NOTE — Patient Instructions (Signed)
Description   No warfarin today Tuesday January 7, then continue taking 1 tablet daily except 1/2 tablet each Sunday, Tuesday and Thursday.  Repeat INR in 4 weeks

## 2018-10-03 ENCOUNTER — Other Ambulatory Visit: Payer: Self-pay | Admitting: Cardiovascular Disease

## 2018-10-13 ENCOUNTER — Other Ambulatory Visit: Payer: Self-pay | Admitting: Cardiovascular Disease

## 2018-10-13 ENCOUNTER — Ambulatory Visit (INDEPENDENT_AMBULATORY_CARE_PROVIDER_SITE_OTHER): Payer: BLUE CROSS/BLUE SHIELD

## 2018-10-13 DIAGNOSIS — I480 Paroxysmal atrial fibrillation: Secondary | ICD-10-CM

## 2018-10-14 ENCOUNTER — Other Ambulatory Visit: Payer: Self-pay | Admitting: Cardiovascular Disease

## 2018-10-14 NOTE — Telephone Encounter (Signed)
Rx(s) sent to pharmacy electronically.  

## 2018-10-15 NOTE — Progress Notes (Signed)
Remote pacemaker transmission.   

## 2018-10-16 LAB — CUP PACEART REMOTE DEVICE CHECK
Brady Statistic AP VP Percent: 1.43 %
Brady Statistic AP VS Percent: 17.67 %
Brady Statistic AS VP Percent: 42.9 %
Brady Statistic RV Percent Paced: 43.76 %
Date Time Interrogation Session: 20200127205827
Implantable Lead Implant Date: 20120229
Implantable Lead Implant Date: 20120229
Implantable Lead Location: 753859
Lead Channel Impedance Value: 504 Ohm
Lead Channel Impedance Value: 560 Ohm
Lead Channel Sensing Intrinsic Amplitude: 19.103 mV
Lead Channel Setting Pacing Amplitude: 2 V
Lead Channel Setting Pacing Amplitude: 2.5 V
MDC IDC LEAD LOCATION: 753860
MDC IDC MSMT BATTERY VOLTAGE: 2.83 V
MDC IDC MSMT LEADCHNL RA SENSING INTR AMPL: 5.236 mV
MDC IDC PG IMPLANT DT: 20120229
MDC IDC SET LEADCHNL RV PACING PULSEWIDTH: 0.4 ms
MDC IDC SET LEADCHNL RV SENSING SENSITIVITY: 0.9 mV
MDC IDC STAT BRADY AS VS PERCENT: 38 %
MDC IDC STAT BRADY RA PERCENT PACED: 15.72 %

## 2018-10-20 ENCOUNTER — Ambulatory Visit (INDEPENDENT_AMBULATORY_CARE_PROVIDER_SITE_OTHER): Payer: BLUE CROSS/BLUE SHIELD | Admitting: *Deleted

## 2018-10-20 DIAGNOSIS — I480 Paroxysmal atrial fibrillation: Secondary | ICD-10-CM

## 2018-10-20 DIAGNOSIS — Z7901 Long term (current) use of anticoagulants: Secondary | ICD-10-CM

## 2018-10-20 LAB — POCT INR: INR: 3.3 — AB (ref 2.0–3.0)

## 2018-10-20 NOTE — Patient Instructions (Signed)
Description   Skip today's dose, then start taking 1/2 pill everyday except 1 pill on Mondays, Wednesdays and Fridays.  Repeat INR in 3 weeks

## 2018-10-29 ENCOUNTER — Other Ambulatory Visit: Payer: Self-pay | Admitting: Cardiovascular Disease

## 2018-11-10 ENCOUNTER — Other Ambulatory Visit: Payer: Self-pay | Admitting: Cardiovascular Disease

## 2018-11-10 ENCOUNTER — Ambulatory Visit (INDEPENDENT_AMBULATORY_CARE_PROVIDER_SITE_OTHER): Payer: BLUE CROSS/BLUE SHIELD | Admitting: *Deleted

## 2018-11-10 DIAGNOSIS — Z7901 Long term (current) use of anticoagulants: Secondary | ICD-10-CM | POA: Diagnosis not present

## 2018-11-10 DIAGNOSIS — I480 Paroxysmal atrial fibrillation: Secondary | ICD-10-CM

## 2018-11-10 LAB — POCT INR: INR: 2 (ref 2.0–3.0)

## 2018-11-10 NOTE — Patient Instructions (Addendum)
Description   Continue  taking 1/2 pill everyday except 1 pill on Mondays, Wednesdays and Fridays.  Repeat INR in 3 weeks. Call us with any medication changes # [phone].

## 2018-11-24 ENCOUNTER — Other Ambulatory Visit: Payer: Self-pay | Admitting: Cardiovascular Disease

## 2018-12-02 ENCOUNTER — Ambulatory Visit (INDEPENDENT_AMBULATORY_CARE_PROVIDER_SITE_OTHER): Payer: BLUE CROSS/BLUE SHIELD | Admitting: Pharmacist Clinician (PhC)/ Clinical Pharmacy Specialist

## 2018-12-02 ENCOUNTER — Other Ambulatory Visit: Payer: Self-pay

## 2018-12-02 DIAGNOSIS — Z7901 Long term (current) use of anticoagulants: Secondary | ICD-10-CM | POA: Diagnosis not present

## 2018-12-02 DIAGNOSIS — I480 Paroxysmal atrial fibrillation: Secondary | ICD-10-CM | POA: Diagnosis not present

## 2018-12-02 LAB — POCT INR: INR: 3 (ref 2.0–3.0)

## 2018-12-08 ENCOUNTER — Other Ambulatory Visit: Payer: Self-pay | Admitting: Cardiovascular Disease

## 2019-01-02 ENCOUNTER — Other Ambulatory Visit: Payer: Self-pay | Admitting: Cardiovascular Disease

## 2019-01-05 ENCOUNTER — Other Ambulatory Visit: Payer: Self-pay

## 2019-01-05 ENCOUNTER — Ambulatory Visit (INDEPENDENT_AMBULATORY_CARE_PROVIDER_SITE_OTHER): Payer: BLUE CROSS/BLUE SHIELD | Admitting: *Deleted

## 2019-01-05 ENCOUNTER — Other Ambulatory Visit: Payer: Self-pay | Admitting: Cardiovascular Disease

## 2019-01-05 DIAGNOSIS — I480 Paroxysmal atrial fibrillation: Secondary | ICD-10-CM | POA: Diagnosis not present

## 2019-01-05 DIAGNOSIS — Z7901 Long term (current) use of anticoagulants: Secondary | ICD-10-CM | POA: Diagnosis not present

## 2019-01-05 LAB — POCT INR: INR: 4.3 — AB (ref 2.0–3.0)

## 2019-01-05 NOTE — Patient Instructions (Signed)
Description   Spoke with wife and instructed to hold today and hold tomorrow's dose then continue taking 1/2 pill everyday except 1 pill on Mondays, Wednesdays and Fridays.  Repeat INR in 3 weeks. Call us with any medication changes # [phone].

## 2019-01-05 NOTE — Telephone Encounter (Signed)
Clopidogrel refilled,

## 2019-01-12 ENCOUNTER — Ambulatory Visit (INDEPENDENT_AMBULATORY_CARE_PROVIDER_SITE_OTHER): Payer: BLUE CROSS/BLUE SHIELD | Admitting: *Deleted

## 2019-01-12 ENCOUNTER — Other Ambulatory Visit: Payer: Self-pay

## 2019-01-12 ENCOUNTER — Other Ambulatory Visit: Payer: Self-pay | Admitting: Cardiovascular Disease

## 2019-01-12 DIAGNOSIS — I495 Sick sinus syndrome: Secondary | ICD-10-CM

## 2019-01-12 DIAGNOSIS — I480 Paroxysmal atrial fibrillation: Secondary | ICD-10-CM

## 2019-01-12 LAB — CUP PACEART REMOTE DEVICE CHECK
Battery Voltage: 2.81 V
Brady Statistic AP VP Percent: 0.54 %
Brady Statistic AP VS Percent: 7.83 %
Brady Statistic AS VP Percent: 45.14 %
Brady Statistic AS VS Percent: 46.49 %
Brady Statistic RA Percent Paced: 5.19 %
Brady Statistic RV Percent Paced: 50.69 %
Date Time Interrogation Session: 20200427184352
Implantable Lead Implant Date: 20120229
Implantable Lead Implant Date: 20120229
Implantable Lead Location: 753859
Implantable Lead Location: 753860
Implantable Pulse Generator Implant Date: 20120229
Lead Channel Impedance Value: 448 Ohm
Lead Channel Impedance Value: 536 Ohm
Lead Channel Setting Pacing Amplitude: 2.5 V
Lead Channel Setting Pacing Pulse Width: 0.4 ms
Lead Channel Setting Sensing Sensitivity: 0.9 mV

## 2019-01-12 NOTE — Telephone Encounter (Signed)
k-dur 10 Meq refilled.

## 2019-01-20 NOTE — Progress Notes (Signed)
Remote pacemaker transmission.   

## 2019-01-23 ENCOUNTER — Telehealth: Payer: Self-pay

## 2019-01-23 NOTE — Telephone Encounter (Signed)
lmom for prescreen  

## 2019-01-26 ENCOUNTER — Ambulatory Visit (INDEPENDENT_AMBULATORY_CARE_PROVIDER_SITE_OTHER): Payer: BLUE CROSS/BLUE SHIELD | Admitting: *Deleted

## 2019-01-26 ENCOUNTER — Other Ambulatory Visit: Payer: Self-pay | Admitting: Cardiovascular Disease

## 2019-01-26 ENCOUNTER — Other Ambulatory Visit: Payer: Self-pay

## 2019-01-26 DIAGNOSIS — Z7901 Long term (current) use of anticoagulants: Secondary | ICD-10-CM | POA: Diagnosis not present

## 2019-01-26 DIAGNOSIS — I480 Paroxysmal atrial fibrillation: Secondary | ICD-10-CM | POA: Diagnosis not present

## 2019-01-26 LAB — POCT INR: INR: 2.4 (ref 2.0–3.0)

## 2019-02-11 ENCOUNTER — Telehealth: Payer: Self-pay | Admitting: Cardiovascular Disease

## 2019-02-11 ENCOUNTER — Telehealth: Payer: Self-pay | Admitting: *Deleted

## 2019-02-11 NOTE — Telephone Encounter (Signed)
Please add him onto the June 1 clinic.  Might need to bring him in since we need an electrocardiogram but let us try a video visit first.  Otherwise agree with your advice, he should go to the emergency room if he has symptoms lasting for anything more than 30 minutes.

## 2019-02-11 NOTE — Telephone Encounter (Signed)
A message was left, re: follow up visit. 

## 2019-02-11 NOTE — Telephone Encounter (Signed)
Patient is returning call.  °

## 2019-02-11 NOTE — Telephone Encounter (Signed)
Pt agreed to video visit 02/16/19 with Dr. Sallyanne Kuster.. will call EMS if needed prior to appt. Will call back with daughter cell number.      Virtual Visit Pre-Appointment Phone Call  "(Name), I am calling you today to discuss your upcoming appointment. We are currently trying to limit exposure to the virus that causes COVID-19 by seeing patients at home rather than in the office."  1. "What is the BEST phone number to call the day of the visit?" - include this in appointment notes  2. "Do you have or have access to (through a family member/friend) a smartphone with video capability that we can use for your visit?" a. If yes - list this number in appt notes as "cell" (if different from BEST phone #) and list the appointment type as a VIDEO visit in appointment notes b. If no - list the appointment type as a PHONE visit in appointment notes  Confirm consent - "In the setting of the current Covid19 crisis, you are scheduled for a (phone or video) visit with your provider on (date) at (time).  Just as we do with many in-office visits, in order for you to participate in this visit, we must obtain consent.  If you'd like, I can send this to your mychart (if signed up) or email for you to review.  Otherwise, I can obtain your verbal consent now.  All virtual visits are billed to your insurance company just like a normal visit would be.  By agreeing to a virtual visit, we'd like you to understand that the technology does not allow for your provider to perform an examination, and thus may limit your provider's ability to fully assess your condition. If your provider identifies any concerns that need to be evaluated in person, we will make arrangements to do so.  Finally, though the technology is pretty good, we cannot assure that it will always work on either your or our end, and in the setting of a video visit, we may have to convert it to a phone-only visit.  In either situation, we cannot ensure that we have a  secure connection.  Are you willing to proceed?" STAFF: Did the patient verbally acknowledge consent to telehealth visit? Document YES/NO here: YES 3. Advise patient to be prepared - "Two hours prior to your appointment, go ahead and check your blood pressure, pulse, oxygen saturation, and your weight (if you have the equipment to check those) and write them all down. When your visit starts, your provider will ask you for this information. If you have an Apple Watch or Kardia device, please plan to have heart rate information ready on the day of your appointment. Please have a pen and paper handy nearby the day of the visit as well."  4. Give patient instructions for MyChart download to smartphone OR Doximity/Doxy.me as below if video visit (depending on what platform provider is using)  5. Inform patient they will receive a phone call 15 minutes prior to their appointment time (may be from unknown caller ID) so they should be prepared to answer    TELEPHONE CALL NOTE  Lucas Porter has been deemed a candidate for a follow-up tele-health visit to limit community exposure during the Covid-19 pandemic. I spoke with the patient via phone to ensure availability of phone/video source, confirm preferred email & phone number, and discuss instructions and expectations.  I reminded Lucas Porter to be prepared with any vital sign and/or heart rhythm information that  could potentially be obtained via home monitoring, at the time of his visit. I reminded Lucas Porter to expect a phone call prior to his visit.  Stephani Police, RN 02/11/2019 12:12 PM   INSTRUCTIONS FOR DOWNLOADING THE MYCHART APP TO SMARTPHONE  - The patient must first make sure to have activated MyChart and know their login information - If Apple, go to [employer] and type in MyChart in the search bar and download the app. If Android, ask patient to go to [employer] and type in [facility] in the search bar and download the app. The  app is free but as with any other app downloads, their phone may require them to verify saved payment information or Apple/Android password.  - The patient will need to then log into the app with their MyChart username and password, and select  as their healthcare provider to link the account. When it is time for your visit, go to the MyChart app, find appointments, and click Begin Video Visit. Be sure to Select Allow for your device to access the Microphone and Camera for your visit. You will then be connected, and your provider will be with you shortly.  **If they have any issues connecting, or need assistance please contact MyChart service desk [phone])**  **If using a computer, in order to ensure the best quality for their visit they will need to use either of the following Internet Browsers: [employer], or Google Chrome**  IF USING DOXIMITY or DOXY.ME - The patient will receive a link just prior to their visit by text.     FULL LENGTH CONSENT FOR TELE-HEALTH VISIT   I hereby voluntarily request, consent and authorize [facility] and its employed or contracted physicians, physician assistants, nurse practitioners or other licensed health care professionals (the Practitioner), to provide me with telemedicine health care services (the "Services") as deemed necessary by the treating Practitioner. I acknowledge and consent to receive the Services by the Practitioner via telemedicine. I understand that the telemedicine visit will involve communicating with the Practitioner through live audiovisual communication technology and the disclosure of certain medical information by electronic transmission. I acknowledge that I have been given the opportunity to request an in-person assessment or other available alternative prior to the telemedicine visit and am voluntarily participating in the telemedicine visit.  I understand that I have the right to withhold or withdraw  my consent to the use of telemedicine in the course of my care at any time, without affecting my right to future care or treatment, and that the Practitioner or I may terminate the telemedicine visit at any time. I understand that I have the right to inspect all information obtained and/or recorded in the course of the telemedicine visit and may receive copies of available information for a reasonable fee.  I understand that some of the potential risks of receiving the Services via telemedicine include:  Marland Kitchen Delay or interruption in medical evaluation due to technological equipment failure or disruption; . Information transmitted may not be sufficient (e.g. poor resolution of images) to allow for appropriate medical decision making by the Practitioner; and/or  . In rare instances, security protocols could fail, causing a breach of personal health information.  Furthermore, I acknowledge that it is my responsibility to provide information about my medical history, conditions and care that is complete and accurate to the best of my ability. I acknowledge that Practitioner's advice, recommendations, and/or decision may be based on factors not  within their control, such as incomplete or inaccurate data provided by me or distortions of diagnostic images or specimens that may result from electronic transmissions. I understand that the practice of medicine is not an exact science and that Practitioner makes no warranties or guarantees regarding treatment outcomes. I acknowledge that I will receive a copy of this consent concurrently upon execution via email to the email address I last provided but may also request a printed copy by calling the office of [facility].    I understand that my insurance will be billed for this visit.   I have read or had this consent read to me. . I understand the contents of this consent, which adequately explains the benefits and risks of the Services being provided via  telemedicine.  . I have been provided ample opportunity to ask questions regarding this consent and the Services and have had my questions answered to my satisfaction. . I give my informed consent for the services to be provided through the use of telemedicine in my medical care  By participating in this telemedicine visit I agree to the above.

## 2019-02-11 NOTE — Telephone Encounter (Signed)
Lm to call back ./cy 

## 2019-02-11 NOTE — Telephone Encounter (Signed)
  Wife is calling because the past couple days Lucas Porter has had a tightness in his chest. Denies chest pain. He has had some shortness of breath and he is also tired. Wife wants to know if he should send a transmission from his pacemaker or make an appt to be seen.

## 2019-02-11 NOTE — Telephone Encounter (Signed)
Pts wife called to report that the pt has been experiencing chest "tightness" for the past week.. he reports it is on and off and not stopping him from doing his ADL's but just concerned it is has been happening.  He has not had to take any nitro but I advised her to instruct him to keep it near by if he needs it.. she says it really has not lasted long enough.. he denies SOB, dizziness, palpitations, headache associated with it. She says he feels fine today... and asking for an OV of any type in the near future... I advised her that I will forward to Dr. Sallyanne Kuster for review.Marland Kitchen last OV outside of pacer and INR was 09/2017 with Rosaria Ferries PA... stress Myoview 2012..   Dr. Sallyanne Kuster has virtual appt openings 02/16/19 but will check and see if he would rather pt has in person visit or any testing prior.   Pt agrees to go to the ER if symptoms reoccur or worsens.

## 2019-02-12 ENCOUNTER — Telehealth: Payer: Self-pay | Admitting: Cardiovascular Disease

## 2019-02-12 NOTE — Telephone Encounter (Signed)
New Message    Morey Hummingbird and Theadora Rama are calling to speak with Lucas Porter    Please call back

## 2019-02-16 ENCOUNTER — Encounter: Payer: Self-pay | Admitting: Cardiovascular Disease

## 2019-02-16 ENCOUNTER — Telehealth: Payer: Self-pay | Admitting: *Deleted

## 2019-02-16 ENCOUNTER — Telehealth (INDEPENDENT_AMBULATORY_CARE_PROVIDER_SITE_OTHER): Payer: BC Managed Care – PPO | Admitting: Cardiovascular Disease

## 2019-02-16 VITALS — BP 146/91 | HR 64 | Ht 70.0 in | Wt 215.0 lb

## 2019-02-16 DIAGNOSIS — Z7901 Long term (current) use of anticoagulants: Secondary | ICD-10-CM | POA: Diagnosis not present

## 2019-02-16 DIAGNOSIS — Z950 Presence of cardiac pacemaker: Secondary | ICD-10-CM

## 2019-02-16 DIAGNOSIS — I4819 Other persistent atrial fibrillation: Secondary | ICD-10-CM

## 2019-02-16 DIAGNOSIS — E119 Type 2 diabetes mellitus without complications: Secondary | ICD-10-CM

## 2019-02-16 DIAGNOSIS — Z45010 Encounter for checking and testing of cardiac pacemaker pulse generator [battery]: Secondary | ICD-10-CM

## 2019-02-16 DIAGNOSIS — I25700 Atherosclerosis of coronary artery bypass graft(s), unspecified, with unstable angina pectoris: Secondary | ICD-10-CM

## 2019-02-16 DIAGNOSIS — I480 Paroxysmal atrial fibrillation: Secondary | ICD-10-CM

## 2019-02-16 DIAGNOSIS — Z01818 Encounter for other preprocedural examination: Secondary | ICD-10-CM

## 2019-02-16 DIAGNOSIS — R001 Bradycardia, unspecified: Secondary | ICD-10-CM

## 2019-02-16 DIAGNOSIS — E785 Hyperlipidemia, unspecified: Secondary | ICD-10-CM

## 2019-02-16 DIAGNOSIS — R55 Syncope and collapse: Secondary | ICD-10-CM

## 2019-02-16 DIAGNOSIS — I10 Essential (primary) hypertension: Secondary | ICD-10-CM

## 2019-02-16 MED ORDER — ISOSORBIDE MONONITRATE ER 30 MG PO TB24: 30.0000 mg | ORAL_TABLET | Freq: Every day | ORAL | 11 refills | Status: DC

## 2019-02-16 MED ORDER — ASPIRIN EC 81 MG PO TBEC: 81.0000 mg | DELAYED_RELEASE_TABLET | Freq: Every day | ORAL | 3 refills | Status: DC

## 2019-02-16 NOTE — Telephone Encounter (Signed)
Call placed to the patient's wife per the dpr to go over the instructions for the cardiac cath and pacemaker gen change on Friday 02/20/2019.  They have been made aware that the instructions will be mailed as well. They do not use MyChart.  The wife has been advised that she cannot come into the hospital with the patient but must wait in the car.  The patient will also pick up his surgical scrub when he gets his lab work done at the [facility]. They have been advised to call if they have any questions.  Labs: Your provider would like for you on 02/17/2019 to the [facility] office to have the following labs drawn: BMET, CBC and PT/INR. You do not need an appointment for the lab. Once in our office lobby there is a podium where you can sign in and ring the doorbell to alert [place] that you are here. The lab is open from 8:00 am to 4:30 pm; closed for lunch from 12:45pm-1:45pm.  After you have had the labs drawn at the [facility] office, please go to [facility], to have the Coronavirus test completed. Your appointment is at 11:55 am. This is a drive thru test only. Please be sure to have the labs done at the [facility] office before having the coronavirus test. You will need to be quarantined until the cardiac cath on 02/20/2019.  Medication instructions in preparation for your procedure: Hold the warfarin starting today 02/16/2019 Hold all diabetic medications the morning of the procedure. Please hold the Metformin the morning of the procedure       COVID-19 Pre-Screening Questions:  . In the past 7 to 10 days have you had a cough,  shortness of breath, headache, congestion, fever (100 or greater) body aches, chills, sore throat, or sudden loss of taste or sense of smell?  No . Have you been around anyone with known Covid 19. No . Have you been around anyone who is awaiting Covid 19 test results in the past 7 to 10 days? No . Have you been around anyone who has been  exposed to Covid 19, or has mentioned symptoms of Covid 19 within the past 7 to 10 days? No  If you have any concerns/questions about symptoms patients report during screening (either on the phone or at [facility]). Contact the provider seeing the patient or DOD for further guidance.  If neither are available contact a member of the leadership team.

## 2019-02-16 NOTE — H&P (View-Only) (Signed)
Virtual Visit via Video Note   This visit type was conducted due to national recommendations for restrictions regarding the COVID-19 Pandemic (e.g. social distancing) in an effort to limit this patient's exposure and mitigate transmission in our community.  Due to his co-morbid illnesses, this patient is at least at moderate risk for complications without adequate follow up.  This format is felt to be most appropriate for this patient at this time.  All issues noted in this document were discussed and addressed.  A limited physical exam was performed with this format.  Please refer to the patient's chart for his consent to telehealth for [facility].   Date:  02/16/2019   ID:  Lucas Porter, DOB 04/02/1956, MRN [id number]  Patient Location: Home Provider Location: Home  PCP:  Patient, No Pcp Per  Cardiologist:  [name], MD  Electrophysiologist:  Thompson Grayer, MD  Evaluation Performed:  Follow-Up Visit  Chief Complaint: Frequent episodes of chest pain  History of Present Illness:    Lucas Porter is a 63 y.o. 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  He has been free of angina for several years but just the last few days has began experiencing chest discomfort again.  He had prolonged episodes that occurred at rest on Friday and Saturday, but each episode resolved after he took a single sublingual nitroglycerin tablet.  Otherwise he has noticed that the symptoms are primarily related to physical activity, but simply walking a longer distance or vacuuming will bring on the chest.  Poorly tolerant of heat which makes  him become dizzy and short of breath.  He does not have leg edema and has not experienced syncope, although he periodically has prodromal symptoms of vasovagal syncope that do not reach full loss of consciousness.  He denies orthopnea, PND, focal neurological complaints or bleeding problems.  He is compliant with warfarin anticoagulation and has not had any falls or injuries.  He denies cough, hemoptysis or wheezing.   The patient does not have symptoms concerning for COVID-19 infection (fever, chills, cough, or new shortness of breath).    Past Medical History:  Diagnosis Date   Anxiety    Atrial fibrillation (HCC)    paroxysmal   Atrial flutter ([place])    Coagulopathy ([place])    persistent elevation of ACT during 10/17/11 hospitalization   Coronary artery disease    s/p CABG 2010, s/p Successful PCI  10/17/11 of Distal Left Main and the Left Circumflex with a Promus Element DES stent -- 4.0 mm x 16 mm postdilated to 4.12 mm    DDD (degenerative disc disease)    Depression    Dysrhythmia    afib   Facial asymmetry 2010   pt. stated it was very temporary, never seen by doctor for it.    GERD (gastroesophageal reflux disease)    Heart murmur    Hyperlipidemia    Hypertension    Kidney stones    Presence of permanent cardiac pacemaker    Medtronic    S/P coronary artery stent placement to distal Lt. main and Lt. Cx 10/17/11   Tachycardia-bradycardia syndrome ([place]) 2/12   s/p PPM (MDT) by Dr Blanch Media   Past Surgical History:  Procedure Laterality Date   ATRIAL FIBRILLATION ABLATION  04/24/12, 12/02/12   PVI x2 and CTI ablation by Dr Rayann Heman   ATRIAL FIBRILLATION ABLATION N/A 04/24/2012   Procedure: ATRIAL FIBRILLATION ABLATION;  Surgeon: Thompson Grayer, MD;  Location: [facility] CATH LAB;  Service: Cardiovascular;  Laterality: N/A;   ATRIAL FIBRILLATION ABLATION N/A 12/02/2012   Procedure: ATRIAL FIBRILLATION ABLATION;  Surgeon: Thompson Grayer, MD;  Location: [facility] CATH LAB;  Service:  Cardiovascular;  Laterality: N/A;   CORONARY ANGIOPLASTY  10/19/11   CORONARY ARTERY BYPASS GRAFT  2010   CYSTOSCOPY  2007 ?   treatment for Kidney stones    INGUINAL HERNIA REPAIR Left 01/04/2016   Procedure: LAPAROSCOPIC LEFT INGUINAL HERNIA WITH MESH;  Surgeon: Ralene Ok, MD;  Location: [place];  Service: General;  Laterality: Left;   INSERT / REPLACE / REMOVE PACEMAKER  2/12   Medtronic   INSERTION OF MESH Left 01/04/2016   Procedure: INSERTION OF MESH;  Surgeon: Ralene Ok, MD;  Location: [place];  Service: General;  Laterality: Left;   LAPAROSCOPIC LYSIS OF ADHESIONS Left 01/04/2016   Procedure: LAPAROSCOPIC LYSIS OF ADHESIONS;  Surgeon: Ralene Ok, MD;  Location: [place];  Service: General;  Laterality: Left;   LEFT HEART CATHETERIZATION WITH CORONARY/GRAFT ANGIOGRAM N/A 10/17/2011   Procedure: LEFT HEART CATHETERIZATION WITH Beatrix Fetters;  Surgeon: [name], MD;  Location: [facility] CATH LAB;  Service: Cardiovascular;  Laterality: N/A;   LEFT HEART CATHETERIZATION WITH CORONARY/GRAFT ANGIOGRAM N/A 02/08/2012   Procedure: LEFT HEART CATHETERIZATION WITH Beatrix Fetters;  Surgeon: [name], MD;  Location: [place] CATH LAB;  Service: Cardiovascular;  Laterality: N/A;   NM MYOCAR PERF WALL MOTION  03/23/2011   Low risk   PERCUTANEOUS CORONARY STENT INTERVENTION (PCI-S)  10/17/2011   Procedure: PERCUTANEOUS CORONARY STENT INTERVENTION (PCI-S);  Surgeon: [name], MD;  Location: [facility] CATH LAB;  Service: Cardiovascular;;   TEE WITHOUT CARDIOVERSION  04/23/2012   Procedure: TRANSESOPHAGEAL ECHOCARDIOGRAM (TEE);  Surgeon: Josue Hector, MD;  Location[facility] ENDOSCOPY;  Service: Cardiovascular;  Laterality: N/A;  spoke to pt about time change from 11a to 12p/DL   TEE WITHOUT CARDIOVERSION N/A 12/01/2012   Procedure: TRANSESOPHAGEAL ECHOCARDIOGRAM (TEE);  Surgeon: [name], MD;  Location: [facility] ENDOSCOPY;  Service: Cardiovascular;  Laterality: N/A;      Current Meds  Medication Sig   atenolol (TENORMIN) 100 MG tablet TAKE 1 TABLET BY MOUTH ONCE DAILY   clonazePAM (KLONOPIN) 0.5 MG tablet Take 1 tablet (0.5 mg total) by mouth 2 (two) times daily as needed for anxiety.   clopidogrel (PLAVIX) 75 MG tablet Take 1 tablet by mouth once daily   diltiazem (DILT-XR) 180 MG 24 hr capsule Take 1 capsule (180 mg total) by mouth daily. NEED OV.   dofetilide (TIKOSYN) 500 MCG capsule Take 1 capsule by mouth twice daily   fish oil-omega-3 fatty acids 1000 MG capsule Take 2 g by mouth daily.    fluticasone (FLONASE) 50 MCG/ACT nasal spray    hydroxypropyl methylcellulose / hypromellose (ISOPTO TEARS / GONIOVISC) 2.5 % ophthalmic solution Place 1 drop into both eyes as needed for dry eyes.   lisinopril (PRINIVIL,ZESTRIL) 20 MG tablet Take 1 tablet (20 mg total) by mouth daily.   metFORMIN (GLUCOPHAGE) 500 MG tablet Take 1 tablet (500 mg total) by mouth 2 (two) times daily with a meal.   nitroGLYCERIN (NITROSTAT) 0.4 MG SL tablet Place 1 tablet (0.4 mg total) under the tongue every 5 (five) minutes as needed for chest pain.   oxybutynin (DITROPAN) 5 MG tablet  potassium chloride (K-DUR) 10 MEQ tablet Take 1 tablet by mouth once daily   rosuvastatin (CRESTOR) 40 MG tablet TAKE 1 TABLET BY MOUTH ONCE DAILY   venlafaxine XR (EFFEXOR-XR) 150 MG 24 hr capsule TAKE ONE CAPSULE BY MOUTH ONCE DAILY WITH  THE  75  MG  TO  TOTAL  225  MG  DAILY   venlafaxine XR (EFFEXOR-XR) 75 MG 24 hr capsule TAKE ONE CAPSULE BY MOUTH ONCE DAILY TAKE  WITH  150MG   TO  EQUAL  A  TOTAL  OF  225MG   DAILY   warfarin (COUMADIN) 5 MG tablet TAKE 1/2 TO 1 (ONE-HALF TO ONE) TABLET BY MOUTH ONCE DAILY AS DIRECTED BY COUMADIN CLINIC     Allergies:   Patient has no known allergies.   Social History   Tobacco Use   Smoking status: Former Smoker    Years: 1.00    Types: Cigarettes    Last attempt to quit: 12/18/1963    Years since quitting: 55.2   Smokeless tobacco:  Former Systems developer    Quit date: 10/03/2012   Tobacco comment: quit smoking in 1970's  Substance Use Topics   Alcohol use: Yes    Alcohol/week: 1.0 standard drinks    Types: 1 Cans of beer per week    Comment: rare   Drug use: Yes    Types: Marijuana    Comment: sometimes (down from every day)     Family Hx: The patient's family history includes Alzheimer's disease in his father; Colon cancer in his mother; Coronary artery disease in his unknown relative; Diabetes in his brother; Heart attack in his brother; Heart disease in his brother and father; Hypertension in his brother, brother, and sister; Stroke in his brother. There is no history of Stomach cancer or Pancreatic cancer.  ROS:   Please see the history of present illness.     All other systems reviewed and are negative.   Prior CV studies:   The following studies were reviewed today:  Pacemaker download from March 2020 showing device at RRT.  Labs/Other Tests and Data Reviewed:    EKG:  An ECG dated 09/20/2017 was personally reviewed today and demonstrated:  Atrial fibrillation, ventricular paced rhythm  Recent Labs: No results found for requested labs within last 8760 hours.   Recent Lipid Panel Lab Results  Component Value Date/Time   CHOL 129 02/04/2018 11:03 AM   TRIG 159 (H) 02/04/2018 11:03 AM   HDL 44 02/04/2018 11:03 AM   CHOLHDL 2.9 02/04/2018 11:03 AM   CHOLHDL 5.9 (H) 09/24/2016 10:10 AM   LDLCALC 53 02/04/2018 11:03 AM    Wt Readings from Last 3 Encounters:  02/16/19 215 lb (97.5 kg)  02/04/18 216 lb 9.6 oz (98.2 kg)  11/05/17 219 lb 9.6 oz (99.6 kg)     Objective:    Vital Signs:  BP (!) 146/91    Pulse 64    Ht 5\' 10"  (1.778 m)    Wt 215 lb (97.5 kg)    BMI 30.85 kg/m    VITAL SIGNS:  reviewed GEN:  no acute distress EYES:  sclerae anicteric, EOMI - Extraocular Movements Intact RESPIRATORY:  normal respiratory effort, symmetric expansion CARDIOVASCULAR:  no peripheral edema SKIN:  no rash,  lesions or ulcers. MUSCULOSKELETAL:  no obvious deformities. NEURO:  alert and oriented x 3, no obvious focal deficit PSYCH:  normal affect Obese  ASSESSMENT & PLAN:    1. CAD: He describes recent onset angina occurring with low level  of activity, consistent with unstable angina pectoris. His bypass grafts are now 63 years old. His last revascularization procedure was in 2013 when he received a stent across the left main into the left circumflex coronary artery.  His ECG is likely to be nondiagnostic since he often has ventricular pacing.  I think he should undergo coronary angiography. It' s likely he will require angioplasty and stents to either native vessel or a bypass graft. This procedure has been fully reviewed with the patient and informed consent has been obtained.  Described the increased screening protocols are related to the coronavirus outbreak.  Will have to stop his warfarin for a few days anyway.  Advised him to seek emergency care if he has prolonged severe chest pain lasting more than 30 minutes, or if he fails to have symptoms relief after he takes 3 consecutive sublingual nitroglycerin tablets.  It is possible that we will not find a revascularizable stenosis.  We will add isosorbide mononitrate 30 mg once daily starting today.  He is already on beta-blockers and calcium channel blockers. 2. AFib: Atrial fibrillation is now greater than 80% and it appears that continued antiarrhythmic therapy with dofetilide is simply not worth it.  Will discuss stopping dofetilide after his cardiac catheterization.  Stopping this medication would allow this to use ranolazine as an antianginal, if necessary.   3. Anticoagulation: I do not think he needs "bridging" to undergo the cardiac catheterization.Relatively low embolic risk: CHADSVasc 2 (HTN, CAD). 4. PM at ERI: The device was initially implanted for tachycardia-bradycardia syndrome, but he is heading towards permanent atrial fibrillation.  He is  not pacemaker dependent, paces the ventricle roughly 46% of the time.  Since we are stopping his warfarin for the heart catheterization it would be optimal to perform his generator change out on the same day.  We will try to figure out the logistics of that, but told him to be prepared for both procedures.  Since he mics needs to start dual antiplatelet therapy, it would be great if we could do the pacemaker change out first, but this is not mandatory. This procedure has been fully reviewed with the patient and informed consent has been obtained. 5. Vasovagal syncope: Ever since the pacemaker was implanted he has noted frequent syncopal events, but still has occasional presyncope, likely related to vasa depressor events.  He will still benefit from pacemaker therapy to prevent syncope, even if he is moving towards permanent atrial fibrillation. 6. Hypercholesterolemia: LDL 1 year ago was 39 and he is still the same statin prescription.  We can check a fasting lipid profile when he is in the hospital. 7. DM:  Most recent hemoglobin A1c 6.5%, good control. 8. HTN: Generally avoids potent vasodilators due to his tendency to have vasovagal syncope.  Blood pressure control is fair.  COVID-19 Education: The signs and symptoms of COVID-19 were discussed with the patient and how to seek care for testing (follow up with PCP or arrange E-visit).  The importance of social distancing was discussed today.  Time:   Today, I have spent 33 minutes with the patient with telehealth technology discussing the above problems.     Medication Adjustments/Labs and Tests Ordered: Current medicines are reviewed at length with the patient today.  Concerns regarding medicines are outlined above.   Tests Ordered: Orders Placed This Encounter  Procedures   Basic metabolic panel   CBC   Protime-INR    Medication Changes: Meds ordered this encounter  Medications  isosorbide mononitrate (IMDUR) 30 MG 24 hr tablet     Sig: Take 1 tablet (30 mg total) by mouth daily.    Dispense:  30 tablet    Refill:  11   aspirin EC 81 MG tablet    Sig: Take 1 tablet (81 mg total) by mouth daily.    Dispense:  90 tablet    Refill:  3   Patient Instructions  Medication Instructions:  START Isosorbide Mononitrate 30 mg once daily START Aspirin 81 mg once daily  If you need a refill on your cardiac medications before your next appointment, please call your pharmacy.   Lab work: Your provider would like for you to go to the [facility] office on 02/17/2019 to have the following labs drawn: BMET, CBC and PT/INR. You do not need an appointment for the lab. Once in our office lobby there is a podium where you can sign in and ring the doorbell to alert [place] that you are here. The lab is open from 8:00 am to 4:30 pm; closed for lunch from 12:45pm-1:45pm.  If you have labs (blood work) drawn today and your tests are completely normal, you will receive your results only by: [place] (if you have MyChart) OR A paper copy in the mail If you have any lab test that is abnormal or we need to change your treatment, we will call you to review the results.  Testing/Procedures: Your physician has requested that you have a cardiac catheterization. Cardiac catheterization is used to diagnose and/or treat various heart conditions. Doctors may recommend this procedure for a number of different reasons. The most common reason is to evaluate chest pain. Chest pain can be a symptom of coronary artery disease (CAD), and cardiac catheterization can show whether plaque is narrowing or blocking your hearts arteries. This procedure is also used to evaluate the valves, as well as measure the blood flow and oxygen levels in different parts of your heart. For further information please visit [email or web address]. Please follow instruction sheet, as given.  Your physician has also recommended that you have a pacemaker/defibrillator generator change  (battery change). Please follow the instructions below, located under the special instructions section   Follow-Up: We will call you to schedule the follow up appointments.  Any Other Special Instructions Will Be Listed Below (If Applicable).    [place] [address] Dept: [phone] Loc: [place]  02/16/2019  You are scheduled for a Cardiac Catheterization on Friday, June 5 with Dr. Daneen Schick.  1. Please arrive at the [facility] (Main Entrance A) at [facility]: [address] [place], [place] [address] at 7:00 AM (This time is two hours before your procedure to ensure your preparation). Free valet parking service is available.   Special note: Every effort is made to have your procedure done on time. Please understand that emergencies sometimes delay scheduled procedures.  2. Diet: Do not eat solid foods after midnight.  The patient may have clear liquids until 5am upon the day of the procedure.  3. Labs: Your provider would like for you on 02/17/2019 to the [facility] office to have the following labs drawn: BMET, CBC and PT/INR. You do not need an appointment for the lab. Once in our office lobby there is a podium where you can sign in and ring the doorbell to alert [place] that you are here. The lab is open  from 8:00 am to 4:30 pm; closed for lunch from 12:45pm-1:45pm.  After you have had the labs drawn at the [facility] office, please go to [place], to have the Coronavirus test completed. Your appointment is at 11:55 am. This is a drive thru test only. Please be sure to have the labs done at the [facility] office before having the coronavirus test. You will need to be quarantined until the cardiac cath on 02/20/2019.   4. Medication instructions in preparation for your procedure: Hold the warfarin starting today  02/16/2019 Hold all diabetic medications the morning of the procedure. Please hold the Metformin the morning of the procedure and the next 48 hours.  On the morning of your procedure, take your Aspirin and Plavix/Clopidogrel and any morning medicines NOT listed above.  You may use sips of water.  5. Plan for one night stay--bring personal belongings. 6. Bring a current list of your medications and current insurance cards. 7. You MUST have a responsible person to drive you home. 8. Someone MUST be with you the first 24 hours after you arrive home or your discharge will be delayed. 9. Please wear clothes that are easy to get on and off and wear slip-on shoes.  Thank you for allowing [place] to care for you!   -- [facility]  Your physician has also recommended that you have a pacemaker/defibrillator generator change (battery change). Please follow the instructions below, located under the special instructions section    Supplemental Discharge Instructions for  Pacemaker/Defibrillator Patients  ACTIVITY No heavy lifting or vigorous activity with your left/right arm for 6 to 8 weeks.  Do not raise your left/right arm above your head for one week.  Gradually raise your affected arm as drawn below.           __  NO DRIVING for     ; you may begin driving on     .  WOUND CARE - Keep the wound area clean and dry.  Do not get this area wet for one week. No showers for one week; you may shower on     . - The tape/steri-strips on your wound will fall off; do not pull them off.  No bandage is needed on the site.  DO  NOT apply any creams, oils, or ointments to the wound area. - If you notice any drainage or discharge from the wound, any swelling or bruising at the site, or you develop a fever > 101? F after you are discharged home, call the office at once.  SPECIAL INSTRUCTIONS - You are still able to use cellular telephones; use the ear opposite the side where you  have your pacemaker/defibrillator.  Avoid carrying your cellular phone near your device. - When traveling through airports, show security personnel your identification card to avoid being screened in the metal detectors.  Ask the security personnel to use the hand wand. - Avoid arc welding equipment, MRI testing (magnetic resonance imaging), TENS units (transcutaneous nerve stimulators).  Call the office for questions about other devices. - Avoid electrical appliances that are in poor condition or are not properly grounded. - Microwave ovens are safe to be near or to operate.  ADDITIONAL INFORMATION FOR DEFIBRILLATOR PATIENTS SHOULD YOUR DEVICE GO OFF: - If your device goes off ONCE and you feel fine afterward, notify the device clinic nurses. - If your device goes off ONCE and you do not feel well afterward, call 911. - If  your device goes off TWICE, call 911. - If your device goes off [place], call 911.  DO NOT DRIVE YOURSELF OR A FAMILY MEMBER WITH A DEFIBRILLATOR TO THE HOSPITAL--CALL 911.     [place] - Preparing For Surgery  Before surgery, you can play an important role. Because skin is not sterile, your skin needs to be as free of germs as possible. You can reduce the number of germs on your skin by washing with CHG (chlorahexidine gluconate) Soap before surgery.  CHG is an antiseptic cleaner which kills germs and bonds with the skin to continue killing germs even after washing.   Please do not use if you have an allergy to CHG or antibacterial soaps.  If your skin becomes reddened/irritated stop using the CHG.   Do not shave (including legs and underarms) for at least 48 hours prior to first CHG shower.  It is OK to shave your face.  Please follow these instructions carefully:  1.  Shower the night before surgery and the morning of surgery with CHG.  2.  If you choose to wash your hair, wash your hair first as usual with your normal shampoo.  3.  After you  shampoo, rinse your hair and body thoroughly to remove the shampoo.  4.  Use CHG as you would any other liquid soap.  You can apply CHG directly to the skin and wash gently with a clean washcloth. 5.  Apply the CHG Soap to your body ONLY FROM THE NECK DOWN.  Do not use on open wounds or open sores.  Avoid contact with your eyes, ears, mouth and genitals (private parts).  Wash genitals (private parts) with your normal soap.  6.  Wash thoroughly, paying special attention to the area where your surgery will be performed.  7.  Thoroughly rise your body with warm water from the neck down.   8.  DO NOT shower/wash with your normal soap after using and rinsing off the CHG soap.  9.  Pat yourself dry with a clean towel.           10.  Wear clean pajamas.           11.  Place clean sheets on your bed the night of your first shower and do not sleep with pets.  Day of Surgery: Do not apply any deodorants/lotions.  Please wear clean clothes to the hospital/surgery center.           Disposition:  Follow up 3 months after his cardiac catheterization pacemaker generator change  Signed, [name], MD  02/16/2019 5:26 PM    [place]

## 2019-02-16 NOTE — Patient Instructions (Addendum)
Medication Instructions:  START Isosorbide Mononitrate 30 mg once daily START Aspirin 81 mg once daily until you restart your warfarin.  If you need a refill on your cardiac medications before your next appointment, please call your pharmacy.   Lab work: Your provider would like for you to go to the [facility] office on 02/17/2019 to have the following labs drawn: BMET, CBC and PT/INR. You do not need an appointment for the lab. Once in our office lobby there is a podium where you can sign in and ring the doorbell to alert [place] that you are here. The lab is open from 8:00 am to 4:30 pm; closed for lunch from 12:45pm-1:45pm.  If you have labs (blood work) drawn today and your tests are completely normal, you will receive your results only by: [facility] (if you have MyChart) OR A paper copy in the mail If you have any lab test that is abnormal or we need to change your treatment, we will call you to review the results.  Testing/Procedures: Your physician has requested that you have a cardiac catheterization. Cardiac catheterization is used to diagnose and/or treat various heart conditions. Doctors may recommend this procedure for a number of different reasons. The most common reason is to evaluate chest pain. Chest pain can be a symptom of coronary artery disease (CAD), and cardiac catheterization can show whether plaque is narrowing or blocking your heart's arteries. This procedure is also used to evaluate the valves, as well as measure the blood flow and oxygen levels in different parts of your heart. For further information please visit [email or web address]. Please follow instruction sheet, as given.  Your physician has also recommended that you have a pacemaker/defibrillator generator change (battery change). Please follow the instructions below, located under the special instructions section   Follow-Up: We will call you to schedule the follow up appointments.  Any Other Special  Instructions Will Be Listed Below (If Applicable).    [address] [place] [address] Dept: [phone] Loc: [place]  02/16/2019  You are scheduled for a Cardiac Catheterization on Friday, June 5 with Dr. Daneen Schick.  1. Please arrive at the [facility] (Main Entrance A) at [facility]: [address] [place], [place] [address] at 7:00 AM (This time is two hours before your procedure to ensure your preparation). Free valet parking service is available.   Special note: Every effort is made to have your procedure done on time. Please understand that emergencies sometimes delay scheduled procedures.  2. Diet: Do not eat solid foods after midnight.  The patient may have clear liquids until 5am upon the day of the procedure.  3. Labs: Your provider would like for you on 02/17/2019 to the [facility] office to have the following labs drawn: BMET, CBC and PT/INR. You do not need an appointment for the lab. Once in our office lobby there is a podium where you can sign in and ring the doorbell to alert [place] that you are here. The lab is open from 8:00 am to 4:30 pm; closed for lunch from 12:45pm-1:45pm.  After you have had the labs drawn at the [facility] office, please go to [place], to have the Coronavirus test completed. Your appointment is at 11:55 am. This is a drive thru test only. Please be sure to have the labs done at the [facility] office before having the coronavirus test. You  will need to be quarantined until the cardiac cath on 02/20/2019.   4. Medication instructions in preparation for your procedure: Hold the warfarin starting today 02/16/2019 Hold all diabetic medications the morning of the procedure. Please hold the Metformin the morning of the procedure and the next 48 hours.  On the morning of your procedure, take your Aspirin  and Plavix/Clopidogrel and any morning medicines NOT listed above.  You may use sips of water.  5. Plan for one night stay--bring personal belongings. 6. Bring a current list of your medications and current insurance cards. 7. You MUST have a responsible person to drive you home. 8. Someone MUST be with you the first 24 hours after you arrive home or your discharge will be delayed. 9. Please wear clothes that are easy to get on and off and wear slip-on shoes.  Thank you for allowing [place] to care for you!   -- [facility]  Your physician has also recommended that you have a pacemaker/defibrillator generator change (battery change). Please follow the instructions below, located under the special instructions section    Supplemental Discharge Instructions for  Pacemaker/Defibrillator Patients  ACTIVITY No heavy lifting or vigorous activity with your left/right arm for 6 to 8 weeks.  Do not raise your left/right arm above your head for one week.  Gradually raise your affected arm as drawn below.           __  NO DRIVING for     ; you may begin driving on     .  WOUND CARE - Keep the wound area clean and dry.  Do not get this area wet for one week. No showers for one week; you may shower on     . - The tape/steri-strips on your wound will fall off; do not pull them off.  No bandage is needed on the site.  DO  NOT apply any creams, oils, or ointments to the wound area. - If you notice any drainage or discharge from the wound, any swelling or bruising at the site, or you develop a fever > 101? F after you are discharged home, call the office at once.  SPECIAL INSTRUCTIONS - You are still able to use cellular telephones; use the ear opposite the side where you have your pacemaker/defibrillator.  Avoid carrying your cellular phone near your device. - When traveling through airports, show security personnel your identification card to avoid being screened in the  metal detectors.  Ask the security personnel to use the hand wand. - Avoid arc welding equipment, MRI testing (magnetic resonance imaging), TENS units (transcutaneous nerve stimulators).  Call the office for questions about other devices. - Avoid electrical appliances that are in poor condition or are not properly grounded. - Microwave ovens are safe to be near or to operate.  ADDITIONAL INFORMATION FOR DEFIBRILLATOR PATIENTS SHOULD YOUR DEVICE GO OFF: - If your device goes off ONCE and you feel fine afterward, notify the device clinic nurses. - If your device goes off ONCE and you do not feel well afterward, call 911. - If your device goes off TWICE, call 911. - If your device goes off [place], call 911.  DO NOT DRIVE YOURSELF OR A FAMILY MEMBER WITH A DEFIBRILLATOR TO THE HOSPITAL-CALL 911.     [facility] - Preparing For Surgery  Before surgery, you can play an important role. Because skin is not sterile, your skin needs to be as free of  germs as possible. You can reduce the number of germs on your skin by washing with CHG (chlorahexidine gluconate) Soap before surgery.  CHG is an antiseptic cleaner which kills germs and bonds with the skin to continue killing germs even after washing.   Please do not use if you have an allergy to CHG or antibacterial soaps.  If your skin becomes reddened/irritated stop using the CHG.   Do not shave (including legs and underarms) for at least 48 hours prior to first CHG shower.  It is OK to shave your face.  Please follow these instructions carefully:  1.  Shower the night before surgery and the morning of surgery with CHG.  2.  If you choose to wash your hair, wash your hair first as usual with your normal shampoo.  3.  After you shampoo, rinse your hair and body thoroughly to remove the shampoo.  4.  Use CHG as you would any other liquid soap.  You can apply CHG directly to the skin and wash gently with a clean washcloth. 5.  Apply  the CHG Soap to your body ONLY FROM THE NECK DOWN.  Do not use on open wounds or open sores.  Avoid contact with your eyes, ears, mouth and genitals (private parts).  Wash genitals (private parts) with your normal soap.  6.  Wash thoroughly, paying special attention to the area where your surgery will be performed.  7.  Thoroughly rise your body with warm water from the neck down.   8.  DO NOT shower/wash with your normal soap after using and rinsing off the CHG soap.  9.  Pat yourself dry with a clean towel.           10.  Wear clean pajamas.           11.  Place clean sheets on your bed the night of your first shower and do not sleep with pets.  Day of Surgery: Do not apply any deodorants/lotions.  Please wear clean clothes to the hospital/surgery center.

## 2019-02-16 NOTE — Progress Notes (Signed)
Virtual Visit via Video Note   This visit type was conducted due to national recommendations for restrictions regarding the COVID-19 Pandemic (e.g. social distancing) in an effort to limit this patient's exposure and mitigate transmission in our community.  Due to his co-morbid illnesses, this patient is at least at moderate risk for complications without adequate follow up.  This format is felt to be most appropriate for this patient at this time.  All issues noted in this document were discussed and addressed.  A limited physical exam was performed with this format.  Please refer to the patient's chart for his consent to telehealth for [facility].   Date:  02/16/2019   ID:  Lucas Porter, DOB Jan 23, 1956, MRN [id number]  Patient Location: Home Provider Location: Home  PCP:  Patient, No Pcp Per  Cardiologist:  [name], MD  Electrophysiologist:  Thompson Grayer, MD  Evaluation Performed:  Follow-Up Visit  Chief Complaint: Frequent episodes of chest pain  History of Present Illness:    SWAYZE PRIES is a 63 y.o. 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  He has been free of angina for several years but just the last few days has began experiencing chest discomfort again.  He had prolonged episodes that occurred at rest on Friday and Saturday, but each episode resolved after he took a single sublingual nitroglycerin tablet.  Otherwise he has noticed that the symptoms are primarily related to physical activity, but simply walking a longer distance or vacuuming will bring on the chest.  Poorly tolerant of heat which makes  him become dizzy and short of breath.  He does not have leg edema and has not experienced syncope, although he periodically has prodromal symptoms of vasovagal syncope that do not reach full loss of consciousness.  He denies orthopnea, PND, focal neurological complaints or bleeding problems.  He is compliant with warfarin anticoagulation and has not had any falls or injuries.  He denies cough, hemoptysis or wheezing.   The patient does not have symptoms concerning for COVID-19 infection (fever, chills, cough, or new shortness of breath).    Past Medical History:  Diagnosis Date   Anxiety    Atrial fibrillation (HCC)    paroxysmal   Atrial flutter ([place])    Coagulopathy ([place])    persistent elevation of ACT during 10/17/11 hospitalization   Coronary artery disease    s/p CABG 2010, s/p Successful PCI  10/17/11 of Distal Left Main and the Left Circumflex with a Promus Element DES stent -- 4.0 mm x 16 mm postdilated to 4.12 mm    DDD (degenerative disc disease)    Depression    Dysrhythmia    afib   Facial asymmetry 2010   pt. stated it was very temporary, never seen by doctor for it.    GERD (gastroesophageal reflux disease)    Heart murmur    Hyperlipidemia    Hypertension    Kidney stones    Presence of permanent cardiac pacemaker    Medtronic    S/P coronary artery stent placement to distal Lt. main and Lt. Cx 10/17/11   Tachycardia-bradycardia syndrome ([place]) 2/12   s/p PPM (MDT) by Dr Blanch Media   Past Surgical History:  Procedure Laterality Date   ATRIAL FIBRILLATION ABLATION  04/24/12, 12/02/12   PVI x2 and CTI ablation by Dr Rayann Heman   ATRIAL FIBRILLATION ABLATION N/A 04/24/2012   Procedure: ATRIAL FIBRILLATION ABLATION;  Surgeon: Thompson Grayer, MD;  Location: [facility] CATH LAB;  Service: Cardiovascular;  Laterality: N/A;   ATRIAL FIBRILLATION ABLATION N/A 12/02/2012   Procedure: ATRIAL FIBRILLATION ABLATION;  Surgeon: Thompson Grayer, MD;  Location: [facility] CATH LAB;  Service:  Cardiovascular;  Laterality: N/A;   CORONARY ANGIOPLASTY  10/19/11   CORONARY ARTERY BYPASS GRAFT  2010   CYSTOSCOPY  2007 ?   treatment for Kidney stones    INGUINAL HERNIA REPAIR Left 01/04/2016   Procedure: LAPAROSCOPIC LEFT INGUINAL HERNIA WITH MESH;  Surgeon: Ralene Ok, MD;  Location: [place];  Service: General;  Laterality: Left;   INSERT / REPLACE / REMOVE PACEMAKER  2/12   Medtronic   INSERTION OF MESH Left 01/04/2016   Procedure: INSERTION OF MESH;  Surgeon: Ralene Ok, MD;  Location: [place];  Service: General;  Laterality: Left;   LAPAROSCOPIC LYSIS OF ADHESIONS Left 01/04/2016   Procedure: LAPAROSCOPIC LYSIS OF ADHESIONS;  Surgeon: Ralene Ok, MD;  Location: [place];  Service: General;  Laterality: Left;   LEFT HEART CATHETERIZATION WITH CORONARY/GRAFT ANGIOGRAM N/A 10/17/2011   Procedure: LEFT HEART CATHETERIZATION WITH Beatrix Fetters;  Surgeon: [name], MD;  Location: [place] CATH LAB;  Service: Cardiovascular;  Laterality: N/A;   LEFT HEART CATHETERIZATION WITH CORONARY/GRAFT ANGIOGRAM N/A 02/08/2012   Procedure: LEFT HEART CATHETERIZATION WITH Beatrix Fetters;  Surgeon: [name], MD;  Location: [place] CATH LAB;  Service: Cardiovascular;  Laterality: N/A;   NM MYOCAR PERF WALL MOTION  03/23/2011   Low risk   PERCUTANEOUS CORONARY STENT INTERVENTION (PCI-S)  10/17/2011   Procedure: PERCUTANEOUS CORONARY STENT INTERVENTION (PCI-S);  Surgeon: [name], MD;  Location: [facility] CATH LAB;  Service: Cardiovascular;;   TEE WITHOUT CARDIOVERSION  04/23/2012   Procedure: TRANSESOPHAGEAL ECHOCARDIOGRAM (TEE);  Surgeon: Josue Hector, MD;  Location: [facility] ENDOSCOPY;  Service: Cardiovascular;  Laterality: N/A;  spoke to pt about time change from 11a to 12p/DL   TEE WITHOUT CARDIOVERSION N/A 12/01/2012   Procedure: TRANSESOPHAGEAL ECHOCARDIOGRAM (TEE);  Surgeon: [name], MD;  Location: [facility] ENDOSCOPY;  Service: Cardiovascular;  Laterality: N/A;      Current Meds  Medication Sig   atenolol (TENORMIN) 100 MG tablet TAKE 1 TABLET BY MOUTH ONCE DAILY   clonazePAM (KLONOPIN) 0.5 MG tablet Take 1 tablet (0.5 mg total) by mouth 2 (two) times daily as needed for anxiety.   clopidogrel (PLAVIX) 75 MG tablet Take 1 tablet by mouth once daily   diltiazem (DILT-XR) 180 MG 24 hr capsule Take 1 capsule (180 mg total) by mouth daily. NEED OV.   dofetilide (TIKOSYN) 500 MCG capsule Take 1 capsule by mouth twice daily   fish oil-omega-3 fatty acids 1000 MG capsule Take 2 g by mouth daily.    fluticasone (FLONASE) 50 MCG/ACT nasal spray    hydroxypropyl methylcellulose / hypromellose (ISOPTO TEARS / GONIOVISC) 2.5 % ophthalmic solution Place 1 drop into both eyes as needed for dry eyes.   lisinopril (PRINIVIL,ZESTRIL) 20 MG tablet Take 1 tablet (20 mg total) by mouth daily.   metFORMIN (GLUCOPHAGE) 500 MG tablet Take 1 tablet (500 mg total) by mouth 2 (two) times daily with a meal.   nitroGLYCERIN (NITROSTAT) 0.4 MG SL tablet Place 1 tablet (0.4 mg total) under the tongue every 5 (five) minutes as needed for chest pain.   oxybutynin (DITROPAN) 5 MG tablet  potassium chloride (K-DUR) 10 MEQ tablet Take 1 tablet by mouth once daily   rosuvastatin (CRESTOR) 40 MG tablet TAKE 1 TABLET BY MOUTH ONCE DAILY   venlafaxine XR (EFFEXOR-XR) 150 MG 24 hr capsule TAKE ONE CAPSULE BY MOUTH ONCE DAILY WITH  THE  75  MG  TO  TOTAL  225  MG  DAILY   venlafaxine XR (EFFEXOR-XR) 75 MG 24 hr capsule TAKE ONE CAPSULE BY MOUTH ONCE DAILY TAKE  WITH  150MG   TO  EQUAL  A  TOTAL  OF  225MG   DAILY   warfarin (COUMADIN) 5 MG tablet TAKE 1/2 TO 1 (ONE-HALF TO ONE) TABLET BY MOUTH ONCE DAILY AS DIRECTED BY COUMADIN CLINIC     Allergies:   Patient has no known allergies.   Social History   Tobacco Use   Smoking status: Former Smoker    Years: 1.00    Types: Cigarettes    Last attempt to quit: 12/18/1963    Years since quitting: 55.2   Smokeless tobacco:  Former Systems developer    Quit date: 10/03/2012   Tobacco comment: quit smoking in 1970's  Substance Use Topics   Alcohol use: Yes    Alcohol/week: 1.0 standard drinks    Types: 1 Cans of beer per week    Comment: rare   Drug use: Yes    Types: Marijuana    Comment: sometimes (down from every day)     Family Hx: The patient's family history includes Alzheimer's disease in his father; Colon cancer in his mother; Coronary artery disease in his unknown relative; Diabetes in his brother; Heart attack in his brother; Heart disease in his brother and father; Hypertension in his brother, brother, and sister; Stroke in his brother. There is no history of Stomach cancer or Pancreatic cancer.  ROS:   Please see the history of present illness.     All other systems reviewed and are negative.   Prior CV studies:   The following studies were reviewed today:  Pacemaker download from March 2020 showing device at RRT.  Labs/Other Tests and Data Reviewed:    EKG:  An ECG dated 09/20/2017 was personally reviewed today and demonstrated:  Atrial fibrillation, ventricular paced rhythm  Recent Labs: No results found for requested labs within last 8760 hours.   Recent Lipid Panel Lab Results  Component Value Date/Time   CHOL 129 02/04/2018 11:03 AM   TRIG 159 (H) 02/04/2018 11:03 AM   HDL 44 02/04/2018 11:03 AM   CHOLHDL 2.9 02/04/2018 11:03 AM   CHOLHDL 5.9 (H) 09/24/2016 10:10 AM   LDLCALC 53 02/04/2018 11:03 AM    Wt Readings from Last 3 Encounters:  02/16/19 215 lb (97.5 kg)  02/04/18 216 lb 9.6 oz (98.2 kg)  11/05/17 219 lb 9.6 oz (99.6 kg)     Objective:    Vital Signs:  BP (!) 146/91    Pulse 64    Ht 5\' 10"  (1.778 m)    Wt 215 lb (97.5 kg)    BMI 30.85 kg/m    VITAL SIGNS:  reviewed GEN:  no acute distress EYES:  sclerae anicteric, EOMI - Extraocular Movements Intact RESPIRATORY:  normal respiratory effort, symmetric expansion CARDIOVASCULAR:  no peripheral edema SKIN:  no rash,  lesions or ulcers. MUSCULOSKELETAL:  no obvious deformities. NEURO:  alert and oriented x 3, no obvious focal deficit PSYCH:  normal affect Obese  ASSESSMENT & PLAN:    1. CAD: He describes recent onset angina occurring with low level  of activity, consistent with unstable angina pectoris. His bypass grafts are now 62 years old. His last revascularization procedure was in 2013 when he received a stent across the left main into the left circumflex coronary artery.  His ECG is likely to be nondiagnostic since he often has ventricular pacing.  I think he should undergo coronary angiography. It' s likely he will require angioplasty and stents to either native vessel or a bypass graft. This procedure has been fully reviewed with the patient and informed consent has been obtained.  Described the increased screening protocols are related to the coronavirus outbreak.  Will have to stop his warfarin for a few days anyway.  Advised him to seek emergency care if he has prolonged severe chest pain lasting more than 30 minutes, or if he fails to have symptoms relief after he takes 3 consecutive sublingual nitroglycerin tablets.  It is possible that we will not find a revascularizable stenosis.  We will add isosorbide mononitrate 30 mg once daily starting today.  He is already on beta-blockers and calcium channel blockers. 2. AFib: Atrial fibrillation is now greater than 80% and it appears that continued antiarrhythmic therapy with dofetilide is simply not worth it.  Will discuss stopping dofetilide after his cardiac catheterization.  Stopping this medication would allow this to use ranolazine as an antianginal, if necessary.   3. Anticoagulation: I do not think he needs "bridging" to undergo the cardiac catheterization.Relatively low embolic risk: CHADSVasc 2 (HTN, CAD). 4. PM at ERI: The device was initially implanted for tachycardia-bradycardia syndrome, but he is heading towards permanent atrial fibrillation.  He is  not pacemaker dependent, paces the ventricle roughly 46% of the time.  Since we are stopping his warfarin for the heart catheterization it would be optimal to perform his generator change out on the same day.  We will try to figure out the logistics of that, but told him to be prepared for both procedures.  Since he mics needs to start dual antiplatelet therapy, it would be great if we could do the pacemaker change out first, but this is not mandatory. This procedure has been fully reviewed with the patient and informed consent has been obtained. 5. Vasovagal syncope: Ever since the pacemaker was implanted he has noted frequent syncopal events, but still has occasional presyncope, likely related to vasa depressor events.  He will still benefit from pacemaker therapy to prevent syncope, even if he is moving towards permanent atrial fibrillation. 6. Hypercholesterolemia: LDL 1 year ago was 52 and he is still the same statin prescription.  We can check a fasting lipid profile when he is in the hospital. 7. DM:  Most recent hemoglobin A1c 6.5%, good control. 8. HTN: Generally avoids potent vasodilators due to his tendency to have vasovagal syncope.  Blood pressure control is fair.  COVID-19 Education: The signs and symptoms of COVID-19 were discussed with the patient and how to seek care for testing (follow up with PCP or arrange E-visit).  The importance of social distancing was discussed today.  Time:   Today, I have spent 33 minutes with the patient with telehealth technology discussing the above problems.     Medication Adjustments/Labs and Tests Ordered: Current medicines are reviewed at length with the patient today.  Concerns regarding medicines are outlined above.   Tests Ordered: Orders Placed This Encounter  Procedures   Basic metabolic panel   CBC   Protime-INR    Medication Changes: Meds ordered this encounter  Medications  isosorbide mononitrate (IMDUR) 30 MG 24 hr tablet     Sig: Take 1 tablet (30 mg total) by mouth daily.    Dispense:  30 tablet    Refill:  11   aspirin EC 81 MG tablet    Sig: Take 1 tablet (81 mg total) by mouth daily.    Dispense:  90 tablet    Refill:  3   Patient Instructions  Medication Instructions:  START Isosorbide Mononitrate 30 mg once daily START Aspirin 81 mg once daily  If you need a refill on your cardiac medications before your next appointment, please call your pharmacy.   Lab work: Your provider would like for you to go to the [facility] office on 02/17/2019 to have the following labs drawn: BMET, CBC and PT/INR. You do not need an appointment for the lab. Once in our office lobby there is a podium where you can sign in and ring the doorbell to alert [place] that you are here. The lab is open from 8:00 am to 4:30 pm; closed for lunch from 12:45pm-1:45pm.  If you have labs (blood work) drawn today and your tests are completely normal, you will receive your results only by: [place] (if you have MyChart) OR A paper copy in the mail If you have any lab test that is abnormal or we need to change your treatment, we will call you to review the results.  Testing/Procedures: Your physician has requested that you have a cardiac catheterization. Cardiac catheterization is used to diagnose and/or treat various heart conditions. Doctors may recommend this procedure for a number of different reasons. The most common reason is to evaluate chest pain. Chest pain can be a symptom of coronary artery disease (CAD), and cardiac catheterization can show whether plaque is narrowing or blocking your hearts arteries. This procedure is also used to evaluate the valves, as well as measure the blood flow and oxygen levels in different parts of your heart. For further information please visit [email or web address]. Please follow instruction sheet, as given.  Your physician has also recommended that you have a pacemaker/defibrillator generator change  (battery change). Please follow the instructions below, located under the special instructions section   Follow-Up: We will call you to schedule the follow up appointments.  Any Other Special Instructions Will Be Listed Below (If Applicable).    [address] [place] [address] Dept: [phone] Loc: [place]  02/16/2019  You are scheduled for a Cardiac Catheterization on Friday, June 5 with Dr. Daneen Schick.  1. Please arrive at the [facility] (Main Entrance A) at [facility]: [address] [place], [place] [address] at 7:00 AM (This time is two hours before your procedure to ensure your preparation). Free valet parking service is available.   Special note: Every effort is made to have your procedure done on time. Please understand that emergencies sometimes delay scheduled procedures.  2. Diet: Do not eat solid foods after midnight.  The patient may have clear liquids until 5am upon the day of the procedure.  3. Labs: Your provider would like for you on 02/17/2019 to the [facility] office to have the following labs drawn: BMET, CBC and PT/INR. You do not need an appointment for the lab. Once in our office lobby there is a podium where you can sign in and ring the doorbell to alert [place] that you are here. The lab is open  from 8:00 am to 4:30 pm; closed for lunch from 12:45pm-1:45pm.  After you have had the labs drawn at the [facility] office, please go to [place], to have the Coronavirus test completed. Your appointment is at 11:55 am. This is a drive thru test only. Please be sure to have the labs done at the [facility] office before having the coronavirus test. You will need to be quarantined until the cardiac cath on 02/20/2019.   4. Medication instructions in preparation for your procedure: Hold the warfarin starting today  02/16/2019 Hold all diabetic medications the morning of the procedure. Please hold the Metformin the morning of the procedure and the next 48 hours.  On the morning of your procedure, take your Aspirin and Plavix/Clopidogrel and any morning medicines NOT listed above.  You may use sips of water.  5. Plan for one night stay--bring personal belongings. 6. Bring a current list of your medications and current insurance cards. 7. You MUST have a responsible person to drive you home. 8. Someone MUST be with you the first 24 hours after you arrive home or your discharge will be delayed. 9. Please wear clothes that are easy to get on and off and wear slip-on shoes.  Thank you for allowing [place] to care for you!   -- [facility]  Your physician has also recommended that you have a pacemaker/defibrillator generator change (battery change). Please follow the instructions below, located under the special instructions section    Supplemental Discharge Instructions for  Pacemaker/Defibrillator Patients  ACTIVITY No heavy lifting or vigorous activity with your left/right arm for 6 to 8 weeks.  Do not raise your left/right arm above your head for one week.  Gradually raise your affected arm as drawn below.           __  NO DRIVING for     ; you may begin driving on     .  WOUND CARE - Keep the wound area clean and dry.  Do not get this area wet for one week. No showers for one week; you may shower on     . - The tape/steri-strips on your wound will fall off; do not pull them off.  No bandage is needed on the site.  DO  NOT apply any creams, oils, or ointments to the wound area. - If you notice any drainage or discharge from the wound, any swelling or bruising at the site, or you develop a fever > 101? F after you are discharged home, call the office at once.  SPECIAL INSTRUCTIONS - You are still able to use cellular telephones; use the ear opposite the side where you  have your pacemaker/defibrillator.  Avoid carrying your cellular phone near your device. - When traveling through airports, show security personnel your identification card to avoid being screened in the metal detectors.  Ask the security personnel to use the hand wand. - Avoid arc welding equipment, MRI testing (magnetic resonance imaging), TENS units (transcutaneous nerve stimulators).  Call the office for questions about other devices. - Avoid electrical appliances that are in poor condition or are not properly grounded. - Microwave ovens are safe to be near or to operate.  ADDITIONAL INFORMATION FOR DEFIBRILLATOR PATIENTS SHOULD YOUR DEVICE GO OFF: - If your device goes off ONCE and you feel fine afterward, notify the device clinic nurses. - If your device goes off ONCE and you do not feel well afterward, call 911. - If  your device goes off TWICE, call 911. - If your device goes off [place], call 911.  DO NOT DRIVE YOURSELF OR A FAMILY MEMBER WITH A DEFIBRILLATOR TO THE HOSPITAL--CALL 911.     [place] - Preparing For Surgery  Before surgery, you can play an important role. Because skin is not sterile, your skin needs to be as free of germs as possible. You can reduce the number of germs on your skin by washing with CHG (chlorahexidine gluconate) Soap before surgery.  CHG is an antiseptic cleaner which kills germs and bonds with the skin to continue killing germs even after washing.   Please do not use if you have an allergy to CHG or antibacterial soaps.  If your skin becomes reddened/irritated stop using the CHG.   Do not shave (including legs and underarms) for at least 48 hours prior to first CHG shower.  It is OK to shave your face.  Please follow these instructions carefully:  1.  Shower the night before surgery and the morning of surgery with CHG.  2.  If you choose to wash your hair, wash your hair first as usual with your normal shampoo.  3.  After you  shampoo, rinse your hair and body thoroughly to remove the shampoo.  4.  Use CHG as you would any other liquid soap.  You can apply CHG directly to the skin and wash gently with a clean washcloth. 5.  Apply the CHG Soap to your body ONLY FROM THE NECK DOWN.  Do not use on open wounds or open sores.  Avoid contact with your eyes, ears, mouth and genitals (private parts).  Wash genitals (private parts) with your normal soap.  6.  Wash thoroughly, paying special attention to the area where your surgery will be performed.  7.  Thoroughly rise your body with warm water from the neck down.   8.  DO NOT shower/wash with your normal soap after using and rinsing off the CHG soap.  9.  Pat yourself dry with a clean towel.           10.  Wear clean pajamas.           11.  Place clean sheets on your bed the night of your first shower and do not sleep with pets.  Day of Surgery: Do not apply any deodorants/lotions.  Please wear clean clothes to the hospital/surgery center.           Disposition:  Follow up 3 months after his cardiac catheterization pacemaker generator change  Signed, [name], MD  02/16/2019 5:26 PM    [place]

## 2019-02-17 ENCOUNTER — Other Ambulatory Visit: Payer: Self-pay

## 2019-02-17 ENCOUNTER — Other Ambulatory Visit (HOSPITAL_COMMUNITY)
Admission: RE | Admit: 2019-02-17 | Discharge: 2019-02-17 | Disposition: A | Discharge status: Home / Self Care | Payer: BC Managed Care – PPO | Source: Ambulatory Visit | Attending: Interventional Cardiology | Admitting: Interventional Cardiology

## 2019-02-17 DIAGNOSIS — I480 Paroxysmal atrial fibrillation: Secondary | ICD-10-CM

## 2019-02-17 DIAGNOSIS — Z1159 Encounter for screening for other viral diseases: Secondary | ICD-10-CM | POA: Insufficient documentation

## 2019-02-17 DIAGNOSIS — I25110 Atherosclerotic heart disease of native coronary artery with unstable angina pectoris: Secondary | ICD-10-CM

## 2019-02-17 DIAGNOSIS — Z01818 Encounter for other preprocedural examination: Secondary | ICD-10-CM

## 2019-02-17 DIAGNOSIS — I4892 Unspecified atrial flutter: Secondary | ICD-10-CM

## 2019-02-17 DIAGNOSIS — Z7901 Long term (current) use of anticoagulants: Secondary | ICD-10-CM

## 2019-02-18 ENCOUNTER — Telehealth: Payer: Self-pay

## 2019-02-18 LAB — CBC WITH DIFFERENTIAL/PLATELET
Basophils Absolute: 0 10*3/uL (ref 0.0–0.2)
Basos: 0 %
EOS (ABSOLUTE): 0.2 10*3/uL (ref 0.0–0.4)
Eos: 2 %
Hematocrit: 44.5 % (ref 37.5–51.0)
Hemoglobin: 15 g/dL (ref 13.0–17.7)
Immature Grans (Abs): 0 10*3/uL (ref 0.0–0.1)
Immature Granulocytes: 0 %
Lymphocytes Absolute: 1.9 10*3/uL (ref 0.7–3.1)
Lymphs: 21 %
MCH: 29 pg (ref 26.6–33.0)
MCHC: 33.7 g/dL (ref 31.5–35.7)
MCV: 86 fL (ref 79–97)
Monocytes Absolute: 0.5 10*3/uL (ref 0.1–0.9)
Monocytes: 6 %
Neutrophils Absolute: 6.4 10*3/uL (ref 1.4–7.0)
Neutrophils: 71 %
Platelets: 186 10*3/uL (ref 150–450)
RBC: 5.18 x10E6/uL (ref 4.14–5.80)
RDW: 14 % (ref 11.6–15.4)
WBC: 9 10*3/uL (ref 3.4–10.8)

## 2019-02-18 LAB — BASIC METABOLIC PANEL
BUN/Creatinine Ratio: 19 (ref 10–24)
BUN: 19 mg/dL (ref 8–27)
CO2: 21 mmol/L (ref 20–29)
Calcium: 9.9 mg/dL (ref 8.6–10.2)
Chloride: 100 mmol/L (ref 96–106)
Creatinine, Ser: 0.98 mg/dL (ref 0.76–1.27)
GFR calc Af Amer: 95 mL/min/{1.73_m2} (ref >59)
GFR calc non Af Amer: 82 mL/min/{1.73_m2} (ref >59)
Glucose: 160 mg/dL — ABNORMAL HIGH (ref 65–99)
Potassium: 4.4 mmol/L (ref 3.5–5.2)
Sodium: 141 mmol/L (ref 134–144)

## 2019-02-18 LAB — NOVEL CORONAVIRUS, NAA (HOSP ORDER, SEND-OUT TO REF LAB; TAT 18-24 HRS): SARS-CoV-2, NAA: NOT DETECTED

## 2019-02-18 LAB — PT AND PTT
INR: 1.9 — ABNORMAL HIGH (ref 0.8–1.2)
Prothrombin Time: 19.3 s — ABNORMAL HIGH (ref 9.1–12.0)
aPTT: 52 s — ABNORMAL HIGH (ref 24–33)

## 2019-02-18 NOTE — Telephone Encounter (Signed)
lmom for prescreen  

## 2019-02-19 ENCOUNTER — Telehealth: Payer: Self-pay | Admitting: *Deleted

## 2019-02-19 NOTE — Telephone Encounter (Addendum)
Pt contacted pre-catheterization scheduled at [facility] for: Friday February 20, 2019 9 AM/Pacer generator change 1 PM Verified arrival time and place: [place] Entrance A at: 7 AM  Covid-19 test date:02/17/19 not detected  Nothing to eat or drink after midnight prior to the procedures. Contrast allergy: no  Hold; Warfarin-02/16/19 until post procedure. Metformin-day of procedure and 48 hours post procedure.  Except AM meds can be  taken pre-cath with sip of water including: ASA 81 mg-pt should take per Dr Sallyanne Kuster Plavix 75 mg -pt should take per Dr Sallyanne Kuster  Confirmed patient has responsible person to drive home post procedure and observe 24 hours after arriving home: yes  Due to Covid-19 pandemic no visitors are allowed in the hospital (unless cognitive impairment).  Their designated party will be called when their procedure is over for an update and to arrange pick up.  Patients are required to wear a mask when they enter the hospital.        COVID-19 Pre-Screening Questions:  . In the past 7 to 10 days have you had a cough,  shortness of breath, headache, congestion, fever (100 or greater) body aches, chills, sore throat, or sudden loss of taste or sense of smell? no . Have you been around anyone with known Covid 19? no . Have you been around anyone who is awaiting Covid 19 test results in the past 7 to 10 days? no . Have you been around anyone who has been exposed to Covid 19, or has mentioned symptoms of Covid 19 within the past 7 to 10 days? No  I reviewed procedure/mask/visitor/Covid 19 screening questions with patient's wife (DPR), she verbalized understanding.

## 2019-02-19 NOTE — Telephone Encounter (Signed)
Patient returned your call.

## 2019-02-20 ENCOUNTER — Ambulatory Visit (HOSPITAL_COMMUNITY)
Admission: RE | Disposition: A | Discharge status: Home / Self Care | Payer: Self-pay | Source: Home / Self Care | Attending: Interventional Cardiology

## 2019-02-20 ENCOUNTER — Other Ambulatory Visit: Payer: Self-pay

## 2019-02-20 ENCOUNTER — Ambulatory Visit (HOSPITAL_COMMUNITY)
Admission: RE | Admit: 2019-02-20 | Discharge: 2019-02-20 | Disposition: A | Discharge status: Home / Self Care | Payer: BC Managed Care – PPO | Attending: Interventional Cardiology | Admitting: Interventional Cardiology

## 2019-02-20 ENCOUNTER — Encounter (HOSPITAL_COMMUNITY)
Admission: RE | Disposition: A | Discharge status: Home / Self Care | Payer: Self-pay | Source: Home / Self Care | Attending: Interventional Cardiology

## 2019-02-20 DIAGNOSIS — I2510 Atherosclerotic heart disease of native coronary artery without angina pectoris: Secondary | ICD-10-CM | POA: Diagnosis present

## 2019-02-20 DIAGNOSIS — Z8249 Family history of ischemic heart disease and other diseases of the circulatory system: Secondary | ICD-10-CM | POA: Diagnosis not present

## 2019-02-20 DIAGNOSIS — Z79899 Other long term (current) drug therapy: Secondary | ICD-10-CM | POA: Insufficient documentation

## 2019-02-20 DIAGNOSIS — I25119 Atherosclerotic heart disease of native coronary artery with unspecified angina pectoris: Secondary | ICD-10-CM | POA: Diagnosis not present

## 2019-02-20 DIAGNOSIS — Z7902 Long term (current) use of antithrombotics/antiplatelets: Secondary | ICD-10-CM | POA: Diagnosis not present

## 2019-02-20 DIAGNOSIS — Z955 Presence of coronary angioplasty implant and graft: Secondary | ICD-10-CM | POA: Diagnosis not present

## 2019-02-20 DIAGNOSIS — I2582 Chronic total occlusion of coronary artery: Secondary | ICD-10-CM | POA: Diagnosis not present

## 2019-02-20 DIAGNOSIS — Z823 Family history of stroke: Secondary | ICD-10-CM | POA: Insufficient documentation

## 2019-02-20 DIAGNOSIS — E7800 Pure hypercholesterolemia, unspecified: Secondary | ICD-10-CM | POA: Insufficient documentation

## 2019-02-20 DIAGNOSIS — Z7901 Long term (current) use of anticoagulants: Secondary | ICD-10-CM | POA: Diagnosis not present

## 2019-02-20 DIAGNOSIS — I25708 Atherosclerosis of coronary artery bypass graft(s), unspecified, with other forms of angina pectoris: Secondary | ICD-10-CM

## 2019-02-20 DIAGNOSIS — I4821 Permanent atrial fibrillation: Secondary | ICD-10-CM | POA: Diagnosis not present

## 2019-02-20 DIAGNOSIS — Z45010 Encounter for checking and testing of cardiac pacemaker pulse generator [battery]: Secondary | ICD-10-CM | POA: Insufficient documentation

## 2019-02-20 DIAGNOSIS — I4892 Unspecified atrial flutter: Secondary | ICD-10-CM | POA: Diagnosis not present

## 2019-02-20 DIAGNOSIS — Z833 Family history of diabetes mellitus: Secondary | ICD-10-CM | POA: Diagnosis not present

## 2019-02-20 DIAGNOSIS — Z87891 Personal history of nicotine dependence: Secondary | ICD-10-CM | POA: Diagnosis not present

## 2019-02-20 DIAGNOSIS — Z7984 Long term (current) use of oral hypoglycemic drugs: Secondary | ICD-10-CM | POA: Insufficient documentation

## 2019-02-20 DIAGNOSIS — I10 Essential (primary) hypertension: Secondary | ICD-10-CM | POA: Diagnosis not present

## 2019-02-20 DIAGNOSIS — Z950 Presence of cardiac pacemaker: Secondary | ICD-10-CM

## 2019-02-20 DIAGNOSIS — I495 Sick sinus syndrome: Secondary | ICD-10-CM | POA: Diagnosis not present

## 2019-02-20 DIAGNOSIS — R55 Syncope and collapse: Secondary | ICD-10-CM | POA: Diagnosis not present

## 2019-02-20 DIAGNOSIS — I25110 Atherosclerotic heart disease of native coronary artery with unstable angina pectoris: Secondary | ICD-10-CM | POA: Diagnosis present

## 2019-02-20 DIAGNOSIS — K219 Gastro-esophageal reflux disease without esophagitis: Secondary | ICD-10-CM | POA: Insufficient documentation

## 2019-02-20 DIAGNOSIS — E119 Type 2 diabetes mellitus without complications: Secondary | ICD-10-CM | POA: Diagnosis not present

## 2019-02-20 DIAGNOSIS — D689 Coagulation defect, unspecified: Secondary | ICD-10-CM | POA: Diagnosis present

## 2019-02-20 HISTORY — PX: LEFT HEART CATH AND CORS/GRAFTS ANGIOGRAPHY: CATH118250

## 2019-02-20 HISTORY — PX: PPM GENERATOR CHANGEOUT: EP1233

## 2019-02-20 LAB — GLUCOSE, CAPILLARY
Glucose-Capillary: 150 mg/dL — ABNORMAL HIGH (ref 70–99)
Glucose-Capillary: 136 mg/dL — ABNORMAL HIGH (ref 70–99)

## 2019-02-20 LAB — SURGICAL PCR SCREEN
MRSA, PCR: NEGATIVE
Staphylococcus aureus: NEGATIVE

## 2019-02-20 LAB — PROTIME-INR
INR: 1.1 (ref 0.8–1.2)
Prothrombin Time: 14.3 seconds (ref 11.4–15.2)

## 2019-02-20 SURGERY — PPM GENERATOR CHANGEOUT

## 2019-02-20 SURGERY — LEFT HEART CATH AND CORS/GRAFTS ANGIOGRAPHY
Anesthesia: LOCAL

## 2019-02-20 MED ORDER — ACETAMINOPHEN 325 MG PO TABS: 325.0000 mg | ORAL_TABLET | ORAL | Status: DC | PRN

## 2019-02-20 MED ORDER — ASPIRIN 81 MG PO CHEW: 81.0000 mg | CHEWABLE_TABLET | ORAL | Status: DC

## 2019-02-20 MED ORDER — LIDOCAINE HCL (PF) 1 % IJ SOLN
INTRAMUSCULAR | Status: AC
  Filled 2019-02-20: qty 30

## 2019-02-20 MED ORDER — HEPARIN (PORCINE) IN NACL 1000-0.9 UT/500ML-% IV SOLN
INTRAVENOUS | Status: AC
  Filled 2019-02-20: qty 1000

## 2019-02-20 MED ORDER — FENTANYL CITRATE (PF) 100 MCG/2ML IJ SOLN
INTRAMUSCULAR | Status: DC | PRN
  Administered 2019-02-20: 25 ug via INTRAVENOUS

## 2019-02-20 MED ORDER — HYDRALAZINE HCL 20 MG/ML IJ SOLN: 10.0000 mg | INTRAMUSCULAR | Status: DC | PRN

## 2019-02-20 MED ORDER — LABETALOL HCL 5 MG/ML IV SOLN: 10.0000 mg | INTRAVENOUS | Status: DC | PRN

## 2019-02-20 MED ORDER — SODIUM CHLORIDE 0.9 % IV SOLN: INTRAVENOUS | Status: DC

## 2019-02-20 MED ORDER — CEFAZOLIN SODIUM-DEXTROSE 2-4 GM/100ML-% IV SOLN
2.0000 g | INTRAVENOUS | Status: AC
  Administered 2019-02-20: 2 g via INTRAVENOUS

## 2019-02-20 MED ORDER — SODIUM CHLORIDE 0.9 % IV SOLN: 250.0000 mL | INTRAVENOUS | Status: DC | PRN

## 2019-02-20 MED ORDER — MIDAZOLAM HCL 2 MG/2ML IJ SOLN
INTRAMUSCULAR | Status: AC
  Filled 2019-02-20: qty 2

## 2019-02-20 MED ORDER — SODIUM CHLORIDE 0.9 % WEIGHT BASED INFUSION: 0.7500 mL/kg/h | INTRAVENOUS | Status: DC

## 2019-02-20 MED ORDER — CEFAZOLIN SODIUM-DEXTROSE 2-4 GM/100ML-% IV SOLN
INTRAVENOUS | Status: AC
  Filled 2019-02-20: qty 100

## 2019-02-20 MED ORDER — FENTANYL CITRATE (PF) 100 MCG/2ML IJ SOLN
INTRAMUSCULAR | Status: AC
  Filled 2019-02-20: qty 2

## 2019-02-20 MED ORDER — MUPIROCIN 2 % EX OINT
TOPICAL_OINTMENT | CUTANEOUS | Status: AC
  Administered 2019-02-20: 08:00:00
  Filled 2019-02-20: qty 22

## 2019-02-20 MED ORDER — SODIUM CHLORIDE 0.9% FLUSH: 3.0000 mL | Freq: Two times a day (BID) | INTRAVENOUS | Status: DC

## 2019-02-20 MED ORDER — LIDOCAINE HCL (PF) 1 % IJ SOLN
INTRAMUSCULAR | Status: DC | PRN
  Administered 2019-02-20: 20 mL

## 2019-02-20 MED ORDER — ONDANSETRON HCL 4 MG/2ML IJ SOLN: 4.0000 mg | Freq: Four times a day (QID) | INTRAMUSCULAR | Status: DC | PRN

## 2019-02-20 MED ORDER — HEPARIN (PORCINE) IN NACL 1000-0.9 UT/500ML-% IV SOLN
INTRAVENOUS | Status: DC | PRN
  Administered 2019-02-20 (×2): 500 mL

## 2019-02-20 MED ORDER — SODIUM CHLORIDE 0.9 % WEIGHT BASED INFUSION
1.0000 mL/kg/h | INTRAVENOUS | Status: DC
  Administered 2019-02-20: 1 mL/kg/h via INTRAVENOUS

## 2019-02-20 MED ORDER — SODIUM CHLORIDE 0.9 % WEIGHT BASED INFUSION
3.0000 mL/kg/h | INTRAVENOUS | Status: AC
  Administered 2019-02-20: 3 mL/kg/h via INTRAVENOUS

## 2019-02-20 MED ORDER — FENTANYL CITRATE (PF) 100 MCG/2ML IJ SOLN
INTRAMUSCULAR | Status: DC | PRN
  Administered 2019-02-20 (×2): 25 ug via INTRAVENOUS

## 2019-02-20 MED ORDER — SODIUM CHLORIDE 0.9 % IV SOLN
80.0000 mg | INTRAVENOUS | Status: AC
  Administered 2019-02-20: 80 mg

## 2019-02-20 MED ORDER — MIDAZOLAM HCL 5 MG/5ML IJ SOLN
INTRAMUSCULAR | Status: AC
  Filled 2019-02-20: qty 5

## 2019-02-20 MED ORDER — MIDAZOLAM HCL 5 MG/5ML IJ SOLN
INTRAMUSCULAR | Status: DC | PRN
  Administered 2019-02-20: 1 mg via INTRAVENOUS

## 2019-02-20 MED ORDER — CHLORHEXIDINE GLUCONATE 4 % EX LIQD
60.0000 mL | Freq: Once | CUTANEOUS | Status: DC
  Filled 2019-02-20: qty 60

## 2019-02-20 MED ORDER — ACETAMINOPHEN 325 MG PO TABS: 650.0000 mg | ORAL_TABLET | ORAL | Status: DC | PRN

## 2019-02-20 MED ORDER — IOHEXOL 350 MG/ML SOLN
INTRAVENOUS | Status: DC | PRN
  Administered 2019-02-20: 155 mL via INTRACARDIAC

## 2019-02-20 MED ORDER — SODIUM CHLORIDE 0.9 % IV SOLN
INTRAVENOUS | Status: AC
  Filled 2019-02-20: qty 2

## 2019-02-20 MED ORDER — SODIUM CHLORIDE 0.9% FLUSH: 3.0000 mL | INTRAVENOUS | Status: DC | PRN

## 2019-02-20 MED ORDER — LIDOCAINE HCL (PF) 1 % IJ SOLN
INTRAMUSCULAR | Status: DC | PRN
  Administered 2019-02-20: 45 mL

## 2019-02-20 MED ORDER — MIDAZOLAM HCL 2 MG/2ML IJ SOLN
INTRAMUSCULAR | Status: DC | PRN
  Administered 2019-02-20 (×2): 1 mg via INTRAVENOUS

## 2019-02-20 MED ORDER — LIDOCAINE HCL (PF) 1 % IJ SOLN
INTRAMUSCULAR | Status: AC
  Filled 2019-02-20: qty 60

## 2019-02-20 SURGICAL SUPPLY — 5 items
CABLE SURGICAL S-101-97-12 (CABLE) ×3 IMPLANT
IPG PACE AZUR XT DR MRI W1DR01 (Pacemaker) IMPLANT
PACE AZURE XT DR MRI W1DR01 (Pacemaker) ×3 IMPLANT
PAD PRO RADIOLUCENT 2001M-C (PAD) ×3 IMPLANT
TRAY PACEMAKER INSERTION (PACKS) ×3 IMPLANT

## 2019-02-20 SURGICAL SUPPLY — 11 items
CATH INFINITI 5 FR IM (CATHETERS) ×1 IMPLANT
CATH INFINITI 5FR JL4 (CATHETERS) ×1 IMPLANT
CATH INFINITI 5FR MPB2 (CATHETERS) ×1 IMPLANT
COVER DOME SNAP 22 D (MISCELLANEOUS) ×1 IMPLANT
KIT HEART LEFT (KITS) ×2 IMPLANT
PACK CARDIAC CATHETERIZATION (CUSTOM PROCEDURE TRAY) ×2 IMPLANT
SHEATH PINNACLE 5F 10CM (SHEATH) ×1 IMPLANT
SHEATH PROBE COVER 6X72 (BAG) ×1 IMPLANT
TRANSDUCER W/STOPCOCK (MISCELLANEOUS) ×2 IMPLANT
TUBING CIL FLEX 10 FLL-RA (TUBING) ×2 IMPLANT
WIRE EMERALD 3MM-J .035X150CM (WIRE) ×1 IMPLANT

## 2019-02-20 NOTE — Interval H&P Note (Signed)
Cath Lab Visit (complete for each Cath Lab visit)  Clinical Evaluation Leading to the Procedure:   ACS: No.  Non-ACS:    Anginal Classification: CCS Porter  Anti-ischemic medical therapy: Maximal Therapy (2 or more classes of medications)  Non-Invasive Test Results: No non-invasive testing performed  Prior CABG: Previous CABG      History and Physical Interval Note:  02/20/2019 8:32 AM  Lucas Porter  has presented today for surgery, with the diagnosis of angina.  The various methods of treatment have been discussed with the patient and family. After consideration of risks, benefits and other options for treatment, the patient has consented to  Procedure(s): LEFT HEART CATH AND CORS/GRAFTS ANGIOGRAPHY (N/A) as a surgical intervention.  The patient's history has been reviewed, patient examined, no change in status, stable for surgery.  I have reviewed the patient's chart and labs.  Questions were answered to the patient's satisfaction.     Lucas Porter

## 2019-02-20 NOTE — Discharge Instructions (Signed)
Supplemental Discharge Instructions for  Pacemaker/Defibrillator Patients  Activity No restrictions except as listed in wound care. DO wear your seatbelt, even if it crosses over the pacemaker site.  WOUND CARE - Keep the wound area clean and dry.  Remove the dressing the day after you return home (usually 48 hours after the procedure). - DO NOT SUBMERGE UNDER WATER UNTIL FULLY HEALED (no tub baths, hot tubs, swimming pools, etc.).  - You  may shower or take a sponge bath after the dressing is removed. DO NOT SOAK the area and do not allow the shower to directly spray on the site. - If you have staples, these will be removed in the office in 7-14 days. - If you have tape/steri-strips on your wound, these will fall off; do not pull them off prematurely.   - No bandage is needed on the site.  DO  NOT apply any creams, oils, or ointments to the wound area. - If you notice any drainage or discharge from the wound, any swelling, excessive redness or bruising at the site, or if you develop a fever > 101? F after you are discharged home, call the office at once.  Special Instructions - You are still able to use cellular telephones.  Avoid carrying your cellular phone near your device. - When traveling through airports, show security personnel your identification card to avoid being screened in the metal detectors.  - Avoid arc welding equipment, MRI testing (magnetic resonance imaging), TENS units (transcutaneous nerve stimulators).  Call the office for questions about other devices. - Avoid electrical appliances that are in poor condition or are not properly grounded. - Microwave ovens are safe to be near or to operate.  Angiogram, Care After This sheet gives you information about how to care for yourself after your procedure. Your doctor may also give you more specific instructions. If you have problems or questions, contact your doctor. Follow these instructions at home: Insertion site  care Follow instructions from your doctor about how to take care of your long, thin tube (catheter) insertion area. Make sure you: Wash your hands with soap and water before you change your bandage (dressing). If you cannot use soap and water, use hand sanitizer. Change your bandage as told by your doctor. Leave stitches (sutures), skin glue, or skin tape (adhesive) strips in place. They may need to stay in place for 2 weeks or longer. If tape strips get loose and curl up, you may trim the loose edges. Do not remove tape strips completely unless your doctor says it is okay. Do not take baths, swim, or use a hot tub until your doctor says it is okay. You may shower 24-48 hours after the procedure or as told by your doctor. Gently wash the area with plain soap and water. Pat the area dry with a clean towel. Do not rub the area. This may cause bleeding. Do not apply powder or lotion to the area. Keep the area clean and dry. Check your insertion area every day for signs of infection. Check for: More redness, swelling, or pain. Fluid or blood. Warmth. Pus or a bad smell. Activity Rest as told by your doctor, usually for 1-2 days. Do not lift anything that is heavier than 10 lbs. (4.5 kg) or as told by your doctor. Do not drive for 24 hours if you were given a medicine to help you relax (sedative). Do not drive or use heavy machinery while taking prescription pain medicine. General instructions  Go  back to your normal activities as told by your doctor, usually in about a week. Ask your doctor what activities are safe for you. If the insertion area starts to bleed, lie flat and put pressure on the area. If the bleeding does not stop, get help right away. This is an emergency. Drink enough fluid to keep your pee (urine) clear or pale yellow. Take over-the-counter and prescription medicines only as told by your doctor. Keep all follow-up visits as told by your doctor. This is important. Contact a  doctor if: You have a fever. You have chills. You have more redness, swelling, or pain around your insertion area. You have fluid or blood coming from your insertion area. The insertion area feels warm to the touch. You have pus or a bad smell coming from your insertion area. You have more bruising around the insertion area. Blood collects in the tissue around the insertion area (hematoma) that may be painful to the touch. Get help right away if: You have a lot of pain in the insertion area. The insertion area swells very fast. The insertion area is bleeding, and the bleeding does not stop after holding steady pressure on the area. The area near or just beyond the insertion area becomes pale, cool, tingly, or numb. These symptoms may be an emergency. Do not wait to see if the symptoms will go away. Get medical help right away. Call your local emergency services (911 in the [place] Do not drive yourself to the hospital. Summary After the procedure, it is common to have bruising and tenderness at the long, thin tube insertion area. After the procedure, it is important to rest and drink plenty of fluids. Do not take baths, swim, or use a hot tub until your doctor says it is okay to do so. You may shower 24-48 hours after the procedure or as told by your doctor. If the insertion area starts to bleed, lie flat and put pressure on the area. If the bleeding does not stop, get help right away. This is an emergency. This information is not intended to replace advice given to you by your health care provider. Make sure you discuss any questions you have with your health care provider. Document Released: 11/30/2008 Document Revised: 08/28/2016 Document Reviewed: 08/28/2016 Elsevier Interactive Patient Education  2019 [employer]. -

## 2019-02-20 NOTE — CV Procedure (Signed)
   Left heart cath, bypass graft angiography, and left ventriculography via right femoral using real-time vascular ultrasound guidance.  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  The diagonal is large.  SVG to ramus intermedius eccentric 30 to 40% proximal stenosis  Native vessels demonstrated ostial occlusion of LAD  Left main to circumflex stent contains mid eccentric in-stent restenosis less than 40%.  Beyond the stent there is 50%, new circumflex stenosis.  The small second obtuse marginal has new 90% obstruction.  The distal circumflex contains segmental angulated 80% stenosis.  The native right coronary is totally occluded in the midsegment which represents progression compared to prior.  Dyssynergy of left ventricular function with EF estimated 40 to 50%.  Elevated LVEDP consistent with diastolic heart failure.  Since the patient has improved anginal great on medical therapy, will recommend further up titration of medical therapy if possible.  If angina becomes on manageable would recommend stent SVG to diagonal, and potentially could consider the distal circumflex although it would be higher than usual risk because of the distal location and segmental nature of the lesion.

## 2019-02-20 NOTE — Progress Notes (Signed)
Pt extremely afraid of blood and needles. Rennis Harding, Rn called and states she will get him something for his nerves.

## 2019-02-20 NOTE — Progress Notes (Addendum)
Site area: Right groin a 5 french arterial sheath was removed by Fuller Canada RN  Site Prior to Removal:  Level 0  Pressure Applied For 20 MINUTES    Bedrest Beginning at 1110am  Manual:   Yes.    Patient Status During Pull:  stable  Post Pull Groin Site:  Level 0  Post Pull Instructions Given:  Yes.    Post Pull Pulses Present:  Yes.    Dressing Applied:  Yes.    Comments:  VS remain stable

## 2019-02-20 NOTE — Op Note (Signed)
Procedure report  Procedure performed:  1. Dual chamber pacemaker generator changeout  2. Light sedation  Reason for procedure:  1. Device generator at elective replacement interval  2. Tachy-brady syndrome Procedure performed by:  [name], MD  Complications:  None  Estimated blood loss:  <5 mL  Medications administered during procedure:  Ancef 2 g intravenously, lidocaine 1% 30 mL locally, fentanyl 25 mcg intravenously, Versed 1 mg intravenously During this procedure the patient is administered a total of Versed 1 mg and Fentanyl 25 mcg to achieve and maintain moderate conscious sedation.  The patient's heart rate, blood pressure, and oxygen saturation are monitored continuously during the procedure. The period of conscious sedation is 24 minutes, of which I was present face-to-face 100% of this time.  Device details:   New Generator Medtronic Azure XT model number [id number], serial number [id number] Right atrial lead (chronic) Medtronic, model number [id number], serial [id number] (implanted 11/15/2010) Right ventricular lead (chronic)  Medtronic, model number [id number], serial number [id number] (implanted 11/15/2010)  Explanted generator Medtronic Revo,  model number [id number], serial number  [id number] (implanted 11/15/2010)  Procedure details:  After the risks and benefits of the procedure were discussed the patient provided informed consent. She was brought to the cardiac catheter lab in the fasting state. The patient was prepped and draped in usual sterile fashion. Local anesthesia with 1% lidocaine was administered to to the left infraclavicular area. A 5-6cm horizontal incision was made parallel with and 2-3 cm caudal to the left clavicle, in the area of an old scar. An older scar was seen closer to the left clavicle. Using minimal electrocautery and mostly sharp and blunt dissection the prepectoral pocket was opened carefully to avoid injury to the loops of chronic leads.  Extensive dissection was not necessary. The device was explanted. The pocket was carefully inspected for hemostasis and flushed with copious amounts of antibiotic solution.  The leads were disconnected from the old generator and testing of the lead parameters later showed excellent values. The new generator was connected to the chronic leads, with appropriate pacing noted.   The entire system was then carefully inserted in the pocket with care been taking that the leads and device assumed a comfortable position without pressure on the incision. Great care was taken that the leads be located deep to the generator. The pocket was then closed in layers using 2 layers of 2-0 Vicryl and cutaneous staples after which a sterile dressing was applied.   At the end of the procedure the following lead parameters were encountered:   Right atrial lead sensed P waves 1.5 mV (afib), impedance 475 ohms, threshold not tested  Right ventricular lead sensed R waves  >20 mV mV, impedance 551 ohms, threshold 0.75 at 0.4 ms pulse width.

## 2019-02-20 NOTE — Psychosocial Assessment (Signed)
Pt  To EP lab for procedure via stretcher.

## 2019-02-23 ENCOUNTER — Encounter (HOSPITAL_COMMUNITY): Payer: Self-pay | Admitting: Cardiovascular Disease

## 2019-02-23 ENCOUNTER — Telehealth: Payer: Self-pay | Admitting: Cardiovascular Disease

## 2019-02-23 MED FILL — Gentamicin Sulfate Inj 40 MG/ML: INTRAMUSCULAR | Qty: 80 | Status: AC

## 2019-02-23 MED FILL — Cefazolin Sodium-Dextrose IV Solution 2 GM/100ML-4%: INTRAVENOUS | Qty: 100 | Status: AC

## 2019-02-23 NOTE — Telephone Encounter (Signed)
Called, LVM on wife phone as instructed.  Left call back number if questions.

## 2019-02-23 NOTE — Telephone Encounter (Signed)
Called patient, spoke with wife she states at last visit- they were told they could stop Tikosyn, I advised I would route a message to MD to provide confirmation.  Also patient wife wants to know if patient has chest pain after the procedure if it is okay for him to take his Nitroglycerin.   Will route to MD and nurse.

## 2019-02-23 NOTE — Telephone Encounter (Signed)
New Message    Pt c/o medication issue:  1. Name of Medication: Dofetilide 500mg    2. How are you currently taking this medication (dosage and times per day)? 1 capsule twice a day   3. Are you having a reaction (difficulty breathing--STAT)?  4. What is your medication issue? Patient wants to know if they should keep taking medication or stop it now.

## 2019-02-23 NOTE — Telephone Encounter (Signed)
OK to take NTG. I was waiting to hear from Dr. Rayann Heman about his opinion on the [place]. MCr

## 2019-02-27 ENCOUNTER — Telehealth: Payer: Self-pay

## 2019-02-27 NOTE — Telephone Encounter (Signed)
LMOVM      COVID-19 Pre-Screening Questions:  . In the past 7 to 10 days have you had a cough,  shortness of breath, headache, congestion, fever (100 or greater) body aches, chills, sore throat, or sudden loss of taste or sense of smell? . Have you been around anyone with known Covid 19. . Have you been around anyone who is awaiting Covid 19 test results in the past 7 to 10 days? . Have you been around anyone who has been exposed to Covid 19, or has mentioned symptoms of Covid 19 within the past 7 to 10 days?  If you have any concerns/questions about symptoms patients report during screening (either on the phone or at threshold). Contact the provider seeing the patient or DOD for further guidance.  If neither are available contact a member of the leadership team.            

## 2019-03-02 ENCOUNTER — Telehealth: Payer: Self-pay | Admitting: Cardiovascular Disease

## 2019-03-02 ENCOUNTER — Telehealth: Payer: Self-pay

## 2019-03-02 NOTE — Telephone Encounter (Signed)
    COVID-19 Pre-Screening Questions:  . In the past 7 to 10 days have you had a cough,  shortness of breath, headache, congestion, fever (100 or greater) body aches, chills, sore throat, or sudden loss of taste or sense of smell? . Have you been around anyone with known Covid 19. . Have you been around anyone who is awaiting Covid 19 test results in the past 7 to 10 days? . Have you been around anyone who has been exposed to Covid 19, or has mentioned symptoms of Covid 19 within the past 7 to 10 days?  If you have any concerns/questions about symptoms patients report during screening (either on the phone or at threshold). Contact the provider seeing the patient or DOD for further guidance.  If neither are available contact a member of the leadership team.           Pt wife answered no to all questions for the pt. I informed pt wife that we are limiting the amount of people coming into the office. If the pt can come by himself we would appreciate it however, if the pt needs help whomever comes with the pt would need a mask on. The pt also would need to wear a mask if he has one. I told the pt wife if anything changes between now and the visit to call and let us know. The pt wife verbalized understanding.

## 2019-03-02 NOTE — Telephone Encounter (Signed)

## 2019-03-02 NOTE — Telephone Encounter (Signed)
lmom for prescreen  

## 2019-03-02 NOTE — Telephone Encounter (Signed)
Lmtcb.  Patient needs 91 day follow up with Dr. Loletha Grayer - about September 6 or mid September. Please schedule.

## 2019-03-03 ENCOUNTER — Ambulatory Visit (INDEPENDENT_AMBULATORY_CARE_PROVIDER_SITE_OTHER): Payer: BC Managed Care – PPO | Admitting: *Deleted

## 2019-03-03 ENCOUNTER — Ambulatory Visit: Payer: Medicare HMO

## 2019-03-03 ENCOUNTER — Other Ambulatory Visit: Payer: Self-pay

## 2019-03-03 DIAGNOSIS — Z950 Presence of cardiac pacemaker: Secondary | ICD-10-CM | POA: Diagnosis not present

## 2019-03-03 DIAGNOSIS — I4819 Other persistent atrial fibrillation: Secondary | ICD-10-CM

## 2019-03-03 DIAGNOSIS — R001 Bradycardia, unspecified: Secondary | ICD-10-CM | POA: Diagnosis not present

## 2019-03-03 LAB — CUP PACEART INCLINIC DEVICE CHECK
Battery Remaining Longevity: 154 mo
Battery Voltage: 3.19 V
Brady Statistic AP VP Percent: 63.4 %
Brady Statistic AP VS Percent: 0.1 %
Brady Statistic AS VP Percent: 33.04 %
Brady Statistic AS VS Percent: 3.52 %
Brady Statistic RA Percent Paced: 20.25 %
Brady Statistic RV Percent Paced: 70.91 %
Date Time Interrogation Session: 20200616162547
Implantable Lead Implant Date: 20120229
Implantable Lead Implant Date: 20120229
Implantable Lead Location: 753859
Implantable Lead Location: 753860
Implantable Pulse Generator Implant Date: 20200605
Lead Channel Impedance Value: 323 Ohm
Lead Channel Impedance Value: 456 Ohm
Lead Channel Impedance Value: 532 Ohm
Lead Channel Impedance Value: 589 Ohm
Lead Channel Pacing Threshold Amplitude: 0.75 V
Lead Channel Pacing Threshold Pulse Width: 0.4 ms
Lead Channel Sensing Intrinsic Amplitude: 1.25 mV
Lead Channel Sensing Intrinsic Amplitude: 22.625 mV
Lead Channel Setting Pacing Amplitude: 1.75 V
Lead Channel Setting Pacing Amplitude: 2.5 V
Lead Channel Setting Pacing Pulse Width: 0.4 ms
Lead Channel Setting Sensing Sensitivity: 2 mV

## 2019-03-03 NOTE — Progress Notes (Signed)
Wound check appointment. Steri-strips removed. Wound without redness or edema. Incision edges approximated, wound healing well. Normal device function. Thresholds, sensing, and impedances consistent with implant measurements. Device programmed at chronic outputs s/p generator replacement. Histogram distribution appropriate for patient and level of activity. 69.2% AT/AF burden, +warfarin and Tikosyn. No high ventricular rates noted. RA/RV max impedances decreased to 2000ohms. Patient educated about wound care, arm mobility, and Carelink monitor. ROV with [place] on 06/10/19.

## 2019-03-06 ENCOUNTER — Telehealth: Payer: Self-pay | Admitting: *Deleted

## 2019-03-06 ENCOUNTER — Ambulatory Visit (INDEPENDENT_AMBULATORY_CARE_PROVIDER_SITE_OTHER): Payer: BC Managed Care – PPO | Admitting: Pharmacist

## 2019-03-06 ENCOUNTER — Other Ambulatory Visit: Payer: Self-pay

## 2019-03-06 DIAGNOSIS — I480 Paroxysmal atrial fibrillation: Secondary | ICD-10-CM | POA: Diagnosis not present

## 2019-03-06 DIAGNOSIS — Z7901 Long term (current) use of anticoagulants: Secondary | ICD-10-CM | POA: Diagnosis not present

## 2019-03-06 LAB — POCT INR: INR: 2.9 (ref 2.0–3.0)

## 2019-03-06 NOTE — Telephone Encounter (Signed)
LMOVM requesting that patient send a manual transmission with his Carelink monitor. Need to pair old monitor with pt's new device. [place] phone number for any questions/concerns.

## 2019-03-11 NOTE — Telephone Encounter (Signed)
LMOVM for pt to send a manual transmission with his home monitor. I left my direct office number for him to call if he needs help. This is the 3rd attempt to contact the pt. I am going to send him a letter with instructions.

## 2019-03-12 NOTE — Telephone Encounter (Signed)
LMOVM advising transmission was received. [place] phone number for any questions/concerns.

## 2019-03-25 ENCOUNTER — Emergency Department (HOSPITAL_COMMUNITY): Payer: BC Managed Care – PPO

## 2019-03-25 ENCOUNTER — Encounter (HOSPITAL_COMMUNITY): Payer: Self-pay | Admitting: Emergency Medicine

## 2019-03-25 ENCOUNTER — Emergency Department (HOSPITAL_COMMUNITY)
Admission: EM | Admit: 2019-03-25 | Discharge: 2019-03-25 | Disposition: A | Discharge status: Home / Self Care | Payer: BC Managed Care – PPO | Attending: Emergency Medicine | Admitting: Emergency Medicine

## 2019-03-25 ENCOUNTER — Other Ambulatory Visit: Payer: Self-pay

## 2019-03-25 DIAGNOSIS — Z951 Presence of aortocoronary bypass graft: Secondary | ICD-10-CM | POA: Diagnosis not present

## 2019-03-25 DIAGNOSIS — Z7984 Long term (current) use of oral hypoglycemic drugs: Secondary | ICD-10-CM | POA: Insufficient documentation

## 2019-03-25 DIAGNOSIS — Z7901 Long term (current) use of anticoagulants: Secondary | ICD-10-CM | POA: Insufficient documentation

## 2019-03-25 DIAGNOSIS — R079 Chest pain, unspecified: Secondary | ICD-10-CM | POA: Diagnosis present

## 2019-03-25 DIAGNOSIS — R0789 Other chest pain: Secondary | ICD-10-CM | POA: Diagnosis not present

## 2019-03-25 DIAGNOSIS — Z955 Presence of coronary angioplasty implant and graft: Secondary | ICD-10-CM | POA: Insufficient documentation

## 2019-03-25 DIAGNOSIS — I10 Essential (primary) hypertension: Secondary | ICD-10-CM | POA: Insufficient documentation

## 2019-03-25 DIAGNOSIS — Z950 Presence of cardiac pacemaker: Secondary | ICD-10-CM | POA: Insufficient documentation

## 2019-03-25 DIAGNOSIS — Z79899 Other long term (current) drug therapy: Secondary | ICD-10-CM | POA: Diagnosis not present

## 2019-03-25 DIAGNOSIS — E119 Type 2 diabetes mellitus without complications: Secondary | ICD-10-CM | POA: Diagnosis not present

## 2019-03-25 DIAGNOSIS — I2510 Atherosclerotic heart disease of native coronary artery without angina pectoris: Secondary | ICD-10-CM | POA: Diagnosis not present

## 2019-03-25 LAB — COMPREHENSIVE METABOLIC PANEL
ALT: 22 U/L (ref 0–44)
AST: 25 U/L (ref 15–41)
Albumin: 4.2 g/dL (ref 3.5–5.0)
Alkaline Phosphatase: 72 U/L (ref 38–126)
Anion gap: 11 (ref 5–15)
BUN: 13 mg/dL (ref 8–23)
CO2: 23 mmol/L (ref 22–32)
Calcium: 9.6 mg/dL (ref 8.9–10.3)
Chloride: 104 mmol/L (ref 98–111)
Creatinine, Ser: 0.93 mg/dL (ref 0.61–1.24)
GFR calc Af Amer: >60 mL/min (ref >60)
GFR calc non Af Amer: >60 mL/min (ref >60)
Glucose, Bld: 166 mg/dL — ABNORMAL HIGH (ref 70–99)
Potassium: 3.9 mmol/L (ref 3.5–5.1)
Sodium: 138 mmol/L (ref 135–145)
Total Bilirubin: 0.8 mg/dL (ref 0.3–1.2)
Total Protein: 7 g/dL (ref 6.5–8.1)

## 2019-03-25 LAB — URINALYSIS, ROUTINE W REFLEX MICROSCOPIC
Bacteria, UA: NONE SEEN
Bilirubin Urine: NEGATIVE
Glucose, UA: NEGATIVE mg/dL
Ketones, ur: NEGATIVE mg/dL
Leukocytes,Ua: NEGATIVE
Nitrite: NEGATIVE
Protein, ur: 30 mg/dL — AB
RBC / HPF: >50 RBC/hpf — ABNORMAL HIGH (ref 0–5)
Specific Gravity, Urine: 1.024 (ref 1.005–1.030)
pH: 5 (ref 5.0–8.0)

## 2019-03-25 LAB — PROTIME-INR
INR: 2.6 — ABNORMAL HIGH (ref 0.8–1.2)
Prothrombin Time: 27.6 seconds — ABNORMAL HIGH (ref 11.4–15.2)

## 2019-03-25 LAB — CBC WITH DIFFERENTIAL/PLATELET
Abs Immature Granulocytes: 0.02 10*3/uL (ref 0.00–0.07)
Basophils Absolute: 0 10*3/uL (ref 0.0–0.1)
Basophils Relative: 1 %
Eosinophils Absolute: 0.2 10*3/uL (ref 0.0–0.5)
Eosinophils Relative: 3 %
HCT: 44.1 % (ref 39.0–52.0)
Hemoglobin: 14.9 g/dL (ref 13.0–17.0)
Immature Granulocytes: 0 %
Lymphocytes Relative: 23 %
Lymphs Abs: 1.8 10*3/uL (ref 0.7–4.0)
MCH: 28.4 pg (ref 26.0–34.0)
MCHC: 33.8 g/dL (ref 30.0–36.0)
MCV: 84 fL (ref 80.0–100.0)
Monocytes Absolute: 0.6 10*3/uL (ref 0.1–1.0)
Monocytes Relative: 7 %
Neutro Abs: 5.3 10*3/uL (ref 1.7–7.7)
Neutrophils Relative %: 66 %
Platelets: 193 10*3/uL (ref 150–400)
RBC: 5.25 MIL/uL (ref 4.22–5.81)
RDW: 13.2 % (ref 11.5–15.5)
WBC: 7.9 10*3/uL (ref 4.0–10.5)
nRBC: 0 % (ref 0.0–0.2)

## 2019-03-25 LAB — TROPONIN I (HIGH SENSITIVITY)
Troponin I (High Sensitivity): 45 ng/L — ABNORMAL HIGH (ref <18)
Troponin I (High Sensitivity): 44 ng/L — ABNORMAL HIGH (ref <18)

## 2019-03-25 MED ORDER — NITROGLYCERIN 0.4 MG SL SUBL: 0.4000 mg | SUBLINGUAL_TABLET | SUBLINGUAL | 0 refills | Status: DC | PRN

## 2019-03-25 NOTE — ED Notes (Signed)
Patient verbalizes understanding of discharge instructions. Opportunity for questioning and answers were provided. Armband removed by staff, pt discharged from ED.  

## 2019-03-25 NOTE — ED Triage Notes (Signed)
Pt arrives to ED from home with complaints of sudden sharp centralized chest paion starting at 3pm. EMS reports pt took his own expired nitro x3 without relief then EMS gave x2 nitro with resolution of chest pain. Pt was in Afib with EMS with HR at 90-110.  Pt has his pacemaker battery changed x3 days ago.

## 2019-03-25 NOTE — ED Notes (Signed)
Called lab to add on trop 

## 2019-03-25 NOTE — ED Notes (Signed)
Lucas Porter [facility]) [place] (wife) : [phone]

## 2019-03-25 NOTE — Discharge Instructions (Addendum)
Please switch your IMDUR 30 mg daily to 60 mg daily. Please take two tablets at the same time for a total to 60 mg of IMDUR daily.  Please call your cardiologist, you should have an appointment with him within 1 to 2 weeks for appropriate follow-up.  If you experience any shortness of breath, worsening symptoms please return to the emergency department.

## 2019-03-25 NOTE — ED Notes (Signed)
Patient transported to X-ray 

## 2019-03-25 NOTE — ED Notes (Signed)
Pt aware of the need for urine 

## 2019-03-25 NOTE — ED Provider Notes (Signed)
[facility] EMERGENCY DEPARTMENT Provider Note   CSN: [id number] Arrival date & time: 03/25/19  1828    History   Chief Complaint Chief Complaint  Patient presents with  . Chest Pain    HPI Lucas Porter is a 63 y.o. male.     63 y.o male with an extensive PMH including A. fib, CAD, a placed pacemaker about a month ago presents to the ED with complaints of chest pain.  Patient reports he was at home when he began to experience his tightness to the center of his chest, this did not radiate and felt more like pressure "like a Hanus pushing down on my chest ", he took 3 nitroglycerin which he had at home, according to patient these were likely expired.  He then received 2 nitros by EMS, this brought his pain from an 8 to a 1.  He reports mowing the lawn yesterday along with walking uphill, began to feel short of breath along with have the worsening pain in his chest.  He reports the chest pain is worse with exertion, alleviated by nitro.  Patient recently had his defibrillator replaced, does have a previous history of A. fib on anticoagulation medication.  He denies any fevers, cough, swelling to his legs. Of note patient has a quadruple bypass history about 10 years ago.  The history is provided by the patient.  Chest Pain Associated symptoms: shortness of breath   Associated symptoms: no abdominal pain, no back pain, no cough, no fever, no palpitations and no vomiting     Past Medical History:  Diagnosis Date  . Anxiety   . Atrial fibrillation (HCC)    paroxysmal  . Atrial flutter ([place])   . Coagulopathy ([place])    persistent elevation of ACT during 10/17/11 hospitalization  . Coronary artery disease    s/p CABG 2010, s/p Successful PCI  10/17/11 of Distal Left Main and the Left Circumflex with a Promus Element DES stent -- 4.0 mm x 16 mm postdilated to 4.12 mm   . DDD (degenerative disc disease)   . Depression   . Dysrhythmia    afib  . Facial asymmetry 2010    pt. stated it was very temporary, never seen by doctor for it.   Marland Kitchen GERD (gastroesophageal reflux disease)   . Heart murmur   . Hyperlipidemia   . Hypertension   . Kidney stones   . Presence of permanent cardiac pacemaker    Medtronic   . S/P coronary artery stent placement to distal Lt. main and Lt. Cx 10/17/11  . 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 Tachycardia-bradycardia syndrome ([place])   . Pacemaker battery depletion   . Memory difficulty 02/04/2018  . Hearing loss 11/05/2017  . Diabetes mellitus type 2, uncomplicated ([place]) 83/38/2505  . Cerebrovascular disease, unspecified 05/22/2017  . Major depressive disorder, recurrent episode, mild ([place]) 05/07/2017  . Morbid obesity ([place]) 10/12/2015  . 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 Symptomatic bradycardia 03/09/2013  . Long term (current) use of anticoagulants 12/03/2012  . BMI 40.0-44.9, adult ([place]) 05/13/2012  . Atrial flutter ([place]) 04/24/2012  . S/P coronary artery stent placement to distal Lt. main and Lt. Cx, 10/17/11 10/19/2011  . Coronary artery disease involving native coronary artery of native heart with unstable angina pectoris ([place]) 10/19/2011  . Coagulopathy ([place]) 10/19/2011  . History of pacemaker, with a-v pacing 10/19/2011  .  CAD (coronary artery disease) with CABG 2011, LIMA to LAD, Free radial to Lt. PDA, VG to Diag and ramus intermedius 10/02/2011  . Anxiety 10/02/2011  . Paroxysmal atrial fibrillation ([place]) 10/02/2011  . Hypertension 10/02/2011  . Dyslipidemia 10/02/2011  . 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 ATRIAL FIBRILLATION ABLATION  04/24/12, 12/02/12   PVI x2 and CTI ablation by Dr Rayann Heman  . ATRIAL FIBRILLATION ABLATION N/A 04/24/2012   Procedure: ATRIAL FIBRILLATION ABLATION;  Surgeon: Thompson Grayer, MD;  Location: [facility] CATH LAB;  Service: Cardiovascular;   Laterality: N/A;  . ATRIAL FIBRILLATION ABLATION N/A 12/02/2012   Procedure: ATRIAL FIBRILLATION ABLATION;  Surgeon: Thompson Grayer, MD;  Location: [facility] CATH LAB;  Service: Cardiovascular;  Laterality: N/A;  . CORONARY ANGIOPLASTY  10/19/11  . CORONARY ARTERY BYPASS GRAFT  2010  . CYSTOSCOPY  2007 ?   treatment for Kidney stones   . INGUINAL HERNIA REPAIR Left 01/04/2016   Procedure: LAPAROSCOPIC LEFT INGUINAL HERNIA WITH MESH;  Surgeon: Ralene Ok, MD;  Location: [facility];  Service: General;  Laterality: Left;  . INSERT / REPLACE / REMOVE PACEMAKER  2/12   Medtronic  . INSERTION OF MESH Left 01/04/2016   Procedure: INSERTION OF MESH;  Surgeon: Ralene Ok, MD;  Location: [facility];  Service: General;  Laterality: Left;  . LAPAROSCOPIC LYSIS OF ADHESIONS Left 01/04/2016   Procedure: LAPAROSCOPIC LYSIS OF ADHESIONS;  Surgeon: Ralene Ok, MD;  Location: [facility];  Service: General;  Laterality: Left;  . LEFT HEART CATH AND CORS/GRAFTS ANGIOGRAPHY N/A 02/20/2019   Procedure: LEFT HEART CATH AND CORS/GRAFTS ANGIOGRAPHY;  Surgeon: Belva Crome, MD;  Location: [facility] CV LAB;  Service: Cardiovascular;  Laterality: N/A;  . LEFT HEART CATHETERIZATION WITH CORONARY/GRAFT ANGIOGRAM N/A 10/17/2011   Procedure: LEFT HEART CATHETERIZATION WITH Beatrix Fetters;  Surgeon: [name], MD;  Location: [facility] CATH LAB;  Service: Cardiovascular;  Laterality: N/A;  . LEFT HEART CATHETERIZATION WITH CORONARY/GRAFT ANGIOGRAM N/A 02/08/2012   Procedure: LEFT HEART CATHETERIZATION WITH Beatrix Fetters;  Surgeon: [name], MD;  Location: [facility] CATH LAB;  Service: Cardiovascular;  Laterality: N/A;  . NM MYOCAR PERF WALL MOTION  03/23/2011   Low risk  . PERCUTANEOUS CORONARY STENT INTERVENTION (PCI-S)  10/17/2011   Procedure: PERCUTANEOUS CORONARY STENT INTERVENTION (PCI-S);  Surgeon: [name], MD;  Location: [facility] CATH LAB;  Service: Cardiovascular;;  . PPM GENERATOR CHANGEOUT N/A 02/20/2019    Procedure: PPM GENERATOR CHANGEOUT;  Surgeon: [name], MD;  Location: [facility] CV LAB;  Service: Cardiovascular;  Laterality: N/A;  . TEE WITHOUT CARDIOVERSION  04/23/2012   Procedure: TRANSESOPHAGEAL ECHOCARDIOGRAM (TEE);  Surgeon: Josue Hector, MD;  Location: [facility] ENDOSCOPY;  Service: Cardiovascular;  Laterality: N/A;  spoke to pt about time change from 11a to 12p/DL  . TEE WITHOUT CARDIOVERSION N/A 12/01/2012   Procedure: TRANSESOPHAGEAL ECHOCARDIOGRAM (TEE);  Surgeon: [name], MD;  Location: [facility] ENDOSCOPY;  Service: Cardiovascular;  Laterality: N/A;        Home Medications    Prior to Admission medications   Medication Sig Start Date End Date Taking? Authorizing Provider  atenolol (TENORMIN) 100 MG tablet TAKE 1 TABLET BY MOUTH ONCE DAILY 01/01/18   Harrison Mons, PA  clonazePAM (KLONOPIN) 0.5 MG tablet Take 1 tablet (0.5 mg total) by mouth 2 (two) times daily as needed for anxiety. 12/13/14   Copland, Gay Filler, MD  clopidogrel (PLAVIX) 75 MG tablet Take 1 tablet by mouth once daily Patient  taking differently: Take 75 mg by mouth daily.  01/05/19   Croitoru, Mihai, MD  diltiazem (DILT-XR) 180 MG 24 hr capsule Take 1 capsule (180 mg total) by mouth daily. NEED OV. 12/08/18   Croitoru, Dani Gobble, MD  dofetilide (TIKOSYN) 500 MCG capsule Take 1 capsule by mouth twice daily Patient taking differently: Take 500 mcg by mouth 2 (two) times daily.  01/05/19   Croitoru, Mihai, MD  fish oil-omega-3 fatty acids 1000 MG capsule Take 2 g by mouth daily.     [provider]  isosorbide mononitrate (IMDUR) 30 MG 24 hr tablet Take 1 tablet (30 mg total) by mouth daily. 02/16/19 05/17/19  Croitoru, Mihai, MD  lisinopril (PRINIVIL,ZESTRIL) 20 MG tablet Take 1 tablet (20 mg total) by mouth daily. 02/04/18   Harrison Mons, PA  metFORMIN (GLUCOPHAGE) 500 MG tablet Take 1 tablet (500 mg total) by mouth 2 (two) times daily with a meal. 06/18/18   Forrest Moron, MD  nitroGLYCERIN (NITROSTAT)  0.4 MG SL tablet Place 1 tablet (0.4 mg total) under the tongue every 5 (five) minutes as needed for up to 10 days for chest pain. 03/25/19 04/04/19  Janeece Fitting, PA-C  potassium chloride (K-DUR) 10 MEQ tablet Take 1 tablet by mouth once daily 01/12/19   Croitoru, Mihai, MD  rosuvastatin (CRESTOR) 40 MG tablet TAKE 1 TABLET BY MOUTH ONCE DAILY 11/01/17   Harrison Mons, PA  venlafaxine XR (EFFEXOR-XR) 150 MG 24 hr capsule TAKE ONE CAPSULE BY MOUTH ONCE DAILY WITH  THE  75  MG  TO  TOTAL  225  MG  DAILY Patient taking differently: Take 225 mg by mouth See admin instructions. TAKE ONE CAPSULE BY MOUTH ONCE DAILY WITH  THE  75  MG  TO  TOTAL  225  MG  DAILY 07/12/17   Jeffery, [place], PA  venlafaxine XR (EFFEXOR-XR) 75 MG 24 hr capsule TAKE ONE CAPSULE BY MOUTH ONCE DAILY TAKE  WITH  150MG   TO  EQUAL  A  TOTAL  OF  225MG   DAILY Patient taking differently: Take 225 mg by mouth See admin instructions. 75 mg and 225 mg daily 07/12/17   Harrison Mons, PA  warfarin (COUMADIN) 5 MG tablet TAKE 1/2 TO 1 (ONE-HALF TO ONE) TABLET BY MOUTH ONCE DAILY AS DIRECTED BY COUMADIN CLINIC 01/26/19   Croitoru, Dani Gobble, MD    Family History Family History  Problem Relation Age of Onset  . Colon cancer Mother   . Alzheimer's disease Father   . Heart disease Father   . Hypertension Sister   . Stroke Brother   . Hypertension Brother   . Heart attack Brother   . Heart disease Brother   . Hypertension Brother   . Diabetes Brother   . Coronary artery disease Unknown   . Stomach cancer Neg Hx   . Pancreatic cancer Neg Hx     Social History Social History   Tobacco Use  . Smoking status: Former Smoker    Years: 1.00    Types: Cigarettes    Quit date: 12/18/1963    Years since quitting: 55.3  . Smokeless tobacco: Former Systems developer    Quit date: 10/03/2012  . Tobacco comment: quit smoking in 1970's  Substance Use Topics  . Alcohol use: Yes    Alcohol/week: 1.0 standard drinks    Types: 1 Cans of beer per week     Comment: rare  . Drug use: Yes    Types: Marijuana    Comment: sometimes (down  from every day)     Allergies   Patient has no known allergies.   Review of Systems Review of Systems  Constitutional: Negative for chills and fever.  HENT: Negative for ear pain and sore throat.   Eyes: Negative for pain and visual disturbance.  Respiratory: Positive for shortness of breath. Negative for cough.   Cardiovascular: Positive for chest pain. Negative for palpitations.  Gastrointestinal: Negative for abdominal pain and vomiting.  Genitourinary: Negative for dysuria and hematuria.  Musculoskeletal: Negative for arthralgias and back pain.  Skin: Negative for color change and rash.  Neurological: Negative for seizures and syncope.  All other systems reviewed and are negative.    Physical Exam Updated Vital Signs BP (!) 131/100 (BP Location: Right Arm)   Pulse (!) 43   Temp 98.5 F (36.9 C) (Oral)   Resp 18   SpO2 97%   Physical Exam Vitals signs and nursing note reviewed.  Constitutional:      Appearance: He is well-developed.     Comments: Non ill appearing.   HENT:     Head: Normocephalic and atraumatic.  Eyes:     General: No scleral icterus.    Pupils: Pupils are equal, round, and reactive to light.  Neck:     Musculoskeletal: Normal range of motion.  Cardiovascular:     Heart sounds: Normal heart sounds.  Pulmonary:     Effort: Pulmonary effort is normal.     Breath sounds: Normal breath sounds. No decreased breath sounds or wheezing.     Comments: No rales, no wheezing, no rhonchi.  Chest:     Chest wall: No tenderness.  Abdominal:     General: Bowel sounds are normal. There is no distension.     Palpations: Abdomen is soft.     Tenderness: There is no abdominal tenderness.  Musculoskeletal:        General: No tenderness or deformity.     Comments: No right lower leg edema.   Skin:    General: Skin is warm and dry.  Neurological:     Mental Status: He is  alert and oriented to person, place, and time.      ED Treatments / Results  Labs (all labs ordered are listed, but only abnormal results are displayed) Labs Reviewed  COMPREHENSIVE METABOLIC PANEL - Abnormal; Notable for the following components:      Result Value   Glucose, Bld 166 (*)    All other components within normal limits  PROTIME-INR - Abnormal; Notable for the following components:   Prothrombin Time 27.6 (*)    INR 2.6 (*)    All other components within normal limits  TROPONIN I (HIGH SENSITIVITY) - Abnormal; Notable for the following components:   Troponin I (High Sensitivity) 45 (*)    All other components within normal limits  CBC WITH DIFFERENTIAL/PLATELET  URINALYSIS, ROUTINE W REFLEX MICROSCOPIC  TROPONIN I (HIGH SENSITIVITY)    EKG EKG Interpretation  Date/Time:  Wednesday March 25 2019 19:31:03 EDT Ventricular Rate:  97 PR Interval:    QRS Duration: 83 QT Interval:  374 QTC Calculation: 476 R Axis:   128 Text Interpretation:  Atrial fibrillation Right axis deviation Borderline T wave abnormalities Borderline prolonged QT interval qrs narrowing Otherwise no significant change Confirmed by Deno Etienne [phone]) on 03/25/2019 8:51:05 PM   Radiology Dg Chest 2 View  Result Date: 03/25/2019 CLINICAL DATA:  Chest pain EXAM: CHEST - 2 VIEW COMPARISON:  October 12, 2015 FINDINGS: Lungs are clear.  Heart size and pulmonary vascularity are normal. No adenopathy. Patient is status post coronary artery bypass grafting. Pacemaker leads are attached to the right atrium and right ventricle. No bone lesions. IMPRESSION: No edema or consolidation.  Stable cardiac silhouette. Electronically Signed   By: Lowella Grip III M.D.   On: 03/25/2019 19:07    Procedures Procedures (including critical care time)  Medications Ordered in ED Medications - No data to display   Initial Impression / Assessment and Plan / ED Course  I have reviewed the triage vital signs and the  nursing notes.  Pertinent labs & imaging results that were available during my care of the patient were reviewed by me and considered in my medical decision making (see chart for details).    Patient with a past medical history of defibrillator placement, recent catheterization on February 21, 2019.  Currently being managed with with medical management by Dr. Sallyanne Kuster.  Comes in for chest pain that began today, described as a chest tightness, did have some shortness of breath while walking up the hill yesterday.  Patient received relief from his chest pain with nitroglycerin which he took prior to arrival, although the medication that he had at home was expired.  He was given by EMS 2 nitros which were helpful with his pain significantly. CMP showed no electrolyte normality, glucose is slightly elevated, LFT is within normal limits.  CBC showed no leukocytosis, hemoglobin is within normal limits.  PT and INR 27.6/2. First troponin was 45. Xray of his chest showed: No edema or consolidation. Stable cardiac silhouette.  Heart score of 4  9:19 PM Spoke to cardiology, Dr. Hassell Done radiology who recommends M. Doerr therapy needs to be increased to 60 mg daily instead of 30 mg.  Patient is to take this at once, does not need to be twice daily.  She also reports she will be placing him on a list to have his appointment moved up from September to sooner.  Patient is not a candidate for PCI.  Will discuss this with patient.  9:38 PM shared decision making conversation with patient, he is to follow-up with cardiology outpatient, he will have his IMDUR increased to 60 mg daily.  Will place these instructions on his paperwork.  He will also follow-up with cardiology, he knows cardiology will give him an appointment within a week to 2 weeks.  Patient is pain-free while in the ED, he is requesting food along with discharge home.  I feel is appropriate for patient to follow-up outpatient as he recently had a cath on 6  June.  I will also provide patient with a prescription for nitroglycerin, he does have some of this at home however this has expired.  Although patient has a high heart score, consulted with cardiology who agrees with plan, patient is agreeable and discharged home.  Return precautions described at length.   Portions of this note were generated with Lobbyist. Dictation errors may occur despite best attempts at proofreading.    Final Clinical Impressions(s) / ED Diagnoses   Final diagnoses:  Atypical chest pain    ED Discharge Orders         Ordered    nitroGLYCERIN (NITROSTAT) 0.4 MG SL tablet  Every 5 min PRN     03/25/19 2140           Janeece Fitting, PA-C 03/25/19 2158    Deno Etienne, DO 03/25/19 2317

## 2019-04-03 ENCOUNTER — Other Ambulatory Visit: Payer: Self-pay | Admitting: Cardiovascular Disease

## 2019-04-07 ENCOUNTER — Telehealth: Payer: Self-pay | Admitting: *Deleted

## 2019-04-07 NOTE — Telephone Encounter (Signed)
Left a message for the patient to call back to change his appointment to virtual

## 2019-04-07 NOTE — Telephone Encounter (Signed)
Left a third message for the patient stating that tomorrow's appointment would be a virtual one and not an in office appointment.

## 2019-04-07 NOTE — Telephone Encounter (Signed)
Left a message for the patient to call back to change his visit tomorrow to virtual and to get consent.

## 2019-04-08 ENCOUNTER — Other Ambulatory Visit: Payer: Self-pay | Admitting: Internal Medicine

## 2019-04-08 ENCOUNTER — Telehealth (INDEPENDENT_AMBULATORY_CARE_PROVIDER_SITE_OTHER): Payer: BC Managed Care – PPO | Admitting: Cardiovascular Disease

## 2019-04-08 ENCOUNTER — Telehealth: Payer: Self-pay | Admitting: *Deleted

## 2019-04-08 ENCOUNTER — Encounter: Payer: Self-pay | Admitting: Cardiovascular Disease

## 2019-04-08 DIAGNOSIS — E119 Type 2 diabetes mellitus without complications: Secondary | ICD-10-CM

## 2019-04-08 DIAGNOSIS — I10 Essential (primary) hypertension: Secondary | ICD-10-CM

## 2019-04-08 DIAGNOSIS — Z7901 Long term (current) use of anticoagulants: Secondary | ICD-10-CM

## 2019-04-08 DIAGNOSIS — Z01818 Encounter for other preprocedural examination: Secondary | ICD-10-CM

## 2019-04-08 DIAGNOSIS — Z955 Presence of coronary angioplasty implant and graft: Secondary | ICD-10-CM

## 2019-04-08 DIAGNOSIS — I25110 Atherosclerotic heart disease of native coronary artery with unstable angina pectoris: Secondary | ICD-10-CM

## 2019-04-08 DIAGNOSIS — R55 Syncope and collapse: Secondary | ICD-10-CM

## 2019-04-08 DIAGNOSIS — I480 Paroxysmal atrial fibrillation: Secondary | ICD-10-CM

## 2019-04-08 DIAGNOSIS — Z950 Presence of cardiac pacemaker: Secondary | ICD-10-CM

## 2019-04-08 DIAGNOSIS — I495 Sick sinus syndrome: Secondary | ICD-10-CM

## 2019-04-08 DIAGNOSIS — I25700 Atherosclerosis of coronary artery bypass graft(s), unspecified, with unstable angina pectoris: Secondary | ICD-10-CM | POA: Diagnosis not present

## 2019-04-08 DIAGNOSIS — I200 Unstable angina: Secondary | ICD-10-CM

## 2019-04-08 DIAGNOSIS — E785 Hyperlipidemia, unspecified: Secondary | ICD-10-CM

## 2019-04-08 MED ORDER — ISOSORBIDE MONONITRATE ER 30 MG PO TB24: 90.0000 mg | ORAL_TABLET | Freq: Every day | ORAL | 11 refills | Status: DC

## 2019-04-08 NOTE — Telephone Encounter (Signed)
Spoke with the patient and his wife to go over the instructions from the virtual visit today and the pre cath instructions. The wife and husband have both been prescreened.  You are scheduled for a Cardiac Catheterization on Monday, July 27 with Dr. Harrell Gave End. Please arrive at the [facility] (Main Entrance A) at [facility]: [address] [place], [place] [address] at 5:30 AM (This time is two hours before your procedure to ensure your preparation). Free valet parking service is available.   Labs: Your provider would like for you to return on Thursday 04/09/2019 to have the following labs drawn: Lipid, A1C CBC, BMET and PT/INR. You do not need an appointment for the lab. Once in our office lobby there is a podium where you can sign in and ring the doorbell to alert [place] that you are here. The lab is open from 8:00 am to 4:30 pm; closed for lunch from 12:45pm-1:45pm.  You will need to have the coronavirus test completed prior to your procedure. An appointment has been made at 1:50pm on 04/09/2019. This will be completed at [facility]. This is a drive thru test only. Please make sure to have all other labs completed before this test because you will need to stay quarantined until your procedure.  Medication instructions in preparation for your procedure: Hold Metformin the morning of the procedure and then 48 hours after Hold the Lisinopril the morning of the cardiac cath STOP the warfarin starting today, 04/08/2019, until told to resume.   On the morning of your procedure, take your 325 mg Aspirin and Plavix/Clopidogrel and any morning medicines NOT listed above.  You may use sips of water      COVID-19 Pre-Screening Questions:  . In the past 7 to 10 days have you had a cough,  shortness of breath, headache, congestion, fever (100 or greater) body aches, chills, sore throat, or sudden loss of taste or sense of smell? No . Have you been around anyone  with known Covid 19. No . Have you been around anyone who is awaiting Covid 19 test results in the past 7 to 10 days? No . Have you been around anyone who has been exposed to Covid 19, or has mentioned symptoms of Covid 19 within the past 7 to 10 days? No  If you have any concerns/questions about symptoms patients report during screening (either on the phone or at threshold). Contact the provider seeing the patient or DOD for further guidance.  If neither are available contact a member of the leadership team.

## 2019-04-08 NOTE — H&P (View-Only) (Signed)
Virtual Visit via Video Note   This visit type was conducted due to national recommendations for restrictions regarding the COVID-19 Pandemic (e.g. social distancing) in an effort to limit this patient's exposure and mitigate transmission in our community.  Due to his co-morbid illnesses, this patient is at least at moderate risk for complications without adequate follow up.  This format is felt to be most appropriate for this patient at this time.  All issues noted in this document were discussed and addressed.  A limited physical exam was performed with this format.  Please refer to the patient's chart for his consent to telehealth for [facility].   Date:  04/08/2019   ID:  Lucas Porter, DOB July 23, 1956, MRN [id number]  Patient Location: Other:  car Provider Location: Other:  [place]  PCP:  Patient, No Pcp Per  Cardiologist:  [name], MD  Electrophysiologist:  None   Evaluation Performed:  Follow-Up Visit  Chief Complaint:  Angina, crescendo  History of Present Illness:    Lucas Porter is a 63 y.o. 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  Because of recurrent angina pectoris he underwent elective heart catheterization on February 20, 2019 and was found to have high-grade stenosis in the distal left circumflex coronary artery and a saphenous vein graft to the diagonal artery.  Neither vessel was particularly large and his symptoms appeared to improve after initiation of a long-acting nitrates, so medical therapy was advised, with an option for percutaneous revascularization if the symptoms persisted.  He  underwent pacemaker generator change out on the same day.   "Significant coronary disease with ostial occlusion of the native LAD, 40 to 50% in-stent restenosis in the left main to proximal circumflex stent, new 50% proximal circumflex narrowing just distal to the stent, 90% obstruction in the second obtuse marginal, 100% stenosis of the ramus intermedius, 80% stenosis in the distal circumflex, and total occlusion of the native RCA.  Total occlusion of the free radial to the distal right coronary.  Eccentric 40% stenosis in the saphenous vein graft to the ramus intermedius.  Eccentric 70 to 75% stenosis in the proximal graft to the diagonal.  Patent LIMA to LAD.  Dyssynergy of the left ventricle with estimated EF 40 to 50% and hemodynamic evidence of diastolic heart failure with EDP 22 mmHg.  RECOMMENDATIONS:   Symptoms have improved significantly with low-dose isosorbide.  Plan to continue medical therapy and consider intervention on the saphenous vein graft to diagonal and question distal native circumflex if refractory symptoms. We will proceed with device change out today per Dr. Sallyanne Kuster."  He did well for a few weeks but on July 8 was seen in the emergency room for a protracted episode of angina pectoris that did not resolve after 3 sublingual nitroglycerin tablets.  Cardiac high-sensitivity troponin was minimally elevated and stable at about 45.  The ECG did not show high risk findings.  The isosorbide mononitrate was increased to 60 mg daily.  His symptoms subsided, for the most part, but he has had a constant lingering 2/10 chest tightness ever since then.  This becomes worse if he tries to do any physical activity.  Pacemaker site is healing well.  Lucas Porter also has a history of neurocardiogenic syncope, which thankfully  has not worsened since he started treatment with nitrates.  Device interrogation shows that he continues to have a very high burden of atrial fibrillation  (60-80%) despite treatment with dofetilide.  He appears to be unaware of the arrhythmia.  Rate control is good.  He is on chronic warfarin anticoagulation and his most recent INR was 2.6 on July 8.  He is also taking clopidogrel.  The patient does not have symptoms concerning for COVID-19 infection (fever, chills, cough, or new shortness of breath).    Past Medical History:  Diagnosis Date   Anxiety    Atrial fibrillation (HCC)    paroxysmal   Atrial flutter ([place])    Coagulopathy ([place])    persistent elevation of ACT during 10/17/11 hospitalization   Coronary artery disease    s/p CABG 2010, s/p Successful PCI  10/17/11 of Distal Left Main and the Left Circumflex with a Promus Element DES stent -- 4.0 mm x 16 mm postdilated to 4.12 mm    DDD (degenerative disc disease)    Depression    Dysrhythmia    afib   Facial asymmetry 2010   pt. stated it was very temporary, never seen by doctor for it.    GERD (gastroesophageal reflux disease)    Heart murmur    Hyperlipidemia    Hypertension    Kidney stones    Presence of permanent cardiac pacemaker    Medtronic    S/P coronary artery stent placement to distal Lt. main and Lt. Cx 10/17/11   Tachycardia-bradycardia syndrome ([place]) 2/12   s/p PPM (MDT) by Dr Blanch Media   Past Surgical History:  Procedure Laterality Date   ATRIAL FIBRILLATION ABLATION  04/24/12, 12/02/12   PVI x2 and CTI ablation by Dr Rayann Heman   ATRIAL FIBRILLATION ABLATION N/A 04/24/2012   Procedure: ATRIAL FIBRILLATION ABLATION;  Surgeon: Thompson Grayer, MD;  Location: [facility] CATH LAB;  Service: Cardiovascular;  Laterality: N/A;   ATRIAL FIBRILLATION ABLATION N/A 12/02/2012   Procedure: ATRIAL FIBRILLATION ABLATION;  Surgeon: Thompson Grayer, MD;  Location: [facility] CATH LAB;  Service: Cardiovascular;  Laterality: N/A;   CORONARY ANGIOPLASTY  10/19/11   CORONARY ARTERY BYPASS GRAFT  2010   CYSTOSCOPY  2007 ?   treatment for Kidney stones    INGUINAL HERNIA REPAIR Left  01/04/2016   Procedure: LAPAROSCOPIC LEFT INGUINAL HERNIA WITH MESH;  Surgeon: Ralene Ok, MD;  Location: [place];  Service: General;  Laterality: Left;   INSERT / REPLACE / REMOVE PACEMAKER  2/12   Medtronic   INSERTION OF MESH Left 01/04/2016   Procedure: INSERTION OF MESH;  Surgeon: Ralene Ok, MD;  Location: [place];  Service: General;  Laterality: Left;   LAPAROSCOPIC LYSIS OF ADHESIONS Left 01/04/2016   Procedure: LAPAROSCOPIC LYSIS OF ADHESIONS;  Surgeon: Ralene Ok, MD;  Location: [place];  Service: General;  Laterality: Left;   LEFT HEART CATH AND CORS/GRAFTS ANGIOGRAPHY N/A 02/20/2019   Procedure: LEFT HEART CATH AND CORS/GRAFTS ANGIOGRAPHY;  Surgeon: Belva Crome, MD;  Location: [place] CV LAB;  Service: Cardiovascular;  Laterality: N/A;   LEFT HEART CATHETERIZATION WITH CORONARY/GRAFT ANGIOGRAM N/A 10/17/2011   Procedure: LEFT HEART CATHETERIZATION WITH Beatrix Fetters;  Surgeon: [name], MD;  Location: [place] CATH LAB;  Service: Cardiovascular;  Laterality: N/A;   LEFT HEART CATHETERIZATION WITH CORONARY/GRAFT ANGIOGRAM N/A 02/08/2012   Procedure: LEFT HEART CATHETERIZATION WITH Beatrix Fetters;  Surgeon: [name], MD;  Location: [place] CATH LAB;  Service: Cardiovascular;  Laterality: N/A;   NM MYOCAR  PERF WALL MOTION  03/23/2011   Low risk   PERCUTANEOUS CORONARY STENT INTERVENTION (PCI-S)  10/17/2011   Procedure: PERCUTANEOUS CORONARY STENT INTERVENTION (PCI-S);  Surgeon: [name], MD;  Location: [facility] CATH LAB;  Service: Cardiovascular;;   PPM GENERATOR CHANGEOUT N/A 02/20/2019   Procedure: PPM GENERATOR CHANGEOUT;  Surgeon: [name], MD;  Location: [place] CV LAB;  Service: Cardiovascular;  Laterality: N/A;   TEE WITHOUT CARDIOVERSION  04/23/2012   Procedure: TRANSESOPHAGEAL ECHOCARDIOGRAM (TEE);  Surgeon: Josue Hector, MD;  Location: [facility] ENDOSCOPY;  Service: Cardiovascular;  Laterality: N/A;  spoke to pt about time change from  11a to 12p/DL   TEE WITHOUT CARDIOVERSION N/A 12/01/2012   Procedure: TRANSESOPHAGEAL ECHOCARDIOGRAM (TEE);  Surgeon: [name], MD;  Location[facility] ENDOSCOPY;  Service: Cardiovascular;  Laterality: N/A;     Current Meds  Medication Sig   atenolol (TENORMIN) 100 MG tablet TAKE 1 TABLET BY MOUTH ONCE DAILY   clonazePAM (KLONOPIN) 0.5 MG tablet Take 1 tablet (0.5 mg total) by mouth 2 (two) times daily as needed for anxiety.   clopidogrel (PLAVIX) 75 MG tablet Take 1 tablet by mouth once daily   diltiazem (DILT-XR) 180 MG 24 hr capsule Take 1 capsule (180 mg total) by mouth daily. NEED OV.   dofetilide (TIKOSYN) 500 MCG capsule Take 1 capsule by mouth twice daily (Patient taking differently: Take 500 mcg by mouth 2 (two) times daily. )   fish oil-omega-3 fatty acids 1000 MG capsule Take 2 g by mouth daily.    isosorbide mononitrate (IMDUR) 30 MG 24 hr tablet Take 1 tablet (30 mg total) by mouth daily. (Patient taking differently: Take 30 mg by mouth 2 (two) times a day. )   lisinopril (PRINIVIL,ZESTRIL) 20 MG tablet Take 1 tablet (20 mg total) by mouth daily.   metFORMIN (GLUCOPHAGE) 500 MG tablet Take 1 tablet (500 mg total) by mouth 2 (two) times daily with a meal.   nitroGLYCERIN (NITROSTAT) 0.4 MG SL tablet Place 1 tablet (0.4 mg total) under the tongue every 5 (five) minutes as needed for up to 10 days for chest pain.   potassium chloride (K-DUR) 10 MEQ tablet Take 1 tablet by mouth once daily   rosuvastatin (CRESTOR) 40 MG tablet TAKE 1 TABLET BY MOUTH ONCE DAILY   venlafaxine XR (EFFEXOR-XR) 150 MG 24 hr capsule TAKE ONE CAPSULE BY MOUTH ONCE DAILY WITH  THE  75  MG  TO  TOTAL  225  MG  DAILY (Patient taking differently: Take 225 mg by mouth See admin instructions. TAKE ONE CAPSULE BY MOUTH ONCE DAILY WITH  THE  75  MG  TO  TOTAL  225  MG  DAILY)   venlafaxine XR (EFFEXOR-XR) 75 MG 24 hr capsule TAKE ONE CAPSULE BY MOUTH ONCE DAILY TAKE  WITH  150MG   TO  EQUAL  A  TOTAL  OF   225MG   DAILY (Patient taking differently: Take 225 mg by mouth See admin instructions. 75 mg and 225 mg daily)   warfarin (COUMADIN) 5 MG tablet TAKE 1/2 TO 1 (ONE-HALF TO ONE) TABLET BY MOUTH ONCE DAILY AS DIRECTED BY COUMADIN CLINIC     Allergies:   Patient has no known allergies.   Social History   Tobacco Use   Smoking status: Former Smoker    Years: 1.00    Types: Cigarettes    Quit date: 12/18/1963    Years since quitting: 55.3   Smokeless tobacco: Former Systems developer    Quit date: 10/03/2012  Tobacco comment: quit smoking in 1970's  Substance Use Topics   Alcohol use: Yes    Alcohol/week: 1.0 standard drinks    Types: 1 Cans of beer per week    Comment: rare   Drug use: Yes    Types: Marijuana    Comment: sometimes (down from every day)     Family Hx: The patient's family history includes Alzheimer's disease in his father; Colon cancer in his mother; Coronary artery disease in his unknown relative; Diabetes in his brother; Heart attack in his brother; Heart disease in his brother and father; Hypertension in his brother, brother, and sister; Stroke in his brother. There is no history of Stomach cancer or Pancreatic cancer.  ROS:   Please see the history of present illness.    All other systems reviewed and are negative.   Prior CV studies:   The following studies were reviewed today: Coronary angiography February 20, 2019 Emergency room visit March 25, 2019  Labs/Other Tests and Data Reviewed:    EKG:  An ECG dated 03/25/2019 was personally reviewed today and demonstrated:  Atrial fibrillation with controlled ventricular rate, right axis deviation, nonspecific T wave changes, QTc interval 476 ms  Recent Labs: 03/25/2019: ALT 22; BUN 13; Creatinine, Ser 0.93; Hemoglobin 14.9; Platelets 193; Potassium 3.9; Sodium 138   Recent Lipid Panel Lab Results  Component Value Date/Time   CHOL 129 02/04/2018 11:03 AM   TRIG 159 (H) 02/04/2018 11:03 AM   HDL 44 02/04/2018 11:03 AM    CHOLHDL 2.9 02/04/2018 11:03 AM   CHOLHDL 5.9 (H) 09/24/2016 10:10 AM   LDLCALC 53 02/04/2018 11:03 AM    Wt Readings from Last 3 Encounters:  02/20/19 215 lb (97.5 kg)  02/16/19 215 lb (97.5 kg)  02/04/18 216 lb 9.6 oz (98.2 kg)     Objective:    Vital Signs:  There were no vitals taken for this visit.   VITAL SIGNS:  reviewed GEN:  no acute distress EYES:  sclerae anicteric, EOMI - Extraocular Movements Intact RESPIRATORY:  normal respiratory effort, symmetric expansion CARDIOVASCULAR:  no peripheral edema SKIN:  no rash, lesions or ulcers. MUSCULOSKELETAL:  no obvious deformities. NEURO:  alert and oriented x 3, no obvious focal deficit PSYCH:  Normal affect, but appears anxious and uneasy Obese Pacemaker site appears to be healing well, without evidence of swelling/hematoma/redness/dehiscence. ASSESSMENT & PLAN:    1. CAD s/p CABG w Crescendo angina: Despite treatment with 3 different antianginal medications he continues to have worsening angina pectoris.  I recommended that he undergo percutaneous revascularization addressing the saphenous vein graft to the diagonal artery and if possible also the distal left circumflex coronary artery lesion.  Stop warfarin today. Load on aspirin on the day of the procedure.  Continue clopidogrel. Perform necessary preprocedural labs including coronavirus testing.  Do not take ACE inhibitor or metformin on the day of the procedure.   2. AFib: The burden of atrial fibrillation has increased to such a degree that I do not think the benefit of dofetilide is there anymore.  Rate control is appropriate.  More likely to have side effects from dofetilide/drug interactions then he is to have benefit.  Will discontinue dofetilide.  If necessary, this change will also allow [place] to use the Ranexa as an antianginal, if percutaneous revascularization is not sufficient for symptom relief. 3. PM: Recent generator change out, normal device function.  At some point  in the near future will probably change the device settings to VVIR if  it looks like he has permanent atrial fibrillation.  4. Anticoagulation: Relatively low embolic risk: CHADSVasc 2 (HTN, CAD).  I do not think he needs "bridging" for his catheterization.  Would leave antiplatelet therapy choices to the interventional cardiologist, but suspect he will be on aspirin and warfarin for a month, clopidogrel plus warfarin thereafter.  5. Neurally mediated syncope: He has not had full-blown syncope since pacemaker implantation, but still has occasional presyncope likely related to vasodepressive events.  If he has good angina relief from percutaneous revascularization I would then try to back off vasodilator medications.  6. Obesity/DM/HLP: He did not have a lipid profile checked at his last cardiac catheterization.  In May 2019 LDL cholesterol was 53 and the hemoglobin A1c was will check a lipid profile and hemoglobin A1c was 6.5%, on the same medical regimen.  COVID-19 Education: The signs and symptoms of COVID-19 were discussed with the patient and how to seek care for testing (follow up with PCP or arrange E-visit).  The importance of social distancing was discussed today.  Time:   Today, I have spent 21 minutes with the patient with telehealth technology discussing the above problems.     Medication Adjustments/Labs and Tests Ordered: Current medicines are reviewed at length with the patient today.  Concerns regarding medicines are outlined above.   Tests Ordered: No orders of the defined types were placed in this encounter.   Medication Changes: No orders of the defined types were placed in this encounter.  Patient Instructions  Medication Instructions:  STOP the Dofetilide (Tikosyn) INCREASE the Isosorbide (Imdur) to 90 mg once daily  If you need a refill on your cardiac medications before your next appointment, please call your pharmacy.   Testing/Procedures: Your physician has  requested that you have a cardiac catheterization. Cardiac catheterization is used to diagnose and/or treat various heart conditions. Doctors may recommend this procedure for a number of different reasons. The most common reason is to evaluate chest pain. Chest pain can be a symptom of coronary artery disease (CAD), and cardiac catheterization can show whether plaque is narrowing or blocking your hearts arteries. This procedure is also used to evaluate the valves, as well as measure the blood flow and oxygen levels in different parts of your heart. For further information please visit [email or web address]. Please follow instruction sheet, as given.   Follow-Up:  Follow up 2-4 weeks after the cardiac catheterization with Dr. Sallyanne Kuster. We will call to set this appointment up  Any Other Special Instructions Will Be Listed Below (If Applicable).    [address] [place] [address] Dept: [phone] Loc: [place]  04/08/2019  You are scheduled for a Cardiac Catheterization on Monday, July 27 with Dr. Harrell Gave End.  1. Please arrive at the [facility] (Main Entrance A) at [facility]: [place], [place] [address] at 5:30 AM (This time is two hours before your procedure to ensure your preparation). Free valet parking service is available.   Special note: Every effort is made to have your procedure done on time. Please understand that emergencies sometimes delay scheduled procedures.  2. Diet: Do not eat solid foods after midnight.  The patient may have clear liquids until 5am upon the day of the procedure  3. Labs: Your provider would like for you to return on Thursday 04/09/2019 to have the following labs drawn: CBC, BMET and PT/INR. You do not  need an appointment for the lab. Once in our office lobby there is a podium where you can sign in and ring  the doorbell to alert [place] that you are here. The lab is open from 8:00 am to 4:30 pm; closed for lunch from 12:45pm-1:45pm.  You will need to have the coronavirus test completed prior to your procedure. An appointment has been made at 1:50pm on 04/09/2019. This will be completed at [facility]. This is a drive thru test only. Please make sure to have all other labs completed before this test because you will need to stay quarantined until your procedure.   4. Medication instructions in preparation for your procedure: Hold Metformin the morning of the procedure and then 48 hours after Hold the Lisinopril the morning of the cardiac cath STOP the warfarin starting today, 04/08/2019, until told to resume.   On the morning of your procedure, take your 325 mg Aspirin and Plavix/Clopidogrel and any morning medicines NOT listed above.  You may use sips of water.  5. Plan for one night stay--bring personal belongings. 6. Bring a current list of your medications and current insurance cards. 7. You MUST have a responsible person to drive you home. 8. Someone MUST be with you the first 24 hours after you arrive home or your discharge will be delayed. 9. Please wear clothes that are easy to get on and off and wear slip-on shoes.  Thank you for allowing [place] to care for you!   -- [facility]       Follow Up:  In Person 3 months  Signed, [name], MD  04/08/2019 9:31 AM    [place]

## 2019-04-08 NOTE — Progress Notes (Signed)
Virtual Visit via Video Note   This visit type was conducted due to national recommendations for restrictions regarding the COVID-19 Pandemic (e.g. social distancing) in an effort to limit this patient's exposure and mitigate transmission in our community.  Due to his co-morbid illnesses, this patient is at least at moderate risk for complications without adequate follow up.  This format is felt to be most appropriate for this patient at this time.  All issues noted in this document were discussed and addressed.  A limited physical exam was performed with this format.  Please refer to the patient's chart for his consent to telehealth for [facility].   Date:  04/08/2019   ID:  Lucas Porter, DOB 10/11/1955, MRN [id number]  Patient Location: Other:  car Provider Location: Other:  [place]  PCP:  Patient, No Pcp Per  Cardiologist:  [name], MD  Electrophysiologist:  None   Evaluation Performed:  Follow-Up Visit  Chief Complaint:  Angina, crescendo  History of Present Illness:    Lucas Porter is a 63 y.o. 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  Because of recurrent angina pectoris he underwent elective heart catheterization on February 20, 2019 and was found to have high-grade stenosis in the distal left circumflex coronary artery and a saphenous vein graft to the diagonal artery.  Neither vessel was particularly large and his symptoms appeared to improve after initiation of a long-acting nitrates, so medical therapy was advised, with an option for percutaneous revascularization if the symptoms persisted.  He  underwent pacemaker generator change out on the same day.   "Significant coronary disease with ostial occlusion of the native LAD, 40 to 50% in-stent restenosis in the left main to proximal circumflex stent, new 50% proximal circumflex narrowing just distal to the stent, 90% obstruction in the second obtuse marginal, 100% stenosis of the ramus intermedius, 80% stenosis in the distal circumflex, and total occlusion of the native RCA.  Total occlusion of the free radial to the distal right coronary.  Eccentric 40% stenosis in the saphenous vein graft to the ramus intermedius.  Eccentric 70 to 75% stenosis in the proximal graft to the diagonal.  Patent LIMA to LAD.  Dyssynergy of the left ventricle with estimated EF 40 to 50% and hemodynamic evidence of diastolic heart failure with EDP 22 mmHg.  RECOMMENDATIONS:   Symptoms have improved significantly with low-dose isosorbide.  Plan to continue medical therapy and consider intervention on the saphenous vein graft to diagonal and question distal native circumflex if refractory symptoms. We will proceed with device change out today per Dr. Sallyanne Kuster."  He did well for a few weeks but on July 8 was seen in the emergency room for a protracted episode of angina pectoris that did not resolve after 3 sublingual nitroglycerin tablets.  Cardiac high-sensitivity troponin was minimally elevated and stable at about 45.  The ECG did not show high risk findings.  The isosorbide mononitrate was increased to 60 mg daily.  His symptoms subsided, for the most part, but he has had a constant lingering 2/10 chest tightness ever since then.  This becomes worse if he tries to do any physical activity.  Pacemaker site is healing well.  Lucas Porter also has a history of neurocardiogenic syncope, which thankfully  has not worsened since he started treatment with nitrates.  Device interrogation shows that he continues to have a very high burden of atrial fibrillation  (60-80%) despite treatment with dofetilide.  He appears to be unaware of the arrhythmia.  Rate control is good.  He is on chronic warfarin anticoagulation and his most recent INR was 2.6 on July 8.  He is also taking clopidogrel.  The patient does not have symptoms concerning for COVID-19 infection (fever, chills, cough, or new shortness of breath).    Past Medical History:  Diagnosis Date   Anxiety    Atrial fibrillation (HCC)    paroxysmal   Atrial flutter ([place])    Coagulopathy ([place])    persistent elevation of ACT during 10/17/11 hospitalization   Coronary artery disease    s/p CABG 2010, s/p Successful PCI  10/17/11 of Distal Left Main and the Left Circumflex with a Promus Element DES stent -- 4.0 mm x 16 mm postdilated to 4.12 mm    DDD (degenerative disc disease)    Depression    Dysrhythmia    afib   Facial asymmetry 2010   pt. stated it was very temporary, never seen by doctor for it.    GERD (gastroesophageal reflux disease)    Heart murmur    Hyperlipidemia    Hypertension    Kidney stones    Presence of permanent cardiac pacemaker    Medtronic    S/P coronary artery stent placement to distal Lt. main and Lt. Cx 10/17/11   Tachycardia-bradycardia syndrome ([place]) 2/12   s/p PPM (MDT) by Dr Blanch Media   Past Surgical History:  Procedure Laterality Date   ATRIAL FIBRILLATION ABLATION  04/24/12, 12/02/12   PVI x2 and CTI ablation by Dr Rayann Heman   ATRIAL FIBRILLATION ABLATION N/A 04/24/2012   Procedure: ATRIAL FIBRILLATION ABLATION;  Surgeon: Thompson Grayer, MD;  Location: [facility] CATH LAB;  Service: Cardiovascular;  Laterality: N/A;   ATRIAL FIBRILLATION ABLATION N/A 12/02/2012   Procedure: ATRIAL FIBRILLATION ABLATION;  Surgeon: Thompson Grayer, MD;  Location: [facility] CATH LAB;  Service: Cardiovascular;  Laterality: N/A;   CORONARY ANGIOPLASTY  10/19/11   CORONARY ARTERY BYPASS GRAFT  2010   CYSTOSCOPY  2007 ?   treatment for Kidney stones    INGUINAL HERNIA REPAIR Left  01/04/2016   Procedure: LAPAROSCOPIC LEFT INGUINAL HERNIA WITH MESH;  Surgeon: Ralene Ok, MD;  Location: [place];  Service: General;  Laterality: Left;   INSERT / REPLACE / REMOVE PACEMAKER  2/12   Medtronic   INSERTION OF MESH Left 01/04/2016   Procedure: INSERTION OF MESH;  Surgeon: Ralene Ok, MD;  Location: [place];  Service: General;  Laterality: Left;   LAPAROSCOPIC LYSIS OF ADHESIONS Left 01/04/2016   Procedure: LAPAROSCOPIC LYSIS OF ADHESIONS;  Surgeon: Ralene Ok, MD;  Location: [place];  Service: General;  Laterality: Left;   LEFT HEART CATH AND CORS/GRAFTS ANGIOGRAPHY N/A 02/20/2019   Procedure: LEFT HEART CATH AND CORS/GRAFTS ANGIOGRAPHY;  Surgeon: Belva Crome, MD;  Location: [place] CV LAB;  Service: Cardiovascular;  Laterality: N/A;   LEFT HEART CATHETERIZATION WITH CORONARY/GRAFT ANGIOGRAM N/A 10/17/2011   Procedure: LEFT HEART CATHETERIZATION WITH Beatrix Fetters;  Surgeon: [name], MD;  Location: [place] CATH LAB;  Service: Cardiovascular;  Laterality: N/A;   LEFT HEART CATHETERIZATION WITH CORONARY/GRAFT ANGIOGRAM N/A 02/08/2012   Procedure: LEFT HEART CATHETERIZATION WITH Beatrix Fetters;  Surgeon: [name], MD;  Location: [facility] CATH LAB;  Service: Cardiovascular;  Laterality: N/A;   NM MYOCAR  PERF WALL MOTION  03/23/2011   Low risk   PERCUTANEOUS CORONARY STENT INTERVENTION (PCI-S)  10/17/2011   Procedure: PERCUTANEOUS CORONARY STENT INTERVENTION (PCI-S);  Surgeon: [name], MD;  Location: [facility] CATH LAB;  Service: Cardiovascular;;   PPM GENERATOR CHANGEOUT N/A 02/20/2019   Procedure: PPM GENERATOR CHANGEOUT;  Surgeon: [name], MD;  Location: [place] CV LAB;  Service: Cardiovascular;  Laterality: N/A;   TEE WITHOUT CARDIOVERSION  04/23/2012   Procedure: TRANSESOPHAGEAL ECHOCARDIOGRAM (TEE);  Surgeon: Josue Hector, MD;  Location: [facility] ENDOSCOPY;  Service: Cardiovascular;  Laterality: N/A;  spoke to pt about time change from  11a to 12p/DL   TEE WITHOUT CARDIOVERSION N/A 12/01/2012   Procedure: TRANSESOPHAGEAL ECHOCARDIOGRAM (TEE);  Surgeon: [name], MD;  Location: [facility] ENDOSCOPY;  Service: Cardiovascular;  Laterality: N/A;     Current Meds  Medication Sig   atenolol (TENORMIN) 100 MG tablet TAKE 1 TABLET BY MOUTH ONCE DAILY   clonazePAM (KLONOPIN) 0.5 MG tablet Take 1 tablet (0.5 mg total) by mouth 2 (two) times daily as needed for anxiety.   clopidogrel (PLAVIX) 75 MG tablet Take 1 tablet by mouth once daily   diltiazem (DILT-XR) 180 MG 24 hr capsule Take 1 capsule (180 mg total) by mouth daily. NEED OV.   dofetilide (TIKOSYN) 500 MCG capsule Take 1 capsule by mouth twice daily (Patient taking differently: Take 500 mcg by mouth 2 (two) times daily. )   fish oil-omega-3 fatty acids 1000 MG capsule Take 2 g by mouth daily.    isosorbide mononitrate (IMDUR) 30 MG 24 hr tablet Take 1 tablet (30 mg total) by mouth daily. (Patient taking differently: Take 30 mg by mouth 2 (two) times a day. )   lisinopril (PRINIVIL,ZESTRIL) 20 MG tablet Take 1 tablet (20 mg total) by mouth daily.   metFORMIN (GLUCOPHAGE) 500 MG tablet Take 1 tablet (500 mg total) by mouth 2 (two) times daily with a meal.   nitroGLYCERIN (NITROSTAT) 0.4 MG SL tablet Place 1 tablet (0.4 mg total) under the tongue every 5 (five) minutes as needed for up to 10 days for chest pain.   potassium chloride (K-DUR) 10 MEQ tablet Take 1 tablet by mouth once daily   rosuvastatin (CRESTOR) 40 MG tablet TAKE 1 TABLET BY MOUTH ONCE DAILY   venlafaxine XR (EFFEXOR-XR) 150 MG 24 hr capsule TAKE ONE CAPSULE BY MOUTH ONCE DAILY WITH  THE  75  MG  TO  TOTAL  225  MG  DAILY (Patient taking differently: Take 225 mg by mouth See admin instructions. TAKE ONE CAPSULE BY MOUTH ONCE DAILY WITH  THE  75  MG  TO  TOTAL  225  MG  DAILY)   venlafaxine XR (EFFEXOR-XR) 75 MG 24 hr capsule TAKE ONE CAPSULE BY MOUTH ONCE DAILY TAKE  WITH  150MG   TO  EQUAL  A  TOTAL  OF   225MG   DAILY (Patient taking differently: Take 225 mg by mouth See admin instructions. 75 mg and 225 mg daily)   warfarin (COUMADIN) 5 MG tablet TAKE 1/2 TO 1 (ONE-HALF TO ONE) TABLET BY MOUTH ONCE DAILY AS DIRECTED BY COUMADIN CLINIC     Allergies:   Patient has no known allergies.   Social History   Tobacco Use   Smoking status: Former Smoker    Years: 1.00    Types: Cigarettes    Quit date: 12/18/1963    Years since quitting: 55.3   Smokeless tobacco: Former Systems developer    Quit date: 10/03/2012  Tobacco comment: quit smoking in 1970's  Substance Use Topics   Alcohol use: Yes    Alcohol/week: 1.0 standard drinks    Types: 1 Cans of beer per week    Comment: rare   Drug use: Yes    Types: Marijuana    Comment: sometimes (down from every day)     Family Hx: The patient's family history includes Alzheimer's disease in his father; Colon cancer in his mother; Coronary artery disease in his unknown relative; Diabetes in his brother; Heart attack in his brother; Heart disease in his brother and father; Hypertension in his brother, brother, and sister; Stroke in his brother. There is no history of Stomach cancer or Pancreatic cancer.  ROS:   Please see the history of present illness.    All other systems reviewed and are negative.   Prior CV studies:   The following studies were reviewed today: Coronary angiography February 20, 2019 Emergency room visit March 25, 2019  Labs/Other Tests and Data Reviewed:    EKG:  An ECG dated 03/25/2019 was personally reviewed today and demonstrated:  Atrial fibrillation with controlled ventricular rate, right axis deviation, nonspecific T wave changes, QTc interval 476 ms  Recent Labs: 03/25/2019: ALT 22; BUN 13; Creatinine, Ser 0.93; Hemoglobin 14.9; Platelets 193; Potassium 3.9; Sodium 138   Recent Lipid Panel Lab Results  Component Value Date/Time   CHOL 129 02/04/2018 11:03 AM   TRIG 159 (H) 02/04/2018 11:03 AM   HDL 44 02/04/2018 11:03 AM    CHOLHDL 2.9 02/04/2018 11:03 AM   CHOLHDL 5.9 (H) 09/24/2016 10:10 AM   LDLCALC 53 02/04/2018 11:03 AM    Wt Readings from Last 3 Encounters:  02/20/19 215 lb (97.5 kg)  02/16/19 215 lb (97.5 kg)  02/04/18 216 lb 9.6 oz (98.2 kg)     Objective:    Vital Signs:  There were no vitals taken for this visit.   VITAL SIGNS:  reviewed GEN:  no acute distress EYES:  sclerae anicteric, EOMI - Extraocular Movements Intact RESPIRATORY:  normal respiratory effort, symmetric expansion CARDIOVASCULAR:  no peripheral edema SKIN:  no rash, lesions or ulcers. MUSCULOSKELETAL:  no obvious deformities. NEURO:  alert and oriented x 3, no obvious focal deficit PSYCH:  Normal affect, but appears anxious and uneasy Obese Pacemaker site appears to be healing well, without evidence of swelling/hematoma/redness/dehiscence. ASSESSMENT & PLAN:    1. CAD s/p CABG w Crescendo angina: Despite treatment with 3 different antianginal medications he continues to have worsening angina pectoris.  I recommended that he undergo percutaneous revascularization addressing the saphenous vein graft to the diagonal artery and if possible also the distal left circumflex coronary artery lesion.  Stop warfarin today. Load on aspirin on the day of the procedure.  Continue clopidogrel. Perform necessary preprocedural labs including coronavirus testing.  Do not take ACE inhibitor or metformin on the day of the procedure.   2. AFib: The burden of atrial fibrillation has increased to such a degree that I do not think the benefit of dofetilide is there anymore.  Rate control is appropriate.  More likely to have side effects from dofetilide/drug interactions then he is to have benefit.  Will discontinue dofetilide.  If necessary, this change will also allow [place] to use the Ranexa as an antianginal, if percutaneous revascularization is not sufficient for symptom relief. 3. PM: Recent generator change out, normal device function.  At some point  in the near future will probably change the device settings to VVIR if  it looks like he has permanent atrial fibrillation.  4. Anticoagulation: Relatively low embolic risk: CHADSVasc 2 (HTN, CAD).  I do not think he needs "bridging" for his catheterization.  Would leave antiplatelet therapy choices to the interventional cardiologist, but suspect he will be on aspirin and warfarin for a month, clopidogrel plus warfarin thereafter.  5. Neurally mediated syncope: He has not had full-blown syncope since pacemaker implantation, but still has occasional presyncope likely related to vasodepressive events.  If he has good angina relief from percutaneous revascularization I would then try to back off vasodilator medications.  6. Obesity/DM/HLP: He did not have a lipid profile checked at his last cardiac catheterization.  In May 2019 LDL cholesterol was 53 and the hemoglobin A1c was will check a lipid profile and hemoglobin A1c was 6.5%, on the same medical regimen.  COVID-19 Education: The signs and symptoms of COVID-19 were discussed with the patient and how to seek care for testing (follow up with PCP or arrange E-visit).  The importance of social distancing was discussed today.  Time:   Today, I have spent 21 minutes with the patient with telehealth technology discussing the above problems.     Medication Adjustments/Labs and Tests Ordered: Current medicines are reviewed at length with the patient today.  Concerns regarding medicines are outlined above.   Tests Ordered: No orders of the defined types were placed in this encounter.   Medication Changes: No orders of the defined types were placed in this encounter.  Patient Instructions  Medication Instructions:  STOP the Dofetilide (Tikosyn) INCREASE the Isosorbide (Imdur) to 90 mg once daily  If you need a refill on your cardiac medications before your next appointment, please call your pharmacy.   Testing/Procedures: Your physician has  requested that you have a cardiac catheterization. Cardiac catheterization is used to diagnose and/or treat various heart conditions. Doctors may recommend this procedure for a number of different reasons. The most common reason is to evaluate chest pain. Chest pain can be a symptom of coronary artery disease (CAD), and cardiac catheterization can show whether plaque is narrowing or blocking your hearts arteries. This procedure is also used to evaluate the valves, as well as measure the blood flow and oxygen levels in different parts of your heart. For further information please visit HugeFiesta.[email or web address]. Please follow instruction sheet, as given.   Follow-Up:  Follow up 2-4 weeks after the cardiac catheterization with Dr. Sallyanne Kuster. We will call to set this appointment up  Any Other Special Instructions Will Be Listed Below (If Applicable).    [place] [address] Dept: [phone] Loc: [place]  04/08/2019  You are scheduled for a Cardiac Catheterization on Monday, July 27 with Dr. Harrell Gave End.  1. Please arrive at the [facility] (Main Entrance A) at [facility] [place], [place] [address] at 5:30 AM (This time is two hours before your procedure to ensure your preparation). Free valet parking service is available.   Special note: Every effort is made to have your procedure done on time. Please understand that emergencies sometimes delay scheduled procedures.  2. Diet: Do not eat solid foods after midnight.  The patient may have clear liquids until 5am upon the day of the procedure  3. Labs: Your provider would like for you to return on Thursday 04/09/2019 to have the following labs drawn: CBC, BMET and PT/INR. You do not  need an appointment for the lab. Once in our office lobby there is a podium where you can sign in and ring  the doorbell to alert [place] that you are here. The lab is open from 8:00 am to 4:30 pm; closed for lunch from 12:45pm-1:45pm.  You will need to have the coronavirus test completed prior to your procedure. An appointment has been made at 1:50pm on 04/09/2019. This will be completed at [place]. This is a drive thru test only. Please make sure to have all other labs completed before this test because you will need to stay quarantined until your procedure.   4. Medication instructions in preparation for your procedure: Hold Metformin the morning of the procedure and then 48 hours after Hold the Lisinopril the morning of the cardiac cath STOP the warfarin starting today, 04/08/2019, until told to resume.   On the morning of your procedure, take your 325 mg Aspirin and Plavix/Clopidogrel and any morning medicines NOT listed above.  You may use sips of water.  5. Plan for one night stay--bring personal belongings. 6. Bring a current list of your medications and current insurance cards. 7. You MUST have a responsible person to drive you home. 8. Someone MUST be with you the first 24 hours after you arrive home or your discharge will be delayed. 9. Please wear clothes that are easy to get on and off and wear slip-on shoes.  Thank you for allowing [place] to care for you!   -- [place] [facility]       Follow Up:  In Person 3 months  Signed, [name], MD  04/08/2019 9:31 AM    [place]

## 2019-04-08 NOTE — Patient Instructions (Addendum)
Medication Instructions:  STOP the Dofetilide (Tikosyn) INCREASE the Isosorbide (Imdur) to 90 mg once daily  If you need a refill on your cardiac medications before your next appointment, please call your pharmacy.   Testing/Procedures: Your physician has requested that you have a cardiac catheterization. Cardiac catheterization is used to diagnose and/or treat various heart conditions. Doctors may recommend this procedure for a number of different reasons. The most common reason is to evaluate chest pain. Chest pain can be a symptom of coronary artery disease (CAD), and cardiac catheterization can show whether plaque is narrowing or blocking your heart's arteries. This procedure is also used to evaluate the valves, as well as measure the blood flow and oxygen levels in different parts of your heart. For further information please visit HugeFiesta.[email or web address]. Please follow instruction sheet, as given.   Follow-Up: . Follow up 2-4 weeks after the cardiac catheterization with Dr. Sallyanne Kuster. We will call to set this appointment up  Any Other Special Instructions Will Be Listed Below (If Applicable).    [place] [address] Dept: [phone] Loc: [place]  04/08/2019  You are scheduled for a Cardiac Catheterization on Monday, July 27 with Dr. Harrell Gave End.  1. Please arrive at the [facility] (Main Entrance A) at [facility]: [place], [place] [address] at 5:30 AM (This time is two hours before your procedure to ensure your preparation). Free valet parking service is available.   Special note: Every effort is made to have your procedure done on time. Please understand that emergencies sometimes delay scheduled procedures.  2. Diet: Do not eat solid foods after midnight.  The patient may have clear liquids until 5am upon the day of the  procedure  3. Labs: Your provider would like for you to return on Thursday 04/09/2019 to have the following labs drawn: Lipids, A1C, CBC, BMET and PT/INR. You do not need an appointment for the lab. Once in our office lobby there is a podium where you can sign in and ring the doorbell to alert [place] that you are here. The lab is open from 8:00 am to 4:30 pm; closed for lunch from 12:45pm-1:45pm.  You will need to have the coronavirus test completed prior to your procedure. An appointment has been made at 1:50pm on 04/09/2019. This will be completed at [place]. This is a drive thru test only. Please make sure to have all other labs completed before this test because you will need to stay quarantined until your procedure.   4. Medication instructions in preparation for your procedure: Hold Metformin the morning of the procedure and then 48 hours after Hold the Lisinopril the morning of the cardiac cath STOP the warfarin starting today, 04/08/2019, until told to resume.   On the morning of your procedure, take your 325 mg Aspirin and Plavix/Clopidogrel and any morning medicines NOT listed above.  You may use sips of water.  5. Plan for one night stay--bring personal belongings. 6. Bring a current list of your medications and current insurance cards. 7. You MUST have a responsible person to drive you home. 8. Someone MUST be with you the first 24 hours after you arrive home or your discharge will be delayed. 9. Please wear clothes that are easy to get on and off and wear slip-on shoes.  Thank you for allowing [place] to care for you!   --  [place] [facility]

## 2019-04-09 ENCOUNTER — Other Ambulatory Visit: Payer: Self-pay

## 2019-04-09 ENCOUNTER — Telehealth: Payer: Self-pay | Admitting: *Deleted

## 2019-04-09 ENCOUNTER — Other Ambulatory Visit (HOSPITAL_COMMUNITY)
Admission: RE | Admit: 2019-04-09 | Discharge: 2019-04-09 | Disposition: A | Discharge status: Home / Self Care | Payer: BC Managed Care – PPO | Source: Ambulatory Visit | Attending: Internal Medicine | Admitting: Internal Medicine

## 2019-04-09 DIAGNOSIS — Z1159 Encounter for screening for other viral diseases: Secondary | ICD-10-CM | POA: Diagnosis not present

## 2019-04-09 DIAGNOSIS — Z7901 Long term (current) use of anticoagulants: Secondary | ICD-10-CM

## 2019-04-09 DIAGNOSIS — E119 Type 2 diabetes mellitus without complications: Secondary | ICD-10-CM

## 2019-04-09 DIAGNOSIS — I25110 Atherosclerotic heart disease of native coronary artery with unstable angina pectoris: Secondary | ICD-10-CM

## 2019-04-09 DIAGNOSIS — E785 Hyperlipidemia, unspecified: Secondary | ICD-10-CM

## 2019-04-09 DIAGNOSIS — Z01818 Encounter for other preprocedural examination: Secondary | ICD-10-CM

## 2019-04-09 LAB — SARS CORONAVIRUS 2 (TAT 6-24 HRS): SARS Coronavirus 2: NEGATIVE

## 2019-04-09 NOTE — Telephone Encounter (Addendum)
Pt contacted pre-catheterization scheduled at [facility] for: Monday April 13, 2019 7:30 AM Verified arrival time and place: [place] Entrance A at: 5:30 AM  Covid-19 test date: 04/09/19  No solid food after midnight prior to cath, clear liquids until 5 AM day of procedure. Contrast allergy: no  Hold: Coumadin-04/08/19 until post procedure. Metformin-day of procedure and 48 hours post procedure. Lisinopril -day of procedure per Dr Sallyanne Kuster  Except hold medications AM meds can be  taken pre-cath with sip of water including: ASA 325 mg per Dr Sallyanne Kuster Plavix 75 mg  Confirm patient has responsible person to drive home post procedure and observe 24 hours after arriving home-yes  Due to Covid-19 pandemic, only one support person will be allowed with patient. Must be the same support person for that patient's entire stay. On arrival the support person will be required to wear a mask and will be screened, including having temporal temperature checked (below 100.4 to be cleared). They will be required to wait in [facility] waiting room for the duration of the procedure. To limit exposure, MDs will continue to call support person after the procedure instead of speaking with them in consult room.   Patients are required to wear a mask when they enter the hospital.       COVID-19 Pre-Screening Questions:  . In the past 7 to 10 days have you had a cough,  shortness of breath, headache, congestion, fever (100 or greater) body aches, chills, sore throat, or sudden loss of taste or sense of smell? no . Have you been around anyone with known Covid 19? no . Have you been around anyone who is awaiting Covid 19 test results in the past 7 to 10 days? no . Have you been around anyone who has been exposed to Covid 19, or has mentioned symptoms of Covid 19 within the past 7 to 10 days? no   I reviewed procedure/mask/visitor, Covid-19 screening questions with patient's wife Morey Hummingbird  (DPR),she verbalized understanding, thanked me call.

## 2019-04-10 LAB — LIPID PANEL
Chol/HDL Ratio: 2.9 ratio (ref 0.0–5.0)
Cholesterol, Total: 117 mg/dL (ref 100–199)
HDL: 40 mg/dL (ref >39)
LDL Calculated: 26 mg/dL (ref 0–99)
Triglycerides: 253 mg/dL — ABNORMAL HIGH (ref 0–149)
VLDL Cholesterol Cal: 51 mg/dL — ABNORMAL HIGH (ref 5–40)

## 2019-04-10 LAB — BASIC METABOLIC PANEL
BUN/Creatinine Ratio: 19 (ref 10–24)
BUN: 16 mg/dL (ref 8–27)
CO2: 22 mmol/L (ref 20–29)
Calcium: 9.8 mg/dL (ref 8.6–10.2)
Chloride: 101 mmol/L (ref 96–106)
Creatinine, Ser: 0.85 mg/dL (ref 0.76–1.27)
GFR calc Af Amer: 107 mL/min/{1.73_m2} (ref >59)
GFR calc non Af Amer: 93 mL/min/{1.73_m2} (ref >59)
Glucose: 158 mg/dL — ABNORMAL HIGH (ref 65–99)
Potassium: 4 mmol/L (ref 3.5–5.2)
Sodium: 139 mmol/L (ref 134–144)

## 2019-04-10 LAB — CBC
Hematocrit: 41.6 % (ref 37.5–51.0)
Hemoglobin: 14 g/dL (ref 13.0–17.7)
MCH: 28 pg (ref 26.6–33.0)
MCHC: 33.7 g/dL (ref 31.5–35.7)
MCV: 83 fL (ref 79–97)
Platelets: 238 10*3/uL (ref 150–450)
RBC: 5 x10E6/uL (ref 4.14–5.80)
RDW: 14 % (ref 11.6–15.4)
WBC: 8.7 10*3/uL (ref 3.4–10.8)

## 2019-04-10 LAB — HEMOGLOBIN A1C
Est. average glucose Bld gHb Est-mCnc: 151 mg/dL
Hgb A1c MFr Bld: 6.9 % — ABNORMAL HIGH (ref 4.8–5.6)

## 2019-04-10 LAB — PROTIME-INR
INR: 2.1 — ABNORMAL HIGH (ref 0.8–1.2)
Prothrombin Time: 22 s — ABNORMAL HIGH (ref 9.1–12.0)

## 2019-04-13 ENCOUNTER — Encounter (HOSPITAL_COMMUNITY): Payer: Self-pay | Admitting: *Deleted

## 2019-04-13 ENCOUNTER — Other Ambulatory Visit: Payer: Self-pay

## 2019-04-13 ENCOUNTER — Ambulatory Visit (HOSPITAL_COMMUNITY)
Admission: RE | Disposition: A | Discharge status: Home / Self Care | Payer: BC Managed Care – PPO | Source: Home / Self Care | Attending: Internal Medicine

## 2019-04-13 ENCOUNTER — Ambulatory Visit (HOSPITAL_COMMUNITY)
Admission: RE | Admit: 2019-04-13 | Discharge: 2019-04-14 | Disposition: A | Discharge status: Home / Self Care | Payer: BC Managed Care – PPO | Attending: Internal Medicine | Admitting: Internal Medicine

## 2019-04-13 ENCOUNTER — Other Ambulatory Visit: Payer: Self-pay | Admitting: Cardiovascular Disease

## 2019-04-13 DIAGNOSIS — Z950 Presence of cardiac pacemaker: Secondary | ICD-10-CM

## 2019-04-13 DIAGNOSIS — R55 Syncope and collapse: Secondary | ICD-10-CM | POA: Diagnosis not present

## 2019-04-13 DIAGNOSIS — I110 Hypertensive heart disease with heart failure: Secondary | ICD-10-CM | POA: Diagnosis not present

## 2019-04-13 DIAGNOSIS — I5030 Unspecified diastolic (congestive) heart failure: Secondary | ICD-10-CM | POA: Diagnosis not present

## 2019-04-13 DIAGNOSIS — Z7984 Long term (current) use of oral hypoglycemic drugs: Secondary | ICD-10-CM | POA: Diagnosis not present

## 2019-04-13 DIAGNOSIS — Z955 Presence of coronary angioplasty implant and graft: Secondary | ICD-10-CM | POA: Diagnosis not present

## 2019-04-13 DIAGNOSIS — Z7901 Long term (current) use of anticoagulants: Secondary | ICD-10-CM | POA: Diagnosis not present

## 2019-04-13 DIAGNOSIS — E119 Type 2 diabetes mellitus without complications: Secondary | ICD-10-CM

## 2019-04-13 DIAGNOSIS — Z8249 Family history of ischemic heart disease and other diseases of the circulatory system: Secondary | ICD-10-CM | POA: Insufficient documentation

## 2019-04-13 DIAGNOSIS — E785 Hyperlipidemia, unspecified: Secondary | ICD-10-CM | POA: Diagnosis not present

## 2019-04-13 DIAGNOSIS — I25710 Atherosclerosis of autologous vein coronary artery bypass graft(s) with unstable angina pectoris: Secondary | ICD-10-CM | POA: Diagnosis not present

## 2019-04-13 DIAGNOSIS — I25110 Atherosclerotic heart disease of native coronary artery with unstable angina pectoris: Secondary | ICD-10-CM | POA: Diagnosis not present

## 2019-04-13 DIAGNOSIS — Z7902 Long term (current) use of antithrombotics/antiplatelets: Secondary | ICD-10-CM | POA: Insufficient documentation

## 2019-04-13 DIAGNOSIS — I10 Essential (primary) hypertension: Secondary | ICD-10-CM | POA: Diagnosis present

## 2019-04-13 DIAGNOSIS — I495 Sick sinus syndrome: Secondary | ICD-10-CM | POA: Insufficient documentation

## 2019-04-13 DIAGNOSIS — Z951 Presence of aortocoronary bypass graft: Secondary | ICD-10-CM | POA: Insufficient documentation

## 2019-04-13 DIAGNOSIS — I200 Unstable angina: Secondary | ICD-10-CM | POA: Diagnosis present

## 2019-04-13 DIAGNOSIS — Z833 Family history of diabetes mellitus: Secondary | ICD-10-CM | POA: Insufficient documentation

## 2019-04-13 DIAGNOSIS — I480 Paroxysmal atrial fibrillation: Secondary | ICD-10-CM | POA: Diagnosis not present

## 2019-04-13 DIAGNOSIS — E669 Obesity, unspecified: Secondary | ICD-10-CM | POA: Diagnosis not present

## 2019-04-13 DIAGNOSIS — Z823 Family history of stroke: Secondary | ICD-10-CM | POA: Insufficient documentation

## 2019-04-13 DIAGNOSIS — Z79899 Other long term (current) drug therapy: Secondary | ICD-10-CM | POA: Insufficient documentation

## 2019-04-13 DIAGNOSIS — Z87891 Personal history of nicotine dependence: Secondary | ICD-10-CM | POA: Insufficient documentation

## 2019-04-13 DIAGNOSIS — K219 Gastro-esophageal reflux disease without esophagitis: Secondary | ICD-10-CM | POA: Diagnosis not present

## 2019-04-13 HISTORY — PX: CORONARY STENT INTERVENTION: CATH118234

## 2019-04-13 HISTORY — PX: LEFT HEART CATH AND CORS/GRAFTS ANGIOGRAPHY: CATH118250

## 2019-04-13 LAB — GLUCOSE, CAPILLARY
Glucose-Capillary: 153 mg/dL — ABNORMAL HIGH (ref 70–99)
Glucose-Capillary: 143 mg/dL — ABNORMAL HIGH (ref 70–99)
Glucose-Capillary: 159 mg/dL — ABNORMAL HIGH (ref 70–99)
Glucose-Capillary: 105 mg/dL — ABNORMAL HIGH (ref 70–99)

## 2019-04-13 LAB — CBC
HCT: 40.5 % (ref 39.0–52.0)
Hemoglobin: 13.2 g/dL (ref 13.0–17.0)
MCH: 28.3 pg (ref 26.0–34.0)
MCHC: 32.6 g/dL (ref 30.0–36.0)
MCV: 86.9 fL (ref 80.0–100.0)
Platelets: 194 10*3/uL (ref 150–400)
RBC: 4.66 MIL/uL (ref 4.22–5.81)
RDW: 13.3 % (ref 11.5–15.5)
WBC: 7.9 10*3/uL (ref 4.0–10.5)
nRBC: 0 % (ref 0.0–0.2)

## 2019-04-13 LAB — CREATININE, SERUM
Creatinine, Ser: 0.84 mg/dL (ref 0.61–1.24)
GFR calc Af Amer: >60 mL/min (ref >60)
GFR calc non Af Amer: >60 mL/min (ref >60)

## 2019-04-13 LAB — PROTIME-INR
INR: 1.2 (ref 0.8–1.2)
Prothrombin Time: 15.4 seconds — ABNORMAL HIGH (ref 11.4–15.2)

## 2019-04-13 LAB — POCT ACTIVATED CLOTTING TIME
Activated Clotting Time: 753 seconds
Activated Clotting Time: 648 seconds

## 2019-04-13 SURGERY — LEFT HEART CATH AND CORS/GRAFTS ANGIOGRAPHY
Anesthesia: LOCAL

## 2019-04-13 MED ORDER — SODIUM CHLORIDE 0.9% FLUSH
3.0000 mL | INTRAVENOUS | Status: DC | PRN
  Administered 2019-04-14: 3 mL via INTRAVENOUS
  Filled 2019-04-13: qty 3

## 2019-04-13 MED ORDER — SODIUM CHLORIDE 0.9 % IV SOLN: 250.0000 mL | INTRAVENOUS | Status: DC | PRN

## 2019-04-13 MED ORDER — BIVALIRUDIN BOLUS VIA INFUSION - CUPID
INTRAVENOUS | Status: DC | PRN
  Administered 2019-04-13: 74.85 mg via INTRAVENOUS

## 2019-04-13 MED ORDER — OMEGA-3-ACID ETHYL ESTERS 1 G PO CAPS
2.0000 g | ORAL_CAPSULE | Freq: Every day | ORAL | Status: DC
  Administered 2019-04-13 – 2019-04-14 (×2): 2 g via ORAL
  Filled 2019-04-13 (×2): qty 2

## 2019-04-13 MED ORDER — HEPARIN SODIUM (PORCINE) 1000 UNIT/ML IJ SOLN
INTRAMUSCULAR | Status: AC
  Filled 2019-04-13: qty 1

## 2019-04-13 MED ORDER — LIDOCAINE HCL (PF) 1 % IJ SOLN
INTRAMUSCULAR | Status: AC
  Filled 2019-04-13: qty 30

## 2019-04-13 MED ORDER — ONDANSETRON HCL 4 MG/2ML IJ SOLN: 4.0000 mg | Freq: Four times a day (QID) | INTRAMUSCULAR | Status: DC | PRN

## 2019-04-13 MED ORDER — IOHEXOL 350 MG/ML SOLN
INTRAVENOUS | Status: DC | PRN
  Administered 2019-04-13: 140 mL via INTRA_ARTERIAL

## 2019-04-13 MED ORDER — NITROGLYCERIN 1 MG/10 ML FOR IR/CATH LAB
INTRA_ARTERIAL | Status: DC | PRN
  Administered 2019-04-13: 200 ug via INTRACORONARY

## 2019-04-13 MED ORDER — HEPARIN (PORCINE) IN NACL 1000-0.9 UT/500ML-% IV SOLN
INTRAVENOUS | Status: AC
  Filled 2019-04-13: qty 1000

## 2019-04-13 MED ORDER — SODIUM CHLORIDE 0.9% FLUSH: 3.0000 mL | Freq: Two times a day (BID) | INTRAVENOUS | Status: DC

## 2019-04-13 MED ORDER — INSULIN ASPART 100 UNIT/ML ~~LOC~~ SOLN: 0.0000 [IU] | Freq: Three times a day (TID) | SUBCUTANEOUS | Status: DC

## 2019-04-13 MED ORDER — ASPIRIN 81 MG PO CHEW
81.0000 mg | CHEWABLE_TABLET | Freq: Every day | ORAL | Status: DC
  Administered 2019-04-14: 81 mg via ORAL
  Filled 2019-04-13: qty 1

## 2019-04-13 MED ORDER — ENOXAPARIN SODIUM 40 MG/0.4ML ~~LOC~~ SOLN: 40.0000 mg | SUBCUTANEOUS | Status: DC

## 2019-04-13 MED ORDER — ANGIOPLASTY BOOK
Freq: Once | Status: AC
  Administered 2019-04-14: 05:00:00
  Filled 2019-04-13: qty 1

## 2019-04-13 MED ORDER — INSULIN ASPART 100 UNIT/ML ~~LOC~~ SOLN: 0.0000 [IU] | Freq: Every day | SUBCUTANEOUS | Status: DC

## 2019-04-13 MED ORDER — MIDAZOLAM HCL 2 MG/2ML IJ SOLN
INTRAMUSCULAR | Status: DC | PRN
  Administered 2019-04-13 (×2): 1 mg via INTRAVENOUS

## 2019-04-13 MED ORDER — WARFARIN SODIUM 5 MG PO TABS
5.0000 mg | ORAL_TABLET | Freq: Once | ORAL | Status: AC
  Administered 2019-04-13: 18:00:00 5 mg via ORAL
  Filled 2019-04-13: qty 1

## 2019-04-13 MED ORDER — ATENOLOL 25 MG PO TABS
100.0000 mg | ORAL_TABLET | Freq: Every day | ORAL | Status: DC
  Administered 2019-04-14: 10:00:00 100 mg via ORAL
  Filled 2019-04-13: qty 4

## 2019-04-13 MED ORDER — DILTIAZEM HCL ER 180 MG PO CP24
180.0000 mg | ORAL_CAPSULE | Freq: Every day | ORAL | Status: DC
  Filled 2019-04-13: qty 1

## 2019-04-13 MED ORDER — SODIUM CHLORIDE 0.9 % WEIGHT BASED INFUSION
3.0000 mL/kg/h | INTRAVENOUS | Status: DC
  Administered 2019-04-13: 3 mL/kg/h via INTRAVENOUS

## 2019-04-13 MED ORDER — LIDOCAINE HCL (PF) 1 % IJ SOLN
INTRAMUSCULAR | Status: DC | PRN
  Administered 2019-04-13: 15 mL

## 2019-04-13 MED ORDER — ACETAMINOPHEN 325 MG PO TABS
650.0000 mg | ORAL_TABLET | ORAL | Status: DC | PRN
  Administered 2019-04-13 – 2019-04-14 (×2): 650 mg via ORAL
  Filled 2019-04-13 (×2): qty 2

## 2019-04-13 MED ORDER — HYDRALAZINE HCL 20 MG/ML IJ SOLN: 10.0000 mg | INTRAMUSCULAR | Status: AC | PRN

## 2019-04-13 MED ORDER — CLOPIDOGREL BISULFATE 300 MG PO TABS
ORAL_TABLET | ORAL | Status: AC
  Filled 2019-04-13: qty 1

## 2019-04-13 MED ORDER — ROSUVASTATIN CALCIUM 20 MG PO TABS
40.0000 mg | ORAL_TABLET | Freq: Every day | ORAL | Status: DC
  Administered 2019-04-13: 40 mg via ORAL
  Filled 2019-04-13: qty 2

## 2019-04-13 MED ORDER — FENTANYL CITRATE (PF) 100 MCG/2ML IJ SOLN
INTRAMUSCULAR | Status: DC | PRN
  Administered 2019-04-13: 50 ug via INTRAVENOUS
  Administered 2019-04-13 (×2): 25 ug via INTRAVENOUS

## 2019-04-13 MED ORDER — ASPIRIN 81 MG PO CHEW: 81.0000 mg | CHEWABLE_TABLET | ORAL | Status: DC

## 2019-04-13 MED ORDER — CLOPIDOGREL BISULFATE 75 MG PO TABS
75.0000 mg | ORAL_TABLET | Freq: Every day | ORAL | Status: DC
  Administered 2019-04-14: 75 mg via ORAL
  Filled 2019-04-13: qty 1

## 2019-04-13 MED ORDER — VENLAFAXINE HCL ER 75 MG PO CP24
225.0000 mg | ORAL_CAPSULE | Freq: Every day | ORAL | Status: DC
  Administered 2019-04-14: 225 mg via ORAL
  Filled 2019-04-13: qty 1

## 2019-04-13 MED ORDER — WARFARIN - PHARMACIST DOSING INPATIENT: Freq: Every day | Status: DC

## 2019-04-13 MED ORDER — SODIUM CHLORIDE 0.9% FLUSH: 3.0000 mL | INTRAVENOUS | Status: DC | PRN

## 2019-04-13 MED ORDER — DILTIAZEM HCL ER COATED BEADS 180 MG PO CP24
180.0000 mg | ORAL_CAPSULE | Freq: Every day | ORAL | Status: DC
  Administered 2019-04-13: 180 mg via ORAL
  Filled 2019-04-13: qty 1

## 2019-04-13 MED ORDER — HEPARIN (PORCINE) IN NACL 1000-0.9 UT/500ML-% IV SOLN
INTRAVENOUS | Status: DC | PRN
  Administered 2019-04-13 (×2): 500 mL

## 2019-04-13 MED ORDER — SODIUM CHLORIDE 0.9 % IV SOLN
INTRAVENOUS | Status: DC | PRN
  Administered 2019-04-13 (×2): 1.75 mg/kg/h via INTRAVENOUS

## 2019-04-13 MED ORDER — DOXYLAMINE SUCCINATE (SLEEP) 25 MG PO TABS
25.0000 mg | ORAL_TABLET | Freq: Every evening | ORAL | Status: DC | PRN
  Filled 2019-04-13: qty 1

## 2019-04-13 MED ORDER — ISOSORBIDE MONONITRATE ER 60 MG PO TB24
90.0000 mg | ORAL_TABLET | Freq: Every day | ORAL | Status: DC
  Administered 2019-04-13: 90 mg via ORAL
  Filled 2019-04-13: qty 1

## 2019-04-13 MED ORDER — SODIUM CHLORIDE 0.9% FLUSH
3.0000 mL | Freq: Two times a day (BID) | INTRAVENOUS | Status: DC
  Administered 2019-04-13 – 2019-04-14 (×2): 3 mL via INTRAVENOUS

## 2019-04-13 MED ORDER — NITROGLYCERIN 1 MG/10 ML FOR IR/CATH LAB
INTRA_ARTERIAL | Status: AC
  Filled 2019-04-13: qty 10

## 2019-04-13 MED ORDER — CLOPIDOGREL BISULFATE 300 MG PO TABS
ORAL_TABLET | ORAL | Status: DC | PRN
  Administered 2019-04-13: 300 mg via ORAL

## 2019-04-13 MED ORDER — LABETALOL HCL 5 MG/ML IV SOLN: 10.0000 mg | INTRAVENOUS | Status: AC | PRN

## 2019-04-13 MED ORDER — BIVALIRUDIN TRIFLUOROACETATE 250 MG IV SOLR
INTRAVENOUS | Status: AC
  Filled 2019-04-13: qty 250

## 2019-04-13 MED ORDER — MIDAZOLAM HCL 2 MG/2ML IJ SOLN
INTRAMUSCULAR | Status: AC
  Filled 2019-04-13: qty 2

## 2019-04-13 MED ORDER — FENTANYL CITRATE (PF) 100 MCG/2ML IJ SOLN
INTRAMUSCULAR | Status: AC
  Filled 2019-04-13: qty 2

## 2019-04-13 MED ORDER — CLONAZEPAM 0.5 MG PO TABS
0.5000 mg | ORAL_TABLET | Freq: Two times a day (BID) | ORAL | Status: DC | PRN
  Administered 2019-04-13: 18:00:00 0.5 mg via ORAL
  Filled 2019-04-13: qty 1

## 2019-04-13 MED ORDER — SODIUM CHLORIDE 0.9 % WEIGHT BASED INFUSION: 1.0000 mL/kg/h | INTRAVENOUS | Status: DC

## 2019-04-13 SURGICAL SUPPLY — 25 items
BALLN SAPPHIRE 2.0X12 (BALLOONS) ×2
BALLN SAPPHIRE 2.5X10 (BALLOONS) ×2
BALLN SAPPHIRE ~~LOC~~ 2.75X12 (BALLOONS) ×1 IMPLANT
BALLOON SAPPHIRE 2.0X12 (BALLOONS) IMPLANT
BALLOON SAPPHIRE 2.5X10 (BALLOONS) IMPLANT
CATH INFINITI 5FR MULTPACK ANG (CATHETERS) ×1 IMPLANT
CATH LAUNCHER 6FR AL1 (CATHETERS) IMPLANT
CATH LAUNCHER 6FR EBU 3.75 (CATHETERS) ×1 IMPLANT
CATHETER LAUNCHER 6FR AL1 (CATHETERS) ×2
CATHETER LAUNCHER 6FR LCB (CATHETERS) ×1 IMPLANT
COVER DOME SNAP 22 D (MISCELLANEOUS) ×1 IMPLANT
DEVICE SPIDERFX EMB PROT 5MM (WIRE) ×1 IMPLANT
KIT ENCORE 26 ADVANTAGE (KITS) ×1 IMPLANT
KIT HEART LEFT (KITS) ×2 IMPLANT
KIT MICROPUNCTURE NIT STIFF (SHEATH) ×1 IMPLANT
PACK CARDIAC CATHETERIZATION (CUSTOM PROCEDURE TRAY) ×2 IMPLANT
SHEATH PINNACLE 5F 10CM (SHEATH) IMPLANT
SHEATH PINNACLE 6F 10CM (SHEATH) ×1 IMPLANT
SHEATH PROBE COVER 6X72 (BAG) ×1 IMPLANT
STENT RESOLUTE ONYX 2.5X18 (Permanent Stent) ×1 IMPLANT
STENT RESOLUTE ONYX 3.0X12 (Permanent Stent) ×1 IMPLANT
TRANSDUCER W/STOPCOCK (MISCELLANEOUS) ×2 IMPLANT
TUBING CIL FLEX 10 FLL-RA (TUBING) ×2 IMPLANT
WIRE EMERALD 3MM-J .035X150CM (WIRE) ×1 IMPLANT
WIRE RUNTHROUGH .014X180CM (WIRE) ×1 IMPLANT

## 2019-04-13 NOTE — Progress Notes (Signed)
Site area: rt groin fa sheath Site Prior to Removal:  Level 0 Pressure Applied For:  20 minutes Manual:   yes Patient Status During Pull:  stable Post Pull Site:  Level 0 Post Pull Instructions Given:  yes Post Pull Pulses Present: rt dp palpable Dressing Applied:  Gauze and tegaderm Bedrest begins @ 1130 Comments:

## 2019-04-13 NOTE — Progress Notes (Signed)
ANTICOAGULATION CONSULT NOTE - Initial Consult  Pharmacy Consult for Warfarin Indication: atrial fibrillation  No Known Allergies  Patient Measurements: Height: 5\' 10"  (177.8 cm) Weight: 220 lb (99.8 kg) IBW/kg (Calculated) : 73  Vital Signs: Temp: 97.9 F (36.6 C) (07/27 1155) Temp Source: Oral (07/27 1155) BP: 128/78 (07/27 1130) Pulse Rate: 60 (07/27 1155)  Labs: Recent Labs    04/13/19 0554  LABPROT 15.4*  INR 1.2    Estimated Creatinine Clearance: 105.3 mL/min (by C-G formula based on SCr of 0.85 mg/dL).   Medical History: Past Medical History:  Diagnosis Date  . Anxiety   . Atrial fibrillation (HCC)    paroxysmal  . Atrial flutter ([place])   . Coagulopathy ([place])    persistent elevation of ACT during 10/17/11 hospitalization  . Coronary artery disease    s/p CABG 2010, s/p Successful PCI  10/17/11 of Distal Left Main and the Left Circumflex with a Promus Element DES stent -- 4.0 mm x 16 mm postdilated to 4.12 mm   . DDD (degenerative disc disease)   . Depression   . Dysrhythmia    afib  . Facial asymmetry 2010   pt. stated it was very temporary, never seen by doctor for it.   Marland Kitchen GERD (gastroesophageal reflux disease)   . Heart murmur   . Hyperlipidemia   . Hypertension   . Kidney stones   . Presence of permanent cardiac pacemaker    Medtronic   . S/P coronary artery stent placement to distal Lt. main and Lt. Cx 10/17/11  . 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 The patient was on warfarin PTA for hx Afib - held for the procedure with plans to resume this evening without a bridge. Pharmacy consulted to dose.   INR today is 1.2. Last known PTA dose was 2.5 mg daily EXCEPT for 5 mg on Tues/Thurs/Sun.   Goal of Therapy:  INR 2-3 Monitor platelets by anticoagulation protocol: Yes   Plan:  - Warfarin 5 mg x 1 dose at 1800  today - Will continue to monitor for any signs/symptoms of bleeding and will follow up with PT/INR in the a.m.   Thank you for allowing pharmacy to be a part of this patient's care.  Alycia Rossetti, PharmD, BCPS Clinical Pharmacist Clinical phone for 04/13/2019: [phone] 04/13/2019 12:57 PM   **Pharmacist phone directory can now be found on [email or web address] ([email or web address]).  Listed under [place].

## 2019-04-13 NOTE — Brief Op Note (Signed)
BRIEF CARDIAC CATHETERIZATION NOTE  04/13/2019  9:17 AM  PATIENT:  Lucas Porter  63 y.o. male  PRE-OPERATIVE DIAGNOSIS:  Unstable angina  POST-OPERATIVE DIAGNOSIS:  Same  PROCEDURE:  Procedure(s): LEFT HEART CATH AND CORS/GRAFTS ANGIOGRAPHY (N/A) CORONARY STENT INTERVENTION (N/A)  SURGEON:  Surgeon(s) and Role:    * Kalyse Meharg, MD - Primary  FINDINGS: 1. Multivessel CAD (as detailed on diagnostic catheterization from 02/20/2019), including 80% SVG-D stenosis, 50-60% proximal LCx disease, and 80-90% distal LCx stenosis. 2. Moderately elevated left ventricular filling pressure. 3. Successful PCI to SVG-D using Resolute Onyx 3.0 x 12 mm DES with 0% residual stenosis. 4. Successful PCI to distal LCx using Resolute Onyx 2.5 x 18 mm DES with 0% residual stenosis.  RECOMMENDATIONS: 1. Restart warfarin tonight.  Recommend ASA, clopidogrel, and warfarin x 1 month, after which time ASA can be discontinued. 2. Consider gentle diuresis later today or tomorrow, as renal function allows, given moderately elevated LVEDP. 3. Aggressive secondary prevention. 4. Overnight extended recovery; anticipate d/c home tomorrow.  Nelva Bush, MD [facility] Pager: [phone]

## 2019-04-13 NOTE — Interval H&P Note (Signed)
History and Physical Interval Note:  04/13/2019 7:29 AM  Lucas Porter  has presented today for cardiac catheterization, with the diagnosis of unstable angina.  The various methods of treatment have been discussed with the patient and family. After consideration of risks, benefits and other options for treatment, the patient has consented to  Procedure(s): LEFT HEART CATH AND CORS/GRAFTS ANGIOGRAPHY (N/A) as a surgical intervention.  The patient's history has been reviewed, patient examined, no change in status, stable for surgery.  I have reviewed the patient's chart and labs.  Questions were answered to the patient's satisfaction.    Cath Lab Visit (complete for each Cath Lab visit)  Clinical Evaluation Leading to the Procedure:   ACS: No.  Non-ACS:    Anginal Classification: CCS IV  Anti-ischemic medical therapy: Maximal Therapy (2 or more classes of medications)  Non-Invasive Test Results: No non-invasive testing performed  Prior CABG: Previous CABG  Lucas Porter

## 2019-04-14 ENCOUNTER — Other Ambulatory Visit: Payer: Self-pay

## 2019-04-14 ENCOUNTER — Telehealth: Payer: Self-pay | Admitting: Cardiovascular Disease

## 2019-04-14 ENCOUNTER — Encounter (HOSPITAL_COMMUNITY): Payer: Self-pay | Admitting: *Deleted

## 2019-04-14 ENCOUNTER — Other Ambulatory Visit: Payer: Self-pay | Admitting: Cardiovascular Disease

## 2019-04-14 DIAGNOSIS — I10 Essential (primary) hypertension: Secondary | ICD-10-CM | POA: Diagnosis not present

## 2019-04-14 DIAGNOSIS — Z7901 Long term (current) use of anticoagulants: Secondary | ICD-10-CM

## 2019-04-14 DIAGNOSIS — I110 Hypertensive heart disease with heart failure: Secondary | ICD-10-CM | POA: Diagnosis not present

## 2019-04-14 DIAGNOSIS — E119 Type 2 diabetes mellitus without complications: Secondary | ICD-10-CM

## 2019-04-14 DIAGNOSIS — Z950 Presence of cardiac pacemaker: Secondary | ICD-10-CM | POA: Diagnosis not present

## 2019-04-14 DIAGNOSIS — Z951 Presence of aortocoronary bypass graft: Secondary | ICD-10-CM | POA: Diagnosis not present

## 2019-04-14 DIAGNOSIS — I25110 Atherosclerotic heart disease of native coronary artery with unstable angina pectoris: Secondary | ICD-10-CM | POA: Diagnosis not present

## 2019-04-14 DIAGNOSIS — I5030 Unspecified diastolic (congestive) heart failure: Secondary | ICD-10-CM | POA: Diagnosis not present

## 2019-04-14 DIAGNOSIS — I480 Paroxysmal atrial fibrillation: Secondary | ICD-10-CM

## 2019-04-14 DIAGNOSIS — R55 Syncope and collapse: Secondary | ICD-10-CM

## 2019-04-14 LAB — BASIC METABOLIC PANEL
Anion gap: 11 (ref 5–15)
BUN: 13 mg/dL (ref 8–23)
CO2: 25 mmol/L (ref 22–32)
Calcium: 9.3 mg/dL (ref 8.9–10.3)
Chloride: 99 mmol/L (ref 98–111)
Creatinine, Ser: 0.82 mg/dL (ref 0.61–1.24)
GFR calc Af Amer: >60 mL/min (ref >60)
GFR calc non Af Amer: >60 mL/min (ref >60)
Glucose, Bld: 204 mg/dL — ABNORMAL HIGH (ref 70–99)
Potassium: 4.2 mmol/L (ref 3.5–5.1)
Sodium: 135 mmol/L (ref 135–145)

## 2019-04-14 LAB — CBC
HCT: 41.8 % (ref 39.0–52.0)
Hemoglobin: 14 g/dL (ref 13.0–17.0)
MCH: 28.6 pg (ref 26.0–34.0)
MCHC: 33.5 g/dL (ref 30.0–36.0)
MCV: 85.3 fL (ref 80.0–100.0)
Platelets: 193 10*3/uL (ref 150–400)
RBC: 4.9 MIL/uL (ref 4.22–5.81)
RDW: 13.1 % (ref 11.5–15.5)
WBC: 9.8 10*3/uL (ref 4.0–10.5)
nRBC: 0 % (ref 0.0–0.2)

## 2019-04-14 LAB — PROTIME-INR
INR: 1.2 (ref 0.8–1.2)
Prothrombin Time: 14.8 seconds (ref 11.4–15.2)

## 2019-04-14 LAB — GLUCOSE, CAPILLARY: Glucose-Capillary: 237 mg/dL — ABNORMAL HIGH (ref 70–99)

## 2019-04-14 MED ORDER — ASPIRIN 81 MG PO CHEW: 81.0000 mg | CHEWABLE_TABLET | Freq: Every day | ORAL | 0 refills | Status: AC

## 2019-04-14 MED ORDER — WARFARIN SODIUM 5 MG PO TABS: 5.0000 mg | ORAL_TABLET | Freq: Once | ORAL | Status: DC

## 2019-04-14 MED FILL — Heparin Sodium (Porcine) Inj 1000 Unit/ML: INTRAMUSCULAR | Qty: 30 | Status: AC

## 2019-04-14 NOTE — Telephone Encounter (Signed)
Phoned to discuss TOC, patient's wife states he's still in the hospital, expecting to be discharged today. Will call again tomorrow. Wife states she would like to change f/u visit with Billey Chang on 04/20/19, will change when speak with patient for toc instructions.

## 2019-04-14 NOTE — Telephone Encounter (Signed)
New Message   Per PA Daleen Snook [employer] scheduled [facility] visit with PA Almyra Deforest on 04/20/2019 at 9:15am

## 2019-04-14 NOTE — Plan of Care (Signed)
OUTCOMES FOR PATIENT COMPLETE AND MET, READY FOR DISCHARGE 

## 2019-04-14 NOTE — Progress Notes (Signed)
ANTICOAGULATION CONSULT NOTE - Follow-Up Consult  Pharmacy Consult for Warfarin Indication: atrial fibrillation  No Known Allergies  Patient Measurements: Height: 5\' 10"  (177.8 cm) Weight: 222 lb 14.2 oz (101.1 kg) IBW/kg (Calculated) : 73  Vital Signs: Temp: 97.7 F (36.5 C) (07/28 0525) Temp Source: Oral (07/28 0525) BP: 159/96 (07/28 0905) Pulse Rate: 80 (07/28 0740)  Labs: Recent Labs    04/13/19 0554 04/13/19 1247 04/14/19 0938  HGB  --  13.2 14.0  HCT  --  40.5 41.8  PLT  --  194 193  LABPROT 15.4*  --  14.8  INR 1.2  --  1.2  CREATININE  --  0.84  --     Estimated Creatinine Clearance: 107.2 mL/min (by C-G formula based on SCr of 0.84 mg/dL).   Assessment: 18 YOM who presented on 7/27 for a planned cardiac cath and is now s/p PCI x 2. The patient was on warfarin PTA for hx Afib - held for the procedure and resumed 7/27 evening without a bridge. Pharmacy consulted to dose.   PTA dose was 2.5 mg daily EXCEPT for 5 mg on Tues/Thurs/Sun.   INR today remains SUBtherapeutic (INR 1.2, goal of 2-3). Noted plans for discharge today.   Goal of Therapy:  INR 2-3 Monitor platelets by anticoagulation protocol: Yes   Plan:  - Repeat Warfarin 5 mg x 1 dose at 1800 today (if still here) - Will continue to monitor for any signs/symptoms of bleeding and will follow up with PT/INR in the a.m. (if still here) - Likely discharge 7/28  Thank you for allowing pharmacy to be a part of this patient's care.  Alycia Rossetti, PharmD, BCPS Clinical Pharmacist Clinical phone for 04/14/2019: [phone] 04/14/2019 10:19 AM   **Pharmacist phone directory can now be found on [email or web address] ([email or web address]).  Listed under [place].

## 2019-04-14 NOTE — Discharge Summary (Addendum)
Discharge Summary    Patient ID: Lucas Porter,  MRN: [id number], DOB/AGE: [birth year] 63 y.o.  Admit date: 04/13/2019 Discharge date: 04/14/2019  Primary Care Provider: Patient, No Pcp Per Primary Cardiologist: Lucas Klein, MD  Discharge Diagnoses    Principal Problem:   Coronary artery disease involving native coronary artery of native heart with unstable angina pectoris [facility]) Active Problems:   Paroxysmal atrial fibrillation (Lucas Porter)   Hypertension   Dyslipidemia   History of pacemaker, with a-v pacing   Diabetes mellitus type 2, uncomplicated (Lucas Porter)   Unstable angina (Lucas Porter)   Allergies No Known Allergies  Diagnostic Studies/Procedures    Left heart cath 04/13/2019: Conclusions: 1. Significant multivessel coronary artery disease, as detailed in Dr. Thompson Caul diagnostic catheterization report from 02/2019, including 80% SVG-D stenosis, 50-60% proximal LCx disease, and 80-90% distal LCx stenosis. 2. Moderately elevated left ventricular filling pressure (LVEDP 25-30 mmHg). 3. Successful PCI to SVG-D using Resolute Onyx 3.0 x 12 mm drug-eluting stent with 0% residual stenosis and TIMI-3 flow. 4. Successful PCI to distal LCx using Resolute Onyx 2.5 x 18 mm drug-eluting stent with 0% residual stenosis ant TIMI-3 flow.  Recommendations: 1. Restart warfarin tonight. Recommend aspirin, clopidogrel, and warfarin x 1 month, after which time aspirin can be discontinued.  Warfarin and clopidogrel should be continued for at least 12 month, ideally longer if tolerated. 2. Consider gentle diuresis later today or tomorrow, as renal function allows, given moderately elevated LVEDP. 3. Aggressive secondary prevention. 4. Overnight extended recovery; anticipate discharge home tomorrow. _____________   History of Present Illness     63 y.o. male with with multiple cardiovascular problems including multivesselCAD s/p CABG 2010, subsequent PCI-DES toLeft main-LCx  2013,tachycardia-bradycardia syndrome with dual-chamber permanent pacemaker (Medtronic), increasing burden of paroxysmal and persistent atrial fibrillation, chronic anticoagulation, essential hypertension, hypercholesterolemia, vasovagal syncope, called with complaints of angina pectoris.  Because of recurrent angina pectoris he underwent elective heart catheterization on February 20, 2019 and was found to have high-grade stenosis in the distal left circumflex coronary artery and a saphenous vein graft to the diagonal artery.  Neither vessel was particularly large and his symptoms appeared to improve after initiation of a long-acting nitrates, so medical therapy was advised, with an option for percutaneous revascularization if the symptoms persisted.  He underwent pacemaker generator change out on the same day.   "Significant coronary disease with ostial occlusion of the native LAD, 40 to 50% in-stent restenosis in the left main to proximal circumflex stent, new 50% proximal circumflex narrowing just distal to the stent, 90% obstruction in the second obtuse marginal, 100% stenosis of the ramus intermedius, 80% stenosis in the distal circumflex, and total occlusion of the native RCA.  Total occlusion of the free radial to the distal right coronary.  Eccentric 40% stenosis in the saphenous vein graft to the ramus intermedius.  Eccentric 70 to 75% stenosis in the proximal graft to the diagonal.  Patent LIMA to LAD.  Dyssynergy of the left ventricle with estimated EF 40 to 50% and hemodynamic evidence of diastolic heart failure with EDP 22 mmHg.  RECOMMENDATIONS:   Symptoms have improved significantly with low-dose isosorbide.  Plan to continue medical therapy and consider intervention on the saphenous vein graft to diagonal and question distal native circumflex if refractory symptoms. We will proceed with device change out today per Dr. Sallyanne Porter."  He did well for a few weeks but on July 8 was  seen in the emergency room for a protracted episode of  angina pectoris that did not resolve after 3 sublingual nitroglycerin tablets.  Cardiac high-sensitivity troponin was minimally elevated and stable at about 45.  The ECG did not show high risk findings.  The isosorbide mononitrate was increased to 60 mg daily.  His symptoms subsided, for the most part, but he has had a constant lingering 2/10 chest tightness ever since then.  This becomes worse if he tries to do any physical activity.  Pacemaker site is healing well.  [name] also has a history of neurocardiogenic syncope, which thankfully has not worsened since he started treatment with nitrates.  Device interrogation shows that he continues to have a very high burden of atrial fibrillation (60-80%) despite treatment with dofetilide.  He appears to be unaware of the arrhythmia.  Rate control is good.  He is on chronic warfarin anticoagulation and his most recent INR was 2.6 on July 8.  He is also taking clopidogrel.  The patient does not have symptoms concerning for COVID-19 infection (fever, chills, cough, or new shortness of breath).   Hospital Course     Consultants: None   1. CAD s/p CABG with crescendo angina: patient seen outpatient 04/08/2019 with complaints of worsening angina. Prior cath 02/20/2019 with high-grade stenosis in LCx and SVG to diagonal, initially recommended for medical management with consideration for PCI if angina continued. He presented 04/13/2019 for outpatient cath with successful stenting to LCx and SVG to diagonal. He was recommended for triple therapy with aspirin, plavix, and coumadin for one month, then continue plavix and coumadin for a minimum of 1 year. Groin site was stable on the day of discharge.  - Continue aspirin x1 month and plavix for a minimum of 1 year - Continue statin - Continue atenolol and imdur  2. Paroxysmal atrial fibrillation: high burden of afib on last device check. Rate well controlled  this admission on home atenolol and diltiazem. Home coumadin resumed following LHC. CHADSVasc 2 (HTN, CAD).   - Continue atenolol and diltiazem for rate control - Continue coumadin for stroke ppx  3. Tachy-brady syndrome s/p PPM: recent generator change out.  - Continue routine outpatient monitoring.   4. Neurally mediated syncope: no full-blown syncope since pacemaker implantation, but still has occasional presyncope likely related to vasodepressive events.   - If he has good angina relief from percutaneous revascularization I would then try to back off vasodilator medications.   5. HTN: BP mildly elevated. Lisinopril held for cath. - Resume lisinopril - Continue atenolol, diltiazem, imdur  6. HLD: LDL well controlled at 26 on 04/09/2019. Triglycerides continue to be elevated.  - Continue crestor and omega 3  7. DM type 2: metformin held this admission with plans to resume 04/16/2019, following catheterization - Continue metformin  _____________  Discharge Vitals Blood pressure 90/60, pulse 80, temperature 97.7 F (36.5 C), temperature source Oral, resp. rate 19, height 5\' 10"  (1.778 m), weight 101.1 kg, SpO2 96 %.  Filed Weights   04/13/19 0602 04/14/19 0525  Weight: 99.8 kg 101.1 kg     Labs & Radiologic Studies    CBC Recent Labs    04/13/19 1247  WBC 7.9  HGB 13.2  HCT 40.5  MCV 86.9  PLT 601   Basic Metabolic Panel Recent Labs    04/13/19 1247  CREATININE 0.84   Liver Function Tests No results for input(s): AST, ALT, ALKPHOS, BILITOT, PROT, ALBUMIN in the last 72 hours. No results for input(s): LIPASE, AMYLASE in the last 72 hours. Cardiac Enzymes No results  for input(s): CKTOTAL, CKMB, CKMBINDEX, TROPONINI in the last 72 hours. BNP Invalid input(s): POCBNP D-Dimer No results for input(s): DDIMER in the last 72 hours. Hemoglobin A1C No results for input(s): HGBA1C in the last 72 hours. Fasting Lipid Panel No results for input(s): CHOL, HDL, LDLCALC,  TRIG, CHOLHDL, LDLDIRECT in the last 72 hours. Thyroid Function Tests No results for input(s): TSH, T4TOTAL, T3FREE, THYROIDAB in the last 72 hours.  Invalid input(s): FREET3 _____________  Dg Chest 2 View  Result Date: 03/25/2019 CLINICAL DATA:  Chest pain EXAM: CHEST - 2 VIEW COMPARISON:  October 12, 2015 FINDINGS: Lungs are clear. Heart size and pulmonary vascularity are normal. No adenopathy. Patient is status post coronary artery bypass grafting. Pacemaker leads are attached to the right atrium and right ventricle. No bone lesions. IMPRESSION: No edema or consolidation.  Stable cardiac silhouette. Electronically Signed   By: Lowella Grip III M.D.   On: 03/25/2019 19:07   Disposition   Patient was seen and examined by Dr. Sallyanne Porter who deemed patient as stable for discharge. Follow-up has been arranged. Discharge medications as listed below.   Follow-up Plans & Appointments    Follow-up Information    [facility] Follow up on 04/17/2019.   Specialty: Cardiology Why: Please arrive 15 minutes early for your 9:15am coumadin clinic appointment Contact information: [place] [phone]       Almyra Deforest, [place] Follow up on 04/20/2019.   Specialties: Cardiology, Radiology Why: Please arrive 15 minutes early for your 9:15am appointment - post-hospital follow-up.  Contact information: [address] [place] [address] [phone]          Discharge Instructions    AMB Referral to Cardiac Rehabilitation - Phase II   Complete by: As directed    Diagnosis: Coronary Stents   After initial evaluation and assessments completed: Virtual Based Care may be provided alone or in conjunction with Phase 2 Cardiac Rehab based on patient barriers.: Yes      Discharge Medications   Allergies as of 04/14/2019   No Known Allergies     Medication List    TAKE these medications   acetaminophen 500 MG tablet  Commonly known as: TYLENOL Take 1,000 mg by mouth every 6 (six) hours as needed for moderate pain.   aspirin 81 MG chewable tablet Chew 1 tablet (81 mg total) by mouth daily. Stop taking aspirin 05/14/2019   atenolol 100 MG tablet Commonly known as: TENORMIN TAKE 1 TABLET BY MOUTH ONCE DAILY   clonazePAM 0.5 MG tablet Commonly known as: KLONOPIN Take 1 tablet (0.5 mg total) by mouth 2 (two) times daily as needed for anxiety.   clopidogrel 75 MG tablet Commonly known as: PLAVIX Take 1 tablet by mouth once daily   diltiazem 180 MG 24 hr capsule Commonly known as: Dilt-XR Take 1 capsule (180 mg total) by mouth daily. NEED OV.   fish oil-omega-3 fatty acids 1000 MG capsule Take 2 g by mouth daily.   isosorbide mononitrate 30 MG 24 hr tablet Commonly known as: IMDUR Take 3 tablets (90 mg total) by mouth daily.   lisinopril 20 MG tablet Commonly known as: ZESTRIL Take 1 tablet (20 mg total) by mouth daily.   metFORMIN 500 MG tablet Commonly known as: GLUCOPHAGE Take 1 tablet (500 mg total) by mouth 2 (two) times daily with a meal. Notes to patient: Restart this medication 04/16/2019.    nitroGLYCERIN 0.4 MG SL tablet Commonly known  as: NITROSTAT Place 1 tablet (0.4 mg total) under the tongue every 5 (five) minutes as needed for up to 10 days for chest pain.   potassium chloride 10 MEQ tablet Commonly known as: K-DUR TAKE 1  BY MOUTH ONCE DAILY What changed: See the new instructions.   rosuvastatin 40 MG tablet Commonly known as: CRESTOR TAKE 1 TABLET BY MOUTH ONCE DAILY   SLEEP AID PO Take 1 tablet by mouth at bedtime as needed (sleep).   venlafaxine XR 75 MG 24 hr capsule Commonly known as: EFFEXOR-XR TAKE ONE CAPSULE BY MOUTH ONCE DAILY TAKE  WITH  150MG   TO  EQUAL  A  TOTAL  OF  225MG   DAILY What changed: See the new instructions.   venlafaxine XR 150 MG 24 hr capsule Commonly known as: EFFEXOR-XR TAKE ONE CAPSULE BY MOUTH ONCE DAILY WITH  THE  75  MG  TO   TOTAL  225  MG  DAILY What changed:   how much to take  how to take this  when to take this   warfarin 5 MG tablet Commonly known as: COUMADIN Take as directed. If you are unsure how to take this medication, talk to your nurse or doctor. Original instructions: TAKE 1/2 TO 1 (ONE-HALF TO ONE) TABLET BY MOUTH ONCE DAILY AS DIRECTED BY COUMADIN CLINIC What changed: See the new instructions.        Aspirin prescribed at discharge?  Yes High Intensity Statin Prescribed? (Lipitor 40-80mg  or Crestor 20-40mg ): Yes Beta Blocker Prescribed? Yes For EF <40%, was ACEI/ARB Prescribed? Yes ADP Receptor Inhibitor Prescribed? (i.e. Plavix etc.-Includes Medically Managed Patients): Yes For EF <40%, Aldosterone Inhibitor Prescribed? No: EF >40% Was EF assessed during THIS hospitalization? No: assessed on previous admission Was Cardiac Rehab II ordered? (Included Medically managed Patients): Yes   Outstanding Labs/Studies   None  Duration of Discharge Encounter   Greater than 30 minutes including physician time.  Signed, Abigail Butts PA-C 04/14/2019, 8:43 AM   I have seen and examined the patient along with Roby Lofts, PA.  I have reviewed the chart, notes and new data.  I agree with PA's note.  Key new complaints: Feels well today.  No angina.  No dyspnea.  Had prodromal symptoms of vasovagal syncope during phlebotomy yesterday, resolved. Key examination changes: No evidence of bleeding/hematoma in groin site.  Clear lungs.  Walking in hallway without difficulty. Key new findings / data: Telemetry shows mostly atrial fibrillation overnight, but back in atrial paced rhythm this morning.  Unaware of rhythm transition subjectively.  ECG shows atrial fibrillation with occasional ventricular paced beats.  PLAN: Discharge home today.  TOC follow-up has been scheduled.  If he remains chest pain-free at follow-up visit we will discontinue isosorbide mononitrate since this increases the  chance of him experiencing vasovagal syncope.  On dual antiplatelet therapy and warfarin anticoagulation.  Aspirin will be stopped after 30 days and he will continue on long-term clopidogrel and warfarin.  Lucas Klein, MD, [place] [phone] 04/14/2019, 8:58 AM

## 2019-04-14 NOTE — Progress Notes (Signed)
CARDIAC REHAB PHASE I   PRE:  Rate/Rhythm: 69 Afib with paced beats  BP:  Sitting: 114/89      SaO2: 96 RA  MODE:  Ambulation: 470 ft   POST:  Rate/Rhythm: 93 Afib with paced beats  BP:  Sitting: 149/104 --> 159/96    SaO2: 97 RA   Pt ambulated 460ft in hallway independently with steady gait. Pt denies CP, SOB, or dizziness. Pt educated on importance of ASA, Plavix, Coumadin, and NTG. Pt given stent card, and heart healthy diet. Reviewed restrictions, site care, and exercise guidelines. Encouraged pt to find ways to manage his stress and anxieties. Will refer to CRP II [place], not interested in Virtual Cardiac Rehab at this time.  [phone] Rufina Falco, RN BSN 04/14/2019 9:10 AM

## 2019-04-14 NOTE — Discharge Instructions (Addendum)
PLEASE REMEMBER TO BRING ALL OF YOUR MEDICATIONS TO EACH OF YOUR FOLLOW-UP OFFICE VISITS.  PLEASE ATTEND ALL SCHEDULED FOLLOW-UP APPOINTMENTS.   Activity: Increase activity slowly as tolerated. You may shower, but no soaking baths (or swimming) for 1 week. No driving for 24 hours. No lifting over 5 lbs for 1 week. No sexual activity for 1 week.   You May Return to Work: in 1 week (if applicable)  Wound Care: You may wash cath site gently with soap and water. Keep cath site clean and dry. If you notice pain, swelling, bleeding or pus at your cath site, please call [phone]  ______________________________________________________________________________  You will take aspirin for 30 days, then stop on 05/14/2019. You will continue taking plavix for at least 1 year, in addition to your coumadin for stroke prevention.   DO NOT TAKE NON-STEROIDAL ANTI-INFLAMMATORY MEDICATIONS.  OVER THE COUNTER IBUPROFEN (ADVIL OR MOTRIN) OR NAPROXENE (ALEVE) CAN INCREASE RISK OF STOMACH BLEEDING WHEN TAKEN WITH OTHER BLOOD THINNERS LIKE PLAVIX AND COUMADIN.    TAKING METFORMIN (GLUCOPHAGE) SOONER THAT 48 HOURS AFTER DYE ADMINISTERED (GIVEN DURING CARDIAC CATH) MAY CAUSE INJURY TO KIDNEYS  Restart your metformin on 04/16/2019.

## 2019-04-15 ENCOUNTER — Telehealth (HOSPITAL_COMMUNITY): Payer: Self-pay

## 2019-04-15 ENCOUNTER — Telehealth: Payer: Self-pay | Admitting: Cardiovascular Disease

## 2019-04-15 NOTE — Telephone Encounter (Signed)
[facility] calling stating that they need a ICD-10 diagnoses code fo medication dofetilide 500 mg tablet. Please call [phone] option 2. Please address

## 2019-04-15 NOTE — Telephone Encounter (Signed)
Pt insurance is active and benefits verified through [employer] Co-pay 0, DED 0/0 met, out of pocket $6,850/$6,850 met, co-insurance 0. no pre-authorization required, REF# [id number]  Will contact patient to see if he is interested in the Cardiac Rehab Program. If interested, patient will need to complete follow up appt. Once completed, patient will be contacted for scheduling upon review by the RN Navigator.

## 2019-04-16 ENCOUNTER — Other Ambulatory Visit: Payer: Self-pay

## 2019-04-16 DIAGNOSIS — I10 Essential (primary) hypertension: Secondary | ICD-10-CM

## 2019-04-16 MED ORDER — LISINOPRIL 20 MG PO TABS: 20.0000 mg | ORAL_TABLET | Freq: Every day | ORAL | 3 refills | Status: DC

## 2019-04-17 ENCOUNTER — Ambulatory Visit (INDEPENDENT_AMBULATORY_CARE_PROVIDER_SITE_OTHER): Payer: BC Managed Care – PPO | Admitting: *Deleted

## 2019-04-17 ENCOUNTER — Other Ambulatory Visit: Payer: Self-pay

## 2019-04-17 DIAGNOSIS — Z7901 Long term (current) use of anticoagulants: Secondary | ICD-10-CM

## 2019-04-17 DIAGNOSIS — I480 Paroxysmal atrial fibrillation: Secondary | ICD-10-CM

## 2019-04-17 LAB — POCT INR: INR: 1.8 — AB (ref 2.0–3.0)

## 2019-04-17 NOTE — Patient Instructions (Signed)
Description   Tonight and tomorrow take 1 pill, then Continue taking 1/2 pill everyday except 1 pill on Mondays, Wednesdays and Fridays.  Repeat INR in 2 weeks. Call us with any medication changes # [phone].

## 2019-04-17 NOTE — Telephone Encounter (Signed)
Medication has been discontinued. See telemedicine encounter from 04/08/19.

## 2019-04-20 ENCOUNTER — Ambulatory Visit: Payer: BC Managed Care – PPO | Admitting: Physician Assistant

## 2019-04-20 NOTE — Progress Notes (Deleted)
Cardiology Office Note    Date:  04/20/2019   ID:  Lucas Porter, DOB 1955-10-02, MRN [id number]  PCP:  Patient, No Pcp Per  Cardiologist:  Dr. Sallyanne Kuster   No chief complaint on file.   History of Present Illness:  Lucas Porter is a 63 y.o. 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  Patient underwent DES to left main and left circumflex in 2013.  More recently, patient presented with recurrent angina.  He underwent cardiac catheterization in June 2020 and found to have high-grade stenosis in distal left circumflex artery and SVG to diagonal.  Both vessels were relatively small and his symptom improved after initiation of long-acting nitrate.  Therefore medical therapy was recommended.  Isosorbide mononitrate was increased to 60 mg daily.  He underwent pacemaker change out on the same day.  Recent device check showed high burden of atrial fibrillation.  Due to recurrent angina, he presented back to the Cath Lab on 04/13/2019.  Cardiac catheterization revealed high-grade stenosis in left circumflex and SVG to diagonal, he was treated with drug-eluting stents in both area.  Postprocedure, he was discharged on aspirin, Plavix and Coumadin.  It is recommended to stop aspirin after 30 days and continue Plavix and Coumadin for minimum of 1 year.  If he remains chest pain-free on follow-up, the plan is to discontinue isosorbide mononitrate due to increased chance of him experiencing vasovagal syncope on vasodilator.   Past Medical History:  Diagnosis Date  . Anxiety   . Atrial fibrillation (HCC)    paroxysmal  . Atrial flutter ([place])   . Coagulopathy ([place])    persistent elevation of ACT during 10/17/11 hospitalization  . Coronary artery disease    s/p CABG 2010, s/p Successful PCI  10/17/11 of Distal Left Main and the Left Circumflex with a Promus Element DES stent -- 4.0 mm x 16 mm  postdilated to 4.12 mm   . DDD (degenerative disc disease)   . Depression   . Dysrhythmia    afib  . Facial asymmetry 2010   pt. stated it was very temporary, never seen by doctor for it.   Marland Kitchen GERD (gastroesophageal reflux disease)   . Heart murmur   . Hyperlipidemia   . Hypertension   . Kidney stones   . Presence of permanent cardiac pacemaker    Medtronic   . S/P coronary artery stent placement to distal Lt. main and Lt. Cx 10/17/11  . Tachycardia-bradycardia syndrome ([place]) 2/12   s/p PPM (MDT) by Dr Blanch Media    Past Surgical History:  Procedure Laterality Date  . ATRIAL FIBRILLATION ABLATION  04/24/12, 12/02/12   PVI x2 and CTI ablation by Dr Rayann Heman  . ATRIAL FIBRILLATION ABLATION N/A 04/24/2012   Procedure: ATRIAL FIBRILLATION ABLATION;  Surgeon: Thompson Grayer, MD;  Location: [facility] CATH LAB;  Service: Cardiovascular;  Laterality: N/A;  . ATRIAL FIBRILLATION ABLATION N/A 12/02/2012   Procedure: ATRIAL FIBRILLATION ABLATION;  Surgeon: Thompson Grayer, MD;  Location: [facility] CATH LAB;  Service: Cardiovascular;  Laterality: N/A;  . CORONARY ANGIOPLASTY  10/19/11  . CORONARY ARTERY BYPASS GRAFT  2010  . CORONARY STENT INTERVENTION N/A 04/13/2019   Procedure: CORONARY STENT INTERVENTION;  Surgeon: Nelva Bush, MD;  Location: [facility] CV LAB;  Service: Cardiovascular;  Laterality: N/A;  . CYSTOSCOPY  2007 ?   treatment for Kidney stones   . INGUINAL HERNIA REPAIR Left 01/04/2016  Procedure: LAPAROSCOPIC LEFT INGUINAL HERNIA WITH MESH;  Surgeon: Ralene Ok, MD;  Location: [facility];  Service: General;  Laterality: Left;  . INSERT / REPLACE / REMOVE PACEMAKER  2/12   Medtronic  . INSERTION OF MESH Left 01/04/2016   Procedure: INSERTION OF MESH;  Surgeon: Ralene Ok, MD;  Location: [facility];  Service: General;  Laterality: Left;  . LAPAROSCOPIC LYSIS OF ADHESIONS Left 01/04/2016   Procedure: LAPAROSCOPIC LYSIS OF ADHESIONS;  Surgeon: Ralene Ok, MD;  Location: [facility];  Service: General;   Laterality: Left;  . LEFT HEART CATH AND CORS/GRAFTS ANGIOGRAPHY N/A 02/20/2019   Procedure: LEFT HEART CATH AND CORS/GRAFTS ANGIOGRAPHY;  Surgeon: Belva Crome, MD;  Location: [facility] CV LAB;  Service: Cardiovascular;  Laterality: N/A;  . LEFT HEART CATH AND CORS/GRAFTS ANGIOGRAPHY N/A 04/13/2019   Procedure: LEFT HEART CATH AND CORS/GRAFTS ANGIOGRAPHY;  Surgeon: Nelva Bush, MD;  Location: [place] [facility] CV LAB;  Service: Cardiovascular;  Laterality: N/A;  . LEFT HEART CATHETERIZATION WITH CORONARY/GRAFT ANGIOGRAM N/A 10/17/2011   Procedure: LEFT HEART CATHETERIZATION WITH Beatrix Fetters;  Surgeon: [name], MD;  Location: [facility] CATH LAB;  Service: Cardiovascular;  Laterality: N/A;  . LEFT HEART CATHETERIZATION WITH CORONARY/GRAFT ANGIOGRAM N/A 02/08/2012   Procedure: LEFT HEART CATHETERIZATION WITH Beatrix Fetters;  Surgeon: [name], MD;  Location: [facility] CATH LAB;  Service: Cardiovascular;  Laterality: N/A;  . NM MYOCAR PERF WALL MOTION  03/23/2011   Low risk  . PERCUTANEOUS CORONARY STENT INTERVENTION (PCI-S)  10/17/2011   Procedure: PERCUTANEOUS CORONARY STENT INTERVENTION (PCI-S);  Surgeon: [name], MD;  Location[facility] CATH LAB;  Service: Cardiovascular;;  . PPM GENERATOR CHANGEOUT N/A 02/20/2019   Procedure: PPM GENERATOR CHANGEOUT;  Surgeon: [name], MD;  Location: [facility] CV LAB;  Service: Cardiovascular;  Laterality: N/A;  . TEE WITHOUT CARDIOVERSION  04/23/2012   Procedure: TRANSESOPHAGEAL ECHOCARDIOGRAM (TEE);  Surgeon: Josue Hector, MD;  Location: [facility] ENDOSCOPY;  Service: Cardiovascular;  Laterality: N/A;  spoke to pt about time change from 11a to 12p/DL  . TEE WITHOUT CARDIOVERSION N/A 12/01/2012   Procedure: TRANSESOPHAGEAL ECHOCARDIOGRAM (TEE);  Surgeon: [name], MD;  Location: [facility] ENDOSCOPY;  Service: Cardiovascular;  Laterality: N/A;    Current Medications: Outpatient Medications Prior to Visit  Medication Sig Dispense Refill  .  acetaminophen (TYLENOL) 500 MG tablet Take 1,000 mg by mouth every 6 (six) hours as needed for moderate pain.    Marland Kitchen aspirin 81 MG chewable tablet Chew 1 tablet (81 mg total) by mouth daily. Stop taking aspirin 05/14/2019 30 tablet 0  . atenolol (TENORMIN) 100 MG tablet TAKE 1 TABLET BY MOUTH ONCE DAILY (Patient taking differently: Take 100 mg by mouth daily. ) 90 tablet 3  . clonazePAM (KLONOPIN) 0.5 MG tablet Take 1 tablet (0.5 mg total) by mouth 2 (two) times daily as needed for anxiety. 30 tablet 1  . clopidogrel (PLAVIX) 75 MG tablet Take 1 tablet by mouth once daily (Patient taking differently: Take 75 mg by mouth daily. ) 30 tablet 0  . diltiazem (DILT-XR) 180 MG 24 hr capsule Take 1 capsule (180 mg total) by mouth daily. NEED OV. 30 capsule 3  . dofetilide (TIKOSYN) 500 MCG capsule Take 1 capsule by mouth twice daily 120 capsule 0  . Doxylamine Succinate, Sleep, (SLEEP AID PO) Take 1 tablet by mouth at bedtime as needed (sleep).    . fish oil-omega-3 fatty acids 1000 MG capsule Take 2 g by mouth daily.     Marland Kitchen  isosorbide mononitrate (IMDUR) 30 MG 24 hr tablet Take 3 tablets (90 mg total) by mouth daily. 30 tablet 11  . lisinopril (ZESTRIL) 20 MG tablet Take 1 tablet (20 mg total) by mouth daily. 90 tablet 3  . metFORMIN (GLUCOPHAGE) 500 MG tablet Take 1 tablet (500 mg total) by mouth 2 (two) times daily with a meal. 60 tablet 0  . nitroGLYCERIN (NITROSTAT) 0.4 MG SL tablet Place 1 tablet (0.4 mg total) under the tongue every 5 (five) minutes as needed for up to 10 days for chest pain. 10 tablet 0  . potassium chloride (K-DUR) 10 MEQ tablet TAKE 1  BY MOUTH ONCE DAILY 90 tablet 1  . rosuvastatin (CRESTOR) 40 MG tablet TAKE 1 TABLET BY MOUTH ONCE DAILY (Patient taking differently: Take 40 mg by mouth daily. ) 90 tablet 3  . venlafaxine XR (EFFEXOR-XR) 150 MG 24 hr capsule TAKE ONE CAPSULE BY MOUTH ONCE DAILY WITH  THE  75  MG  TO  TOTAL  225  MG  DAILY (Patient taking differently: Take 225 mg by  mouth See admin instructions. TAKE ONE CAPSULE BY MOUTH ONCE DAILY WITH  THE  75  MG  TO  TOTAL  225  MG  DAILY) 90 capsule 3  . venlafaxine XR (EFFEXOR-XR) 75 MG 24 hr capsule TAKE ONE CAPSULE BY MOUTH ONCE DAILY TAKE  WITH  150MG   TO  EQUAL  A  TOTAL  OF  225MG   DAILY (Patient taking differently: Take 225 mg by mouth See admin instructions. 75 mg and 225 mg daily) 90 capsule 3  . warfarin (COUMADIN) 5 MG tablet TAKE 1/2 TO 1 (ONE-HALF TO ONE) TABLET BY MOUTH ONCE DAILY AS DIRECTED BY COUMADIN CLINIC (Patient taking differently: Take 2.5-5 mg by mouth See admin instructions. Take 2.5 mg at night on Wed, Fri, Sat, and Mon.  Take 5 mg at night on Thur, Sun, and Tue.) 90 tablet 1   No facility-administered medications prior to visit.      Allergies:   Patient has no known allergies.   Social History   Socioeconomic History  . Marital status: Married    Spouse name: Morey Hummingbird  . Number of children: 2  . Years of education: 10  . Highest education level: 10th grade  Occupational History  . Occupation: disability-heart disease    Comment: Investment banker, corporate  Social Needs  . Financial resource strain: Not on file  . Food insecurity    Worry: Not on file    Inability: Not on file  . Transportation needs    Medical: Not on file    Non-medical: Not on file  Tobacco Use  . Smoking status: Former Smoker    Years: 1.00    Types: Cigarettes    Quit date: 12/18/1963    Years since quitting: 55.3  . Smokeless tobacco: Former Systems developer    Quit date: 10/03/2012  . Tobacco comment: quit smoking in 1970's  Substance and Sexual Activity  . Alcohol use: Yes    Alcohol/week: 1.0 standard drinks    Types: 1 Cans of beer per week    Comment: rare  . Drug use: Yes    Types: Marijuana    Comment: sometimes (down from every day)  . Sexual activity: Yes  Lifestyle  . Physical activity    Days per week: Not on file    Minutes per session: Not on file  . Stress: Not on file  Relationships  . Social  connections  Talks on phone: Not on file    Gets together: Not on file    Attends religious service: Not on file    Active member of club or organization: Not on file    Attends meetings of clubs or organizations: Not on file    Relationship status: Not on file  Other Topics Concern  . Not on file  Social History Narrative   Pt lives in [place] with spouse.     2 grown daughters.     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   Caffeine intake daily      Family History:  The patient's ***family history includes Alzheimer's disease in his father; Colon cancer in his mother; Coronary artery disease in an other family member; Diabetes in his brother; Heart attack in his brother; Heart disease in his brother and father; Hypertension in his brother, brother, and sister; Stroke in his brother.   ROS:   Please see the history of present illness.    ROS All other systems reviewed and are negative.   PHYSICAL EXAM:   VS:  There were no vitals taken for this visit.   GEN: Well nourished, well developed, in no acute distress  HEENT: normal  Neck: no JVD, carotid bruits, or masses Cardiac: ***RRR; no murmurs, rubs, or gallops,no edema  Respiratory:  clear to auscultation bilaterally, normal work of breathing GI: soft, nontender, nondistended, + BS MS: no deformity or atrophy  Skin: warm and dry, no rash Neuro:  Alert and Oriented x 3, Strength and sensation are intact Psych: euthymic mood, full affect  Wt Readings from Last 3 Encounters:  04/14/19 222 lb 14.2 oz (101.1 kg)  02/20/19 215 lb (97.5 kg)  02/16/19 215 lb (97.5 kg)      Studies/Labs Reviewed:   EKG:  EKG is*** ordered today.  The ekg ordered today demonstrates ***  Recent Labs: 03/25/2019: ALT 22 04/14/2019: BUN 13; Creatinine, Ser 0.82; Hemoglobin 14.0; Platelets 193; Potassium 4.2; Sodium 135   Lipid Panel    Component Value Date/Time   CHOL 117 04/09/2019 1532   TRIG 253 (H) 04/09/2019  1532   HDL 40 04/09/2019 1532   CHOLHDL 2.9 04/09/2019 1532   CHOLHDL 5.9 (H) 09/24/2016 1010   VLDL 48 (H) 09/24/2016 1010   LDLCALC 26 04/09/2019 1532    Additional studies/ records that were reviewed today include:  ***    ASSESSMENT:    No diagnosis found.   PLAN:  In order of problems listed above:  1. ***    Medication Adjustments/Labs and Tests Ordered: Current medicines are reviewed at length with the patient today.  Concerns regarding medicines are outlined above.  Medication changes, Labs and Tests ordered today are listed in the Patient Instructions below. There are no Patient Instructions on file for this visit.   Hilbert Corrigan, [place]  04/20/2019 8:41 AM    [address], [place], [place]  [address] Phone: [phone]; Fax: [phone]

## 2019-04-20 NOTE — Telephone Encounter (Signed)
APPT scheduled for today

## 2019-04-20 NOTE — Telephone Encounter (Signed)
Patient was a "no show" for his TOC visit with [place] PA today. Called patient to potentially r/s and VM is full.   If/when he calls back, please reschedule for APP

## 2019-05-01 ENCOUNTER — Ambulatory Visit (INDEPENDENT_AMBULATORY_CARE_PROVIDER_SITE_OTHER): Payer: BC Managed Care – PPO | Admitting: *Deleted

## 2019-05-01 ENCOUNTER — Other Ambulatory Visit: Payer: Self-pay

## 2019-05-01 DIAGNOSIS — Z7901 Long term (current) use of anticoagulants: Secondary | ICD-10-CM | POA: Diagnosis not present

## 2019-05-01 DIAGNOSIS — I480 Paroxysmal atrial fibrillation: Secondary | ICD-10-CM | POA: Diagnosis not present

## 2019-05-01 LAB — POCT INR: INR: 3.4 — AB (ref 2.0–3.0)

## 2019-05-01 NOTE — Patient Instructions (Signed)
Description   Skip tonight's dose, then  Continue taking 1/2 pill everyday except 1 pill on Mondays, Wednesdays and Fridays.  Repeat INR in 2 weeks. Call us with any medication changes # [phone].

## 2019-05-05 ENCOUNTER — Other Ambulatory Visit: Payer: Self-pay | Admitting: Cardiovascular Disease

## 2019-05-15 ENCOUNTER — Other Ambulatory Visit: Payer: Self-pay

## 2019-05-15 ENCOUNTER — Ambulatory Visit (INDEPENDENT_AMBULATORY_CARE_PROVIDER_SITE_OTHER): Payer: BC Managed Care – PPO | Admitting: *Deleted

## 2019-05-15 DIAGNOSIS — Z7901 Long term (current) use of anticoagulants: Secondary | ICD-10-CM

## 2019-05-15 DIAGNOSIS — I480 Paroxysmal atrial fibrillation: Secondary | ICD-10-CM | POA: Diagnosis not present

## 2019-05-15 LAB — POCT INR: INR: 3.4 — AB (ref 2.0–3.0)

## 2019-05-15 NOTE — Patient Instructions (Signed)
Description   Skip tonight's dose, then change your dose to 1/2 pill everyday except 1 pill on Mondays and Fridays.  Repeat INR in 2 weeks. Call us with any medication changes # [phone].

## 2019-05-20 ENCOUNTER — Other Ambulatory Visit: Payer: Self-pay | Admitting: Cardiovascular Disease

## 2019-05-20 IMAGING — MR MR HEAD WO/W CM
14 of 22 series · 30 of 48 positions shown · IV contrast (multihance)
Comparison: None.

CLINICAL DATA: Hypogonadism, weight gain. History of hypertension,
atrial fibrillation, hyperlipidemia.

EXAM:
MRI HEAD WITHOUT AND WITH CONTRAST
TECHNIQUE: Multiplanar, multiecho pulse sequences of the brain and surrounding
structures were obtained without and with intravenous contrast.
Please note, per technologist note, dynamic phases are delayed due
to patient's nausea.
CONTRAST:  15mL MULTIHANCE GADOBENATE DIMEGLUMINE 529 MG/ML IV SOLN

[Series 3: DWI · axial · 3.0mm · 1.09mm/px · z∈[-55,+83]mm · 6 of 94 slices shown (1 of 4)]
[im 1/94]
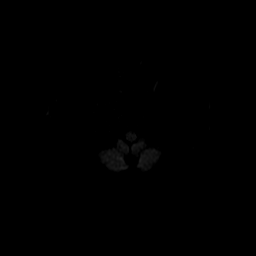
[im 19/94]
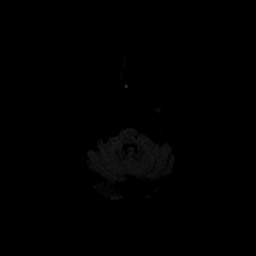
[im 38/94]
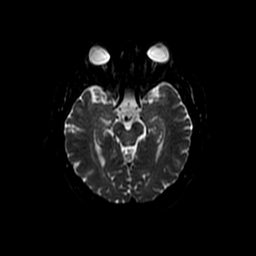
[im 56/94]
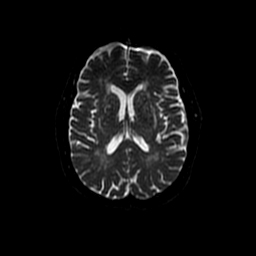
[im 75/94]
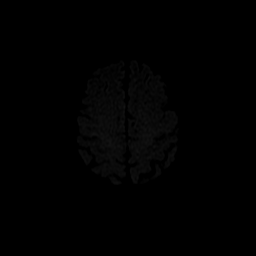
[im 94/94]
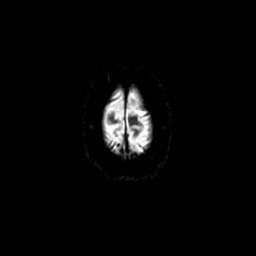

[Series 4: DWI · coronal · 5.0mm · 1.09mm/px · 4 of 66 slices shown (2 of 4)]
[im 1/66]
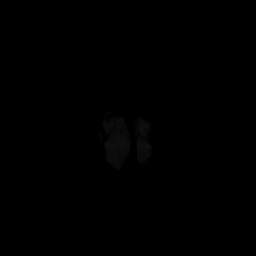
[im 22/66]
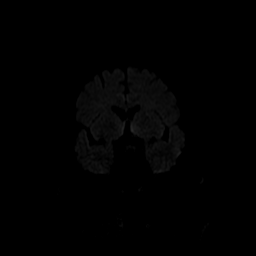
[im 44/66]
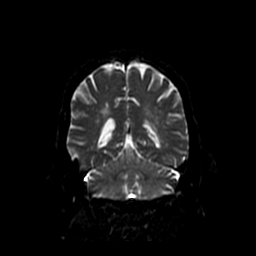
[im 66/66]
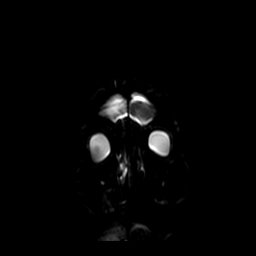

[Series 5: T2 · axial · 5.0mm · 0.43mm/px · 1 of 24 slices shown (1 of 2)]
[im 1/24]
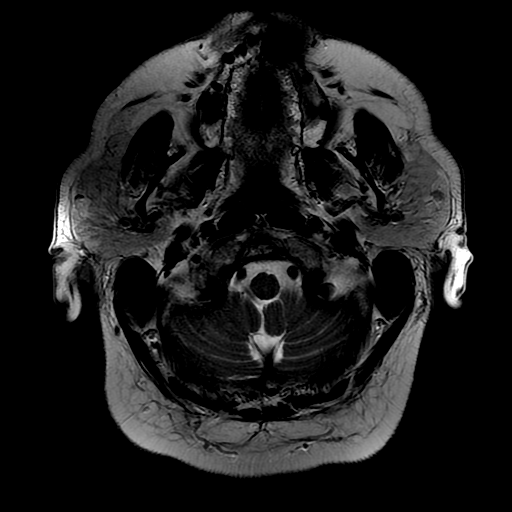

[Series 6: FLAIR · axial · 5.0mm · 0.43mm/px · 1 of 24 slices shown]
[im 1/24]
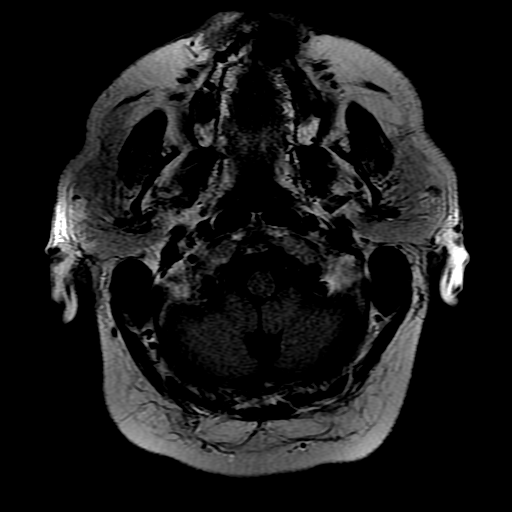

[Series 7: T1 · sagittal · 5.0mm · 0.47mm/px · 1 of 23 slices shown (1 of 3)]
[im 1/23]
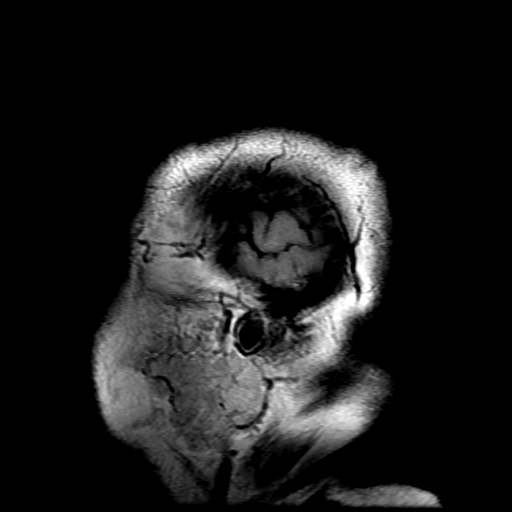

[Series 9: ax mpgr · axial · 5.0mm · 0.43mm/px · z∈[-61,+83]mm · 2 of 25 slices shown]
[im 1/25]
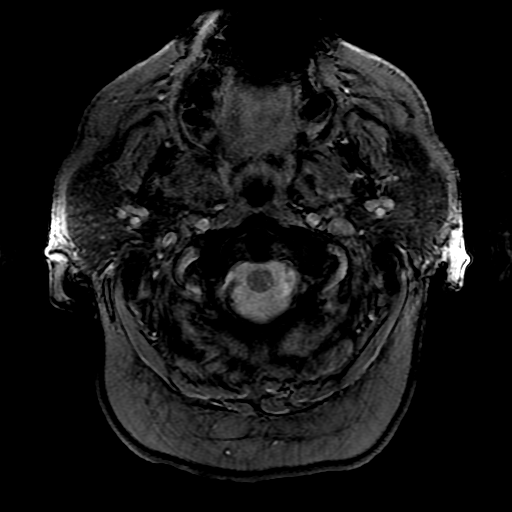
[im 25/25]
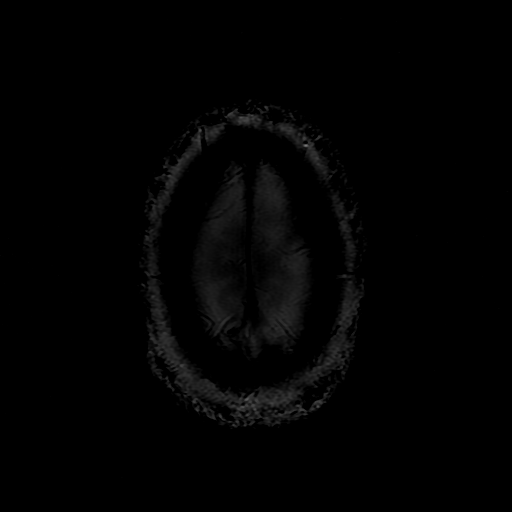

[Series 10: T2 · coronal · 5.0mm · 0.39mm/px · 2 of 25 slices shown (2 of 2)]
[im 1/25]
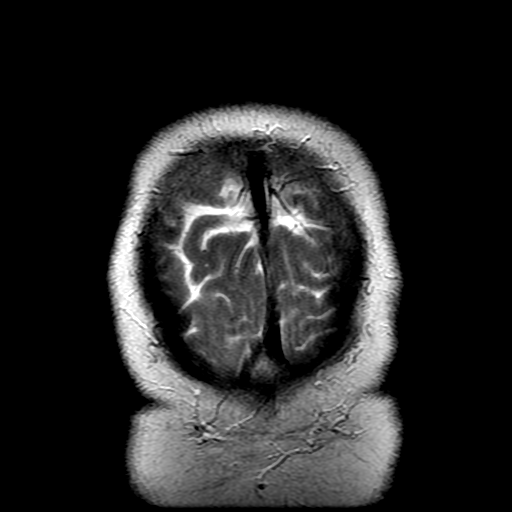
[im 25/25]
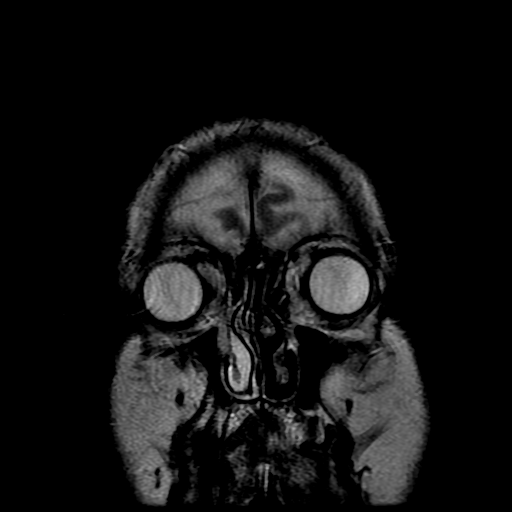

[Series 11: T1 · sagittal · 3.0mm · 0.35mm/px · 1 of 13 slices shown (2 of 3)]
[im 1/13]
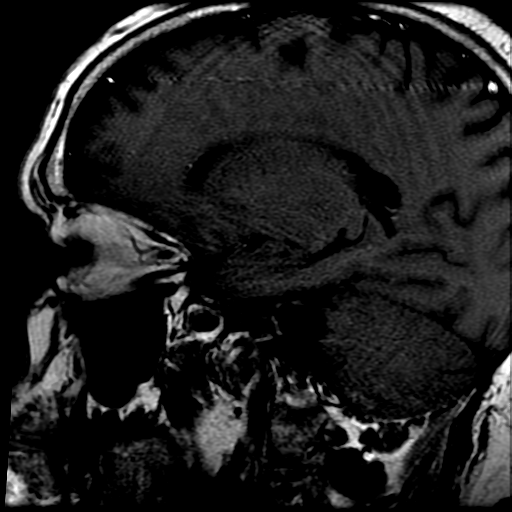

[Series 12: T1 · coronal · 3.0mm · 0.35mm/px · 1 of 13 slices shown (3 of 3)]
[im 1/13]
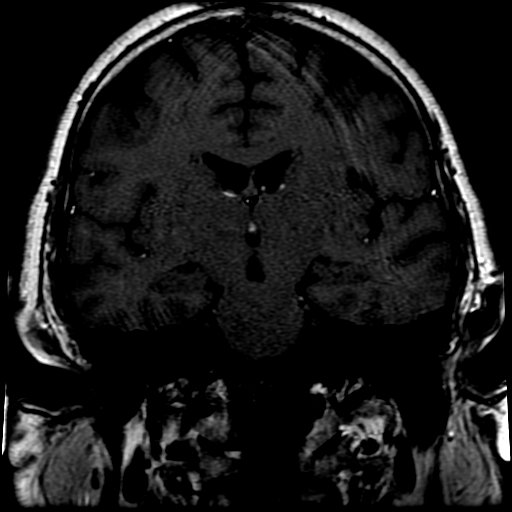

[Series 14: T1 post-contrast · sagittal · 3.0mm · 0.35mm/px · 1 of 13 slices shown (1 of 3)]
[im 1/13]
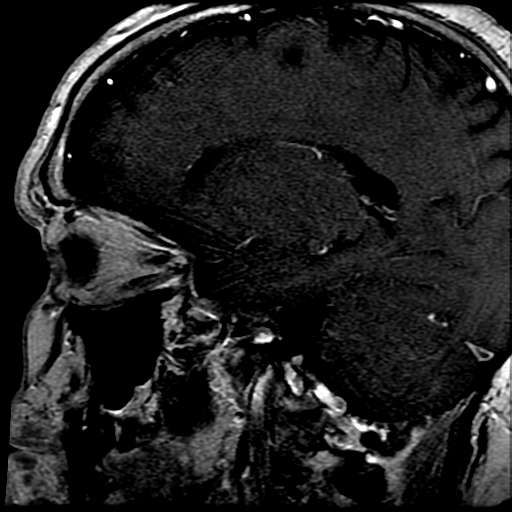

[Series 15: T1 post-contrast · coronal · 3.0mm · 0.35mm/px · 1 of 13 slices shown (2 of 3)]
[im 1/13]
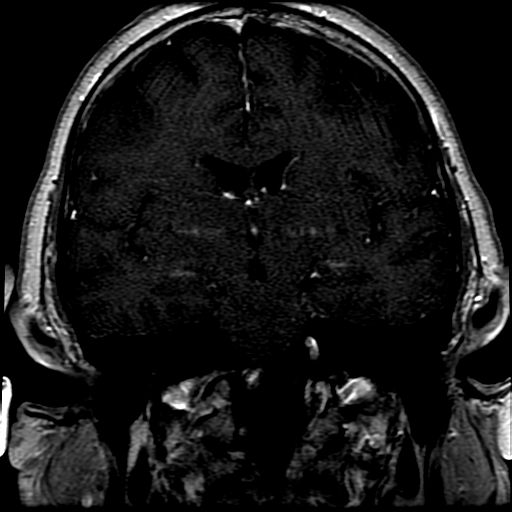

[Series 17: T1 post-contrast · coronal · 5.0mm · 0.39mm/px · 2 of 25 slices shown (3 of 3)]
[im 1/25]
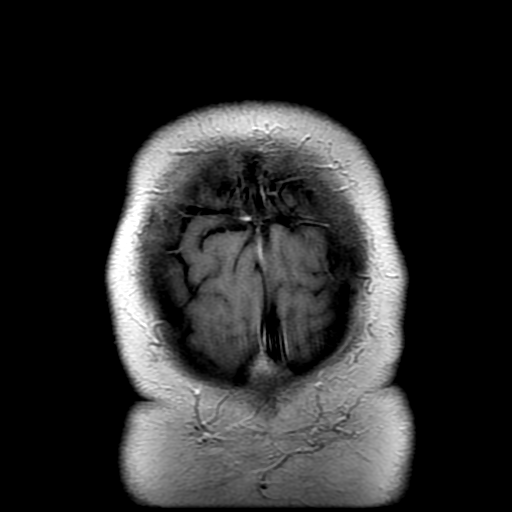
[im 25/25]
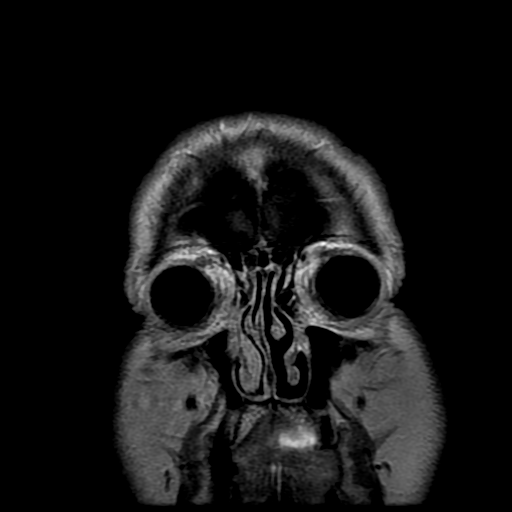

[Series 300: DWI · axial · 3.0mm · 1.09mm/px · z∈[-55,+83]mm · 4 of 47 slices shown (3 of 4)]
[im 1/47]
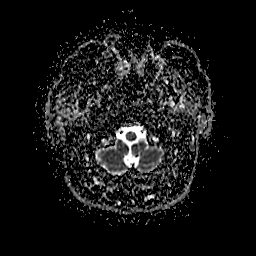
[im 16/47]
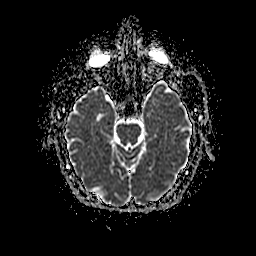
[im 31/47]
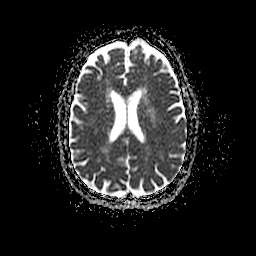
[im 47/47]
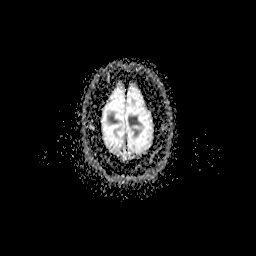

[Series 400: DWI · coronal · 5.0mm · 1.09mm/px · 3 of 33 slices shown (4 of 4)]
[im 1/33]
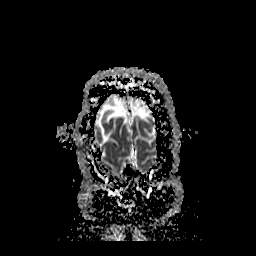
[im 17/33]
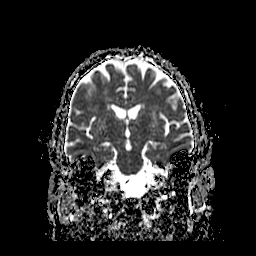
[im 33/33]
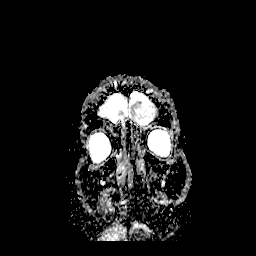

[30 of 48 positions shown; findings below may reference images not displayed]

FINDINGS: SELLA: No abnormal sellar expansion. Normal posterior pituitary
bright spot. No early foci of suspicious hypo enhancement. Pituitary
stalk is midline with normal enhancement. Symmetric appearance of
the cavernous sinuses and cavernous sinus flow voids. Normal
appearance of the overlying optic chiasm.

INTRACRANIAL CONTENTS: No reduced diffusion to suggest acute
ischemia or hypercellular tumor. LEFT periventricular/ corona
radiata chronic micro hemorrhages. The ventricles and sulci are
normal for patient's age. Confluent supratentorial and patchy
pontine white matter FLAIR T2 hyperintensities. Prominent basal
ganglia perivascular spaces associated with chronic small vessel
ischemic disease. No suspicious parenchymal signal, mass lesions,
mass effect. No abnormal intraparenchymal or extra-axial
enhancement. No abnormal extra-axial fluid collections.

VASCULAR: Normal major intracranial vascular flow voids present at
skull base.

ORBITS: The ocular globes and orbital contents are non-suspicious.

SINUSES: RIGHT frontal sinusitis without paranasal sinus air-fluid
levels. Mastoid air cells are well aerated.

SKULL/SOFT TISSUES: No suspicious calvarial bone marrow signal.
Craniocervical junction maintained.
IMPRESSION: 1. Negative MRI of the sella with and without contrast.
2. Moderate to severe chronic small vessel ischemic disease.

## 2019-05-20 MED ORDER — ISOSORBIDE MONONITRATE ER 30 MG PO TB24: 90.0000 mg | ORAL_TABLET | Freq: Every day | ORAL | 11 refills | Status: DC

## 2019-05-20 NOTE — Telephone Encounter (Signed)
Pt's wife calling stating that pt medication isosorbide was increased to 3 tablets daily, but this medication was not sent to pt's pharmacy [facility] in [place], [place].  Please address

## 2019-05-26 ENCOUNTER — Ambulatory Visit (INDEPENDENT_AMBULATORY_CARE_PROVIDER_SITE_OTHER): Payer: BC Managed Care – PPO | Admitting: *Deleted

## 2019-05-26 DIAGNOSIS — Z950 Presence of cardiac pacemaker: Secondary | ICD-10-CM

## 2019-05-26 LAB — CUP PACEART REMOTE DEVICE CHECK
Battery Remaining Longevity: 159 mo
Battery Voltage: 3.15 V
Brady Statistic AP VP Percent: 0 %
Brady Statistic AP VS Percent: 0 %
Brady Statistic AS VP Percent: 0 %
Brady Statistic AS VS Percent: 0 %
Brady Statistic RA Percent Paced: 0.37 %
Brady Statistic RV Percent Paced: 61.89 %
Date Time Interrogation Session: 20200908054144
Implantable Lead Implant Date: 20120229
Implantable Lead Implant Date: 20120229
Implantable Lead Location: 753859
Implantable Lead Location: 753860
Implantable Pulse Generator Implant Date: 20200605
Lead Channel Impedance Value: 342 Ohm
Lead Channel Impedance Value: 475 Ohm
Lead Channel Impedance Value: 513 Ohm
Lead Channel Impedance Value: 551 Ohm
Lead Channel Pacing Threshold Amplitude: 0.625 V
Lead Channel Pacing Threshold Amplitude: 0.75 V
Lead Channel Pacing Threshold Pulse Width: 0.4 ms
Lead Channel Pacing Threshold Pulse Width: 0.4 ms
Lead Channel Sensing Intrinsic Amplitude: 0.75 mV
Lead Channel Sensing Intrinsic Amplitude: 0.75 mV
Lead Channel Sensing Intrinsic Amplitude: 18.875 mV
Lead Channel Sensing Intrinsic Amplitude: 18.875 mV
Lead Channel Setting Pacing Amplitude: 1.75 V
Lead Channel Setting Pacing Amplitude: 2.5 V
Lead Channel Setting Pacing Pulse Width: 0.4 ms
Lead Channel Setting Sensing Sensitivity: 2 mV

## 2019-06-01 ENCOUNTER — Other Ambulatory Visit: Payer: Self-pay

## 2019-06-01 ENCOUNTER — Ambulatory Visit (INDEPENDENT_AMBULATORY_CARE_PROVIDER_SITE_OTHER): Payer: BC Managed Care – PPO | Admitting: Pharmacist

## 2019-06-01 DIAGNOSIS — Z7901 Long term (current) use of anticoagulants: Secondary | ICD-10-CM

## 2019-06-01 DIAGNOSIS — I480 Paroxysmal atrial fibrillation: Secondary | ICD-10-CM

## 2019-06-01 LAB — POCT INR: INR: 2.3 (ref 2.0–3.0)

## 2019-06-10 ENCOUNTER — Ambulatory Visit (INDEPENDENT_AMBULATORY_CARE_PROVIDER_SITE_OTHER): Payer: BC Managed Care – PPO | Admitting: Cardiovascular Disease

## 2019-06-10 ENCOUNTER — Ambulatory Visit (INDEPENDENT_AMBULATORY_CARE_PROVIDER_SITE_OTHER): Payer: BC Managed Care – PPO | Admitting: Pharmacist Clinician (PhC)/ Clinical Pharmacy Specialist

## 2019-06-10 ENCOUNTER — Encounter: Payer: Self-pay | Admitting: Cardiology

## 2019-06-10 ENCOUNTER — Other Ambulatory Visit: Payer: Self-pay

## 2019-06-10 VITALS — BP 131/74 | HR 88 | Temp 98.1°F | Ht 70.0 in | Wt 222.8 lb

## 2019-06-10 DIAGNOSIS — I480 Paroxysmal atrial fibrillation: Secondary | ICD-10-CM | POA: Diagnosis not present

## 2019-06-10 DIAGNOSIS — I25728 Atherosclerosis of autologous artery coronary artery bypass graft(s) with other forms of angina pectoris: Secondary | ICD-10-CM

## 2019-06-10 DIAGNOSIS — Z7901 Long term (current) use of anticoagulants: Secondary | ICD-10-CM

## 2019-06-10 DIAGNOSIS — I495 Sick sinus syndrome: Secondary | ICD-10-CM | POA: Diagnosis not present

## 2019-06-10 DIAGNOSIS — E782 Mixed hyperlipidemia: Secondary | ICD-10-CM

## 2019-06-10 DIAGNOSIS — E1169 Type 2 diabetes mellitus with other specified complication: Secondary | ICD-10-CM

## 2019-06-10 DIAGNOSIS — F3340 Major depressive disorder, recurrent, in remission, unspecified: Secondary | ICD-10-CM

## 2019-06-10 DIAGNOSIS — E119 Type 2 diabetes mellitus without complications: Secondary | ICD-10-CM

## 2019-06-10 DIAGNOSIS — I10 Essential (primary) hypertension: Secondary | ICD-10-CM

## 2019-06-10 DIAGNOSIS — E669 Obesity, unspecified: Secondary | ICD-10-CM

## 2019-06-10 DIAGNOSIS — R55 Syncope and collapse: Secondary | ICD-10-CM

## 2019-06-10 DIAGNOSIS — I4819 Other persistent atrial fibrillation: Secondary | ICD-10-CM | POA: Diagnosis not present

## 2019-06-10 DIAGNOSIS — Z950 Presence of cardiac pacemaker: Secondary | ICD-10-CM

## 2019-06-10 LAB — POCT INR: INR: 2.2 (ref 2.0–3.0)

## 2019-06-10 NOTE — Progress Notes (Signed)
Remote pacemaker transmission.   

## 2019-06-10 NOTE — Patient Instructions (Signed)

## 2019-06-10 NOTE — Progress Notes (Signed)
Cardiology Office Note    Date:  06/11/2019   ID:  SHAKEIL MUSTIAN, DOB July 10, 1956, MRN [id number]  PCP:  Patient, No Pcp Per  Cardiologist:  Thompson Grayer, MD; [name], MD   Chief Complaint  Patient presents with  . Coronary Artery Disease  . Atrial Fibrillation  . Pacemaker Check    History of Present Illness:  Lucas Porter is a 63 y.o. male with coronary artery disease (CABG 2010, LMCA-LCX DES 2013), persistent atrial fibrillation (history of redo ablation, failed dofetilide), neurally mediated syncope, tachycardia-bradycardia syndrome, dual-chamber pacemaker  (Medtronic Revo 2012), hypertension, hyperlipidemia, depression returning for follow-up.  Roughly 2 months ago Lucas Porter underwent placement of 2 drug-eluting stents (Resolute Onyx 3.0 x 12 mm in the SVG to diagonal and Resolute onyx 2.5 x 18 mm in the distal left circumflex coronary artery) for worsening stable angina despite increased doses of 3 antianginal medications.  He underwent a pacemaker generator change at about the same time.   He has not had overt congestive heart failure but left ventriculography showed an EF of 40-50% due to contractile dyssynchrony during ventricular pacing and LVEDP elevated at 22 mmHg.  He has been in persistent atrial fibrillation 80% of the time of the last several months and had early recurrence of arrhythmia following cardioversion so we stopped his dofetilide.  Interrogation of his pacemaker today shows uninterrupted atrial fibrillation for the last couple of months.  I reprogrammed his device to VVI today.  His new pacemaker device function appears to be normal with estimated longevity is 13.2 years.  He has 54% ventricular pacing.  He has been in persistent atrial fibrillation since late July.  Activity level remains constant at about 4 hours.  He has not had problems with high ventricular rates.  Heart rate histogram distribution appears satisfactory.  He has occasional episodes  of presyncope with his typical prodrome that he can abort by laying down or correction.  He is not aware of palpitations and has not had full-blown syncope.  He has mild exertional dyspnea, NYHA functional class II and does not have edema, orthopnea or PND.  He has not had any angina pectoris since his percutaneous intervention.  Lucas Porter has a long-standing history of CAD. He underwent bypass surgery in 2011 (LIMA to LAD, free radial artery to left PDA , saphenous vein grafts to diagonal and ramus intermedius).  In 2013 he received a drug-eluting Promus 416 mm stent to the left main coronary artery extending into the proximal left circumflex artery.    In July 2020 he had another PCI (Resolute Onyx 3.0 x 12 mm in the SVG to diagonal and Resolute onyx 2.5 x 18 mm in the distal left circumflex coronary artery.  He has undergone 2 separate radiofrequency ablation procedures for atrial fibrillation via the percutaneous approach in 2013 and 2014. 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   Dofetilide worked to maintain sinus rhythm for a long period of time but was abandoned in 2020 since he had intractable persistent atrial fibrillation.  He has had serious problems with depression and reports that many years ago he had suicidal thoughts, none recently.  He feels guilty about not being a good son, guilty for surviving 2 of his best friends who both committed suicide, trouble with relationships with family members.  He has excellent support from his wife of over 37 years.  Past Medical History:  Diagnosis Date  . Anxiety   . Atrial fibrillation (  HCC)    paroxysmal  . Atrial flutter ([place])   . Coagulopathy ([place])    persistent elevation of ACT during 10/17/11 hospitalization  . Coronary artery disease    s/p CABG 2010, s/p Successful PCI  10/17/11 of Distal Left Main and the Left Circumflex with a Promus Element DES stent -- 4.0 mm x 16 mm postdilated to 4.12  mm   . DDD (degenerative disc disease)   . Depression   . Dysrhythmia    afib  . Facial asymmetry 2010   pt. stated it was very temporary, never seen by doctor for it.   Marland Kitchen GERD (gastroesophageal reflux disease)   . Heart murmur   . Hyperlipidemia   . Hypertension   . Kidney stones   . Presence of permanent cardiac pacemaker    Medtronic   . S/P coronary artery stent placement to distal Lt. main and Lt. Cx 10/17/11  . Tachycardia-bradycardia syndrome ([place]) 2/12   s/p PPM (MDT) by Dr Blanch Media    Past Surgical History:  Procedure Laterality Date  . ATRIAL FIBRILLATION ABLATION  04/24/12, 12/02/12   PVI x2 and CTI ablation by Dr Rayann Heman  . ATRIAL FIBRILLATION ABLATION N/A 04/24/2012   Procedure: ATRIAL FIBRILLATION ABLATION;  Surgeon: Thompson Grayer, MD;  Location: [facility] CATH LAB;  Service: Cardiovascular;  Laterality: N/A;  . ATRIAL FIBRILLATION ABLATION N/A 12/02/2012   Procedure: ATRIAL FIBRILLATION ABLATION;  Surgeon: Thompson Grayer, MD;  Location: [facility] CATH LAB;  Service: Cardiovascular;  Laterality: N/A;  . CORONARY ANGIOPLASTY  10/19/11  . CORONARY ARTERY BYPASS GRAFT  2010  . CORONARY STENT INTERVENTION N/A 04/13/2019   Procedure: CORONARY STENT INTERVENTION;  Surgeon: Nelva Bush, MD;  Location: [place] CV LAB;  Service: Cardiovascular;  Laterality: N/A;  . CYSTOSCOPY  2007 ?   treatment for Kidney stones   . INGUINAL HERNIA REPAIR Left 01/04/2016   Procedure: LAPAROSCOPIC LEFT INGUINAL HERNIA WITH MESH;  Surgeon: Ralene Ok, MD;  Location: [place];  Service: General;  Laterality: Left;  . INSERT / REPLACE / REMOVE PACEMAKER  2/12   Medtronic  . INSERTION OF MESH Left 01/04/2016   Procedure: INSERTION OF MESH;  Surgeon: Ralene Ok, MD;  Location: [place];  Service: General;  Laterality: Left;  . LAPAROSCOPIC LYSIS OF ADHESIONS Left 01/04/2016   Procedure: LAPAROSCOPIC LYSIS OF ADHESIONS;  Surgeon: Ralene Ok, MD;  Location: [place];  Service: General;  Laterality: Left;  .  LEFT HEART CATH AND CORS/GRAFTS ANGIOGRAPHY N/A 02/20/2019   Procedure: LEFT HEART CATH AND CORS/GRAFTS ANGIOGRAPHY;  Surgeon: Belva Crome, MD;  Location: [place] CV LAB;  Service: Cardiovascular;  Laterality: N/A;  . LEFT HEART CATH AND CORS/GRAFTS ANGIOGRAPHY N/A 04/13/2019   Procedure: LEFT HEART CATH AND CORS/GRAFTS ANGIOGRAPHY;  Surgeon: Nelva Bush, MD;  Location: [place] CV LAB;  Service: Cardiovascular;  Laterality: N/A;  . LEFT HEART CATHETERIZATION WITH CORONARY/GRAFT ANGIOGRAM N/A 10/17/2011   Procedure: LEFT HEART CATHETERIZATION WITH Beatrix Fetters;  Surgeon: [name], MD;  Location: [facility] CATH LAB;  Service: Cardiovascular;  Laterality: N/A;  . LEFT HEART CATHETERIZATION WITH CORONARY/GRAFT ANGIOGRAM N/A 02/08/2012   Procedure: LEFT HEART CATHETERIZATION WITH Beatrix Fetters;  Surgeon: [name], MD;  Location: [facility] CATH LAB;  Service: Cardiovascular;  Laterality: N/A;  . NM MYOCAR PERF WALL MOTION  03/23/2011   Low risk  . PERCUTANEOUS CORONARY STENT INTERVENTION (PCI-S)  10/17/2011   Procedure: PERCUTANEOUS CORONARY STENT INTERVENTION (PCI-S);  Surgeon: [name], MD;  Location: [facility] CATH LAB;  Service: Cardiovascular;;  . PPM GENERATOR CHANGEOUT N/A 02/20/2019   Procedure: PPM GENERATOR CHANGEOUT;  Surgeon: [name], MD;  Location: [place] CV LAB;  Service: Cardiovascular;  Laterality: N/A;  . TEE WITHOUT CARDIOVERSION  04/23/2012   Procedure: TRANSESOPHAGEAL ECHOCARDIOGRAM (TEE);  Surgeon: Josue Hector, MD;  Location: [facility] ENDOSCOPY;  Service: Cardiovascular;  Laterality: N/A;  spoke to pt about time change from 11a to 12p/DL  . TEE WITHOUT CARDIOVERSION N/A 12/01/2012   Procedure: TRANSESOPHAGEAL ECHOCARDIOGRAM (TEE);  Surgeon: [name], MD;  Location: [facility] ENDOSCOPY;  Service: Cardiovascular;  Laterality: N/A;    Current Medications: Outpatient Medications Prior to Visit  Medication Sig Dispense Refill  . acetaminophen (TYLENOL)  500 MG tablet Take 1,000 mg by mouth every 6 (six) hours as needed for moderate pain.    Marland Kitchen atenolol (TENORMIN) 100 MG tablet TAKE 1 TABLET BY MOUTH ONCE DAILY (Patient taking differently: Take 100 mg by mouth daily. ) 90 tablet 3  . clonazePAM (KLONOPIN) 0.5 MG tablet Take 1 tablet (0.5 mg total) by mouth 2 (two) times daily as needed for anxiety. 30 tablet 1  . clopidogrel (PLAVIX) 75 MG tablet Take 1 tablet (75 mg total) by mouth daily. 30 tablet 3  . DILT-XR 180 MG 24 hr capsule Take 1 capsule by mouth once daily 90 capsule 3  . dofetilide (TIKOSYN) 500 MCG capsule Take 1 capsule by mouth twice daily 120 capsule 0  . Doxylamine Succinate, Sleep, (SLEEP AID PO) Take 1 tablet by mouth at bedtime as needed (sleep).    . fish oil-omega-3 fatty acids 1000 MG capsule Take 2 g by mouth daily.     . isosorbide mononitrate (IMDUR) 30 MG 24 hr tablet Take 3 tablets (90 mg total) by mouth daily. 30 tablet 11  . lisinopril (ZESTRIL) 20 MG tablet Take 1 tablet (20 mg total) by mouth daily. 90 tablet 3  . metFORMIN (GLUCOPHAGE) 500 MG tablet Take 1 tablet (500 mg total) by mouth 2 (two) times daily with a meal. 60 tablet 0  . potassium chloride (K-DUR) 10 MEQ tablet TAKE 1  BY MOUTH ONCE DAILY 90 tablet 1  . rosuvastatin (CRESTOR) 40 MG tablet TAKE 1 TABLET BY MOUTH ONCE DAILY (Patient taking differently: Take 40 mg by mouth daily. ) 90 tablet 3  . venlafaxine XR (EFFEXOR-XR) 150 MG 24 hr capsule TAKE ONE CAPSULE BY MOUTH ONCE DAILY WITH  THE  75  MG  TO  TOTAL  225  MG  DAILY (Patient taking differently: Take 225 mg by mouth See admin instructions. TAKE ONE CAPSULE BY MOUTH ONCE DAILY WITH  THE  75  MG  TO  TOTAL  225  MG  DAILY) 90 capsule 3  . venlafaxine XR (EFFEXOR-XR) 75 MG 24 hr capsule TAKE ONE CAPSULE BY MOUTH ONCE DAILY TAKE  WITH  150MG   TO  EQUAL  A  TOTAL  OF  225MG   DAILY (Patient taking differently: Take 225 mg by mouth See admin instructions. 75 mg and 225 mg daily) 90 capsule 3  . warfarin  (COUMADIN) 5 MG tablet TAKE 1/2 TO 1 (ONE-HALF TO ONE) TABLET BY MOUTH ONCE DAILY AS DIRECTED BY COUMADIN CLINIC (Patient taking differently: Take 2.5-5 mg by mouth See admin instructions. Take 2.5 mg at night on Wed, Fri, Sat, and Mon.  Take 5 mg at night on Thur, Sun, and Tue.) 90 tablet 1  . nitroGLYCERIN (NITROSTAT) 0.4 MG SL tablet Place 1 tablet (0.4 mg total) under the  tongue every 5 (five) minutes as needed for up to 10 days for chest pain. 10 tablet 0   No facility-administered medications prior to visit.      Allergies:   Patient has no known allergies.   Social History   Socioeconomic History  . Marital status: Married    Spouse name: Morey Hummingbird  . Number of children: 2  . Years of education: 10  . Highest education level: 10th grade  Occupational History  . Occupation: disability-heart disease    Comment: Investment banker, corporate  Social Needs  . Financial resource strain: Not on file  . Food insecurity    Worry: Not on file    Inability: Not on file  . Transportation needs    Medical: Not on file    Non-medical: Not on file  Tobacco Use  . Smoking status: Former Smoker    Years: 1.00    Types: Cigarettes    Quit date: 12/18/1963    Years since quitting: 55.5  . Smokeless tobacco: Former Systems developer    Quit date: 10/03/2012  . Tobacco comment: quit smoking in 1970's  Substance and Sexual Activity  . Alcohol use: Yes    Alcohol/week: 1.0 standard drinks    Types: 1 Cans of beer per week    Comment: rare  . Drug use: Yes    Types: Marijuana    Comment: sometimes (down from every day)  . Sexual activity: Yes  Lifestyle  . Physical activity    Days per week: Not on file    Minutes per session: Not on file  . Stress: Not on file  Relationships  . Social Herbalist on phone: Not on file    Gets together: Not on file    Attends religious service: Not on file    Active member of club or organization: Not on file    Attends meetings of clubs or organizations: Not on  file    Relationship status: Not on file  Other Topics Concern  . Not on file  Social History Narrative   Pt lives in [place] with spouse.     2 grown daughters.     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   Caffeine intake daily      Family History:  The patient's family history includes Alzheimer's disease in his father; Colon cancer in his mother; Coronary artery disease in an other family member; Diabetes in his brother; Heart attack in his brother; Heart disease in his brother and father; Hypertension in his brother, brother, and sister; Stroke in his brother.   ROS:   Please see the history of present illness.    ROS all other systems are reviewed and are negative  PHYSICAL EXAM:   VS:  BP 131/74   Pulse 88   Temp 98.1 F (36.7 C) (Temporal)   Ht 5\' 10"  (1.778 m)   Wt 222 lb 12.8 oz (101.1 kg)   SpO2 96%   BMI 31.97 kg/m     General: Alert, oriented x3, no distress, mildly obese.  Well-healed left subclavian pacemaker site Head: no evidence of trauma, PERRL, EOMI, no exophtalmos or lid lag, no myxedema, no xanthelasma; normal ears, nose and oropharynx Neck: normal jugular venous pulsations and no hepatojugular reflux; brisk carotid pulses without delay and no carotid bruits Chest: clear to auscultation, no signs of consolidation by percussion or palpation, normal fremitus, symmetrical and full respiratory excursions Cardiovascular: normal position and quality of the apical  impulse, irregular rhythm, normal first and second heart sounds, no murmurs, rubs or gallops Abdomen: no tenderness or distention, no masses by palpation, no abnormal pulsatility or arterial bruits, normal bowel sounds, no hepatosplenomegaly Extremities: no clubbing, cyanosis or edema; 2+ radial, ulnar and brachial pulses bilaterally; 2+ right femoral, posterior tibial and dorsalis pedis pulses; 2+ left femoral, posterior tibial and dorsalis pedis pulses; no subclavian or femoral  bruits Neurological: grossly nonfocal Psych: Normal mood and affect   Wt Readings from Last 3 Encounters:  06/10/19 222 lb 12.8 oz (101.1 kg)  04/14/19 222 lb 14.2 oz (101.1 kg)  02/20/19 215 lb (97.5 kg)      Studies/Labs Reviewed:   EKG:  EKG is not ordered today.   the ECG from April 06, 2019 shows atrial fibrillation with intermittent ventricular pacing, lateral T wave inversion consistent with ischemia. Recent Labs: 03/25/2019: ALT 22 04/14/2019: BUN 13; Creatinine, Ser 0.82; Hemoglobin 14.0; Platelets 193; Potassium 4.2; Sodium 135   Lipid Panel    Component Value Date/Time   CHOL 117 04/09/2019 1532   TRIG 253 (H) 04/09/2019 1532   HDL 40 04/09/2019 1532   CHOLHDL 2.9 04/09/2019 1532   CHOLHDL 5.9 (H) 09/24/2016 1010   VLDL 48 (H) 09/24/2016 1010   LDLCALC 26 04/09/2019 1532     ASSESSMENT:    1. Coronary artery disease of autologous bypass graft with stable angina pectoris ([place])   2. Tachycardia-bradycardia syndrome (HCC)   3. Persistent atrial fibrillation   4. Long term current use of anticoagulant   5. Neurocardiogenic syncope   6. Cardiac pacemaker in situ   7. Mixed hyperlipidemia   8. Essential hypertension   9. Diabetes mellitus type 2 in obese ([place])   10. Recurrent major depressive disorder, in remission ([place])      PLAN:  In order of problems listed above:  1. CAD s/p CABG 2011, LCA DES 2013, distal LCX DES and SVG-Dx DES July 2020: Currently asymptomatic, still on 3 antianginal medications.  Has known occlusion of the bypass surgery to the inferior wall without any good options for revascularization.  If he remains angina free we will try to stop his isosorbide.  He requires diltiazem and atenolol for rate control in addition to their antianginal effect.  Continue clopidogrel through July 2021.  Not on aspirin due to full warfarin anticoagulation. 2. Tachycardia-bradycardia sd: He had 2 previous ablations and took antiarrhythmics with success for  several years but is now in asymptomatic persistent atrial fibrillation. 3. AFib: Dofetilide has been stopped since it was not really working and the risks outweigh the benefits.  Plan rate control management CHADSVasc 2 (HTN, CAD).   4. Anticoagulation: INR levels have been consistently in therapeutic range over the last couple of months.  He has not had bleeding problems. 5. Neurocardiogenic syncope: He is able to pay attention to the prodrome and avoid full-blown syncope.  The episodes are infrequent.  Avoid diuretics. 6. Pacemaker: Reprogrammed to VVI today.  Recent generator change out, excellent lead parameters.  Remote downloads every 3 months. 7. HLP: Excellent LDL but has persistent hypertriglyceridemia and very low HDL which will not likely improve until he loses weight.  8. HTN: Blood pressure control is good. 9. DM: Only on low-dose metformin with acceptable hemoglobin A1c of 6.9%.  Diabetes could likely be "cured" with substantial weight loss. 10. Depression: Currently appears to be well compensated, but reports that when his antidepressant is stopped he has severe symptoms.    Medication Adjustments/Labs  and Tests Ordered: Current medicines are reviewed at length with the patient today.  Concerns regarding medicines are outlined above.  Medication changes, Labs and Tests ordered today are listed in the Patient Instructions below. Patient Instructions  Medication Instructions:  Your physician recommends that you continue on your current medications as directed. Please refer to the Current Medication list given to you today.  If you need a refill on your cardiac medications before your next appointment, please call your pharmacy.   Lab work: None ordered If you have labs (blood work) drawn today and your tests are completely normal, you will receive your results only by: [place] (if you have MyChart) OR A paper copy in the mail If you have any lab test that is abnormal or  we need to change your treatment, we will call you to review the results.  Testing/Procedures: None ordered  Follow-Up: At [facility], you and your health needs are our priority.  As part of our continuing mission to provide you with exceptional heart care, we have created designated Provider Care Teams.  These Care Teams include your primary Cardiologist (physician) and Advanced Practice Providers (APPs -  Physician Assistants and Nurse Practitioners) who all work together to provide you with the care you need, when you need it. You will need a follow up appointment in 6 months.  Please call our office 2 months in advance to schedule this appointment.  You may see [name], MD or one of the following Advanced Practice Providers on your designated Care Team: Almyra Deforest, PA-C Fabian Sharp, [place]          Signed, [name], MD  06/11/2019 11:52 AM    [place] [address], [place], [place]  [address] Phone: [phone]; Fax: [phone]

## 2019-06-10 NOTE — Addendum Note (Signed)
Addended by: Tiajuana Amass on: 06/10/2019 03:36 PM   Modules accepted: Level of Service

## 2019-06-11 ENCOUNTER — Encounter: Payer: Self-pay | Admitting: Cardiovascular Disease

## 2019-06-12 ENCOUNTER — Telehealth (HOSPITAL_COMMUNITY): Payer: Self-pay

## 2019-06-12 ENCOUNTER — Encounter (HOSPITAL_COMMUNITY): Payer: Self-pay

## 2019-06-12 NOTE — Telephone Encounter (Signed)
Attempted to call patient in regards to Cardiac Rehab - LM on VM Mailed letter 

## 2019-06-28 ENCOUNTER — Other Ambulatory Visit: Payer: Self-pay | Admitting: Family Medicine

## 2019-06-28 DIAGNOSIS — E119 Type 2 diabetes mellitus without complications: Secondary | ICD-10-CM

## 2019-06-30 ENCOUNTER — Telehealth (HOSPITAL_COMMUNITY): Payer: Self-pay

## 2019-06-30 NOTE — Telephone Encounter (Signed)
Patient need an appt for any further refills. Patient also need to est care with provider

## 2019-06-30 NOTE — Telephone Encounter (Signed)
No response from pt regarding CR.  Closed referral.  

## 2019-06-30 NOTE — Telephone Encounter (Signed)
LVMTCB and schedule toc appt for med refills

## 2019-07-27 ENCOUNTER — Emergency Department (HOSPITAL_COMMUNITY): Payer: BC Managed Care – PPO

## 2019-07-27 ENCOUNTER — Inpatient Hospital Stay (HOSPITAL_COMMUNITY)
Admission: EM | Admit: 2019-07-27 | Discharge: 2019-07-29 | DRG: 247 | Disposition: A | Discharge status: Home / Self Care | Payer: BC Managed Care – PPO | Attending: Cardiovascular Disease | Admitting: Cardiovascular Disease

## 2019-07-27 ENCOUNTER — Encounter (HOSPITAL_COMMUNITY): Payer: Self-pay | Admitting: Emergency Medicine

## 2019-07-27 DIAGNOSIS — Z87891 Personal history of nicotine dependence: Secondary | ICD-10-CM | POA: Diagnosis not present

## 2019-07-27 DIAGNOSIS — I25110 Atherosclerotic heart disease of native coronary artery with unstable angina pectoris: Principal | ICD-10-CM | POA: Diagnosis present

## 2019-07-27 DIAGNOSIS — Z823 Family history of stroke: Secondary | ICD-10-CM

## 2019-07-27 DIAGNOSIS — I214 Non-ST elevation (NSTEMI) myocardial infarction: Secondary | ICD-10-CM | POA: Diagnosis not present

## 2019-07-27 DIAGNOSIS — Z7901 Long term (current) use of anticoagulants: Secondary | ICD-10-CM | POA: Diagnosis not present

## 2019-07-27 DIAGNOSIS — Z820 Family history of epilepsy and other diseases of the nervous system: Secondary | ICD-10-CM | POA: Diagnosis not present

## 2019-07-27 DIAGNOSIS — I495 Sick sinus syndrome: Secondary | ICD-10-CM | POA: Diagnosis present

## 2019-07-27 DIAGNOSIS — Z79899 Other long term (current) drug therapy: Secondary | ICD-10-CM | POA: Diagnosis not present

## 2019-07-27 DIAGNOSIS — Z800 Family history of malignant neoplasm of digestive organs: Secondary | ICD-10-CM

## 2019-07-27 DIAGNOSIS — I10 Essential (primary) hypertension: Secondary | ICD-10-CM | POA: Diagnosis present

## 2019-07-27 DIAGNOSIS — F419 Anxiety disorder, unspecified: Secondary | ICD-10-CM | POA: Diagnosis present

## 2019-07-27 DIAGNOSIS — Z955 Presence of coronary angioplasty implant and graft: Secondary | ICD-10-CM | POA: Diagnosis not present

## 2019-07-27 DIAGNOSIS — Z7984 Long term (current) use of oral hypoglycemic drugs: Secondary | ICD-10-CM | POA: Diagnosis not present

## 2019-07-27 DIAGNOSIS — E785 Hyperlipidemia, unspecified: Secondary | ICD-10-CM | POA: Diagnosis present

## 2019-07-27 DIAGNOSIS — I25710 Atherosclerosis of autologous vein coronary artery bypass graft(s) with unstable angina pectoris: Secondary | ICD-10-CM | POA: Diagnosis not present

## 2019-07-27 DIAGNOSIS — Z7902 Long term (current) use of antithrombotics/antiplatelets: Secondary | ICD-10-CM | POA: Diagnosis not present

## 2019-07-27 DIAGNOSIS — E119 Type 2 diabetes mellitus without complications: Secondary | ICD-10-CM | POA: Diagnosis present

## 2019-07-27 DIAGNOSIS — Z950 Presence of cardiac pacemaker: Secondary | ICD-10-CM | POA: Diagnosis not present

## 2019-07-27 DIAGNOSIS — H9190 Unspecified hearing loss, unspecified ear: Secondary | ICD-10-CM | POA: Diagnosis present

## 2019-07-27 DIAGNOSIS — I4819 Other persistent atrial fibrillation: Secondary | ICD-10-CM | POA: Diagnosis present

## 2019-07-27 DIAGNOSIS — Z833 Family history of diabetes mellitus: Secondary | ICD-10-CM

## 2019-07-27 DIAGNOSIS — Z951 Presence of aortocoronary bypass graft: Secondary | ICD-10-CM

## 2019-07-27 DIAGNOSIS — Z8249 Family history of ischemic heart disease and other diseases of the circulatory system: Secondary | ICD-10-CM | POA: Diagnosis not present

## 2019-07-27 DIAGNOSIS — K219 Gastro-esophageal reflux disease without esophagitis: Secondary | ICD-10-CM | POA: Diagnosis present

## 2019-07-27 DIAGNOSIS — I4892 Unspecified atrial flutter: Secondary | ICD-10-CM | POA: Diagnosis present

## 2019-07-27 DIAGNOSIS — I340 Nonrheumatic mitral (valve) insufficiency: Secondary | ICD-10-CM | POA: Diagnosis not present

## 2019-07-27 DIAGNOSIS — I200 Unstable angina: Secondary | ICD-10-CM | POA: Diagnosis present

## 2019-07-27 DIAGNOSIS — Z20828 Contact with and (suspected) exposure to other viral communicable diseases: Secondary | ICD-10-CM | POA: Diagnosis present

## 2019-07-27 DIAGNOSIS — I480 Paroxysmal atrial fibrillation: Secondary | ICD-10-CM | POA: Diagnosis present

## 2019-07-27 LAB — SARS CORONAVIRUS 2 (TAT 6-24 HRS): SARS Coronavirus 2: NEGATIVE

## 2019-07-27 LAB — BASIC METABOLIC PANEL
Anion gap: 12 (ref 5–15)
BUN: 11 mg/dL (ref 8–23)
CO2: 25 mmol/L (ref 22–32)
Calcium: 9.2 mg/dL (ref 8.9–10.3)
Chloride: 101 mmol/L (ref 98–111)
Creatinine, Ser: 0.83 mg/dL (ref 0.61–1.24)
GFR calc Af Amer: >60 mL/min (ref >60)
GFR calc non Af Amer: >60 mL/min (ref >60)
Glucose, Bld: 145 mg/dL — ABNORMAL HIGH (ref 70–99)
Potassium: 4.3 mmol/L (ref 3.5–5.1)
Sodium: 138 mmol/L (ref 135–145)

## 2019-07-27 LAB — CBC
HCT: 43.5 % (ref 39.0–52.0)
Hemoglobin: 14.1 g/dL (ref 13.0–17.0)
MCH: 27.8 pg (ref 26.0–34.0)
MCHC: 32.4 g/dL (ref 30.0–36.0)
MCV: 85.8 fL (ref 80.0–100.0)
Platelets: 218 10*3/uL (ref 150–400)
RBC: 5.07 MIL/uL (ref 4.22–5.81)
RDW: 13.8 % (ref 11.5–15.5)
WBC: 8.8 10*3/uL (ref 4.0–10.5)
nRBC: 0 % (ref 0.0–0.2)

## 2019-07-27 LAB — TROPONIN I (HIGH SENSITIVITY)
Troponin I (High Sensitivity): 39 ng/L — ABNORMAL HIGH (ref <18)
Troponin I (High Sensitivity): 42 ng/L — ABNORMAL HIGH (ref <18)

## 2019-07-27 LAB — PLATELET INHIBITION P2Y12: Platelet Function  P2Y12: 225 [PRU] (ref 182–335)

## 2019-07-27 LAB — PROTIME-INR
INR: 2.3 — ABNORMAL HIGH (ref 0.8–1.2)
Prothrombin Time: 24.8 seconds — ABNORMAL HIGH (ref 11.4–15.2)

## 2019-07-27 LAB — CBG MONITORING, ED: Glucose-Capillary: 127 mg/dL — ABNORMAL HIGH (ref 70–99)

## 2019-07-27 MED ORDER — DOXYLAMINE SUCCINATE (SLEEP) 25 MG PO TABS
50.0000 mg | ORAL_TABLET | Freq: Every evening | ORAL | Status: DC | PRN
  Filled 2019-07-27: qty 2

## 2019-07-27 MED ORDER — ONDANSETRON HCL 4 MG/2ML IJ SOLN
4.0000 mg | Freq: Once | INTRAMUSCULAR | Status: AC
  Administered 2019-07-27: 4 mg via INTRAVENOUS
  Filled 2019-07-27: qty 2

## 2019-07-27 MED ORDER — INSULIN ASPART 100 UNIT/ML ~~LOC~~ SOLN
0.0000 [IU] | Freq: Three times a day (TID) | SUBCUTANEOUS | Status: DC
  Administered 2019-07-29: 2 [IU] via SUBCUTANEOUS

## 2019-07-27 MED ORDER — INSULIN ASPART 100 UNIT/ML ~~LOC~~ SOLN: 0.0000 [IU] | Freq: Every day | SUBCUTANEOUS | Status: DC

## 2019-07-27 MED ORDER — DILTIAZEM HCL ER COATED BEADS 180 MG PO CP24
180.0000 mg | ORAL_CAPSULE | Freq: Every day | ORAL | Status: DC
  Administered 2019-07-27 – 2019-07-28 (×2): 180 mg via ORAL
  Filled 2019-07-27 (×5): qty 1

## 2019-07-27 MED ORDER — NITROGLYCERIN IN D5W 200-5 MCG/ML-% IV SOLN
0.0000 ug/min | INTRAVENOUS | Status: DC
  Administered 2019-07-27: 5 ug/min via INTRAVENOUS
  Administered 2019-07-28: 13.5 ug/min via INTRAVENOUS
  Filled 2019-07-27 (×2): qty 250

## 2019-07-27 MED ORDER — CLONAZEPAM 0.5 MG PO TABS
0.5000 mg | ORAL_TABLET | Freq: Two times a day (BID) | ORAL | Status: DC | PRN
  Administered 2019-07-27 – 2019-07-29 (×3): 0.5 mg via ORAL
  Filled 2019-07-27 (×3): qty 1

## 2019-07-27 MED ORDER — SODIUM CHLORIDE 0.9% FLUSH
3.0000 mL | Freq: Two times a day (BID) | INTRAVENOUS | Status: DC
  Administered 2019-07-27 – 2019-07-28 (×2): 3 mL via INTRAVENOUS

## 2019-07-27 MED ORDER — VENLAFAXINE HCL ER 150 MG PO CP24
150.0000 mg | ORAL_CAPSULE | Freq: Every day | ORAL | Status: DC
  Administered 2019-07-28 – 2019-07-29 (×2): 150 mg via ORAL
  Filled 2019-07-27 (×3): qty 1

## 2019-07-27 MED ORDER — MORPHINE SULFATE (PF) 4 MG/ML IV SOLN
4.0000 mg | Freq: Once | INTRAVENOUS | Status: AC
  Administered 2019-07-27: 4 mg via INTRAVENOUS
  Filled 2019-07-27: qty 1

## 2019-07-27 MED ORDER — OMEGA-3-ACID ETHYL ESTERS 1 G PO CAPS
2.0000 g | ORAL_CAPSULE | Freq: Every day | ORAL | Status: DC
  Administered 2019-07-28: 2 g via ORAL
  Filled 2019-07-27 (×2): qty 2

## 2019-07-27 MED ORDER — METOPROLOL TARTRATE 100 MG PO TABS
100.0000 mg | ORAL_TABLET | Freq: Two times a day (BID) | ORAL | Status: DC
  Administered 2019-07-27 – 2019-07-29 (×4): 100 mg via ORAL
  Filled 2019-07-27: qty 1
  Filled 2019-07-27 (×3): qty 4
  Filled 2019-07-27: qty 1
  Filled 2019-07-27: qty 4

## 2019-07-27 MED ORDER — SODIUM CHLORIDE 0.9 % IV SOLN: 250.0000 mL | INTRAVENOUS | Status: DC | PRN

## 2019-07-27 MED ORDER — PHYTONADIONE 5 MG PO TABS
5.0000 mg | ORAL_TABLET | Freq: Once | ORAL | Status: AC
  Administered 2019-07-27: 5 mg via ORAL
  Filled 2019-07-27: qty 1

## 2019-07-27 MED ORDER — POTASSIUM CHLORIDE CRYS ER 10 MEQ PO TBCR
10.0000 meq | EXTENDED_RELEASE_TABLET | Freq: Every day | ORAL | Status: DC
  Administered 2019-07-28 – 2019-07-29 (×2): 10 meq via ORAL
  Filled 2019-07-27 (×3): qty 1

## 2019-07-27 MED ORDER — LISINOPRIL 20 MG PO TABS
20.0000 mg | ORAL_TABLET | Freq: Every day | ORAL | Status: DC
  Administered 2019-07-27 – 2019-07-29 (×3): 20 mg via ORAL
  Filled 2019-07-27 (×3): qty 1

## 2019-07-27 MED ORDER — ROSUVASTATIN CALCIUM 20 MG PO TABS
40.0000 mg | ORAL_TABLET | Freq: Every day | ORAL | Status: DC
  Administered 2019-07-27 – 2019-07-28 (×2): 40 mg via ORAL
  Filled 2019-07-27 (×3): qty 2

## 2019-07-27 MED ORDER — ASPIRIN 81 MG PO CHEW
81.0000 mg | CHEWABLE_TABLET | ORAL | Status: AC
  Administered 2019-07-28: 81 mg via ORAL
  Filled 2019-07-27: qty 1

## 2019-07-27 MED ORDER — SODIUM CHLORIDE 0.9 % WEIGHT BASED INFUSION
3.0000 mL/kg/h | INTRAVENOUS | Status: DC
  Administered 2019-07-28: 3 mL/kg/h via INTRAVENOUS

## 2019-07-27 MED ORDER — CLOPIDOGREL BISULFATE 75 MG PO TABS
75.0000 mg | ORAL_TABLET | Freq: Every day | ORAL | Status: DC
  Administered 2019-07-27 – 2019-07-29 (×3): 75 mg via ORAL
  Filled 2019-07-27 (×3): qty 1

## 2019-07-27 MED ORDER — OMEGA-3 FATTY ACIDS 1000 MG PO CAPS: 2.0000 g | ORAL_CAPSULE | Freq: Every day | ORAL | Status: DC

## 2019-07-27 MED ORDER — SODIUM CHLORIDE 0.9 % WEIGHT BASED INFUSION
1.0000 mL/kg/h | INTRAVENOUS | Status: DC
  Administered 2019-07-28: 1 mL/kg/h via INTRAVENOUS

## 2019-07-27 MED ORDER — SODIUM CHLORIDE 0.9% FLUSH: 3.0000 mL | INTRAVENOUS | Status: DC | PRN

## 2019-07-27 MED ORDER — SODIUM CHLORIDE 0.9% FLUSH
3.0000 mL | Freq: Once | INTRAVENOUS | Status: AC
  Administered 2019-07-27: 3 mL via INTRAVENOUS

## 2019-07-27 MED ORDER — VENLAFAXINE HCL ER 75 MG PO CP24
75.0000 mg | ORAL_CAPSULE | Freq: Every day | ORAL | Status: DC
  Administered 2019-07-28: 75 mg via ORAL
  Filled 2019-07-27 (×5): qty 1

## 2019-07-27 NOTE — ED Provider Notes (Signed)
[facility]ERGENCY DEPARTMENT Provider Note   CSN: [id number] Arrival date & time: 07/27/19  [id number]     History   Chief Complaint No chief complaint on file.   HPI Lucas Porter is a 63 y.o. male.     HPI   Lucas Porter is a 63 y.o. male, with a history of anxiety, paroxysmal A. fib, CAD, GERD, HTN, hyperlipidemia, CABG, pacemaker, presenting to the ED with chest discomfort waking the patient around 5 AM this morning. Described as a pressure, central chest, 9/10, radiating to the left chest towards the pacemaker.  Accompanied by shortness of breath. The last time he felt this type of pain was with his CABG over 10 years ago and with his cardiac cath a few months ago. Symptoms persisted until he took a NTG around 6AM. This "cut the pain in half." Pain then slowly improved after taking ASA (has had a total of 324mg ), reducing to about 1/10. Since arrival in the ED room, pain has begun to increase again and is rated 4/10 at the time of my interview.   Denies fever/chills, cough, diaphoresis, lower extremity edema, orthopnea, abdominal pain, syncope, dizziness, or any other complaints.  Cardiologist: Dr. Sallyanne Kuster  Past Medical History:  Diagnosis Date   Anxiety    Atrial fibrillation ([place])    paroxysmal   Atrial flutter ([place])    Coagulopathy ([place])    persistent elevation of ACT during 10/17/11 hospitalization   Coronary artery disease    s/p CABG 2010, s/p Successful PCI  10/17/11 of Distal Left Main and the Left Circumflex with a Promus Element DES stent -- 4.0 mm x 16 mm postdilated to 4.12 mm    DDD (degenerative disc disease)    Depression    Dysrhythmia    afib   Facial asymmetry 2010   pt. stated it was very temporary, never seen by doctor for it.    GERD (gastroesophageal reflux disease)    Heart murmur    Hyperlipidemia    Hypertension    Kidney stones    Presence of permanent cardiac pacemaker    Medtronic    S/P  coronary artery stent placement to distal Lt. main and Lt. 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 main and Lt. Cx, 10/17/11 10/19/2011   Coronary artery disease involving native coronary artery of native heart with unstable angina pectoris ([place]) 10/19/2011   Coagulopathy ([place]) 10/19/2011   History of pacemaker, with a-v pacing 10/19/2011   CAD (coronary artery disease) with CABG 2011, LIMA to LAD, Free radial to Lt. PDA, VG to Diag and ramus intermedius 10/02/2011   Anxiety 10/02/2011   Paroxysmal atrial fibrillation ([place]) 10/02/2011   Hypertension 10/02/2011   Dyslipidemia 10/02/2011   Hypogonadism male 10/02/2011    Past  Surgical History:  Procedure Laterality Date   ATRIAL FIBRILLATION ABLATION  04/24/12, 12/02/12   PVI x2 and CTI ablation by Dr Rayann Heman   ATRIAL FIBRILLATION ABLATION N/A 04/24/2012   Procedure: ATRIAL FIBRILLATION ABLATION;  Surgeon: Thompson Grayer, MD;  Location: [facility] CATH LAB;  Service: Cardiovascular;  Laterality: N/A;   ATRIAL FIBRILLATION ABLATION N/A 12/02/2012   Procedure: ATRIAL FIBRILLATION ABLATION;  Surgeon: Thompson Grayer, MD;  Location: [facility] CATH LAB;  Service: Cardiovascular;  Laterality: N/A;   CORONARY  ANGIOPLASTY  10/19/11   CORONARY ARTERY BYPASS GRAFT  2010   CORONARY STENT INTERVENTION N/A 04/13/2019   Procedure: CORONARY STENT INTERVENTION;  Surgeon: Nelva Bush, MD;  Location: [facility] CV LAB;  Service: Cardiovascular;  Laterality: N/A;   CYSTOSCOPY  2007 ?   treatment for Kidney stones    INGUINAL HERNIA REPAIR Left 01/04/2016   Procedure: LAPAROSCOPIC LEFT INGUINAL HERNIA WITH MESH;  Surgeon: Ralene Ok, MD;  Location: [facility];  Service: General;  Laterality: Left;   INSERT / REPLACE / REMOVE PACEMAKER  2/12   Medtronic   INSERTION OF MESH Left 01/04/2016   Procedure: INSERTION OF MESH;  Surgeon: Ralene Ok, MD;  Location: [facility];  Service: General;  Laterality: Left;   LAPAROSCOPIC LYSIS OF ADHESIONS Left 01/04/2016   Procedure: LAPAROSCOPIC LYSIS OF ADHESIONS;  Surgeon: Ralene Ok, MD;  Location: [facility];  Service: General;  Laterality: Left;   LEFT HEART CATH AND CORS/GRAFTS ANGIOGRAPHY N/A 02/20/2019   Procedure: LEFT HEART CATH AND CORS/GRAFTS ANGIOGRAPHY;  Surgeon: Belva Crome, MD;  Location: [place] [facility] CV LAB;  Service: Cardiovascular;  Laterality: N/A;   LEFT HEART CATH AND CORS/GRAFTS ANGIOGRAPHY N/A 04/13/2019   Procedure: LEFT HEART CATH AND CORS/GRAFTS ANGIOGRAPHY;  Surgeon: Nelva Bush, MD;  Location: [facility] CV LAB;  Service: Cardiovascular;  Laterality: N/A;   LEFT HEART CATHETERIZATION WITH CORONARY/GRAFT ANGIOGRAM N/A 10/17/2011   Procedure: LEFT HEART CATHETERIZATION WITH Beatrix Fetters;  Surgeon: [name], MD;  Location: [facility] CATH LAB;  Service: Cardiovascular;  Laterality: N/A;   LEFT HEART CATHETERIZATION WITH CORONARY/GRAFT ANGIOGRAM N/A 02/08/2012   Procedure: LEFT HEART CATHETERIZATION WITH Beatrix Fetters;  Surgeon: [name], MD;  Location: [facility] CATH LAB;  Service: Cardiovascular;  Laterality: N/A;   NM MYOCAR PERF WALL MOTION  03/23/2011   Low risk   PERCUTANEOUS CORONARY STENT INTERVENTION (PCI-S)   10/17/2011   Procedure: PERCUTANEOUS CORONARY STENT INTERVENTION (PCI-S);  Surgeon: [name], MD;  Location: [facility] CATH LAB;  Service: Cardiovascular;;   PPM GENERATOR CHANGEOUT N/A 02/20/2019   Procedure: PPM GENERATOR CHANGEOUT;  Surgeon: [name], MD;  Location: [facility] CV LAB;  Service: Cardiovascular;  Laterality: N/A;   TEE WITHOUT CARDIOVERSION  04/23/2012   Procedure: TRANSESOPHAGEAL ECHOCARDIOGRAM (TEE);  Surgeon: Josue Hector, MD;  Location: [facility] ENDOSCOPY;  Service: Cardiovascular;  Laterality: N/A;  spoke to pt about time change from 11a to 12p/DL   TEE WITHOUT CARDIOVERSION N/A 12/01/2012   Procedure: TRANSESOPHAGEAL ECHOCARDIOGRAM (TEE);  Surgeon: [name], MD;  Location: [facility] ENDOSCOPY;  Service: Cardiovascular;  Laterality: N/A;        Home Medications    Prior to Admission medications   Medication Sig Start Date End Date Taking? Authorizing Provider  acetaminophen (TYLENOL) 500 MG tablet Take 1,000 mg by mouth every 6 (six) hours as needed for moderate pain.   Yes [provider]  clonazePAM (KLONOPIN) 0.5 MG tablet Take 1 tablet (0.5 mg total) by mouth 2 (two) times daily as needed  for anxiety. 12/13/14  Yes Copland, Gay Filler, MD  clopidogrel (PLAVIX) 75 MG tablet Take 1 tablet (75 mg total) by mouth daily. 05/05/19  Yes Croitoru, Mihai, MD  DILT-XR 180 MG 24 hr capsule Take 1 capsule by mouth once daily 05/05/19  Yes Croitoru, Mihai, MD  Doxylamine Succinate, Sleep, (SLEEP AID PO) Take 1 tablet by mouth at bedtime as needed (sleep).   Yes [provider]  fish oil-omega-3 fatty acids 1000 MG capsule Take 2 g by mouth daily.    Yes [provider]  isosorbide mononitrate (IMDUR) 30 MG 24 hr tablet Take 3 tablets (90 mg total) by mouth daily. 05/20/19 08/18/19 Yes Croitoru, Mihai, MD  lisinopril (ZESTRIL) 20 MG tablet Take 1 tablet (20 mg total) by mouth daily. 04/16/19  Yes Croitoru, Mihai, MD  metFORMIN (GLUCOPHAGE) 500 MG tablet Take 1  tablet (500 mg total) by mouth 2 (two) times daily with a meal. 06/18/18  Yes Stallings, Zoe A, MD  metoprolol tartrate (LOPRESSOR) 100 MG tablet Take 100 mg by mouth 2 (two) times daily.   Yes [provider]  nitroGLYCERIN (NITROSTAT) 0.4 MG SL tablet Place 1 tablet (0.4 mg total) under the tongue every 5 (five) minutes as needed for up to 10 days for chest pain. 03/25/19 07/27/19 Yes Soto, Johana, PA-C  potassium chloride (K-DUR) 10 MEQ tablet TAKE 1  BY MOUTH ONCE DAILY Patient taking differently: Take 10 mEq by mouth daily.  04/13/19  Yes Croitoru, Mihai, MD  rosuvastatin (CRESTOR) 40 MG tablet TAKE 1 TABLET BY MOUTH ONCE DAILY Patient taking differently: Take 40 mg by mouth daily.  11/01/17  Yes Jeffery, [name], PA  venlafaxine XR (EFFEXOR-XR) 150 MG 24 hr capsule TAKE ONE CAPSULE BY MOUTH ONCE DAILY WITH  THE  75  MG  TO  TOTAL  225  MG  DAILY Patient taking differently: Take 150 mg by mouth daily.  07/12/17  Yes Jeffery, [name], PA  venlafaxine XR (EFFEXOR-XR) 75 MG 24 hr capsule TAKE ONE CAPSULE BY MOUTH ONCE DAILY TAKE  WITH  150MG   TO  EQUAL  A  TOTAL  OF  225MG   DAILY Patient taking differently: Take 75 mg by mouth at bedtime.  07/12/17  Yes Jeffery, [name], PA  warfarin (COUMADIN) 5 MG tablet TAKE 1/2 TO 1 (ONE-HALF TO ONE) TABLET BY MOUTH ONCE DAILY AS DIRECTED BY COUMADIN CLINIC Patient taking differently: Take 2.5-5 mg by mouth See admin instructions. Take 2.5 mg at night on Mon, Wed, Fri, and Sat.  Take 5 mg at night on Tues, Thur, and Sun. 01/26/19  Yes Croitoru, Dani Gobble, MD    Family History Family History  Problem Relation Age of Onset   Colon cancer Mother    Alzheimer's disease Father    Heart disease Father    Hypertension Sister    Stroke Brother    Hypertension Brother    Heart attack Brother    Heart disease Brother    Hypertension Brother    Diabetes Brother    Coronary artery disease Other    Stomach cancer Neg Hx    Pancreatic cancer Neg Hx      Social History Social History   Tobacco Use   Smoking status: Former Smoker    Years: 1.00    Types: Cigarettes    Quit date: 12/18/1963    Years since quitting: 55.6   Smokeless tobacco: Former Systems developer    Quit date: 10/03/2012   Tobacco comment: quit smoking in 1970's  Substance  Use Topics   Alcohol use: Yes    Alcohol/week: 1.0 standard drinks    Types: 1 Cans of beer per week    Comment: rare   Drug use: Yes    Types: Marijuana    Comment: sometimes (down from every day)     Allergies   Patient has no known allergies.   Review of Systems Review of Systems  Constitutional: Negative for chills, diaphoresis and fever.  Respiratory: Positive for shortness of breath. Negative for cough.   Cardiovascular: Positive for chest pain. Negative for leg swelling.  Gastrointestinal: Negative for abdominal pain, diarrhea, nausea and vomiting.  Musculoskeletal: Negative for back pain.  Neurological: Negative for dizziness, syncope, weakness and light-headedness.  All other systems reviewed and are negative.    Physical Exam Updated Vital Signs BP (!) 150/108 (BP Location: Right Arm)    Pulse 60    Resp 17    SpO2 97%   Physical Exam Vitals signs and nursing note reviewed.  Constitutional:      General: He is not in acute distress.    Appearance: He is well-developed. He is not diaphoretic.  HENT:     Head: Normocephalic and atraumatic.     Mouth/Throat:     Mouth: Mucous membranes are moist.     Pharynx: Oropharynx is clear.  Eyes:     Conjunctiva/sclera: Conjunctivae normal.  Neck:     Musculoskeletal: Neck supple.  Cardiovascular:     Rate and Rhythm: Normal rate and regular rhythm.     Pulses: Normal pulses.          Radial pulses are 2+ on the right side and 2+ on the left side.       Posterior tibial pulses are 2+ on the right side and 2+ on the left side.     Heart sounds: Normal heart sounds.     Comments: Tactile temperature in the extremities  appropriate and equal bilaterally. Pulmonary:     Effort: Pulmonary effort is normal. No respiratory distress.     Breath sounds: Normal breath sounds.     Comments: No increased work of breathing.  Speaks in full sentences without noted difficulty. Abdominal:     Palpations: Abdomen is soft.     Tenderness: There is no abdominal tenderness. There is no guarding.  Musculoskeletal:     Right lower leg: No edema.     Left lower leg: No edema.  Lymphadenopathy:     Cervical: No cervical adenopathy.  Skin:    General: Skin is warm and dry.  Neurological:     Mental Status: He is alert.  Psychiatric:        Mood and Affect: Mood and affect normal.        Speech: Speech normal.        Behavior: Behavior normal.      ED Treatments / Results  Labs (all labs ordered are listed, but only abnormal results are displayed) Labs Reviewed  BASIC METABOLIC PANEL - Abnormal; Notable for the following components:      Result Value   Glucose, Bld 145 (*)    All other components within normal limits  PROTIME-INR - Abnormal; Notable for the following components:   Prothrombin Time 24.8 (*)    INR 2.3 (*)    All other components within normal limits  TROPONIN I (HIGH SENSITIVITY) - Abnormal; Notable for the following components:   Troponin I (High Sensitivity) 39 (*)    All other components within normal  limits  TROPONIN I (HIGH SENSITIVITY) - Abnormal; Notable for the following components:   Troponin I (High Sensitivity) 42 (*)    All other components within normal limits  SARS CORONAVIRUS 2 (TAT 6-24 HRS)  CBC  PLATELET INHIBITION P2Y12    EKG EKG Interpretation  Date/Time:  Monday July 27 2019 10:05:32 EST Ventricular Rate:  60 PR Interval:    QRS Duration: 178 QT Interval:  474 QTC Calculation: 474 R Axis:   -82 Text Interpretation: Ventricular-paced rhythm Abnormal ECG Paced rhythm, no significant changes from July 2020 ecg, does not meet sgarbossa criteria, no STEMI  Confirmed by Octaviano Glow [phone]) on 07/27/2019 10:22:22 AM   EKG Interpretation  Date/Time:  Monday July 27 2019 13:48:26 EST Ventricular Rate:  70 PR Interval:    QRS Duration: 85 QT Interval:  385 QTC Calculation: 407 R Axis:   58 Text Interpretation: Afib/flut and V-paced complexes No further analysis attempted due to paced rhythm No significant changes from prior ecg, possible A flutter pattern, no STEMI Confirmed by Octaviano Glow [phone]) on 07/27/2019 1:54:53 PM       Radiology Dg Chest 2 View  Result Date: 07/27/2019 CLINICAL DATA:  Chest pain. CABG. EXAM: CHEST - 2 VIEW COMPARISON:  03/25/2019 FINDINGS: The heart size and pulmonary vascularity are normal and the lungs are clear. Slight prominence of the upper left heart border is unchanged and may represent an enlarged left atrial appendage. No effusions. CABG. Pacemaker in place. No bone abnormality. IMPRESSION: No active cardiopulmonary disease. Electronically Signed   By: Lorriane Shire M.D.   On: 07/27/2019 10:28    Procedures Procedures (including critical care time)  Medications Ordered in ED Medications  phytonadione (VITAMIN K) tablet 5 mg (has no administration in time range)  clonazePAM (KLONOPIN) tablet 0.5 mg (has no administration in time range)  clopidogrel (PLAVIX) tablet 75 mg (has no administration in time range)  diltiazem (DILACOR XR) 24 hr capsule 180 mg (has no administration in time range)  doxylamine (Sleep) (UNISOM) tablet 50 mg (has no administration in time range)  fish oil-omega-3 fatty acids capsule 2 g (has no administration in time range)  nitroGLYCERIN 50 mg in dextrose 5 % 250 mL (0.2 mg/mL) infusion (has no administration in time range)  lisinopril (ZESTRIL) tablet 20 mg (has no administration in time range)  metoprolol tartrate (LOPRESSOR) tablet 100 mg (has no administration in time range)  potassium chloride (KLOR-CON) CR tablet 10 mEq (has no administration in time range)   rosuvastatin (CRESTOR) tablet 40 mg (has no administration in time range)  venlafaxine XR (EFFEXOR-XR) 24 hr capsule 150 mg (has no administration in time range)  venlafaxine XR (EFFEXOR-XR) 24 hr capsule 75 mg (has no administration in time range)  sodium chloride flush (NS) 0.9 % injection 3 mL (3 mLs Intravenous Given 07/27/19 1354)  morphine 4 MG/ML injection 4 mg (4 mg Intravenous Given 07/27/19 1406)  ondansetron (ZOFRAN) injection 4 mg (4 mg Intravenous Given 07/27/19 1406)     Initial Impression / Assessment and Plan / ED Course  I have reviewed the triage vital signs and the nursing notes.  Pertinent labs & imaging results that were available during my care of the patient were reviewed by me and considered in my medical decision making (see chart for details).  Clinical Course as of Jul 27 1555  Mon Jul 27, 2019  1313 Spoke with Wannetta Sender, Water engineer. States she will page one of cardiologists.   [SJ]  1353 Repeat trop  42   [MT]  [id number] with Trish again. Have not heard from cardiologist (possible this may have been due to an issue with my phone). She states she will have someone from their team come down in the next few minutes.    [SJ]  [id number] Patient was seen by myself as well as PA provider.  Briefly this 63 year old male with a history of quadruple bypass in July 2020, pacemaker, on Coumadin, presenting to emergency department chest pain.  Says it woke him from sleep around 5 AM.  He had shortness of breath and a pressure sensation on his chest.  He says the pressure sensation is similar to his prior cardiac events in July.  He took a sublingual nitro which did relieve his pressure somewhat but not completely.  Currently his pressure is 2 out of 10.  He is otherwise stable.  His EKG was performed 3 times today, and shows intermittent paced ventricular beats, as well as an A. fib or a flutter type pattern.  He does not meet [place]'s criteria for STEMI.  His initial troponin was  39 and repeat at 42 .  A consult was placed with cardiology and will come down to see the patient.  No heparin was initiated on arrival given his therapeutic INR on coumadin.     [MT]    Clinical Course User Index [MT] Trifan, Carola Rhine, MD [SJ] Lorayne Bender, PA-C       Patient presents with chest pain beginning this morning.  Pain was persistent at rest, improved with nitroglycerin. Troponins of 39, then 42.  No acute changes across two EKGs here in the ED.  Due to the patient's pacemaker, it was difficult to truly tell if there were inferior lead abnormalities.  For this reason, repeat nitroglycerin was held until cardiology was able to evaluate the patient. Heparin not started due to patient's therapeutic INR on warfarin. Patient admitted via cardiology service.  Findings and plan of care discussed with Octaviano Glow, MD. Dr. Langston Masker personally evaluated and examined this patient.  Vitals:   07/27/19 1245 07/27/19 1315 07/27/19 1345 07/27/19 1400  BP: (!) 146/93 (!) 151/95 (!) 143/91 (!) 143/90  Pulse: (!) 58 60 61 (!) 59  Resp: 13 14 13 14   SpO2: 98% 97% 97% 98%      Final Clinical Impressions(s) / ED Diagnoses   Final diagnoses:  Unstable angina [facility])    ED Discharge Orders    None       Layla Maw 07/27/19 1558    Wyvonnia Dusky, MD 07/29/19 1220

## 2019-07-27 NOTE — H&P (Addendum)
Cardiology Admission History and Physical:   Patient ID: LUCAS KILBARGER; MRN: [id number]; DOB: [birth year]   Admission date: 07/27/2019  Primary Care Provider: Patient, No Pcp Per Primary Cardiologist: [name], MD 06/10/2019 Primary Electrophysiologist: None    Chief Complaint: Chest pain  Patient Profile:   Lucas Porter is a 63 y.o. male with a history of CABG 2010, DES Lmain/CFX 2013, DES SVG-Diag & SVG-dCFX 03/2019 for USAP, persistent A. 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  History of Present Illness:   Mr. Mazmanian has been doing well since last intervention 03/2019.  He was active around the house and not having chest pains or shortness of breath.  Today he was awakened at 5 AM by substernal chest pain.  It was a tightness or pressure, reached a 9/10.  His usual angina.  This was associated with shortness of breath which is unusual.  He also had some mild diaphoresis.    He waited to see if the pain would go away, but it did not.  He took a nitroglycerin about 7 AM.  The pain improved, but did not resolve.  He called his wife who came home and they called 911.  He took 2 baby aspirin at home and was given 2 more by EMS.  In the emergency room, he has had morphine and Zofran.  He is still having chest pain, but it is down to a 2-3/10.  This is the first episode of pain he has had since his stents in July.  He has otherwise been doing well.  He has not had palpitations, no presyncope or syncope.  He was not aware that he is in A. fib all the time and that he is pacing regularly.  He never feels it.  He has not missed any doses of his Tikosyn or his Coumadin.   Past Medical History:  Diagnosis Date  . Anxiety   . Atrial fibrillation (HCC)    paroxysmal  . Atrial flutter ([place])   . Coagulopathy ([place])    persistent elevation of ACT during 10/17/11 hospitalization  . Coronary artery disease    s/p  CABG 2010, s/p Successful PCI  10/17/11 of Distal Left Main and the Left Circumflex with a Promus Element DES stent -- 4.0 mm x 16 mm postdilated to 4.12 mm   . DDD (degenerative disc disease)   . Depression   . Dysrhythmia    afib  . Facial asymmetry 2010   pt. stated it was very temporary, never seen by doctor for it.   Marland Kitchen GERD (gastroesophageal reflux disease)   . Heart murmur   . Hyperlipidemia   . Hypertension   . Kidney stones   . Presence of permanent cardiac pacemaker    Medtronic   . S/P coronary artery stent placement to distal Lt. main and Lt. Cx 10/17/11  . 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 ATRIAL FIBRILLATION ABLATION  04/24/12, 12/02/12   PVI x2 and CTI ablation by Dr Rayann Heman  . ATRIAL FIBRILLATION ABLATION N/A 04/24/2012   Procedure: ATRIAL FIBRILLATION ABLATION;  Surgeon: Thompson Grayer, MD;  Location: [facility] CATH LAB;  Service: Cardiovascular;  Laterality: N/A;  . ATRIAL FIBRILLATION ABLATION N/A 12/02/2012   Procedure: ATRIAL FIBRILLATION ABLATION;  Surgeon: Thompson Grayer, MD;  Location: [facility] CATH LAB;  Service: Cardiovascular;  Laterality: N/A;  .  CORONARY ANGIOPLASTY  10/19/11  . CORONARY ARTERY BYPASS GRAFT  2010  . CORONARY STENT INTERVENTION N/A 04/13/2019   Procedure: CORONARY STENT INTERVENTION;  Surgeon: Nelva Bush, MD;  Location: [place] CV LAB;  Service: Cardiovascular;  Laterality: N/A;  . CYSTOSCOPY  2007 ?   treatment for Kidney stones   . INGUINAL HERNIA REPAIR Left 01/04/2016   Procedure: LAPAROSCOPIC LEFT INGUINAL HERNIA WITH MESH;  Surgeon: Ralene Ok, MD;  Location: [place];  Service: General;  Laterality: Left;  . INSERT / REPLACE / REMOVE PACEMAKER  2/12   Medtronic  . INSERTION OF MESH Left 01/04/2016   Procedure: INSERTION OF MESH;  Surgeon: Ralene Ok, MD;  Location: [place];  Service: General;  Laterality: Left;  . LAPAROSCOPIC LYSIS OF ADHESIONS  Left 01/04/2016   Procedure: LAPAROSCOPIC LYSIS OF ADHESIONS;  Surgeon: Ralene Ok, MD;  Location: [place];  Service: General;  Laterality: Left;  . LEFT HEART CATH AND CORS/GRAFTS ANGIOGRAPHY N/A 02/20/2019   Procedure: LEFT HEART CATH AND CORS/GRAFTS ANGIOGRAPHY;  Surgeon: Belva Crome, MD;  Location: [place] CV LAB;  Service: Cardiovascular;  Laterality: N/A;  . LEFT HEART CATH AND CORS/GRAFTS ANGIOGRAPHY N/A 04/13/2019   Procedure: LEFT HEART CATH AND CORS/GRAFTS ANGIOGRAPHY;  Surgeon: Nelva Bush, MD;  Location: [place] CV LAB;  Service: Cardiovascular;  Laterality: N/A;  . LEFT HEART CATHETERIZATION WITH CORONARY/GRAFT ANGIOGRAM N/A 10/17/2011   Procedure: LEFT HEART CATHETERIZATION WITH Beatrix Fetters;  Surgeon: [name], MD;  Location: [place] CATH LAB;  Service: Cardiovascular;  Laterality: N/A;  . LEFT HEART CATHETERIZATION WITH CORONARY/GRAFT ANGIOGRAM N/A 02/08/2012   Procedure: LEFT HEART CATHETERIZATION WITH Beatrix Fetters;  Surgeon: [name], MD;  Location: [place] CATH LAB;  Service: Cardiovascular;  Laterality: N/A;  . NM MYOCAR PERF WALL MOTION  03/23/2011   Low risk  . PERCUTANEOUS CORONARY STENT INTERVENTION (PCI-S)  10/17/2011   Procedure: PERCUTANEOUS CORONARY STENT INTERVENTION (PCI-S);  Surgeon: [name], MD;  Location: [facility] CATH LAB;  Service: Cardiovascular;;  . PPM GENERATOR CHANGEOUT N/A 02/20/2019   Procedure: PPM GENERATOR CHANGEOUT;  Surgeon: [name], MD;  Location: [place] CV LAB;  Service: Cardiovascular;  Laterality: N/A;  . TEE WITHOUT CARDIOVERSION  04/23/2012   Procedure: TRANSESOPHAGEAL ECHOCARDIOGRAM (TEE);  Surgeon: Josue Hector, MD;  Location: [facility] ENDOSCOPY;  Service: Cardiovascular;  Laterality: N/A;  spoke to pt about time change from 11a to 12p/DL  . TEE WITHOUT CARDIOVERSION N/A 12/01/2012   Procedure: TRANSESOPHAGEAL ECHOCARDIOGRAM (TEE);  Surgeon: [name], MD;  Location[facility] ENDOSCOPY;  Service:  Cardiovascular;  Laterality: N/A;     Medications Prior to Admission: Prior to Admission medications   Medication Sig Start Date End Date Taking? Authorizing Provider  acetaminophen (TYLENOL) 500 MG tablet Take 1,000 mg by mouth every 6 (six) hours as needed for moderate pain.    [provider]  atenolol (TENORMIN) 100 MG tablet TAKE 1 TABLET BY MOUTH ONCE DAILY Patient taking differently: Take 100 mg by mouth daily.  01/01/18   Harrison Mons, PA  clonazePAM (KLONOPIN) 0.5 MG tablet Take 1 tablet (0.5 mg total) by mouth 2 (two) times daily as needed for anxiety. 12/13/14   Copland, Gay Filler, MD  clopidogrel (PLAVIX) 75 MG tablet Take 1 tablet (75 mg total) by mouth daily. 05/05/19   Croitoru, Mihai, MD  DILT-XR 180 MG 24 hr capsule Take 1 capsule by mouth once daily 05/05/19   Croitoru, Mihai, MD  dofetilide (TIKOSYN) 500 MCG capsule Take 1 capsule  by mouth twice daily 04/15/19   Croitoru, Mihai, MD  Doxylamine Succinate, Sleep, (SLEEP AID PO) Take 1 tablet by mouth at bedtime as needed (sleep).    [provider]  fish oil-omega-3 fatty acids 1000 MG capsule Take 2 g by mouth daily.     [provider]  isosorbide mononitrate (IMDUR) 30 MG 24 hr tablet Take 3 tablets (90 mg total) by mouth daily. 05/20/19 08/18/19  Croitoru, Mihai, MD  lisinopril (ZESTRIL) 20 MG tablet Take 1 tablet (20 mg total) by mouth daily. 04/16/19   Croitoru, Mihai, MD  metFORMIN (GLUCOPHAGE) 500 MG tablet Take 1 tablet (500 mg total) by mouth 2 (two) times daily with a meal. 06/18/18   Stallings, Zoe A, MD  nitroGLYCERIN (NITROSTAT) 0.4 MG SL tablet Place 1 tablet (0.4 mg total) under the tongue every 5 (five) minutes as needed for up to 10 days for chest pain. 03/25/19 04/09/19  Janeece Fitting, PA-C  potassium chloride (K-DUR) 10 MEQ tablet TAKE 1  BY MOUTH ONCE DAILY 04/13/19   Croitoru, Mihai, MD  rosuvastatin (CRESTOR) 40 MG tablet TAKE 1 TABLET BY MOUTH ONCE DAILY Patient taking differently: Take 40  mg by mouth daily.  11/01/17   Harrison Mons, PA  venlafaxine XR (EFFEXOR-XR) 150 MG 24 hr capsule TAKE ONE CAPSULE BY MOUTH ONCE DAILY WITH  THE  75  MG  TO  TOTAL  225  MG  DAILY Patient taking differently: Take 225 mg by mouth See admin instructions. TAKE ONE CAPSULE BY MOUTH ONCE DAILY WITH  THE  75  MG  TO  TOTAL  225  MG  DAILY 07/12/17   Jeffery, [place], PA  venlafaxine XR (EFFEXOR-XR) 75 MG 24 hr capsule TAKE ONE CAPSULE BY MOUTH ONCE DAILY TAKE  WITH  150MG   TO  EQUAL  A  TOTAL  OF  225MG   DAILY Patient taking differently: Take 225 mg by mouth See admin instructions. 75 mg and 225 mg daily 07/12/17   Harrison Mons, PA  warfarin (COUMADIN) 5 MG tablet TAKE 1/2 TO 1 (ONE-HALF TO ONE) TABLET BY MOUTH ONCE DAILY AS DIRECTED BY COUMADIN CLINIC Patient taking differently: Take 2.5-5 mg by mouth See admin instructions. Take 2.5 mg at night on Wed, Fri, Sat, and Mon.  Take 5 mg at night on Thur, Sun, and Tue. 01/26/19   Croitoru, Dani Gobble, MD     Allergies:   No Known Allergies  Social History:   Social History   Socioeconomic History  . Marital status: Married    Spouse name: Morey Hummingbird  . Number of children: 2  . Years of education: 10  . Highest education level: 10th grade  Occupational History  . Occupation: disability-heart disease    Comment: Investment banker, corporate  Social Needs  . Financial resource strain: Not on file  . Food insecurity    Worry: Not on file    Inability: Not on file  . Transportation needs    Medical: Not on file    Non-medical: Not on file  Tobacco Use  . Smoking status: Former Smoker    Years: 1.00    Types: Cigarettes    Quit date: 12/18/1963    Years since quitting: 55.6  . Smokeless tobacco: Former Systems developer    Quit date: 10/03/2012  . Tobacco comment: quit smoking in 1970's  Substance and Sexual Activity  . Alcohol use: Yes    Alcohol/week: 1.0 standard drinks    Types: 1 Cans of beer per week  Comment: rare  . Drug use: Yes    Types: Marijuana     Comment: sometimes (down from every day)  . Sexual activity: Yes  Lifestyle  . Physical activity    Days per week: Not on file    Minutes per session: Not on file  . Stress: Not on file  Relationships  . Social Herbalist on phone: Not on file    Gets together: Not on file    Attends religious service: Not on file    Active member of club or organization: Not on file    Attends meetings of clubs or organizations: Not on file    Relationship status: Not on file  . Intimate partner violence    Fear of current or ex partner: Not on file    Emotionally abused: Not on file    Physically abused: Not on file    Forced sexual activity: Not on file  Other Topics Concern  . Not on file  Social History Narrative   Pt lives in [place] with spouse.     2 grown daughters.     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   Caffeine intake daily     Family History:  The patient's family history includes Alzheimer's disease in his father; Colon cancer in his mother; Coronary artery disease in an other family member; Diabetes in his brother; Heart attack in his brother; Heart disease in his brother and father; Hypertension in his brother, brother, and sister; Stroke in his brother. There is no history of Stomach cancer or Pancreatic cancer.   The patient He indicated that his mother is deceased. He indicated that his father is deceased. He indicated that his sister is alive. He indicated that only one of his two brothers is alive. He indicated that both of his daughters are alive. He indicated that the status of his neg hx is unknown. He indicated that the status of his other is unknown.   ROS:  Please see the history of present illness.  All other ROS reviewed and negative.     Physical Exam/Data:   Vitals:   07/27/19 1345 07/27/19 1400 07/27/19 1430 07/27/19 1442  BP: (!) 143/91 (!) 143/90 (!) 156/97   Pulse: 61 (!) 59 60   Resp: 13 14 20    Temp:    97.8 F  (36.6 C)  TempSrc:    Oral  SpO2: 97% 98% 97%    No intake or output data in the 24 hours ending 07/27/19 1449 There were no vitals filed for this visit. There is no height or weight on file to calculate BMI.  General:  Well nourished, well developed, male in no acute distress HEENT: normal Lymph: no adenopathy Neck:  JVD not elevated Endocrine:  No thryomegaly Vascular: No carotid bruits; 4/4 extremity pulses 2+ bilaterally  Cardiac:  normal S1, S2; RRR; no significant murmur, no rub or gallop  Lungs:  clear to auscultation bilaterally, no wheezing, rhonchi or rales  Abd: soft, nontender, no hepatomegaly  Ext: No edema Musculoskeletal:  No deformities, BUE and BLE strength normal and equal Skin: warm and dry  Neuro:  CNs 2-12 intact, no focal abnormalities noted Psych:  Normal affect    EKG:  The ECG was personally reviewed: 11/09 ECG is atrial fibrillation, mostly paced beats, but some intrinsic beats and occasional PVCs.  Heart rate 70  Relevant CV Studies:  ECHO: 05/31/2014 - Left ventricle: The cavity size was  normal. Wall thickness was  increased in a pattern of mild LVH. Systolic function was normal.  The estimated ejection fraction was in the range of 55% to 60%.  - Mitral valve: There was mild regurgitation.  - Left atrium: The atrium was mildly dilated.   Generator change out: 02/20/2019 New Generator Medtronic Azure XT model number [id number], serial number [id number]  Left heart cath 04/13/2019: Conclusions: 1. Significant multivessel coronary artery disease, as detailed in Dr. Thompson Caul diagnostic catheterization report from 02/2019, including 80% SVG-D stenosis, 50-60% proximal LCx disease, and 80-90% distal LCx stenosis. 2. Moderately elevated left ventricular filling pressure (LVEDP 25-30 mmHg). 3. Successful PCI to SVG-D using Resolute Onyx 3.0 x 12 mm drug-eluting stent with 0% residual stenosis and TIMI-3 flow. 4. Successful PCI to distal LCx using Resolute Onyx  2.5 x 18 mm drug-eluting stent with 0% residual stenosis ant TIMI-3 flow.  Recommendations: 1. Restart warfarin tonight. Recommend aspirin, clopidogrel, and warfarin x 1 month, after which time aspirin can be discontinued. Warfarin and clopidogrel should be continued for at least 12 month, ideally longer if tolerated. 2. Consider gentle diuresis later today or tomorrow, as renal function allows, given moderately elevated LVEDP. 3. Aggressive secondary prevention. 4. Overnight extended recovery; anticipate discharge home tomorrow. Intervention    Laboratory Data:  Chemistry Recent Labs  Lab 07/27/19 1009  NA 138  K 4.3  CL 101  CO2 25  GLUCOSE 145*  BUN 11  CREATININE 0.83  CALCIUM 9.2  GFRNONAA >60  GFRAA >60  ANIONGAP 12    No results for input(s): PROT, ALBUMIN, AST, ALT, ALKPHOS, BILITOT in the last 168 hours. Hematology Recent Labs  Lab 07/27/19 1009  WBC 8.8  RBC 5.07  HGB 14.1  HCT 43.5  MCV 85.8  MCH 27.8  MCHC 32.4  RDW 13.8  PLT 218   Cardiac Enzymes  High Sensitivity Troponin:   Recent Labs  Lab 07/27/19 1009 07/27/19 1207  TROPONINIHS 39* 42*     Lab Results  Component Value Date   INR 2.3 (H) 07/27/2019   INR 2.2 06/10/2019   INR 2.3 06/01/2019   Lab Results  Component Value Date   CHOL 117 04/09/2019   HDL 40 04/09/2019   LDLCALC 26 04/09/2019   TRIG 253 (H) 04/09/2019   CHOLHDL 2.9 04/09/2019   Lab Results  Component Value Date   TSH 1.870 11/05/2017   Lab Results  Component Value Date   HGBA1C 6.9 (H) 04/09/2019    Radiology/Studies:  Dg Chest 2 View  Result Date: 07/27/2019 CLINICAL DATA:  Chest pain. CABG. EXAM: CHEST - 2 VIEW COMPARISON:  03/25/2019 FINDINGS: The heart size and pulmonary vascularity are normal and the lungs are clear. Slight prominence of the upper left heart border is unchanged and may represent an enlarged left atrial appendage. No effusions. CABG. Pacemaker in place. No bone abnormality. IMPRESSION:  No active cardiopulmonary disease. Electronically Signed   By: Lorriane Shire M.D.   On: 07/27/2019 10:28    Assessment and Plan:   1.  Unstable anginal pain: -He has not had a Covid test, but the nurse is aware to get 1 -He has had no fevers or chills, no cough or cold symptoms so it is unlikely that he will be positive -His symptoms were improved by nitroglycerin, will add IV nitrates -His INR is currently therapeutic, hold Coumadin and start heparin when his INR is less than 2.0 -No echo since 2005, order -Discuss cath with MD  2.  Borderline diabetes -In July, his A1c was slightly elevated, recheck -His triglycerides were elevated as well -He may benefit from a diabetic diet  3.  Hypertension: -Blood pressure is elevated today, but I do not believe he has taken his usual medications -Restart home meds  Principal Problem:   Unstable angina ([place]) Active Problems:   Hypertension   For questions or updates, please contact [place] Please consult [email or web address] for contact info under Cardiology/STEMI.    SignedRosaria Ferries, PA-C  07/27/2019 2:49 PM   Patient seen, examined. Available data reviewed. Agree with findings, assessment, and plan as outlined by Calton Golds, PA-C.  The patient is independently interviewed and examined.  His wife is at the bedside.  He awoke this morning with substernal chest pressure consistent with his typical angina.  His pain lasted several hours.  He is now chest pain-free after receiving morphine.  The patient has minimally elevated troponin now on 2 subsequent lab test.  We cannot interpret his EKG as he has a paced rhythm.  I reviewed his previous catheterization studies, most recently from July 2020 when he underwent PCI of a saphenous vein graft and PCI of the distal circumflex.  His clinical presentation is highly consistent with non-STEMI/ACS and I have recommended proceeding directly with cardiac catheterization.  He has had left  radial artery harvesting for coronary bypass so he will need to be done from the femoral approach.  His INR is supratherapeutic and I will give him vitamin K orally today.  He will be started on IV heparin. I have reviewed the risks, indications, and alternatives to cardiac catheterization, possible angioplasty, and stenting with the patient. Risks include but are not limited to bleeding, infection, vascular injury, stroke, myocardial infection, arrhythmia, kidney injury, radiation-related injury in the case of prolonged fluoroscopy use, emergency cardiac surgery, and death. The patient understands the risks of serious complication is 1-2 in [id number] with diagnostic cardiac cath and 1-2% or less with angioplasty/stenting. He will be placed on the cath schedule for tomorrow.   Sherren Mocha, M.D. 07/27/2019 3:59 PM

## 2019-07-27 NOTE — ED Notes (Signed)
Dinner tray ordered.

## 2019-07-27 NOTE — ED Notes (Signed)
Cardiology called concerning no bed request listed, placed at this time by on-call

## 2019-07-27 NOTE — ED Triage Notes (Signed)
Pt here from home with c/o chest pressure  that started  around 5 am , pt took 1 nitro with some releif and ems gave 324 mg asa , pt history of stents and cabg

## 2019-07-27 NOTE — ED Notes (Signed)
Pacemaker interrogated - medtronic

## 2019-07-28 ENCOUNTER — Inpatient Hospital Stay (HOSPITAL_COMMUNITY): Payer: BC Managed Care – PPO

## 2019-07-28 ENCOUNTER — Encounter (HOSPITAL_COMMUNITY)
Admission: EM | Disposition: A | Discharge status: Home / Self Care | Payer: Self-pay | Source: Home / Self Care | Attending: Cardiovascular Disease

## 2019-07-28 ENCOUNTER — Other Ambulatory Visit: Payer: Self-pay

## 2019-07-28 ENCOUNTER — Encounter (HOSPITAL_COMMUNITY): Payer: Self-pay | Admitting: General Practice

## 2019-07-28 DIAGNOSIS — I200 Unstable angina: Secondary | ICD-10-CM

## 2019-07-28 DIAGNOSIS — I4819 Other persistent atrial fibrillation: Secondary | ICD-10-CM

## 2019-07-28 DIAGNOSIS — I25710 Atherosclerosis of autologous vein coronary artery bypass graft(s) with unstable angina pectoris: Secondary | ICD-10-CM

## 2019-07-28 DIAGNOSIS — I340 Nonrheumatic mitral (valve) insufficiency: Secondary | ICD-10-CM

## 2019-07-28 DIAGNOSIS — I25110 Atherosclerotic heart disease of native coronary artery with unstable angina pectoris: Principal | ICD-10-CM

## 2019-07-28 HISTORY — PX: CORONARY STENT INTERVENTION: CATH118234

## 2019-07-28 HISTORY — PX: LEFT HEART CATH AND CORS/GRAFTS ANGIOGRAPHY: CATH118250

## 2019-07-28 LAB — COMPREHENSIVE METABOLIC PANEL
ALT: 23 U/L (ref 0–44)
AST: 27 U/L (ref 15–41)
Albumin: 3.7 g/dL (ref 3.5–5.0)
Alkaline Phosphatase: 65 U/L (ref 38–126)
Anion gap: 13 (ref 5–15)
BUN: 15 mg/dL (ref 8–23)
CO2: 20 mmol/L — ABNORMAL LOW (ref 22–32)
Calcium: 8.9 mg/dL (ref 8.9–10.3)
Chloride: 102 mmol/L (ref 98–111)
Creatinine, Ser: 0.79 mg/dL (ref 0.61–1.24)
GFR calc Af Amer: >60 mL/min (ref >60)
GFR calc non Af Amer: >60 mL/min (ref >60)
Glucose, Bld: 138 mg/dL — ABNORMAL HIGH (ref 70–99)
Potassium: 4.7 mmol/L (ref 3.5–5.1)
Sodium: 135 mmol/L (ref 135–145)
Total Bilirubin: 1.3 mg/dL — ABNORMAL HIGH (ref 0.3–1.2)
Total Protein: 6.3 g/dL — ABNORMAL LOW (ref 6.5–8.1)

## 2019-07-28 LAB — GLUCOSE, CAPILLARY
Glucose-Capillary: 122 mg/dL — ABNORMAL HIGH (ref 70–99)
Glucose-Capillary: 91 mg/dL (ref 70–99)
Glucose-Capillary: 92 mg/dL (ref 70–99)

## 2019-07-28 LAB — HIV ANTIBODY (ROUTINE TESTING W REFLEX): HIV Screen 4th Generation wRfx: NONREACTIVE

## 2019-07-28 LAB — TROPONIN I (HIGH SENSITIVITY): Troponin I (High Sensitivity): 36 ng/L — ABNORMAL HIGH (ref <18)

## 2019-07-28 LAB — PROTIME-INR
INR: 1.7 — ABNORMAL HIGH (ref 0.8–1.2)
Prothrombin Time: 19.9 seconds — ABNORMAL HIGH (ref 11.4–15.2)

## 2019-07-28 LAB — ECHOCARDIOGRAM COMPLETE: Weight: 3597.91 oz

## 2019-07-28 LAB — POCT ACTIVATED CLOTTING TIME
Activated Clotting Time: 0 seconds
Activated Clotting Time: 648 seconds
Activated Clotting Time: 725 seconds
Activated Clotting Time: 477 seconds
Activated Clotting Time: 412 seconds

## 2019-07-28 LAB — HEMOGLOBIN A1C
Hgb A1c MFr Bld: 7.2 % — ABNORMAL HIGH (ref 4.8–5.6)
Mean Plasma Glucose: 159.94 mg/dL

## 2019-07-28 LAB — CBG MONITORING, ED: Glucose-Capillary: 137 mg/dL — ABNORMAL HIGH (ref 70–99)

## 2019-07-28 SURGERY — LEFT HEART CATH AND CORS/GRAFTS ANGIOGRAPHY
Anesthesia: LOCAL

## 2019-07-28 MED ORDER — HEPARIN SODIUM (PORCINE) 1000 UNIT/ML IJ SOLN
INTRAMUSCULAR | Status: AC
  Filled 2019-07-28: qty 1

## 2019-07-28 MED ORDER — MIDAZOLAM HCL 2 MG/2ML IJ SOLN
INTRAMUSCULAR | Status: DC | PRN
  Administered 2019-07-28: 2 mg via INTRAVENOUS

## 2019-07-28 MED ORDER — FENTANYL CITRATE (PF) 100 MCG/2ML IJ SOLN
INTRAMUSCULAR | Status: DC | PRN
  Administered 2019-07-28: 25 ug via INTRAVENOUS
  Administered 2019-07-28: 50 ug via INTRAVENOUS

## 2019-07-28 MED ORDER — NITROGLYCERIN 1 MG/10 ML FOR IR/CATH LAB
INTRA_ARTERIAL | Status: AC
  Filled 2019-07-28: qty 10

## 2019-07-28 MED ORDER — SODIUM CHLORIDE 0.9% FLUSH: 3.0000 mL | Freq: Two times a day (BID) | INTRAVENOUS | Status: DC

## 2019-07-28 MED ORDER — HEPARIN (PORCINE) IN NACL 1000-0.9 UT/500ML-% IV SOLN
INTRAVENOUS | Status: AC
  Filled 2019-07-28: qty 500

## 2019-07-28 MED ORDER — NITROGLYCERIN 1 MG/10 ML FOR IR/CATH LAB
INTRA_ARTERIAL | Status: DC | PRN
  Administered 2019-07-28: 200 ug via INTRACORONARY

## 2019-07-28 MED ORDER — HEPARIN (PORCINE) 25000 UT/250ML-% IV SOLN
1200.0000 [IU]/h | INTRAVENOUS | Status: DC
  Administered 2019-07-28: 1200 [IU]/h via INTRAVENOUS
  Filled 2019-07-28: qty 250

## 2019-07-28 MED ORDER — NITROGLYCERIN 0.4 MG SL SUBL: 0.4000 mg | SUBLINGUAL_TABLET | SUBLINGUAL | Status: DC | PRN

## 2019-07-28 MED ORDER — ASPIRIN 81 MG PO CHEW
81.0000 mg | CHEWABLE_TABLET | Freq: Every day | ORAL | Status: DC
  Administered 2019-07-29: 81 mg via ORAL
  Filled 2019-07-28: qty 1

## 2019-07-28 MED ORDER — HEPARIN SODIUM (PORCINE) 1000 UNIT/ML IJ SOLN
INTRAMUSCULAR | Status: DC | PRN
  Administered 2019-07-28: 12000 [IU] via INTRAVENOUS

## 2019-07-28 MED ORDER — HYDRALAZINE HCL 20 MG/ML IJ SOLN
INTRAMUSCULAR | Status: AC
  Filled 2019-07-28: qty 1

## 2019-07-28 MED ORDER — FENTANYL CITRATE (PF) 100 MCG/2ML IJ SOLN
INTRAMUSCULAR | Status: AC
  Filled 2019-07-28: qty 2

## 2019-07-28 MED ORDER — SODIUM CHLORIDE 0.9 % IV SOLN: 250.0000 mL | INTRAVENOUS | Status: DC | PRN

## 2019-07-28 MED ORDER — ATROPINE SULFATE 1 MG/10ML IJ SOSY
PREFILLED_SYRINGE | INTRAMUSCULAR | Status: AC
  Filled 2019-07-28: qty 10

## 2019-07-28 MED ORDER — SODIUM CHLORIDE 0.9% FLUSH: 3.0000 mL | INTRAVENOUS | Status: DC | PRN

## 2019-07-28 MED ORDER — ACETAMINOPHEN 500 MG PO TABS: 1000.0000 mg | ORAL_TABLET | Freq: Four times a day (QID) | ORAL | Status: DC | PRN

## 2019-07-28 MED ORDER — ONDANSETRON HCL 4 MG/2ML IJ SOLN: 4.0000 mg | Freq: Four times a day (QID) | INTRAMUSCULAR | Status: DC | PRN

## 2019-07-28 MED ORDER — ACETAMINOPHEN 325 MG PO TABS: 650.0000 mg | ORAL_TABLET | ORAL | Status: DC | PRN

## 2019-07-28 MED ORDER — LIDOCAINE HCL (PF) 1 % IJ SOLN
INTRAMUSCULAR | Status: DC | PRN
  Administered 2019-07-28: 15 mL

## 2019-07-28 MED ORDER — ACETAMINOPHEN 325 MG PO TABS
650.0000 mg | ORAL_TABLET | ORAL | Status: DC | PRN
  Administered 2019-07-28: 650 mg via ORAL
  Filled 2019-07-28: qty 2

## 2019-07-28 MED ORDER — HEPARIN (PORCINE) IN NACL 1000-0.9 UT/500ML-% IV SOLN
INTRAVENOUS | Status: DC | PRN
  Administered 2019-07-28: 500 mL

## 2019-07-28 MED ORDER — CLOPIDOGREL BISULFATE 75 MG PO TABS: 75.0000 mg | ORAL_TABLET | Freq: Every day | ORAL | Status: DC

## 2019-07-28 MED ORDER — LABETALOL HCL 5 MG/ML IV SOLN: 10.0000 mg | INTRAVENOUS | Status: DC | PRN

## 2019-07-28 MED ORDER — ALPRAZOLAM 0.25 MG PO TABS
0.2500 mg | ORAL_TABLET | Freq: Two times a day (BID) | ORAL | Status: DC | PRN
  Administered 2019-07-29: 0.25 mg via ORAL
  Filled 2019-07-28: qty 1

## 2019-07-28 MED ORDER — MIDAZOLAM HCL 2 MG/2ML IJ SOLN
INTRAMUSCULAR | Status: AC
  Filled 2019-07-28: qty 2

## 2019-07-28 MED ORDER — IOHEXOL 350 MG/ML SOLN
INTRAVENOUS | Status: DC | PRN
  Administered 2019-07-28: 140 mL via INTRA_ARTERIAL

## 2019-07-28 MED ORDER — HEPARIN (PORCINE) IN NACL 1000-0.9 UT/500ML-% IV SOLN
INTRAVENOUS | Status: DC | PRN
  Administered 2019-07-28 (×2): 500 mL

## 2019-07-28 MED ORDER — SODIUM CHLORIDE 0.9 % IV SOLN: INTRAVENOUS | Status: AC

## 2019-07-28 MED ORDER — MORPHINE SULFATE (PF) 2 MG/ML IV SOLN
2.0000 mg | INTRAVENOUS | Status: DC | PRN
  Administered 2019-07-28 (×2): 2 mg via INTRAVENOUS
  Administered 2019-07-29 (×3): 4 mg via INTRAVENOUS
  Filled 2019-07-28: qty 2
  Filled 2019-07-28: qty 1
  Filled 2019-07-28: qty 2
  Filled 2019-07-28 (×3): qty 1

## 2019-07-28 MED ORDER — HYDRALAZINE HCL 20 MG/ML IJ SOLN: 10.0000 mg | INTRAMUSCULAR | Status: DC | PRN

## 2019-07-28 MED ORDER — ZOLPIDEM TARTRATE 5 MG PO TABS: 5.0000 mg | ORAL_TABLET | Freq: Every evening | ORAL | Status: DC | PRN

## 2019-07-28 MED ORDER — LIDOCAINE HCL (PF) 1 % IJ SOLN
INTRAMUSCULAR | Status: AC
  Filled 2019-07-28: qty 30

## 2019-07-28 MED ORDER — ONDANSETRON HCL 4 MG/2ML IJ SOLN
4.0000 mg | Freq: Four times a day (QID) | INTRAMUSCULAR | Status: DC | PRN
  Filled 2019-07-28: qty 2

## 2019-07-28 MED ORDER — HYDRALAZINE HCL 20 MG/ML IJ SOLN
INTRAMUSCULAR | Status: DC | PRN
  Administered 2019-07-28: 10 mg via INTRAVENOUS

## 2019-07-28 SURGICAL SUPPLY — 20 items
BALLN SAPPHIRE 2.5X15 (BALLOONS) ×2
BALLN SAPPHIRE ~~LOC~~ 3.0X18 (BALLOONS) ×1 IMPLANT
BALLN SAPPHIRE ~~LOC~~ 3.75X18 (BALLOONS) ×1 IMPLANT
BALLOON SAPPHIRE 2.5X15 (BALLOONS) IMPLANT
CATH DXT MULTI JL4 JR4 ANG PIG (CATHETERS) ×1 IMPLANT
CATH LAUNCHER 6FR EBU 3.75 (CATHETERS) ×1 IMPLANT
FEM STOP ARCH (HEMOSTASIS) ×1
KIT ENCORE 26 ADVANTAGE (KITS) ×1 IMPLANT
KIT HEART LEFT (KITS) ×2 IMPLANT
KIT HEMO VALVE WATCHDOG (MISCELLANEOUS) ×1 IMPLANT
PACK CARDIAC CATHETERIZATION (CUSTOM PROCEDURE TRAY) ×2 IMPLANT
SHEATH PINNACLE 5F 10CM (SHEATH) ×1 IMPLANT
SHEATH PINNACLE 6F 10CM (SHEATH) ×1 IMPLANT
STENT RESOLUTE ONYX 2.5X26 (Permanent Stent) ×1 IMPLANT
STENT RESOLUTE ONYX 3.5X30 (Permanent Stent) ×1 IMPLANT
SYSTEM COMPRESSION FEMOSTOP (HEMOSTASIS) IMPLANT
TRANSDUCER W/STOPCOCK (MISCELLANEOUS) ×2 IMPLANT
TUBING CIL FLEX 10 FLL-RA (TUBING) ×2 IMPLANT
WIRE ASAHI PROWATER 180CM (WIRE) ×1 IMPLANT
WIRE EMERALD 3MM-J .035X150CM (WIRE) ×1 IMPLANT

## 2019-07-28 NOTE — Interval H&P Note (Signed)
Cath Lab Visit (complete for each Cath Lab visit)  Clinical Evaluation Leading to the Procedure:   ACS: Yes.    Non-ACS:    Anginal Classification: CCS IV  Anti-ischemic medical therapy: Minimal Therapy (1 class of medications)  Non-Invasive Test Results: No non-invasive testing performed  Prior CABG: Previous CABG      History and Physical Interval Note:  07/28/2019 11:43 AM  Harvie Heck Prevo  has presented today for surgery, with the diagnosis of unstable angina.  The various methods of treatment have been discussed with the patient and family. After consideration of risks, benefits and other options for treatment, the patient has consented to  Procedure(s): LEFT HEART CATH AND CORS/GRAFTS ANGIOGRAPHY (N/A) as a surgical intervention.  The patient's history has been reviewed, patient examined, no change in status, stable for surgery.  I have reviewed the patient's chart and labs.  Questions were answered to the patient's satisfaction.     Larae Grooms

## 2019-07-28 NOTE — Progress Notes (Addendum)
Progress Note  Patient Name: Lucas Porter Date of Encounter: 07/28/2019  Primary Cardiologist: [name], MD   Subjective   No chest pain or shortness of breath overnight.   Inpatient Medications    Scheduled Meds: . clopidogrel  75 mg Oral Daily  . diltiazem  180 mg Oral Daily  . insulin aspart  0-15 Units Subcutaneous TID WC  . insulin aspart  0-5 Units Subcutaneous QHS  . lisinopril  20 mg Oral Daily  . metoprolol tartrate  100 mg Oral BID  . omega-3 acid ethyl esters  2 g Oral Daily  . potassium chloride  10 mEq Oral Daily  . rosuvastatin  40 mg Oral Daily  . sodium chloride flush  3 mL Intravenous Q12H  . sodium chloride flush  3 mL Intravenous Q12H  . venlafaxine XR  150 mg Oral Daily  . venlafaxine XR  75 mg Oral QHS   Continuous Infusions: . sodium chloride    . sodium chloride    . sodium chloride 1 mL/kg/hr (07/28/19 0604)  . heparin 1,200 Units/hr (07/28/19 0717)  . nitroGLYCERIN 45 mcg/min (07/28/19 0200)   PRN Meds: sodium chloride, sodium chloride, acetaminophen, ALPRAZolam, clonazePAM, doxylamine (Sleep), nitroGLYCERIN, ondansetron (ZOFRAN) IV, sodium chloride flush, sodium chloride flush, zolpidem   Vital Signs    Vitals:   07/28/19 0900 07/28/19 0930 07/28/19 1000 07/28/19 1100  BP: 131/78 (!) 175/152 128/83 125/68  Pulse: 61 71 61 69  Resp: 11 15 20 15   Temp:      TempSrc:      SpO2: 95% 96% 98% 95%  Weight:        Intake/Output Summary (Last 24 hours) at 07/28/2019 1102 Last data filed at 07/28/2019 0552 Gross per 24 hour  Intake 387.6 ml  Output 350 ml  Net 37.6 ml   Last 3 Weights 07/27/2019 06/10/2019 04/14/2019  Weight (lbs) 224 lb 13.9 oz 222 lb 12.8 oz 222 lb 14.2 oz  Weight (kg) 102 kg 101.061 kg 101.1 kg      Telemetry    Vpaced - Personally Reviewed  ECG    No new tracing this morning.  Physical Exam  Pleasant older WM, sitting up in bed.  GEN: No acute distress.   Neck: No JVD Cardiac: RRR, no murmurs,  rubs, or gallops.  Respiratory: Clear to auscultation bilaterally. GI: Soft, nontender, non-distended  MS: No edema; No deformity. Neuro:  Nonfocal  Psych: Normal affect   Labs    High Sensitivity Troponin:   Recent Labs  Lab 07/27/19 1009 07/27/19 1207 07/28/19 0621  TROPONINIHS 39* 42* 36*      Chemistry Recent Labs  Lab 07/27/19 1009 07/28/19 0624  NA 138 135  K 4.3 4.7  CL 101 102  CO2 25 20*  GLUCOSE 145* 138*  BUN 11 15  CREATININE 0.83 0.79  CALCIUM 9.2 8.9  PROT  --  6.3*  ALBUMIN  --  3.7  AST  --  27  ALT  --  23  ALKPHOS  --  65  BILITOT  --  1.3*  GFRNONAA >60 >60  GFRAA >60 >60  ANIONGAP 12 13     Hematology Recent Labs  Lab 07/27/19 1009  WBC 8.8  RBC 5.07  HGB 14.1  HCT 43.5  MCV 85.8  MCH 27.8  MCHC 32.4  RDW 13.8  PLT 218    BNPNo results for input(s): BNP, PROBNP in the last 168 hours.   DDimer No results for input(s): DDIMER  in the last 168 hours.   Radiology    Dg Chest 2 View  Result Date: 07/27/2019 CLINICAL DATA:  Chest pain. CABG. EXAM: CHEST - 2 VIEW COMPARISON:  03/25/2019 FINDINGS: The heart size and pulmonary vascularity are normal and the lungs are clear. Slight prominence of the upper left heart border is unchanged and may represent an enlarged left atrial appendage. No effusions. CABG. Pacemaker in place. No bone abnormality. IMPRESSION: No active cardiopulmonary disease. Electronically Signed   By: Lorriane Shire M.D.   On: 07/27/2019 10:28    Cardiac Studies   N/a   Patient Profile     63 y.o. male  with a history of CABG 2010, DES Lmain/CFX 2013, DES SVG-Diag & SVG-dCFX 03/2019 for USAP, persistent A. 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   Assessment & Plan    1. Unstable Angina: HsT peaked at 86. Placed on IV heparin as INR is now below 2. Planned for cardiac cath today. No further chest pain overnight.    2. Persistent Afib:  managed with Diltiazem, metoprolol and coumadin. Coumadin currently held for cath.   3. HL: on high dose statin  4. Tachy-Brady s/p PPM: followed by Dr. Sallyanne Kuster. Gen change 02/2019  5. CAD s/p CABG '10: recent cath from 03/2019 with PCI/DES to SVG-Lcx. Has been on plavix since that time. No ASA given need for coumadin.   6. DM: metformin held. SSI  For questions or updates, please contact [facility] Please consult [email or web address] for contact info under    Signed, Reino Bellis, NP  07/28/2019, 11:02 AM    Patient seen, examined. Available data reviewed. Agree with findings, assessment, and plan as outlined by Reino Bellis, NP.  The patient is evaluated in the post cath recovery area.  He complains of a very mild soreness in his chest but is doing well overall.  No shortness of breath.  He is alert, oriented, in no distress.  Lungs are clear, heart is regular rate and rhythm with no murmur or gallop, abdomen is soft and nontender, extremities have no edema.  Groin site is clear.  I reviewed the patient's cardiac catheterization films.  He underwent successful PCI of the proximal circumflex and OM branch.  The recently placed stent in the vein graft is patent without restenosis.  He will continue on his current medical program.  We will reassess him tomorrow morning.  Sherren Mocha, M.D. 07/28/2019 5:03 PM

## 2019-07-28 NOTE — ED Notes (Signed)
Tele   Breakfast ordered  

## 2019-07-28 NOTE — Plan of Care (Signed)
Lucas Porter was admitted for unstable angina and is s/p PCI of an OM3 and proximal Cx early this this afternoon via right femoral access. Sheath remains in place due to persistent ACT elevation, most recent value just reported by nursing to be around 380 from 412 at 16:20.  On further chart review, it appears he underwent left main PCI in 2013 and at that time sheath pull was significant delayed nearly 48 hours, ultimately with Angioseal closure due to persistent ACT elevation that remained over 250 at the lowest. He was seen by hematology who felt this this was likely due to a coagulopathy. He had abnormal platelet function testing from what I can see but there does not appear to be follow-up documentation regarding a diagnosis or recommendations other than for plasma transfusion or cryo if bleeding to obtain hemostasis.   For now, we will keep sheath in place with serial vascular exam and ACT.  Consider discussing with hematology in the morning.  I suspect it may be best to proceed with closure device again tomorrow if ACT remains elevated.

## 2019-07-28 NOTE — Progress Notes (Signed)
Dr. Kalman Shan paged earlier by pts RN regarding continual high ACT. Dr Kalman Shan returned call to me stating last time pt had procedure Sheath stayed in for 48 hrs. MD advised to continue checking ACT Q3hrs, sheath to stay in through the night, monitor for limb ischemia  and pt will return to Cath Lab in am for angioseal. Explained to Pt -Pt given Morphine for back pain, medication for anxiety. Will continue to monitor. Jessie Foot, RN

## 2019-07-28 NOTE — Progress Notes (Signed)
  Echocardiogram 2D Echocardiogram has been performed.  Lucas Porter G Dameion Briles 07/28/2019, 11:54 AM

## 2019-07-28 NOTE — Progress Notes (Addendum)
[place] for Heparin when INR is <2 (warfarin on hold) Indication: chest pain/ACS and atrial fibrillation  No Known Allergies  Patient Measurements: Weight: 224 lb 13.9 oz (102 kg)  Vital Signs: Temp: 97.8 F (36.6 C) (11/09 1442) Temp Source: Oral (11/09 1442) BP: 139/85 (11/10 0115) Pulse Rate: 60 (11/10 0115)  Labs: Recent Labs    07/27/19 1009 07/27/19 1207  HGB 14.1  --   HCT 43.5  --   PLT 218  --   LABPROT  --  24.8*  INR  --  2.3*  CREATININE 0.83  --   TROPONINIHS 39* 42*    Estimated Creatinine Clearance: 109 mL/min (by C-G formula based on SCr of 0.83 mg/dL).   Medical History: Past Medical History:  Diagnosis Date  . Anxiety   . Atrial fibrillation (HCC)    paroxysmal  . Atrial flutter ([place])   . Coagulopathy ([place])    persistent elevation of ACT during 10/17/11 hospitalization  . Coronary artery disease    s/p CABG 2010, s/p Successful PCI  10/17/11 of Distal Left Main and the Left Circumflex with a Promus Element DES stent -- 4.0 mm x 16 mm postdilated to 4.12 mm   . DDD (degenerative disc disease)   . Depression   . Dysrhythmia    afib  . Facial asymmetry 2010   pt. stated it was very temporary, never seen by doctor for it.   Marland Kitchen GERD (gastroesophageal reflux disease)   . Heart murmur   . Hyperlipidemia   . Hypertension   . Kidney stones   . Presence of permanent cardiac pacemaker    [employer]   . S/P coronary artery stent placement to distal Lt. main and Lt. Cx 10/17/11  . 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

## 2019-07-28 NOTE — Plan of Care (Signed)
  Problem: Activity: Goal: Ability to return to baseline activity level will improve Outcome: Progressing   Problem: Cardiovascular: Goal: Vascular access site(s) Level 0-1 will be maintained Outcome: Progressing   

## 2019-07-29 ENCOUNTER — Encounter (HOSPITAL_COMMUNITY): Payer: Self-pay | Admitting: Interventional Cardiology

## 2019-07-29 LAB — BASIC METABOLIC PANEL
Anion gap: 9 (ref 5–15)
BUN: 8 mg/dL (ref 8–23)
CO2: 23 mmol/L (ref 22–32)
Calcium: 8.8 mg/dL — ABNORMAL LOW (ref 8.9–10.3)
Chloride: 105 mmol/L (ref 98–111)
Creatinine, Ser: 0.7 mg/dL (ref 0.61–1.24)
GFR calc Af Amer: >60 mL/min (ref >60)
GFR calc non Af Amer: >60 mL/min (ref >60)
Glucose, Bld: 128 mg/dL — ABNORMAL HIGH (ref 70–99)
Potassium: 3.6 mmol/L (ref 3.5–5.1)
Sodium: 137 mmol/L (ref 135–145)

## 2019-07-29 LAB — GLUCOSE, CAPILLARY
Glucose-Capillary: 135 mg/dL — ABNORMAL HIGH (ref 70–99)
Glucose-Capillary: 118 mg/dL — ABNORMAL HIGH (ref 70–99)

## 2019-07-29 LAB — POCT ACTIVATED CLOTTING TIME
Activated Clotting Time: 395 seconds
Activated Clotting Time: 367 seconds
Activated Clotting Time: 367 seconds

## 2019-07-29 LAB — CBC
HCT: 38.7 % — ABNORMAL LOW (ref 39.0–52.0)
Hemoglobin: 13.4 g/dL (ref 13.0–17.0)
MCH: 28.5 pg (ref 26.0–34.0)
MCHC: 34.6 g/dL (ref 30.0–36.0)
MCV: 82.3 fL (ref 80.0–100.0)
Platelets: 183 10*3/uL (ref 150–400)
RBC: 4.7 MIL/uL (ref 4.22–5.81)
RDW: 13.7 % (ref 11.5–15.5)
WBC: 7.5 10*3/uL (ref 4.0–10.5)
nRBC: 0 % (ref 0.0–0.2)

## 2019-07-29 LAB — PROTIME-INR
INR: 1.2 (ref 0.8–1.2)
Prothrombin Time: 15.5 seconds — ABNORMAL HIGH (ref 11.4–15.2)

## 2019-07-29 MED ORDER — WARFARIN - PHARMACIST DOSING INPATIENT: Freq: Every day | Status: DC

## 2019-07-29 MED ORDER — HYDRALAZINE HCL 20 MG/ML IJ SOLN: 10.0000 mg | Freq: Once | INTRAMUSCULAR | Status: AC

## 2019-07-29 MED ORDER — ASPIRIN 81 MG PO CHEW: 81.0000 mg | CHEWABLE_TABLET | Freq: Every day | ORAL | 0 refills | Status: DC

## 2019-07-29 MED ORDER — WARFARIN SODIUM 2.5 MG PO TABS: 2.5000 mg | ORAL_TABLET | Freq: Once | ORAL | Status: DC

## 2019-07-29 MED ORDER — HYDRALAZINE HCL 20 MG/ML IJ SOLN
INTRAMUSCULAR | Status: AC
  Administered 2019-07-29: 20 mg
  Filled 2019-07-29: qty 1

## 2019-07-29 NOTE — Progress Notes (Signed)
Patient very anxious and wanting to go home. While discharge paperwork was completed, patient ambulating in hall and then left taking elevator down to lower level. Patients wife remained and all discharge instructions reviewed with her. Lucas Porter, spouse, manages his medications. Unable to ensure that saline lock was removed. None of the staff removed it and unable to reach patient or wife at home.

## 2019-07-29 NOTE — Progress Notes (Signed)
ACT =367;sheath still in place;toes warm to touch & with palpable pulses .

## 2019-07-29 NOTE — Discharge Summary (Signed)
Discharge Summary    Patient ID: Lucas Porter,  MRN: [id number], DOB/AGE: 1956-06-20 63 y.o.  Admit date: 07/27/2019 Discharge date: 07/29/2019  Primary Care Provider: Patient, No Pcp Per Primary Cardiologist: [name], MD  Discharge Diagnoses    Principal Problem:   Unstable angina [facility]) Active Problems:   Paroxysmal atrial fibrillation ([place])   Hypertension   Dyslipidemia   Diabetes mellitus type 2, uncomplicated (HCC)  Allergies No Known Allergies  Diagnostic Studies/Procedures    Cath: 07/28/19   Dist LM to Ost LAD lesion is 100% stenosed. LIMA to LAD is patent.  Ost 1st Diag lesion is 100% stenosed. SVG to diag is patent.  Previously placed Dist Cx drug eluting stent is widely patent.  Ost Ramus lesion is 100% stenosed. SVG to ramus is patent.  Previously placed Prox Graft drug eluting stent is widely patent.  Ost LM to Dist LM lesion is 10% stenosed.  Ost RCA to Prox RCA lesion is 100% stenosed. SVG to RCA is occluded. Left to right collaterals.  3rd Mrg lesion is 95% stenosed.  A drug-eluting stent was successfully placed using a STENT RESOLUTE ONYX 2.5X26.  Post intervention, there is a 0% residual stenosis.  Ost Cx to Prox Cx lesion is 90% stenosed.  A drug-eluting stent was successfully placed using a STENT RESOLUTE ONYX 3.5X30.  Post intervention, there is a 0% residual stenosis.  LV end diastolic pressure is mildly elevated.  There is no aortic valve stenosis.   Continue aggressive secondary prevention.  Continue DAPT for at least 1 year and clopidogrel for longer given his extensive disease.   Diagnostic Dominance: Co-dominant  Intervention    _____________   History of Present Illness     63 y.o. male with a history of CABG 2010, DES Lmain/CFX 2013, DES SVG-Diag & SVG-dCFX 03/2019 for USAP, persistent A. Fib, s/p MDT PPM for tachy/brady w/ gen change 02/2019, borderline DM, HTN, HLD, GERD, DDD, nephrolithiasis who  presented with chest pain.   Mr. Zwack had been doing well since last intervention 03/2019.  He was active around the house and not having chest pains or shortness of breath.  The day of admission he was awakened at 5 AM by substernal chest pain.  It was a tightness or pressure, reached a 9/10.  His usual angina.  This was associated with shortness of breath which was unusual.  He also had some mild diaphoresis.    He waited to see if the pain would go away, but it did not.  He took a nitroglycerin about 7 AM that morning.  The pain improved, but did not resolve.  He called his wife who came home and they called 911.  He took 2 baby aspirin at home and was given 2 more by EMS.  In the emergency room, he had morphine and Zofran.  He was still having chest pain, but it was down to a 2-3/10.  This was the first episode of pain he has had since his stents in July.  He had otherwise been doing well.  He had not had palpitations, no presyncope or syncope.  He was not aware that he was in A. fib all the time and that he is pacing regularly.  He never feels it.   Initial HsT in the ED was minimally elevated at 42, repeat 36. Given his recurrence of symptoms and known CAD he was admitted and placed on IV heparin.   Hospital Course  Given vitamin K in the ED with INR 1.7 the following day. Underwent cath note above with PCI/DES to the pLCx and 3rd OM lesion. Plan to continue on plavix. Will need triple therapy with ASA/plavix and coumadin for one month, then stop ASA. His SCT remained elevated post cath and his sheath was unable to be removed overnight. ACT was still elevated the following morning. Decision made to pull sheath with FemStop at the bedside if needed. Femoral site remained stable after sheath pull and bed rest. He was able to ambulate without complications. He will be resumed on his home dose Coumadin the evening of discharge with plans for repeat INR 11/16.   Harvie Heck Wheelwright was seen  by Dr. Burt Knack and determined stable for discharge home. Follow up in the office has been arranged. Medications are listed below.   _____________  Discharge Vitals Blood pressure (!) 157/78, pulse 68, temperature 97.8 F (36.6 C), temperature source Oral, resp. rate 10, height 5\' 10"  (1.778 m), weight 102.1 kg, SpO2 95 %.  Filed Weights   07/27/19 1700 07/28/19 1827  Weight: 102 kg 102.1 kg    Labs & Radiologic Studies    CBC Recent Labs    07/27/19 1009 07/29/19 0258  WBC 8.8 7.5  HGB 14.1 13.4  HCT 43.5 38.7*  MCV 85.8 82.3  PLT 218 [id number]   Basic Metabolic Panel Recent Labs    07/28/19 0624 07/29/19 0258  NA 135 137  K 4.7 3.6  CL 102 105  CO2 20* 23  GLUCOSE 138* 128*  BUN 15 8  CREATININE 0.79 0.70  CALCIUM 8.9 8.8*   Liver Function Tests Recent Labs    07/28/19 0624  AST 27  ALT 23  ALKPHOS 65  BILITOT 1.3*  PROT 6.3*  ALBUMIN 3.7   No results for input(s): LIPASE, AMYLASE in the last 72 hours. Cardiac Enzymes No results for input(s): CKTOTAL, CKMB, CKMBINDEX, TROPONINI in the last 72 hours. BNP Invalid input(s): POCBNP D-Dimer No results for input(s): DDIMER in the last 72 hours. Hemoglobin A1C Recent Labs    07/28/19 0138  HGBA1C 7.2*   Fasting Lipid Panel No results for input(s): CHOL, HDL, LDLCALC, TRIG, CHOLHDL, LDLDIRECT in the last 72 hours. Thyroid Function Tests No results for input(s): TSH, T4TOTAL, T3FREE, THYROIDAB in the last 72 hours.  Invalid input(s): FREET3 _____________  Dg Chest 2 View  Result Date: 07/27/2019 CLINICAL DATA:  Chest pain. CABG. EXAM: CHEST - 2 VIEW COMPARISON:  03/25/2019 FINDINGS: The heart size and pulmonary vascularity are normal and the lungs are clear. Slight prominence of the upper left heart border is unchanged and may represent an enlarged left atrial appendage. No effusions. CABG. Pacemaker in place. No bone abnormality. IMPRESSION: No active cardiopulmonary disease. Electronically Signed   By: Lorriane Shire M.D.   On: 07/27/2019 10:28   Disposition   Pt is being discharged home today in good condition.  Follow-up Plans & Appointments    Follow-up Information    [facility], [place] Follow up on 08/10/2019.   Specialties: Physician Assistant, Cardiology, Radiology Why: at 10am for your follow up appt.  Contact information: [address] [place] [address] [phone]        [facility] Follow up on 08/03/2019.   Specialty: Cardiology Why: at 3pm for your follow up appt to recheck your INR levels. Contact information: [address], [place]  Discharge Instructions    Amb Referral to Cardiac Rehabilitation   Complete by: As directed    Diagnosis: Coronary Stents   After initial evaluation and assessments completed: Virtual Based Care may be provided alone or in conjunction with Phase 2 Cardiac Rehab based on patient barriers.: Yes   Call MD for:  redness, tenderness, or signs of infection (pain, swelling, redness, odor or green/yellow discharge around incision site)   Complete by: As directed    Diet - low sodium heart healthy   Complete by: As directed    Discharge instructions   Complete by: As directed    Groin Site Care Refer to this sheet in the next few weeks. These instructions provide you with information on caring for yourself after your procedure. Your caregiver may also give you more specific instructions. Your treatment has been planned according to current medical practices, but problems sometimes occur. Call your caregiver if you have any problems or questions after your procedure. HOME CARE INSTRUCTIONS You may shower 24 hours after the procedure. Remove the bandage (dressing) and gently wash the site with plain soap and water. Gently pat the site dry.  Do not apply powder or lotion to the site.  Do not sit in a bathtub, swimming pool, or whirlpool for  5 to 7 days.  No bending, squatting, or lifting anything over 10 pounds (4.5 kg) as directed by your caregiver.  Inspect the site at least twice daily.  Do not drive home if you are discharged the same day of the procedure. Have someone else drive you.  You may drive 24 hours after the procedure unless otherwise instructed by your caregiver.  What to expect: Any bruising will usually fade within 1 to 2 weeks.  Blood that collects in the tissue (hematoma) may be painful to the touch. It should usually decrease in size and tenderness within 1 to 2 weeks.  SEEK IMMEDIATE MEDICAL CARE IF: You have unusual pain at the groin site or down the affected leg.  You have redness, warmth, swelling, or pain at the groin site.  You have drainage (other than a small amount of blood on the dressing).  You have chills.  You have a fever or persistent symptoms for more than 72 hours.  You have a fever and your symptoms suddenly get worse.  Your leg becomes pale, cool, tingly, or numb.  You have heavy bleeding from the site. Hold pressure on the site. Marland Kitchen  PLEASE DO NOT MISS ANY DOSES OF YOUR PLAVIX!!!!! Also keep a log of you blood pressures and bring back to your follow up appt. Please call the office with any questions.   Patients taking blood thinners should generally stay away from medicines like ibuprofen, Advil, Motrin, naproxen, and Aleve due to risk of stomach bleeding. You may take Tylenol as directed or talk to your primary doctor about alternatives.   Increase activity slowly   Complete by: As directed      Discharge Medications     Medication List    TAKE these medications   acetaminophen 500 MG tablet Commonly known as: TYLENOL Take 1,000 mg by mouth every 6 (six) hours as needed for moderate pain.   aspirin 81 MG chewable tablet Chew 1 tablet (81 mg total) by mouth daily. Start taking on: July 30, 2019   clonazePAM 0.5 MG tablet Commonly known as: KLONOPIN Take 1 tablet (0.5 mg  total) by mouth 2 (two) times daily as needed for anxiety.   clopidogrel  75 MG tablet Commonly known as: PLAVIX Take 1 tablet (75 mg total) by mouth daily.   Dilt-XR 180 MG 24 hr capsule Generic drug: diltiazem Take 1 capsule by mouth once daily   fish oil-omega-3 fatty acids 1000 MG capsule Take 2 g by mouth daily.   isosorbide mononitrate 30 MG 24 hr tablet Commonly known as: IMDUR Take 3 tablets (90 mg total) by mouth daily.   lisinopril 20 MG tablet Commonly known as: ZESTRIL Take 1 tablet (20 mg total) by mouth daily.   metFORMIN 500 MG tablet Commonly known as: GLUCOPHAGE Take 1 tablet (500 mg total) by mouth 2 (two) times daily with a meal.   metoprolol tartrate 100 MG tablet Commonly known as: LOPRESSOR Take 100 mg by mouth 2 (two) times daily.   nitroGLYCERIN 0.4 MG SL tablet Commonly known as: NITROSTAT Place 1 tablet (0.4 mg total) under the tongue every 5 (five) minutes as needed for up to 10 days for chest pain.   potassium chloride 10 MEQ tablet Commonly known as: KLOR-CON TAKE 1  BY MOUTH ONCE DAILY What changed: See the new instructions.   rosuvastatin 40 MG tablet Commonly known as: CRESTOR TAKE 1 TABLET BY MOUTH ONCE DAILY   SLEEP AID PO Take 1 tablet by mouth at bedtime as needed (sleep).   venlafaxine XR 75 MG 24 hr capsule Commonly known as: EFFEXOR-XR TAKE ONE CAPSULE BY MOUTH ONCE DAILY TAKE  WITH  150MG   TO  EQUAL  A  TOTAL  OF  225MG   DAILY What changed: See the new instructions.   venlafaxine XR 150 MG 24 hr capsule Commonly known as: EFFEXOR-XR TAKE ONE CAPSULE BY MOUTH ONCE DAILY WITH  THE  75  MG  TO  TOTAL  225  MG  DAILY What changed:   how much to take  how to take this  when to take this  additional instructions   warfarin 5 MG tablet Commonly known as: COUMADIN Take as directed. If you are unsure how to take this medication, talk to your nurse or doctor. Original instructions: TAKE 1/2 TO 1 (ONE-HALF TO ONE) TABLET BY  MOUTH ONCE DAILY AS DIRECTED BY COUMADIN CLINIC What changed: See the new instructions.       No                               Did the patient have a percutaneous coronary intervention (stent / angioplasty)?:  Yes.    Cath/PCI Registry Performance & Quality Measures: 1. Aspirin prescribed? - Yes 2. ADP Receptor Inhibitor (Plavix/Clopidogrel, Brilinta/Ticagrelor or Effient/Prasugrel) prescribed (includes medically managed patients)? - Yes 3. High Intensity Statin (Lipitor 40-80mg  or Crestor 20-40mg ) prescribed? - Yes 4. For EF <40%, was ACEI/ARB prescribed? - Yes 5. For EF <40%, Aldosterone Antagonist (Spironolactone or Eplerenone) prescribed? - Not Applicable (EF >/= [id number]) 6. Cardiac Rehab Phase II ordered (Included Medically managed Patients)? - Yes   Outstanding Labs/Studies   N/a   Duration of Discharge Encounter   Greater than 30 minutes including physician time.  Signed, Reino Bellis NP-C 07/29/2019, 3:45 PM]

## 2019-07-29 NOTE — Progress Notes (Addendum)
ANTICOAGULATION CONSULT NOTE - Initial Consult  Pharmacy Consult for Warfarin Indication: atrial fibrillation  No Known Allergies  Patient Measurements: Height: 5\' 10"  (177.8 cm) Weight: 225 lb (102.1 kg) IBW/kg (Calculated) : 73  Vital Signs: Temp: 97.8 F (36.6 C) (11/11 0748) Temp Source: Oral (11/11 0748) BP: 157/78 (11/11 0748) Pulse Rate: 68 (11/11 0748)  Labs: Recent Labs    07/27/19 1009 07/27/19 1207 07/28/19 0621 07/28/19 0624 07/29/19 0258  HGB 14.1  --   --   --  13.4  HCT 43.5  --   --   --  38.7*  PLT 218  --   --   --  183  LABPROT  --  24.8* 19.9*  --  15.5*  INR  --  2.3* 1.7*  --  1.2  CREATININE 0.83  --   --  0.79 0.70  TROPONINIHS 39* 42* 36*  --   --     Estimated Creatinine Clearance: 113.1 mL/min (by C-G formula based on SCr of 0.7 mg/dL).   Medical History: Past Medical History:  Diagnosis Date  . Anxiety   . Atrial fibrillation (HCC)    paroxysmal  . Atrial flutter ([place])   . Coagulopathy ([place])    persistent elevation of ACT during 10/17/11 hospitalization  . Coronary artery disease    s/p CABG 2010, s/p Successful PCI  10/17/11 of Distal Left Main and the Left Circumflex with a Promus Element DES stent -- 4.0 mm x 16 mm postdilated to 4.12 mm   . DDD (degenerative disc disease)   . Depression   . Dysrhythmia    afib  . Facial asymmetry 2010   pt. stated it was very temporary, never seen by doctor for it.   Marland Kitchen GERD (gastroesophageal reflux disease)   . Heart murmur   . Hyperlipidemia   . Hypertension   . Kidney stones   . Presence of permanent cardiac pacemaker    [employer]   . S/P coronary artery stent placement to distal Lt. main and Lt. Cx 10/17/11  . 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 aspirin  81 mg Oral Daily  . clopidogrel  75 mg Oral Daily  . diltiazem  180 mg Oral Daily  . hydrALAZINE      . hydrALAZINE  10 mg Intravenous Once  . insulin aspart   0-15 Units Subcutaneous TID WC  . insulin aspart  0-5 Units Subcutaneous QHS  . lisinopril  20 mg Oral Daily  . metoprolol tartrate  100 mg Oral BID  . omega-3 acid ethyl esters  2 g Oral Daily  . potassium chloride  10 mEq Oral Daily  . rosuvastatin  40 mg Oral Daily  . sodium chloride flush  3 mL Intravenous Q12H  . sodium chloride flush  3 mL Intravenous Q12H  . venlafaxine XR  150 mg Oral Daily  . venlafaxine XR  75 mg Oral QHS   Infusions:  . sodium chloride    . nitroGLYCERIN 13.5 mcg/min (07/28/19 2112)    Assessment: 63 y.o. male presenting with chest pain s/p PC/IDES on 07/28/19. PMH includes s/p PCI/DES 03/2019, CABG 2010, s/p PPM, borderline DM, HTN, GERD, DDD, nephrolithiasis and Afib on warfarin PTA (2.5mg  M,W,F,Sa and 5 mg T,Th,Su). Last dose 07/26/19. Admission INR 2.3; s/p vitamin K PO on 11/9. Pharmacy consulted for warfarin dosing. Of note patient is on triple therapy warfarin + aspirin + plavix per cards;  previously on this regimen for PCI/DES 03/2019.  Hgb 13.4, Plts 367 INR today 1.2 - (last dose 11/8)  Goal of Therapy:  INR 2-3 Monitor platelets by anticoagulation protocol: Yes   Plan:  Warfarin 2.5 mg x1 tonight Daily INR Monitor for s/sx of bleeding   Lorel Monaco, PharmD PGY1 Ambulatory Care Resident [employer] # [phone]

## 2019-07-29 NOTE — Progress Notes (Signed)
ACT  Result=367.sheath still in place rt.femoral area .with good pulses  palpable .toes warm to touch & with good capillary refill.

## 2019-07-29 NOTE — Progress Notes (Addendum)
Progress Note  Patient Name: Lucas Porter Date of Encounter: 07/29/2019  Primary Cardiologist: [name], MD   Subjective   No chest pain overnight. Unable to remove femoral sheath overnight as ACT remains elevated.   Inpatient Medications    Scheduled Meds: . aspirin  81 mg Oral Daily  . clopidogrel  75 mg Oral Daily  . diltiazem  180 mg Oral Daily  . insulin aspart  0-15 Units Subcutaneous TID WC  . insulin aspart  0-5 Units Subcutaneous QHS  . lisinopril  20 mg Oral Daily  . metoprolol tartrate  100 mg Oral BID  . omega-3 acid ethyl esters  2 g Oral Daily  . potassium chloride  10 mEq Oral Daily  . rosuvastatin  40 mg Oral Daily  . sodium chloride flush  3 mL Intravenous Q12H  . sodium chloride flush  3 mL Intravenous Q12H  . venlafaxine XR  150 mg Oral Daily  . venlafaxine XR  75 mg Oral QHS   Continuous Infusions: . sodium chloride    . nitroGLYCERIN 13.5 mcg/min (07/28/19 2112)   PRN Meds: sodium chloride, acetaminophen, ALPRAZolam, clonazePAM, doxylamine (Sleep), morphine injection, nitroGLYCERIN, ondansetron (ZOFRAN) IV, sodium chloride flush, zolpidem   Vital Signs    Vitals:   07/28/19 1827 07/28/19 1921 07/28/19 2025 07/29/19 0748  BP:   (!) 152/79 (!) 157/78  Pulse:  63 66 68  Resp:  10 16 10   Temp:    97.8 F (36.6 C)  TempSrc:    Oral  SpO2:  98% 97% 95%  Weight: 102.1 kg     Height: 5\' 10"  (1.778 m)       Intake/Output Summary (Last 24 hours) at 07/29/2019 0824 Last data filed at 07/29/2019 0600 Gross per 24 hour  Intake 852.08 ml  Output 1200 ml  Net -347.92 ml   Last 3 Weights 07/28/2019 07/27/2019 06/10/2019  Weight (lbs) 225 lb 224 lb 13.9 oz 222 lb 12.8 oz  Weight (kg) 102.059 kg 102 kg 101.061 kg      Telemetry    Demand vpaced - Personally Reviewed  ECG    Afib rate controlled, demand Vpacing - Personally Reviewed  Physical Exam  Pleasant older WM, laying flat in bed.  GEN: No acute distress.   Neck: No JVD  Cardiac: Irreg Irreg, no murmurs, rubs, or gallops.  Respiratory: Clear to auscultation bilaterally. GI: Soft, nontender, non-distended  MS: No edema; No deformity. Right femoral sheath in place. Leg is warm to touch and good DP pulse.  Neuro:  Nonfocal  Psych: Normal affect   Labs    High Sensitivity Troponin:   Recent Labs  Lab 07/27/19 1009 07/27/19 1207 07/28/19 0621  TROPONINIHS 39* 42* 36*      Chemistry Recent Labs  Lab 07/27/19 1009 07/28/19 0624 07/29/19 0258  NA 138 135 137  K 4.3 4.7 3.6  CL 101 102 105  CO2 25 20* 23  GLUCOSE 145* 138* 128*  BUN 11 15 8   CREATININE 0.83 0.79 0.70  CALCIUM 9.2 8.9 8.8*  PROT  --  6.3*  --   ALBUMIN  --  3.7  --   AST  --  27  --   ALT  --  23  --   ALKPHOS  --  65  --   BILITOT  --  1.3*  --   GFRNONAA >60 >60 >60  GFRAA >60 >60 >60  ANIONGAP 12 13 9      Hematology Recent Labs  Lab 07/27/19 1009 07/29/19 0258  WBC 8.8 7.5  RBC 5.07 4.70  HGB 14.1 13.4  HCT 43.5 38.7*  MCV 85.8 82.3  MCH 27.8 28.5  MCHC 32.4 34.6  RDW 13.8 13.7  PLT 218 183    BNPNo results for input(s): BNP, PROBNP in the last 168 hours.   DDimer No results for input(s): DDIMER in the last 168 hours.   Radiology    Dg Chest 2 View  Result Date: 07/27/2019 CLINICAL DATA:  Chest pain. CABG. EXAM: CHEST - 2 VIEW COMPARISON:  03/25/2019 FINDINGS: The heart size and pulmonary vascularity are normal and the lungs are clear. Slight prominence of the upper left heart border is unchanged and may represent an enlarged left atrial appendage. No effusions. CABG. Pacemaker in place. No bone abnormality. IMPRESSION: No active cardiopulmonary disease. Electronically Signed   By: Lorriane Shire M.D.   On: 07/27/2019 10:28    Cardiac Studies   Cath: 07/28/19   Dist LM to Ost LAD lesion is 100% stenosed. LIMA to LAD is patent.  Ost 1st Diag lesion is 100% stenosed. SVG to diag is patent.  Previously placed Dist Cx drug eluting stent is widely  patent.  Ost Ramus lesion is 100% stenosed. SVG to ramus is patent.  Previously placed Prox Graft drug eluting stent is widely patent.  Ost LM to Dist LM lesion is 10% stenosed.  Ost RCA to Prox RCA lesion is 100% stenosed. SVG to RCA is occluded. Left to right collaterals.  3rd Mrg lesion is 95% stenosed.  A drug-eluting stent was successfully placed using a STENT RESOLUTE ONYX 2.5X26.  Post intervention, there is a 0% residual stenosis.  Ost Cx to Prox Cx lesion is 90% stenosed.  A drug-eluting stent was successfully placed using a STENT RESOLUTE ONYX 3.5X30.  Post intervention, there is a 0% residual stenosis.  LV end diastolic pressure is mildly elevated.  There is no aortic valve stenosis.   Continue aggressive secondary prevention.  Continue DAPT for at least 1 year and clopidogrel for longer given his extensive disease.  Spoke to wife who states that he has not missed any clopidogrel doses.  Diagnostic Dominance: Co-dominant  Intervention     Patient Profile     63 y.o. male with a history of CABG 2010, DESLmain/CFX 2013, DES SVG-Diag & SVG-dCFX 03/2019 for USAP,persistent A. Fib,s/p MDT PPM for tachy/brady w/ gen change 02/2019, borderline DM, HTN, HLD, GERD, DDD,nephrolithiasis.   Assessment & Plan    1. Unstable Angina: underwent cardiac cath noted above with PCI/DES to the pLCx and 3rd OM lesion. Plan to continue on plavix. Unclear whether plan will be for triple therapy at the time of discharge. Will review with MD. Currently coumadin is held, and unable to resume as femoral sheath remains in place. ACT was 365 earlier this morning. Repeat pending. Will review plan with MD regarding sheath removal today.   2. Persistent Afib: managed with Diltiazem, metoprolol and coumadin. Coumadin remains on hold.   3. HL: on high dose statin  4. Tachy-Brady s/p PPM: followed by Dr. Sallyanne Kuster. Gen change 02/2019  5. CAD s/p CABG '10: recent cath from 03/2019 with  PCI/DES to SVG-Lcx. Has been on plavix since that time. No ASA given need for coumadin.   6. DM: metformin held. SSI  For questions or updates, please contact [facility] Please consult [email or web address] for contact info under     Signed, Reino Bellis, NP  07/29/2019, 8:24 AM    Patient  seen, examined. Available data reviewed. Agree with findings, assessment, and plan as outlined by Reino Bellis, NP.  The patient is independently interviewed and examined.  He looks good today and he is chest pain-free.  Lungs are clear, heart is regular rate and rhythm, abdomen is soft and nontender, extremities have no edema, the right groin site is clear with no hematoma or ecchymosis.  The patient's sheath was just pulled this morning because of a continued elevation of the ACT.  The patient will ambulate in 4 hours.  We will restart warfarin today.  I would recommend keeping him on triple therapy with warfarin, aspirin 81 mg, and clopidogrel 75 mg for 1 month.  He will then discontinue aspirin and remain on warfarin and clopidogrel.  Consider discharge late this afternoon.  Sherren Mocha, M.D. 07/29/2019 11:05 AM

## 2019-07-29 NOTE — Progress Notes (Addendum)
Site area: right groin  Site Prior to Removal:  Level 0  Pressure Applied For 45 MINUTES    Minutes Beginning at 925  Manual:   Yes.    Patient Status During Pull:  stable  Post Pull Groin Site:  Level 0  Post Pull Instructions Given:  Yes.    Post Pull Pulses Present:  Yes.    Dressing Applied:  Yes.    Comments:  Checked site frequently and patient remained stable, site level 0 with no changes and right  DP +2.  After 4 hours bedrest, 1415 cardiac rehab completed education and instructions.  1525 Patient ambulated in hall with a steady gait independently. Accompanied by nurse and wife. No chest pain, shortness of breath, lightheadedness or dizziness. Tolerated well. Right femoral site, level 0, no bleeding, bruising or hematoma noted no change after ambulation. [place] NP into see patient and discuss discharge with patient and wife. Discharge orders written.

## 2019-07-29 NOTE — Progress Notes (Signed)
CARDIAC REHAB PHASE I   Stent education completed with pt and wife. Pt educated on importance of ASA and Plavix. Pt given stent card, healthy healthy and diabetic diets. Reviewed restrictions, site care, and exercise guidelines. Will refer to CRP II [place]. Pt is interested in participating in Virtual Cardiac and Pulmonary Rehab. Pt advised that Virtual Cardiac and Pulmonary Rehab is provided at no cost to the patient.  Checklist:  1. Pt has smart device  ie smartphone and/or ipad for downloading an app  Yes 2. Reliable internet/wifi service    Yes 3. Understands how to use their smartphone and navigate within an app.  Yes   Pt verbalized understanding and is in agreement.  [id number] Rufina Falco, RN BSN 07/29/2019 2:53 PM

## 2019-07-29 NOTE — Progress Notes (Signed)
ACT result =362;sheath still in place ;no bleeding or hematoma noted.Pulses palpable & toes warm to touch & with good capillary refill .Will continue to monitor pt.

## 2019-07-30 ENCOUNTER — Telehealth (HOSPITAL_COMMUNITY): Payer: Self-pay

## 2019-07-30 NOTE — Telephone Encounter (Signed)
Pt insurance is active and benefits verified through [facility]. Co-pay $0.00, DED $5,500.00/$0.00 met, out of pocket $13,700.00/$7,133.98 met, co-insurance 25%. No pre-authorization required. Passport, 07/30/2019 @ 10:48AM, EQD#[id number]  2ndary insurance is active and benefits verified through [facility]. Co-pay $10.00, DED $0.00/$0.00 met, out of pocket $3,400.00/$0.00 met, co-insurance 0%. No pre-authorization required. Passport, 07/30/2019 @ 10:55AM, QGQ#[id number]  Will pass to RN Navigator for review.

## 2019-07-30 NOTE — Telephone Encounter (Signed)
Attempted to contact pt in regards to CR, LMTCB. °

## 2019-08-03 ENCOUNTER — Ambulatory Visit (INDEPENDENT_AMBULATORY_CARE_PROVIDER_SITE_OTHER): Payer: BC Managed Care – PPO | Admitting: Pharmacist

## 2019-08-03 ENCOUNTER — Other Ambulatory Visit: Payer: Self-pay

## 2019-08-03 DIAGNOSIS — I480 Paroxysmal atrial fibrillation: Secondary | ICD-10-CM | POA: Diagnosis not present

## 2019-08-03 DIAGNOSIS — Z7901 Long term (current) use of anticoagulants: Secondary | ICD-10-CM

## 2019-08-03 LAB — POCT INR: INR: 1.5 — AB (ref 2.0–3.0)

## 2019-08-06 ENCOUNTER — Telehealth: Payer: Self-pay | Admitting: Physician Assistant

## 2019-08-06 NOTE — Telephone Encounter (Signed)
New message:      Patient wife states she needs to come with the patient he has trouble hearing.

## 2019-08-09 NOTE — Progress Notes (Signed)
Cardiology Office Note:    Date:  08/10/2019   ID:  OSIEL DEHAY, DOB 03/27/1956, MRN [id number]  PCP:  Patient, No Pcp Per  Cardiologist:  [name], MD   Referring MD: No ref. provider found   Chief Complaint  Patient presents with  . Hospitalization Follow-up    s/p DES x 2    History of Present Illness:    Lucas Porter is a 63 y.o. male with a hx of persistent atrial fibrillation with hx of redo ablation and failed dofetilide, on coumadin, HTN, SSS s/p PPM with gn change 2020, DM, HLD, and GERD. He has an extensive history of CAD s/p CABG x 4 (2010) with DES to left main and Cx 2013, DES to SVG-D and DES to distal LCx 03/2019. Heart cath on 07/28/19 for unstable angina revealed patent LIMA-LAD, SVG-D1 patent, SVG-ramus patent, SVR-RCA occluded with left-right collaterals, DES placed to 3rd marginal which was 95% stenosed. DES placed to ostial-proximal Cx which was 90% stenosed.   He presents today following the above intervention. He is recovering well, but had a twinge of chest pain this morning. Chest pressure lasted only seconds with resolution without nitro. He also reports DOE when walking to his barn  - this doesn't happen every time. He does not think he is having anginal symptoms similar to his recent stents. He would like to wait to see if he continues to have chest pain - we discussed increasing imdur to 120 mg.    Past Medical History:  Diagnosis Date  . Anxiety   . Atrial fibrillation (HCC)    paroxysmal  . Atrial flutter ([place])   . Coagulopathy ([place])    persistent elevation of ACT during 10/17/11 hospitalization  . Coronary artery disease    s/p CABG 2010, s/p Successful PCI  10/17/11 of Distal Left Main and the Left Circumflex with a Promus Element DES stent -- 4.0 mm x 16 mm postdilated to 4.12 mm   . DDD (degenerative disc disease)   . Depression   . Dysrhythmia    afib  . Facial asymmetry 2010   pt. stated it was very temporary, never seen by doctor  for it.   Marland Kitchen GERD (gastroesophageal reflux disease)   . Heart murmur   . Hyperlipidemia   . Hypertension   . Kidney stones   . Presence of permanent cardiac pacemaker    Medtronic   . S/P coronary artery stent placement to distal Lt. main and Lt. Cx 10/17/11  . 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 ATRIAL FIBRILLATION ABLATION  04/24/12, 12/02/12   PVI x2 and CTI ablation by Dr Rayann Heman  . ATRIAL FIBRILLATION ABLATION N/A 04/24/2012   Procedure: ATRIAL FIBRILLATION ABLATION;  Surgeon: Thompson Grayer, MD;  Location: [facility] CATH LAB;  Service: Cardiovascular;  Laterality: N/A;  . ATRIAL FIBRILLATION ABLATION N/A 12/02/2012   Procedure: ATRIAL FIBRILLATION ABLATION;  Surgeon: Thompson Grayer, MD;  Location: [facility] CATH LAB;  Service: Cardiovascular;  Laterality: N/A;  . CORONARY ANGIOPLASTY  10/19/11  . CORONARY ARTERY BYPASS GRAFT  2010  . CORONARY STENT INTERVENTION N/A 04/13/2019   Procedure: CORONARY STENT INTERVENTION;  Surgeon: Nelva Bush, MD;  Location: [place] CV LAB;  Service: Cardiovascular;  Laterality: N/A;  . CORONARY STENT INTERVENTION N/A 07/28/2019   Procedure: CORONARY STENT INTERVENTION;  Surgeon: Jettie Booze, MD;  Location: [place] CV LAB;  Service: Cardiovascular;  Laterality: N/A;  . CYSTOSCOPY  2007 ?   treatment for Kidney stones   . INGUINAL HERNIA REPAIR Left 01/04/2016   Procedure: LAPAROSCOPIC LEFT INGUINAL HERNIA WITH MESH;  Surgeon: Ralene Ok, MD;  Location: [place];  Service: General;  Laterality: Left;  . INSERT / REPLACE / REMOVE PACEMAKER  2/12   Medtronic  . INSERTION OF MESH Left 01/04/2016   Procedure: INSERTION OF MESH;  Surgeon: Ralene Ok, MD;  Location: [place];  Service: General;  Laterality: Left;  . LAPAROSCOPIC LYSIS OF ADHESIONS Left 01/04/2016   Procedure: LAPAROSCOPIC LYSIS OF ADHESIONS;  Surgeon: Ralene Ok, MD;  Location: [place];  Service:  General;  Laterality: Left;  . LEFT HEART CATH AND CORS/GRAFTS ANGIOGRAPHY N/A 02/20/2019   Procedure: LEFT HEART CATH AND CORS/GRAFTS ANGIOGRAPHY;  Surgeon: Belva Crome, MD;  Location: [place] CV LAB;  Service: Cardiovascular;  Laterality: N/A;  . LEFT HEART CATH AND CORS/GRAFTS ANGIOGRAPHY N/A 04/13/2019   Procedure: LEFT HEART CATH AND CORS/GRAFTS ANGIOGRAPHY;  Surgeon: Nelva Bush, MD;  Location: [place] CV LAB;  Service: Cardiovascular;  Laterality: N/A;  . LEFT HEART CATH AND CORS/GRAFTS ANGIOGRAPHY N/A 07/28/2019   Procedure: LEFT HEART CATH AND CORS/GRAFTS ANGIOGRAPHY;  Surgeon: Jettie Booze, MD;  Location: [place] CV LAB;  Service: Cardiovascular;  Laterality: N/A;  . LEFT HEART CATHETERIZATION WITH CORONARY/GRAFT ANGIOGRAM N/A 10/17/2011   Procedure: LEFT HEART CATHETERIZATION WITH Beatrix Fetters;  Surgeon: [name], MD;  Location: [place] CATH LAB;  Service: Cardiovascular;  Laterality: N/A;  . LEFT HEART CATHETERIZATION WITH CORONARY/GRAFT ANGIOGRAM N/A 02/08/2012   Procedure: LEFT HEART CATHETERIZATION WITH Beatrix Fetters;  Surgeon: [name], MD;  Location: [place] CATH LAB;  Service: Cardiovascular;  Laterality: N/A;  . NM MYOCAR PERF WALL MOTION  03/23/2011   Low risk  . PERCUTANEOUS CORONARY STENT INTERVENTION (PCI-S)  10/17/2011   Procedure: PERCUTANEOUS CORONARY STENT INTERVENTION (PCI-S);  Surgeon: [name], MD;  Location: [facility] CATH LAB;  Service: Cardiovascular;;  . PPM GENERATOR CHANGEOUT N/A 02/20/2019   Procedure: PPM GENERATOR CHANGEOUT;  Surgeon: [name], MD;  Location: [place] CV LAB;  Service: Cardiovascular;  Laterality: N/A;  . TEE WITHOUT CARDIOVERSION  04/23/2012   Procedure: TRANSESOPHAGEAL ECHOCARDIOGRAM (TEE);  Surgeon: Josue Hector, MD;  Location: [facility] ENDOSCOPY;  Service: Cardiovascular;  Laterality: N/A;  spoke to pt about time change from 11a to 12p/DL  . TEE WITHOUT CARDIOVERSION N/A 12/01/2012    Procedure: TRANSESOPHAGEAL ECHOCARDIOGRAM (TEE);  Surgeon: [name], MD;  Location: [facility] ENDOSCOPY;  Service: Cardiovascular;  Laterality: N/A;    Current Medications: Current Meds  Medication Sig  . acetaminophen (TYLENOL) 500 MG tablet Take 1,000 mg by mouth every 6 (six) hours as needed for moderate pain.  Marland Kitchen aspirin 81 MG chewable tablet Chew 1 tablet (81 mg total) by mouth daily.  . clonazePAM (KLONOPIN) 0.5 MG tablet Take 1 tablet (0.5 mg total) by mouth 2 (two) times daily as needed for anxiety.  . clopidogrel (PLAVIX) 75 MG tablet Take 1 tablet (75 mg total) by mouth daily.  Marland Kitchen DILT-XR 180 MG 24 hr capsule Take 1 capsule by mouth once daily  . Doxylamine Succinate, Sleep, (SLEEP AID PO) Take 1 tablet by mouth at bedtime as needed (sleep).  . fish oil-omega-3 fatty acids 1000 MG capsule Take 2 g by mouth daily.   . isosorbide mononitrate (IMDUR) 30 MG 24 hr tablet Take 3 tablets (90 mg total) by mouth daily.  Marland Kitchen lisinopril (  ZESTRIL) 20 MG tablet Take 1 tablet (20 mg total) by mouth daily.  . metFORMIN (GLUCOPHAGE) 500 MG tablet Take 1 tablet (500 mg total) by mouth 2 (two) times daily with a meal.  . metoprolol tartrate (LOPRESSOR) 100 MG tablet Take 100 mg by mouth 2 (two) times daily.  . potassium chloride (K-DUR) 10 MEQ tablet TAKE 1  BY MOUTH ONCE DAILY (Patient taking differently: Take 10 mEq by mouth daily. )  . rosuvastatin (CRESTOR) 40 MG tablet TAKE 1 TABLET BY MOUTH ONCE DAILY (Patient taking differently: Take 40 mg by mouth daily. )  . venlafaxine XR (EFFEXOR-XR) 150 MG 24 hr capsule TAKE ONE CAPSULE BY MOUTH ONCE DAILY WITH  THE  75  MG  TO  TOTAL  225  MG  DAILY (Patient taking differently: Take 150 mg by mouth daily. )  . venlafaxine XR (EFFEXOR-XR) 75 MG 24 hr capsule TAKE ONE CAPSULE BY MOUTH ONCE DAILY TAKE  WITH  150MG   TO  EQUAL  A  TOTAL  OF  225MG   DAILY (Patient taking differently: Take 75 mg by mouth at bedtime. )  . warfarin (COUMADIN) 5 MG tablet TAKE 1/2 TO 1  (ONE-HALF TO ONE) TABLET BY MOUTH ONCE DAILY AS DIRECTED BY COUMADIN CLINIC (Patient taking differently: Take 2.5-5 mg by mouth See admin instructions. Take 2.5 mg at night on Mon, Wed, Fri, and Sat.  Take 5 mg at night on Tues, Thur, and Sun.)     Allergies:   Patient has no known allergies.   Social History   Socioeconomic History  . Marital status: Married    Spouse name: Morey Hummingbird  . Number of children: 2  . Years of education: 10  . Highest education level: 10th grade  Occupational History  . Occupation: disability-heart disease    Comment: Investment banker, corporate  Social Needs  . Financial resource strain: Not on file  . Food insecurity    Worry: Not on file    Inability: Not on file  . Transportation needs    Medical: Not on file    Non-medical: Not on file  Tobacco Use  . Smoking status: Former Smoker    Years: 1.00    Types: Cigarettes    Quit date: 12/18/1963    Years since quitting: 55.6  . Smokeless tobacco: Former Systems developer    Quit date: 10/03/2012  . Tobacco comment: quit smoking in 1970's  Substance and Sexual Activity  . Alcohol use: Yes    Alcohol/week: 1.0 standard drinks    Types: 1 Cans of beer per week    Comment: rare  . Drug use: Yes    Types: Marijuana    Comment: sometimes (down from every day)  . Sexual activity: Yes  Lifestyle  . Physical activity    Days per week: Not on file    Minutes per session: Not on file  . Stress: Not on file  Relationships  . Social Herbalist on phone: Not on file    Gets together: Not on file    Attends religious service: Not on file    Active member of club or organization: Not on file    Attends meetings of clubs or organizations: Not on file    Relationship status: Not on file  Other Topics Concern  . Not on file  Social History Narrative   Pt lives in [place] with spouse.     2 grown daughters.     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   Caffeine intake daily      Family  History: The patient's family history includes Alzheimer's disease in his father; Colon cancer in his mother; Coronary artery disease in an other family member; Diabetes in his brother; Heart attack in his brother; Heart disease in his brother and father; Hypertension in his brother, brother, and sister; Stroke in his brother. There is no history of Stomach cancer or Pancreatic cancer.  ROS:   Please see the history of present illness.     All other systems reviewed and are negative.  EKGs/Labs/Other Studies Reviewed:    The following studies were reviewed today:  Left heart cath 07/28/19:   Dist LM to Ost LAD lesion is 100% stenosed. LIMA to LAD is patent.  Ost 1st Diag lesion is 100% stenosed. SVG to diag is patent.  Previously placed Dist Cx drug eluting stent is widely patent.  Ost Ramus lesion is 100% stenosed. SVG to ramus is patent.  Previously placed Prox Graft drug eluting stent is widely patent.  Ost LM to Dist LM lesion is 10% stenosed.  Ost RCA to Prox RCA lesion is 100% stenosed. SVG to RCA is occluded. Left to right collaterals.  3rd Mrg lesion is 95% stenosed.  A drug-eluting stent was successfully placed using a STENT RESOLUTE ONYX 2.5X26.  Post intervention, there is a 0% residual stenosis.  Ost Cx to Prox Cx lesion is 90% stenosed.  A drug-eluting stent was successfully placed using a STENT RESOLUTE ONYX 3.5X30.  Post intervention, there is a 0% residual stenosis.  LV end diastolic pressure is mildly elevated.  There is no aortic valve stenosis.   Continue aggressive secondary prevention.  Continue DAPT for at least 1 year and clopidogrel for longer given his extensive disease.  Diagnostic Dominance: Co-dominant  Intervention     Echo 07/28/19:  1. Left ventricular ejection fraction, by visual estimation, is 45%. The left ventricle has mildly decreased function. There is no left ventricular hypertrophy. Inferolateral and anterolateral walls  appear hypokinetic.  2. Left ventricular diastolic parameters are indeterminate.  3. Global right ventricle has mildly reduced systolic function.The right ventricular size is normal. No increase in right ventricular wall thickness.  4. A pacer wire is visualized in the RV.  5. Left atrial size was moderately dilated.  6. Right atrial size was mildly dilated.  7. Mild mitral annular calcification.  8. The mitral valve is normal in structure. Mild mitral valve regurgitation. No evidence of mitral stenosis.  9. The tricuspid valve is normal in structure. Tricuspid valve regurgitation is trivial. 10. The aortic valve is tricuspid. Aortic valve regurgitation is trivial. Mild aortic valve sclerosis without stenosis. 11. The inferior vena cava is normal in size with greater than 50% respiratory variability, suggesting right atrial pressure of 3 mmHg. 12. TR signal is inadequate for assessing pulmonary artery systolic pressure.    EKG:  EKG is ordered today.  The ekg ordered today demonstrates v-paced  Recent Labs: 07/28/2019: ALT 23 07/29/2019: BUN 8; Creatinine, Ser 0.70; Hemoglobin 13.4; Platelets 183; Potassium 3.6; Sodium 137  Recent Lipid Panel    Component Value Date/Time   CHOL 117 04/09/2019 1532   TRIG 253 (H) 04/09/2019 1532   HDL 40 04/09/2019 1532   CHOLHDL 2.9 04/09/2019 1532   CHOLHDL 5.9 (H) 09/24/2016 1010   VLDL 48 (H) 09/24/2016 1010   LDLCALC 26 04/09/2019 1532    Physical Exam:    VS:  BP 131/81  Pulse 60   Temp (!) 97 F (36.1 C)   Ht 5\' 10"  (1.778 m)   Wt 222 lb 9.6 oz (101 kg)   SpO2 97%   BMI 31.94 kg/m     Wt Readings from Last 3 Encounters:  08/10/19 222 lb 9.6 oz (101 kg)  07/28/19 225 lb (102.1 kg)  06/10/19 222 lb 12.8 oz (101.1 kg)     GEN: Well nourished, well developed in no acute distress HEENT: Normal NECK: No JVD; No carotid bruits LYMPHATICS: No lymphadenopathy CARDIAC: RRR, no murmurs, rubs, gallops RESPIRATORY:  Clear to  auscultation without rales, wheezing or rhonchi  ABDOMEN: Soft, non-tender, non-distended MUSCULOSKELETAL:  No edema; No deformity  SKIN: Warm and dry NEUROLOGIC:  Alert and oriented x 3 PSYCHIATRIC:  Normal affect   ASSESSMENT:    1. Coronary artery disease involving native heart with angina pectoris, unspecified vessel or lesion type ([place])   2. S/P CABG (coronary artery bypass graft)   3. Essential hypertension   4. Atrial flutter, unspecified type ([place])   5. Tachycardia-bradycardia syndrome ([place])   6. Mixed hyperlipidemia   7. Paroxysmal atrial fibrillation (HCC)   8. Type 2 diabetes mellitus without complication, without long-term current use of insulin (HCC)    PLAN:    In order of problems listed above:  CAD s/p CABG DES to 3rd marginal and ostial LCx 07/2019 - ASA and plavix - cath report states DAPT for 12 months - this was reviewed with Dr. Sallyanne Kuster - he will stop ASA after 30 days and continue plavix and coumadin - continue BB, imdur, lisinopril, and dilt-xr - he has had twinges of chest pain and intermittent DOE - if this continues he will increase his imdur to 120 mg and call our office    Hypertension - medications as above - pressure controlled   Hyperlipidemia with LDL goal < 70 04/09/2019: Cholesterol, Total 117; HDL 40; LDL Calculated 26; Triglycerides 253 - continue crestor 40 mg   Persistent atrial fibrillation - continue coumadin   Tachybrady syndrome s/p PPM - gen change 2020 - last device check normal, not PPM dependent   DM - last A1c 7.2%  Follow up in 2 months.   Medication Adjustments/Labs and Tests Ordered: Current medicines are reviewed at length with the patient today.  Concerns regarding medicines are outlined above.  Orders Placed This Encounter  Procedures  . EKG 12-Lead   No orders of the defined types were placed in this encounter.   Signed, Ledora Bottcher, [place]  08/10/2019 12:33 PM    [facility]  HeartCare

## 2019-08-10 ENCOUNTER — Ambulatory Visit (INDEPENDENT_AMBULATORY_CARE_PROVIDER_SITE_OTHER): Payer: BC Managed Care – PPO | Admitting: Pharmacist

## 2019-08-10 ENCOUNTER — Other Ambulatory Visit: Payer: Self-pay

## 2019-08-10 ENCOUNTER — Encounter: Payer: Self-pay | Admitting: Physician Assistant

## 2019-08-10 ENCOUNTER — Ambulatory Visit (INDEPENDENT_AMBULATORY_CARE_PROVIDER_SITE_OTHER): Payer: BC Managed Care – PPO | Admitting: Physician Assistant

## 2019-08-10 VITALS — BP 131/81 | HR 60 | Temp 97.0°F | Ht 70.0 in | Wt 222.6 lb

## 2019-08-10 DIAGNOSIS — Z7901 Long term (current) use of anticoagulants: Secondary | ICD-10-CM | POA: Diagnosis not present

## 2019-08-10 DIAGNOSIS — Z951 Presence of aortocoronary bypass graft: Secondary | ICD-10-CM

## 2019-08-10 DIAGNOSIS — I480 Paroxysmal atrial fibrillation: Secondary | ICD-10-CM

## 2019-08-10 DIAGNOSIS — I25119 Atherosclerotic heart disease of native coronary artery with unspecified angina pectoris: Secondary | ICD-10-CM

## 2019-08-10 DIAGNOSIS — I495 Sick sinus syndrome: Secondary | ICD-10-CM

## 2019-08-10 DIAGNOSIS — E119 Type 2 diabetes mellitus without complications: Secondary | ICD-10-CM

## 2019-08-10 DIAGNOSIS — I10 Essential (primary) hypertension: Secondary | ICD-10-CM | POA: Diagnosis not present

## 2019-08-10 DIAGNOSIS — E782 Mixed hyperlipidemia: Secondary | ICD-10-CM

## 2019-08-10 DIAGNOSIS — I4892 Unspecified atrial flutter: Secondary | ICD-10-CM | POA: Diagnosis not present

## 2019-08-10 LAB — POCT INR: INR: 2.3 (ref 2.0–3.0)

## 2019-08-10 NOTE — Patient Instructions (Signed)
Medication Instructions:  Continue current medications  *If you need a refill on your cardiac medications before your next appointment, please call your pharmacy*  Lab Work: None Ordered  Testing/Procedures: None ordered  Follow-Up: At [facility], you and your health needs are our priority.  As part of our continuing mission to provide you with exceptional heart care, we have created designated Provider Care Teams.  These Care Teams include your primary Cardiologist (physician) and Advanced Practice Providers (APPs -  Physician Assistants and Nurse Practitioners) who all work together to provide you with the care you need, when you need it.  Your next appointment:   6-8 week(s)   The format for your next appointment:   In Person  Provider:   [name], MD  Other Instructions Increase Isosorbide 4 tablets(120 mg) if chest pain occurs again

## 2019-08-11 ENCOUNTER — Telehealth (HOSPITAL_COMMUNITY): Payer: Self-pay

## 2019-08-11 ENCOUNTER — Encounter (HOSPITAL_COMMUNITY): Payer: Self-pay

## 2019-08-11 NOTE — Telephone Encounter (Signed)
Attempted to call patient in regards to Cardiac Rehab - LM on VM Mailed letter 

## 2019-08-25 ENCOUNTER — Ambulatory Visit (INDEPENDENT_AMBULATORY_CARE_PROVIDER_SITE_OTHER): Payer: BC Managed Care – PPO | Admitting: *Deleted

## 2019-08-25 ENCOUNTER — Ambulatory Visit (INDEPENDENT_AMBULATORY_CARE_PROVIDER_SITE_OTHER): Payer: BC Managed Care – PPO | Admitting: Pharmacist

## 2019-08-25 ENCOUNTER — Other Ambulatory Visit: Payer: Self-pay

## 2019-08-25 DIAGNOSIS — I480 Paroxysmal atrial fibrillation: Secondary | ICD-10-CM

## 2019-08-25 DIAGNOSIS — I495 Sick sinus syndrome: Secondary | ICD-10-CM

## 2019-08-25 DIAGNOSIS — Z7901 Long term (current) use of anticoagulants: Secondary | ICD-10-CM

## 2019-08-25 LAB — CUP PACEART REMOTE DEVICE CHECK
Date Time Interrogation Session: 20201208072051
Implantable Lead Implant Date: 20120229
Implantable Lead Implant Date: 20120229
Implantable Lead Location: 753859
Implantable Lead Location: 753860
Implantable Pulse Generator Implant Date: 20200605

## 2019-08-25 LAB — POCT ACTIVATED CLOTTING TIME
Activated Clotting Time: 362 seconds
Activated Clotting Time: 362 seconds

## 2019-08-25 LAB — POCT INR: INR: 2.9 (ref 2.0–3.0)

## 2019-08-26 ENCOUNTER — Telehealth (HOSPITAL_COMMUNITY): Payer: Self-pay

## 2019-08-26 NOTE — Telephone Encounter (Signed)
No response from pt regarding CR.  Closed referral.  

## 2019-08-28 ENCOUNTER — Telehealth (HOSPITAL_COMMUNITY): Payer: Self-pay | Admitting: Pharmacist

## 2019-09-24 ENCOUNTER — Other Ambulatory Visit: Payer: Self-pay

## 2019-09-24 ENCOUNTER — Ambulatory Visit (INDEPENDENT_AMBULATORY_CARE_PROVIDER_SITE_OTHER): Payer: BC Managed Care – PPO | Admitting: Pharmacist

## 2019-09-24 DIAGNOSIS — I480 Paroxysmal atrial fibrillation: Secondary | ICD-10-CM

## 2019-09-24 DIAGNOSIS — Z7901 Long term (current) use of anticoagulants: Secondary | ICD-10-CM | POA: Diagnosis not present

## 2019-09-24 LAB — POCT INR: INR: 2.3 (ref 2.0–3.0)

## 2019-09-24 NOTE — Telephone Encounter (Signed)
Patient underwent cardiac catheterization with placement of drug-eluting stent on 11/10 and was discharged on triple therapy with aspirin, clopidogrel (Plavix), and warfarin (Coumadin). The plan was to de-escalate therapy after one month.  Attempted to call the patient multiple times to remind him to stop taking his aspirin but patient did not respond. Will stop call attempts at this time.  Richardine Service, PharmD PGY1 Pharmacy Resident Phone: [phone] 09/24/2019  6:11 PM

## 2019-09-25 NOTE — Progress Notes (Signed)
PPM Remote  

## 2019-09-27 ENCOUNTER — Other Ambulatory Visit: Payer: Self-pay | Admitting: Cardiovascular Disease

## 2019-10-16 ENCOUNTER — Telehealth (INDEPENDENT_AMBULATORY_CARE_PROVIDER_SITE_OTHER): Payer: BC Managed Care – PPO | Admitting: Cardiovascular Disease

## 2019-10-16 ENCOUNTER — Encounter: Payer: Self-pay | Admitting: Cardiovascular Disease

## 2019-10-16 ENCOUNTER — Telehealth: Payer: Self-pay | Admitting: *Deleted

## 2019-10-16 VITALS — Ht 70.0 in | Wt 220.0 lb

## 2019-10-16 DIAGNOSIS — R55 Syncope and collapse: Secondary | ICD-10-CM

## 2019-10-16 DIAGNOSIS — Z950 Presence of cardiac pacemaker: Secondary | ICD-10-CM | POA: Diagnosis not present

## 2019-10-16 DIAGNOSIS — I25110 Atherosclerotic heart disease of native coronary artery with unstable angina pectoris: Secondary | ICD-10-CM | POA: Diagnosis not present

## 2019-10-16 DIAGNOSIS — E669 Obesity, unspecified: Secondary | ICD-10-CM

## 2019-10-16 DIAGNOSIS — I25700 Atherosclerosis of coronary artery bypass graft(s), unspecified, with unstable angina pectoris: Secondary | ICD-10-CM

## 2019-10-16 DIAGNOSIS — Z7901 Long term (current) use of anticoagulants: Secondary | ICD-10-CM

## 2019-10-16 DIAGNOSIS — I495 Sick sinus syndrome: Secondary | ICD-10-CM

## 2019-10-16 DIAGNOSIS — E782 Mixed hyperlipidemia: Secondary | ICD-10-CM

## 2019-10-16 DIAGNOSIS — E785 Hyperlipidemia, unspecified: Secondary | ICD-10-CM

## 2019-10-16 DIAGNOSIS — I4811 Longstanding persistent atrial fibrillation: Secondary | ICD-10-CM

## 2019-10-16 DIAGNOSIS — E1169 Type 2 diabetes mellitus with other specified complication: Secondary | ICD-10-CM

## 2019-10-16 MED ORDER — ISOSORBIDE MONONITRATE ER 60 MG PO TB24: 60.0000 mg | ORAL_TABLET | Freq: Two times a day (BID) | ORAL | 5 refills | Status: DC

## 2019-10-16 NOTE — Patient Instructions (Signed)
Medication Instructions:  TAKE: Aspirin 325 mg today (10/16/19), then take Aspirin 81 mg once daily through your appointment in April.  INCREASE: Isosorbide (Imdur) to 60 mg twice daily  *If you need a refill on your cardiac medications before your next appointment, please call your pharmacy*  Lab Work: None ordered If you have labs (blood work) drawn today and your tests are completely normal, you will receive your results only by: Marland Kitchen MyChart Message (if you have MyChart) OR . A paper copy in the mail If you have any lab test that is abnormal or we need to change your treatment, we will call you to review the results.  Testing/Procedures: None ordered  Follow-Up: At [facility], you and your health needs are our priority.  As part of our continuing mission to provide you with exceptional heart care, we have created designated Provider Care Teams.  These Care Teams include your primary Cardiologist (physician) and Advanced Practice Providers (APPs -  Physician Assistants and Nurse Practitioners) who all work together to provide you with the care you need, when you need it.  Your next appointment:   Follow up with Dr. Sallyanne Kuster on a pacer day in April 2021

## 2019-10-16 NOTE — Telephone Encounter (Signed)
Virtual Visit Pre-Appointment Phone Call  "(Name), I am calling you today to discuss your upcoming appointment. We are currently trying to limit exposure to the virus that causes COVID-19 by seeing patients at home rather than in the office."  1. "What is the BEST phone number to call the day of the visit?" - include this in appointment notes  2. "Do you have or have access to (through a family member/friend) a smartphone with video capability that we can use for your visit?" a. If yes - list this number in appt notes as "cell" (if different from BEST phone #) and list the appointment type as a VIDEO visit in appointment notes b. If no - list the appointment type as a PHONE visit in appointment notes  3. Confirm consent - "In the setting of the current Covid19 crisis, you are scheduled for a (phone or video) visit with your provider on (date) at (time).  Just as we do with many in-office visits, in order for you to participate in this visit, we must obtain consent.  If you'd like, I can send this to your mychart (if signed up) or email for you to review.  Otherwise, I can obtain your verbal consent now.  All virtual visits are billed to your insurance company just like a normal visit would be.  By agreeing to a virtual visit, we'd like you to understand that the technology does not allow for your provider to perform an examination, and thus may limit your provider's ability to fully assess your condition. If your provider identifies any concerns that need to be evaluated in person, we will make arrangements to do so.  Finally, though the technology is pretty good, we cannot assure that it will always work on either your or our end, and in the setting of a video visit, we may have to convert it to a phone-only visit.  In either situation, we cannot ensure that we have a secure connection.  Are you willing to proceed?" Yes  4. Advise patient to be prepared - "Two hours prior to your appointment, go  ahead and check your blood pressure, pulse, oxygen saturation, and your weight (if you have the equipment to check those) and write them all down. When your visit starts, your provider will ask you for this information. If you have an Apple Watch or Kardia device, please plan to have heart rate information ready on the day of your appointment. Please have a pen and paper handy nearby the day of the visit as well."  5. Give patient instructions for MyChart download to smartphone OR Doximity/Doxy.me as below if video visit (depending on what platform provider is using)  6. Inform patient they will receive a phone call 15 minutes prior to their appointment time (may be from unknown caller ID) so they should be prepared to answer    TELEPHONE CALL NOTE  Lucas Porter has been deemed a candidate for a follow-up tele-health visit to limit community exposure during the Covid-19 pandemic. I spoke with the patient via phone to ensure availability of phone/video source, confirm preferred email & phone number, and discuss instructions and expectations.  I reminded Lucas Porter to be prepared with any vital sign and/or heart rhythm information that could potentially be obtained via home monitoring, at the time of his visit. I reminded Lucas Porter to expect a phone call prior to his visit.  Lucas Barker, RN 10/16/2019 9:30 AM   INSTRUCTIONS  FOR DOWNLOADING THE MYCHART APP TO SMARTPHONE  - The patient must first make sure to have activated MyChart and know their login information - If Apple, go to [employer] and type in MyChart in the search bar and download the app. If Android, ask patient to go to [employer] and type in [place] in the search bar and download the app. The app is free but as with any other app downloads, their phone may require them to verify saved payment information or Apple/Android password.  - The patient will need to then log into the app with their MyChart username  and password, and select [facility] as their healthcare provider to link the account. When it is time for your visit, go to the MyChart app, find appointments, and click Begin Video Visit. Be sure to Select Allow for your device to access the Microphone and Camera for your visit. You will then be connected, and your provider will be with you shortly.  **If they have any issues connecting, or need assistance please contact MyChart service desk [phone])**  **If using a computer, in order to ensure the best quality for their visit they will need to use either of the following Internet Browsers: [employer], or Google Chrome**  IF USING DOXIMITY or DOXY.ME - The patient will receive a link just prior to their visit by text.     FULL LENGTH CONSENT FOR TELE-HEALTH VISIT   I hereby voluntarily request, consent and authorize [facility] and its employed or contracted physicians, physician assistants, nurse practitioners or other licensed health care professionals (the Practitioner), to provide me with telemedicine health care services (the "Services") as deemed necessary by the treating Practitioner. I acknowledge and consent to receive the Services by the Practitioner via telemedicine. I understand that the telemedicine visit will involve communicating with the Practitioner through live audiovisual communication technology and the disclosure of certain medical information by electronic transmission. I acknowledge that I have been given the opportunity to request an in-person assessment or other available alternative prior to the telemedicine visit and am voluntarily participating in the telemedicine visit.  I understand that I have the right to withhold or withdraw my consent to the use of telemedicine in the course of my care at any time, without affecting my right to future care or treatment, and that the Practitioner or I may terminate the telemedicine visit at any time. I  understand that I have the right to inspect all information obtained and/or recorded in the course of the telemedicine visit and may receive copies of available information for a reasonable fee.  I understand that some of the potential risks of receiving the Services via telemedicine include:  Marland Kitchen Delay or interruption in medical evaluation due to technological equipment failure or disruption; . Information transmitted may not be sufficient (e.g. poor resolution of images) to allow for appropriate medical decision making by the Practitioner; and/or  . In rare instances, security protocols could fail, causing a breach of personal health information.  Furthermore, I acknowledge that it is my responsibility to provide information about my medical history, conditions and care that is complete and accurate to the best of my ability. I acknowledge that Practitioner's advice, recommendations, and/or decision may be based on factors not within their control, such as incomplete or inaccurate data provided by me or distortions of diagnostic images or specimens that may result from electronic transmissions. I understand that the practice of medicine is not an exact science and  that Practitioner makes no warranties or guarantees regarding treatment outcomes. I acknowledge that I will receive a copy of this consent concurrently upon execution via email to the email address I last provided but may also request a printed copy by calling the office of [facility].    I understand that my insurance will be billed for this visit.   I have read or had this consent read to me. . I understand the contents of this consent, which adequately explains the benefits and risks of the Services being provided via telemedicine.  . I have been provided ample opportunity to ask questions regarding this consent and the Services and have had my questions answered to my satisfaction. . I give my informed consent for the services to be  provided through the use of telemedicine in my medical care  By participating in this telemedicine visit I agree to the above.

## 2019-10-16 NOTE — Progress Notes (Signed)
Virtual Visit via Video Note   This visit type was conducted due to national recommendations for restrictions regarding the COVID-19 Pandemic (e.g. social distancing) in an effort to limit this patient's exposure and mitigate transmission in our community.  Due to his co-morbid illnesses, this patient is at least at moderate risk for complications without adequate follow up.  This format is felt to be most appropriate for this patient at this time.  All issues noted in this document were discussed and addressed.  A limited physical exam was performed with this format.  Please refer to the patient's chart for his consent to telehealth for [employer].   Date:  10/16/2019   ID:  Lucas Porter, DOB 1956-05-02, MRN [id number]  Patient Location: Other:  car Provider Location: Other:  [place]  PCP:  Patient, No Pcp Per  Cardiologist:  [name], MD  Electrophysiologist:  None   Evaluation Performed:  Follow-Up Visit  Chief Complaint:  Angina, crescendo  History of Present Illness:    Lucas Porter is a 64 y.o. 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  He has had 2 episodes of angina this week.  On Monday he woke up with a sensation of panic and had chest tightness.  He had to take 2 sublingual nitroglycerin tablets before his symptoms subsided, although he felt a little uneasy in his chest for the remainder of the day.  On Tuesday he had less severe symptoms that resolved promptly after a single sublingual nitroglycerin.  He has been  okay for the last 3 days.  He is on chronic warfarin anticoagulation and takes clopidogrel (aspirin was stopped after the first 30 days from his stents).  He has not had any bleeding problems.  He denies falls or injuries.  He has not had palpitations or syncope.  He does not dyspnea, focal neurological complaints, lower extremity edema, claudication, fever/chills, cough, wheezing or weight changes.  He has not had syncope or even prodromal symptoms of vasovagal events.  Pacemaker interrogation shows his dual-chamber device, now programmed VVIR, has an estimated longevity of 13 years, with 78% ventricular pacing (he is on both metoprolol and diltiazem as antianginal medications in addition to being rate control medications).  The heart rate histogram looks a little blunted.  There have been no episodes of RVR.  [name] also has a history of neurocardiogenic syncope, which thankfully has not worsened since he started treatment with nitrates.  The patient does not have symptoms concerning for COVID-19 infection (fever, chills, cough, or new shortness of breath).    Past Medical History:  Diagnosis Date   Anxiety    Atrial fibrillation (HCC)    paroxysmal   Atrial flutter ([place])    Coagulopathy ([place])    persistent elevation of ACT during 10/17/11 hospitalization   Coronary artery disease    s/p CABG 2010, s/p Successful PCI  10/17/11 of Distal Left Main and the Left Circumflex with a Promus Element DES stent -- 4.0 mm x 16 mm postdilated to 4.12 mm    DDD (degenerative disc disease)    Depression    Dysrhythmia    afib   Facial asymmetry 2010  pt. stated it was very temporary, never seen by doctor for it.    GERD (gastroesophageal reflux disease)    Heart murmur    Hyperlipidemia    Hypertension    Kidney stones    Presence of permanent cardiac pacemaker    Medtronic    S/P coronary artery stent placement to distal Lt. main and Lt. Cx 10/17/11   Tachycardia-bradycardia  syndrome ([place]) 2/12   s/p PPM (MDT) by Dr Blanch Media   Past Surgical History:  Procedure Laterality Date   ATRIAL FIBRILLATION ABLATION  04/24/12, 12/02/12   PVI x2 and CTI ablation by Dr Rayann Heman   ATRIAL FIBRILLATION ABLATION N/A 04/24/2012   Procedure: ATRIAL FIBRILLATION ABLATION;  Surgeon: Thompson Grayer, MD;  [facility] C[facility] LAB;  Service: Cardiovascular;  Laterality: N/A;   ATRIAL FIBRILLATION ABLATION N/A 12/02/2012   Procedure: ATRIAL FIBRILLATION ABLATION;  Surgeon: Thompson Grayer, MD;  [facility] C[facility] LAB;  Service: Cardiovascular;  Laterality: N/A;   CORONARY ANGIOPLASTY  10/19/11   CORONARY ARTERY BYPASS GRAFT  2010   CORONARY STENT INTERVENTION N/A 04/13/2019   Procedure: CORONARY STENT INTERVENTION;  Surgeon: Nelva Bush, MD;  Location: [facility] CV LAB;  Service: Cardiovascular;  Laterality: N/A;   CORONARY STENT INTERVENTION N/A 07/28/2019   Procedure: CORONARY STENT INTERVENTION;  Surgeon: Jettie Booze, MD;  Location: [place] CV LAB;  Service: Cardiovascular;  Laterality: N/A;   CYSTOSCOPY  2007 ?   treatment for Kidney stones    INGUINAL HERNIA REPAIR Left 01/04/2016   Procedure: LAPAROSCOPIC LEFT INGUINAL HERNIA WITH MESH;  Surgeon: Ralene Ok, MD;  Location: [facility];  Service: General;  Laterality: Left;   INSERT / REPLACE / REMOVE PACEMAKER  2/12   Medtronic   INSERTION OF MESH Left 01/04/2016   Procedure: INSERTION OF MESH;  Surgeon: Ralene Ok, MD;  Location: [facility];  Service: General;  Laterality: Left;   LAPAROSCOPIC LYSIS OF ADHESIONS Left 01/04/2016   Procedure: LAPAROSCOPIC LYSIS OF ADHESIONS;  Surgeon: Ralene Ok, MD;  Location: [place];  Service: General;  Laterality: Left;   LEFT HEART CATH AND CORS/GRAFTS ANGIOGRAPHY N/A 02/20/2019   Procedure: LEFT HEART CATH AND CORS/GRAFTS ANGIOGRAPHY;  Surgeon: Belva Crome, MD;  Location: [place] CV LAB;  Service: Cardiovascular;  Laterality: N/A;   LEFT HEART CATH AND CORS/GRAFTS  ANGIOGRAPHY N/A 04/13/2019   Procedure: LEFT HEART CATH AND CORS/GRAFTS ANGIOGRAPHY;  Surgeon: Nelva Bush, MD;  Location: [place] CV LAB;  Service: Cardiovascular;  Laterality: N/A;   LEFT HEART CATH AND CORS/GRAFTS ANGIOGRAPHY N/A 07/28/2019   Procedure: LEFT HEART CATH AND CORS/GRAFTS ANGIOGRAPHY;  Surgeon: Jettie Booze, MD;  Location: [place] CV LAB;  Service: Cardiovascular;  Laterality: N/A;   LEFT HEART CATHETERIZATION WITH CORONARY/GRAFT ANGIOGRAM N/A 10/17/2011   Procedure: LEFT HEART CATHETERIZATION WITH Beatrix Fetters;  Surgeon: [name], MD;  Location: [facility] CATH LAB;  Service: Cardiovascular;  Laterality: N/A;   LEFT HEART CATHETERIZATION WITH CORONARY/GRAFT ANGIOGRAM N/A 02/08/2012   Procedure: LEFT HEART CATHETERIZATION WITH Beatrix Fetters;  Surgeon: [name], MD;  Location: [facility] CATH LAB;  Service: Cardiovascular;  Laterality: N/A;   NM MYOCAR PERF WALL MOTION  03/23/2011   Low risk   PERCUTANEOUS CORONARY STENT INTERVENTION (PCI-S)  10/17/2011   Procedure: PERCUTANEOUS CORONARY STENT INTERVENTION (PCI-S);  Surgeon: [name], MD;  Location: [facility] CATH LAB;  Service: Cardiovascular;;   PPM GENERATOR CHANGEOUT N/A 02/20/2019   Procedure: PPM GENERATOR CHANGEOUT;  Surgeon: [name], MD;  [facility]  INVASIVE CV LAB;  Service: Cardiovascular;  Laterality: N/A;   TEE WITHOUT CARDIOVERSION  04/23/2012   Procedure: TRANSESOPHAGEAL ECHOCARDIOGRAM (TEE);  Surgeon: Josue Hector, MD;  Location: [facility] ENDOSCOPY;  Service: Cardiovascular;  Laterality: N/A;  spoke to pt about time change from 11a to 12p/DL   TEE WITHOUT CARDIOVERSION N/A 12/01/2012   Procedure: TRANSESOPHAGEAL ECHOCARDIOGRAM (TEE);  Surgeon: [name], MD;  Location: [facility] ENDOSCOPY;  Service: Cardiovascular;  Laterality: N/A;     Current Meds  Medication Sig   acetaminophen (TYLENOL) 500 MG tablet Take 1,000 mg by mouth every 6 (six) hours as needed for moderate pain.    aspirin 81 MG chewable tablet Chew 1 tablet (81 mg total) by mouth daily.   clonazePAM (KLONOPIN) 0.5 MG tablet Take 1 tablet (0.5 mg total) by mouth 2 (two) times daily as needed for anxiety.   clopidogrel (PLAVIX) 75 MG tablet Take 1 tablet (75 mg total) by mouth daily.   DILT-XR 180 MG 24 hr capsule Take 1 capsule by mouth once daily   Doxylamine Succinate, Sleep, (SLEEP AID PO) Take 1 tablet by mouth at bedtime as needed (sleep).   fish oil-omega-3 fatty acids 1000 MG capsule Take 2 g by mouth daily.    isosorbide mononitrate (IMDUR) 60 MG 24 hr tablet Take 1 tablet (60 mg total) by mouth 2 (two) times daily.   lisinopril (ZESTRIL) 20 MG tablet Take 1 tablet (20 mg total) by mouth daily.   metFORMIN (GLUCOPHAGE) 500 MG tablet Take 1 tablet (500 mg total) by mouth 2 (two) times daily with a meal.   metoprolol tartrate (LOPRESSOR) 100 MG tablet Take 100 mg by mouth 2 (two) times daily.   potassium chloride (K-DUR) 10 MEQ tablet TAKE 1  BY MOUTH ONCE DAILY (Patient taking differently: Take 10 mEq by mouth daily. )   rosuvastatin (CRESTOR) 40 MG tablet TAKE 1 TABLET BY MOUTH ONCE DAILY (Patient taking differently: Take 40 mg by mouth daily. )   venlafaxine XR (EFFEXOR-XR) 150 MG 24 hr capsule TAKE ONE CAPSULE BY MOUTH ONCE DAILY WITH  THE  75  MG  TO  TOTAL  225  MG  DAILY (Patient taking differently: Take 150 mg by mouth daily. )   venlafaxine XR (EFFEXOR-XR) 75 MG 24 hr capsule TAKE ONE CAPSULE BY MOUTH ONCE DAILY TAKE  WITH  150MG   TO  EQUAL  A  TOTAL  OF  225MG   DAILY (Patient taking differently: Take 75 mg by mouth at bedtime. )   warfarin (COUMADIN) 5 MG tablet TAKE 1/2 TO 1 (ONE-HALF TO ONE) TABLET BY MOUTH ONCE DAILY AS DIRECTED BY COUMADIN CLINIC (Patient taking differently: Take 2.5-5 mg by mouth See admin instructions. Take 2.5 mg at night on Mon, Wed, Fri, and Sat.  Take 5 mg at night on Tues, Thur, and Sun.)   [DISCONTINUED] isosorbide mononitrate (IMDUR) 30 MG 24 hr  tablet Take 3 tablets (90 mg total) by mouth daily.     Allergies:   Patient has no known allergies.   Social History   Tobacco Use   Smoking status: Former Smoker    Years: 1.00    Types: Cigarettes    Quit date: 12/18/1963    Years since quitting: 55.8   Smokeless tobacco: Former Systems developer    Quit date: 10/03/2012   Tobacco comment: quit smoking in 1970's  Substance Use Topics   Alcohol use: Yes    Alcohol/week: 1.0 standard drinks    Types: 1 Cans of beer  per week    Comment: rare   Drug use: Yes    Types: Marijuana    Comment: sometimes (down from every day)     Family Hx: The patient's family history includes Alzheimer's disease in his father; Colon cancer in his mother; Coronary artery disease in an other family member; Diabetes in his brother; Heart attack in his brother; Heart disease in his brother and father; Hypertension in his brother, brother, and sister; Stroke in his brother. There is no history of Stomach cancer or Pancreatic cancer.  ROS:   Please see the history of present illness.    All other systems reviewed and are negative.   Prior CV studies:   The following studies were reviewed today: Coronary angiography July 28, 2019  Labs/Other Tests and Data Reviewed:    EKG:  An ECG dated 03/25/2019 was personally reviewed today and demonstrated:  Atrial fibrillation with controlled ventricular rate, right axis deviation, nonspecific T wave changes, QTc interval 476 ms  Recent Labs: 07/28/2019: ALT 23 07/29/2019: BUN 8; Creatinine, Ser 0.70; Hemoglobin 13.4; Platelets 183; Potassium 3.6; Sodium 137   Recent Lipid Panel Lab Results  Component Value Date/Time   CHOL 117 04/09/2019 03:32 PM   TRIG 253 (H) 04/09/2019 03:32 PM   HDL 40 04/09/2019 03:32 PM   CHOLHDL 2.9 04/09/2019 03:32 PM   CHOLHDL 5.9 (H) 09/24/2016 10:10 AM   LDLCALC 26 04/09/2019 03:32 PM    Wt Readings from Last 3 Encounters:  10/16/19 220 lb (99.8 kg)  08/10/19 222 lb 9.6 oz  (101 kg)  07/28/19 225 lb (102.1 kg)     Objective:    Vital Signs:  Ht 5\' 10"  (1.778 m)    Wt 220 lb (99.8 kg)    BMI 31.57 kg/m    Vital signs reviewed. Unable to examine.  ASSESSMENT & PLAN:    1. CAD s/p CABG and recent PCI x4 w angina: He has had multiple revascularization procedures in the last 12 months.  I think we should add aspirin back, since he has not had any bleeding complications and he has episodes of unexplained angina at rest.  We will do this at least through his next appointment in 3 months.  Continue clopidogrel and warfarin as well.  Increase isosorbide to 60 mg twice daily.  He is also on 2 other antianginal medications.  Consider adding Ranexa. 2. AFib: Longest persistent arrhythmia, probably permanent.  Well rate controlled on diltiazem and metoprolol. 3. PM: Normal device function.  He is not pacemaker dependent. 4. Anticoagulation: Relatively low embolic risk: CHADSVasc 2 (HTN, CAD).  Tolerating warfarin plus clopidogrel without bleeding complications.  Will restart aspirin due to the concerning episodes of recurrent angina pectoris. 5. Neurally mediated syncope: He has not had recurrent syncope ever since his pacemaker was implanted and he has not even had recent prodromal symptoms.  I hope he will tolerate the higher dose of nitrates. 6. Obesity/DM/HLP: He would benefit from weight loss, but his LDL (27) was excellent and his hemoglobin A1c (7.2%) was close to target range.  His HDL (40) will only improve if he loses a lot of weight.  COVID-19 Education: The signs and symptoms of COVID-19 were discussed with the patient and how to seek care for testing (follow up with PCP or arrange E-visit).  The importance of social distancing was discussed today.  Time:   Today, I have spent 29 minutes with the patient with telehealth technology discussing the above problems.  Medication Adjustments/Labs and Tests Ordered: Current medicines are reviewed at length with  the patient today.  Concerns regarding medicines are outlined above.   Tests Ordered: No orders of the defined types were placed in this encounter.   Medication Changes: Meds ordered this encounter  Medications   isosorbide mononitrate (IMDUR) 60 MG 24 hr tablet    Sig: Take 1 tablet (60 mg total) by mouth 2 (two) times daily.    Dispense:  60 tablet    Refill:  5   Patient Instructions  Medication Instructions:  TAKE: Aspirin 325 mg today (10/16/19), then take Aspirin 81 mg once daily through your appointment in April.  INCREASE: Isosorbide (Imdur) to 60 mg twice daily  *If you need a refill on your cardiac medications before your next appointment, please call your pharmacy*  Lab Work: None ordered If you have labs (blood work) drawn today and your tests are completely normal, you will receive your results only by:  [facility] (if you have MyChart) OR  A paper copy in the mail If you have any lab test that is abnormal or we need to change your treatment, we will call you to review the results.  Testing/Procedures: None ordered  Follow-Up: At [facility], you and your health needs are our priority.  As part of our continuing mission to provide you with exceptional heart care, we have created designated Provider Care Teams.  These Care Teams include your primary Cardiologist (physician) and Advanced Practice Providers (APPs -  Physician Assistants and Nurse Practitioners) who all work together to provide you with the care you need, when you need it.  Your next appointment:   Follow up with Dr. Sallyanne Kuster on a pacer day in April 2021     Follow Up:  In Person 3 months  Signed, [name], MD  10/16/2019 10:37 AM    [place]

## 2019-10-26 ENCOUNTER — Other Ambulatory Visit: Payer: Self-pay | Admitting: Cardiovascular Disease

## 2019-10-29 ENCOUNTER — Ambulatory Visit (INDEPENDENT_AMBULATORY_CARE_PROVIDER_SITE_OTHER): Payer: BC Managed Care – PPO | Admitting: Pharmacist

## 2019-10-29 ENCOUNTER — Other Ambulatory Visit: Payer: Self-pay

## 2019-10-29 DIAGNOSIS — Z7901 Long term (current) use of anticoagulants: Secondary | ICD-10-CM | POA: Diagnosis not present

## 2019-10-29 DIAGNOSIS — I480 Paroxysmal atrial fibrillation: Secondary | ICD-10-CM | POA: Diagnosis not present

## 2019-10-29 LAB — POCT INR: INR: 3 (ref 2.0–3.0)

## 2019-11-24 ENCOUNTER — Ambulatory Visit (INDEPENDENT_AMBULATORY_CARE_PROVIDER_SITE_OTHER): Payer: BC Managed Care – PPO | Admitting: *Deleted

## 2019-11-24 DIAGNOSIS — I495 Sick sinus syndrome: Secondary | ICD-10-CM

## 2019-11-24 LAB — CUP PACEART REMOTE DEVICE CHECK
Battery Remaining Longevity: 155 mo
Battery Voltage: 3.08 V
Brady Statistic AP VP Percent: 0 %
Brady Statistic AP VS Percent: 0 %
Brady Statistic AS VP Percent: 49.96 %
Brady Statistic AS VS Percent: 50.04 %
Brady Statistic RA Percent Paced: 0 %
Brady Statistic RV Percent Paced: 49.96 %
Date Time Interrogation Session: 20210309004144
Implantable Lead Implant Date: 20120229
Implantable Lead Implant Date: 20120229
Implantable Lead Location: 753859
Implantable Lead Location: 753860
Implantable Pulse Generator Implant Date: 20200605
Lead Channel Impedance Value: 342 Ohm
Lead Channel Impedance Value: 494 Ohm
Lead Channel Impedance Value: 551 Ohm
Lead Channel Impedance Value: 551 Ohm
Lead Channel Pacing Threshold Amplitude: 0.75 V
Lead Channel Pacing Threshold Amplitude: 0.75 V
Lead Channel Pacing Threshold Pulse Width: 0.4 ms
Lead Channel Pacing Threshold Pulse Width: 0.4 ms
Lead Channel Sensing Intrinsic Amplitude: 0.5 mV
Lead Channel Sensing Intrinsic Amplitude: 0.5 mV
Lead Channel Sensing Intrinsic Amplitude: 22.375 mV
Lead Channel Sensing Intrinsic Amplitude: 22.375 mV
Lead Channel Setting Pacing Amplitude: 2.5 V
Lead Channel Setting Pacing Pulse Width: 0.4 ms
Lead Channel Setting Sensing Sensitivity: 2 mV

## 2019-11-25 NOTE — Progress Notes (Signed)
PPM Remote  

## 2019-12-10 ENCOUNTER — Other Ambulatory Visit: Payer: Self-pay

## 2019-12-10 ENCOUNTER — Ambulatory Visit (INDEPENDENT_AMBULATORY_CARE_PROVIDER_SITE_OTHER): Payer: BC Managed Care – PPO | Admitting: Pharmacist Clinician (PhC)/ Clinical Pharmacy Specialist

## 2019-12-10 DIAGNOSIS — Z7901 Long term (current) use of anticoagulants: Secondary | ICD-10-CM

## 2019-12-10 DIAGNOSIS — I480 Paroxysmal atrial fibrillation: Secondary | ICD-10-CM

## 2019-12-10 LAB — POCT INR: INR: 2.7 (ref 2.0–3.0)

## 2020-01-02 ENCOUNTER — Other Ambulatory Visit: Payer: Self-pay | Admitting: Cardiovascular Disease

## 2020-01-20 ENCOUNTER — Ambulatory Visit (INDEPENDENT_AMBULATORY_CARE_PROVIDER_SITE_OTHER): Payer: BC Managed Care – PPO | Admitting: *Deleted

## 2020-01-20 ENCOUNTER — Other Ambulatory Visit: Payer: Self-pay

## 2020-01-20 DIAGNOSIS — Z7901 Long term (current) use of anticoagulants: Secondary | ICD-10-CM

## 2020-01-20 DIAGNOSIS — Z5181 Encounter for therapeutic drug level monitoring: Secondary | ICD-10-CM

## 2020-01-20 DIAGNOSIS — I480 Paroxysmal atrial fibrillation: Secondary | ICD-10-CM | POA: Diagnosis not present

## 2020-01-20 LAB — POCT INR: INR: 2.7 (ref 2.0–3.0)

## 2020-01-20 NOTE — Patient Instructions (Signed)
Description   Continue 1/2 tablet everyday except 1 pill on Mondays and Fridays.  Repeat INR in 6 weeks

## 2020-02-09 ENCOUNTER — Encounter (HOSPITAL_COMMUNITY): Payer: Self-pay

## 2020-02-09 ENCOUNTER — Emergency Department (HOSPITAL_COMMUNITY): Payer: BC Managed Care – PPO

## 2020-02-09 ENCOUNTER — Other Ambulatory Visit: Payer: Self-pay

## 2020-02-09 ENCOUNTER — Encounter (HOSPITAL_COMMUNITY)
Admission: EM | Disposition: A | Discharge status: Home / Self Care | Payer: Self-pay | Source: Home / Self Care | Attending: Emergency Medicine

## 2020-02-09 ENCOUNTER — Observation Stay (HOSPITAL_COMMUNITY)
Admission: EM | Admit: 2020-02-09 | Discharge: 2020-02-10 | Disposition: A | Discharge status: Home / Self Care | Payer: BC Managed Care – PPO | Attending: Cardiology | Admitting: Cardiology

## 2020-02-09 DIAGNOSIS — Z79899 Other long term (current) drug therapy: Secondary | ICD-10-CM | POA: Diagnosis not present

## 2020-02-09 DIAGNOSIS — Z950 Presence of cardiac pacemaker: Secondary | ICD-10-CM

## 2020-02-09 DIAGNOSIS — Z951 Presence of aortocoronary bypass graft: Secondary | ICD-10-CM | POA: Diagnosis not present

## 2020-02-09 DIAGNOSIS — Z7901 Long term (current) use of anticoagulants: Secondary | ICD-10-CM

## 2020-02-09 DIAGNOSIS — I25719 Atherosclerosis of autologous vein coronary artery bypass graft(s) with unspecified angina pectoris: Secondary | ICD-10-CM

## 2020-02-09 DIAGNOSIS — I420 Dilated cardiomyopathy: Secondary | ICD-10-CM | POA: Diagnosis not present

## 2020-02-09 DIAGNOSIS — I25119 Atherosclerotic heart disease of native coronary artery with unspecified angina pectoris: Secondary | ICD-10-CM

## 2020-02-09 DIAGNOSIS — I25810 Atherosclerosis of coronary artery bypass graft(s) without angina pectoris: Secondary | ICD-10-CM | POA: Diagnosis not present

## 2020-02-09 DIAGNOSIS — Z7902 Long term (current) use of antithrombotics/antiplatelets: Secondary | ICD-10-CM | POA: Diagnosis not present

## 2020-02-09 DIAGNOSIS — Z7982 Long term (current) use of aspirin: Secondary | ICD-10-CM | POA: Insufficient documentation

## 2020-02-09 DIAGNOSIS — R55 Syncope and collapse: Secondary | ICD-10-CM | POA: Diagnosis not present

## 2020-02-09 DIAGNOSIS — I200 Unstable angina: Secondary | ICD-10-CM | POA: Diagnosis present

## 2020-02-09 DIAGNOSIS — I495 Sick sinus syndrome: Secondary | ICD-10-CM | POA: Diagnosis not present

## 2020-02-09 DIAGNOSIS — I4821 Permanent atrial fibrillation: Secondary | ICD-10-CM | POA: Diagnosis present

## 2020-02-09 DIAGNOSIS — K219 Gastro-esophageal reflux disease without esophagitis: Secondary | ICD-10-CM | POA: Insufficient documentation

## 2020-02-09 DIAGNOSIS — Z955 Presence of coronary angioplasty implant and graft: Secondary | ICD-10-CM | POA: Diagnosis not present

## 2020-02-09 DIAGNOSIS — Z20822 Contact with and (suspected) exposure to covid-19: Secondary | ICD-10-CM | POA: Insufficient documentation

## 2020-02-09 DIAGNOSIS — I10 Essential (primary) hypertension: Secondary | ICD-10-CM | POA: Diagnosis not present

## 2020-02-09 DIAGNOSIS — I340 Nonrheumatic mitral (valve) insufficiency: Secondary | ICD-10-CM | POA: Insufficient documentation

## 2020-02-09 DIAGNOSIS — Z87891 Personal history of nicotine dependence: Secondary | ICD-10-CM | POA: Diagnosis not present

## 2020-02-09 DIAGNOSIS — I2584 Coronary atherosclerosis due to calcified coronary lesion: Secondary | ICD-10-CM | POA: Insufficient documentation

## 2020-02-09 DIAGNOSIS — Z8249 Family history of ischemic heart disease and other diseases of the circulatory system: Secondary | ICD-10-CM | POA: Diagnosis not present

## 2020-02-09 DIAGNOSIS — E119 Type 2 diabetes mellitus without complications: Secondary | ICD-10-CM

## 2020-02-09 DIAGNOSIS — I4892 Unspecified atrial flutter: Secondary | ICD-10-CM | POA: Insufficient documentation

## 2020-02-09 DIAGNOSIS — Z833 Family history of diabetes mellitus: Secondary | ICD-10-CM | POA: Diagnosis not present

## 2020-02-09 DIAGNOSIS — E785 Hyperlipidemia, unspecified: Secondary | ICD-10-CM | POA: Diagnosis not present

## 2020-02-09 DIAGNOSIS — R072 Precordial pain: Secondary | ICD-10-CM

## 2020-02-09 DIAGNOSIS — I209 Angina pectoris, unspecified: Secondary | ICD-10-CM | POA: Diagnosis present

## 2020-02-09 HISTORY — PX: LEFT HEART CATH AND CORS/GRAFTS ANGIOGRAPHY: CATH118250

## 2020-02-09 LAB — CBC
HCT: 42.9 % (ref 39.0–52.0)
Hemoglobin: 14.2 g/dL (ref 13.0–17.0)
MCH: 28.5 pg (ref 26.0–34.0)
MCHC: 33.1 g/dL (ref 30.0–36.0)
MCV: 86.1 fL (ref 80.0–100.0)
Platelets: 203 10*3/uL (ref 150–400)
RBC: 4.98 MIL/uL (ref 4.22–5.81)
RDW: 14 % (ref 11.5–15.5)
WBC: 7.4 10*3/uL (ref 4.0–10.5)
nRBC: 0 % (ref 0.0–0.2)

## 2020-02-09 LAB — GLUCOSE, CAPILLARY
Glucose-Capillary: 191 mg/dL — ABNORMAL HIGH (ref 70–99)
Glucose-Capillary: 178 mg/dL — ABNORMAL HIGH (ref 70–99)

## 2020-02-09 LAB — BASIC METABOLIC PANEL
Anion gap: 12 (ref 5–15)
BUN: 15 mg/dL (ref 8–23)
CO2: 24 mmol/L (ref 22–32)
Calcium: 9.6 mg/dL (ref 8.9–10.3)
Chloride: 102 mmol/L (ref 98–111)
Creatinine, Ser: 0.95 mg/dL (ref 0.61–1.24)
GFR calc Af Amer: >60 mL/min (ref >60)
GFR calc non Af Amer: >60 mL/min (ref >60)
Glucose, Bld: 246 mg/dL — ABNORMAL HIGH (ref 70–99)
Potassium: 4.2 mmol/L (ref 3.5–5.1)
Sodium: 138 mmol/L (ref 135–145)

## 2020-02-09 LAB — PROTIME-INR
INR: 1.7 — ABNORMAL HIGH (ref 0.8–1.2)
Prothrombin Time: 19.1 seconds — ABNORMAL HIGH (ref 11.4–15.2)

## 2020-02-09 LAB — HEMOGLOBIN A1C
Hgb A1c MFr Bld: 7.9 % — ABNORMAL HIGH (ref 4.8–5.6)
Mean Plasma Glucose: 180.03 mg/dL

## 2020-02-09 LAB — SARS CORONAVIRUS 2 BY RT PCR (HOSPITAL ORDER, PERFORMED IN ~~LOC~~ HOSPITAL LAB): SARS Coronavirus 2: NEGATIVE

## 2020-02-09 LAB — TROPONIN I (HIGH SENSITIVITY)
Troponin I (High Sensitivity): 22 ng/L — ABNORMAL HIGH (ref <18)
Troponin I (High Sensitivity): 23 ng/L — ABNORMAL HIGH (ref <18)

## 2020-02-09 SURGERY — LEFT HEART CATH AND CORS/GRAFTS ANGIOGRAPHY
Anesthesia: LOCAL

## 2020-02-09 MED ORDER — SODIUM CHLORIDE 0.9% FLUSH
3.0000 mL | Freq: Two times a day (BID) | INTRAVENOUS | Status: DC
  Administered 2020-02-09: 3 mL via INTRAVENOUS

## 2020-02-09 MED ORDER — SODIUM CHLORIDE 0.9 % WEIGHT BASED INFUSION
3.0000 mL/kg/h | INTRAVENOUS | Status: AC
  Administered 2020-02-09: 3 mL/kg/h via INTRAVENOUS

## 2020-02-09 MED ORDER — METOPROLOL TARTRATE 100 MG PO TABS
100.0000 mg | ORAL_TABLET | Freq: Two times a day (BID) | ORAL | Status: DC
  Administered 2020-02-09 (×2): 100 mg via ORAL
  Filled 2020-02-09: qty 1
  Filled 2020-02-09: qty 4
  Filled 2020-02-09: qty 1

## 2020-02-09 MED ORDER — SODIUM CHLORIDE 0.9 % IV SOLN: INTRAVENOUS | Status: AC

## 2020-02-09 MED ORDER — SODIUM CHLORIDE 0.9% FLUSH: 3.0000 mL | INTRAVENOUS | Status: DC | PRN

## 2020-02-09 MED ORDER — LIDOCAINE HCL (PF) 1 % IJ SOLN
INTRAMUSCULAR | Status: DC | PRN
  Administered 2020-02-09: 20 mL

## 2020-02-09 MED ORDER — SODIUM CHLORIDE 0.9 % WEIGHT BASED INFUSION: 1.0000 mL/kg/h | INTRAVENOUS | Status: DC

## 2020-02-09 MED ORDER — ASPIRIN EC 81 MG PO TBEC: 81.0000 mg | DELAYED_RELEASE_TABLET | Freq: Every day | ORAL | Status: DC

## 2020-02-09 MED ORDER — SODIUM CHLORIDE 0.9% FLUSH: 3.0000 mL | Freq: Two times a day (BID) | INTRAVENOUS | Status: DC

## 2020-02-09 MED ORDER — ASPIRIN 300 MG RE SUPP: 300.0000 mg | RECTAL | Status: AC

## 2020-02-09 MED ORDER — LABETALOL HCL 5 MG/ML IV SOLN: 10.0000 mg | INTRAVENOUS | Status: DC | PRN

## 2020-02-09 MED ORDER — ONDANSETRON HCL 4 MG/2ML IJ SOLN: 4.0000 mg | Freq: Four times a day (QID) | INTRAMUSCULAR | Status: DC | PRN

## 2020-02-09 MED ORDER — ACETAMINOPHEN 325 MG PO TABS: 650.0000 mg | ORAL_TABLET | ORAL | Status: DC | PRN

## 2020-02-09 MED ORDER — FENTANYL CITRATE (PF) 100 MCG/2ML IJ SOLN
INTRAMUSCULAR | Status: DC | PRN
  Administered 2020-02-09 (×2): 25 ug via INTRAVENOUS

## 2020-02-09 MED ORDER — DILTIAZEM HCL ER COATED BEADS 180 MG PO CP24
180.0000 mg | ORAL_CAPSULE | Freq: Every day | ORAL | Status: DC
  Administered 2020-02-10: 180 mg via ORAL
  Filled 2020-02-09 (×2): qty 1

## 2020-02-09 MED ORDER — LIDOCAINE HCL (PF) 1 % IJ SOLN
INTRAMUSCULAR | Status: AC
  Filled 2020-02-09: qty 30

## 2020-02-09 MED ORDER — IOHEXOL 350 MG/ML SOLN
INTRAVENOUS | Status: DC | PRN
  Administered 2020-02-09: 50 mL

## 2020-02-09 MED ORDER — WARFARIN - PHARMACIST DOSING INPATIENT: Freq: Every day | Status: DC

## 2020-02-09 MED ORDER — VERAPAMIL HCL 2.5 MG/ML IV SOLN
INTRAVENOUS | Status: AC
  Filled 2020-02-09: qty 2

## 2020-02-09 MED ORDER — ROSUVASTATIN CALCIUM 20 MG PO TABS
40.0000 mg | ORAL_TABLET | Freq: Every day | ORAL | Status: DC
  Administered 2020-02-09 – 2020-02-10 (×2): 40 mg via ORAL
  Filled 2020-02-09 (×2): qty 2

## 2020-02-09 MED ORDER — HEPARIN SODIUM (PORCINE) 5000 UNIT/ML IJ SOLN
5000.0000 [IU] | Freq: Three times a day (TID) | INTRAMUSCULAR | Status: DC
  Administered 2020-02-09 – 2020-02-10 (×2): 5000 [IU] via SUBCUTANEOUS
  Filled 2020-02-09 (×2): qty 1

## 2020-02-09 MED ORDER — FENTANYL CITRATE (PF) 100 MCG/2ML IJ SOLN
INTRAMUSCULAR | Status: AC
  Filled 2020-02-09: qty 2

## 2020-02-09 MED ORDER — INSULIN ASPART 100 UNIT/ML ~~LOC~~ SOLN
0.0000 [IU] | Freq: Three times a day (TID) | SUBCUTANEOUS | Status: DC
  Administered 2020-02-10: 3 [IU] via SUBCUTANEOUS

## 2020-02-09 MED ORDER — NITROGLYCERIN IN D5W 200-5 MCG/ML-% IV SOLN
0.0000 ug/min | INTRAVENOUS | Status: DC
  Administered 2020-02-09: 5 ug/min via INTRAVENOUS
  Administered 2020-02-10: 25 ug/min via INTRAVENOUS
  Administered 2020-02-10: 20 ug/min via INTRAVENOUS
  Administered 2020-02-10: 30 ug/min via INTRAVENOUS
  Filled 2020-02-09: qty 250

## 2020-02-09 MED ORDER — MIDAZOLAM HCL 2 MG/2ML IJ SOLN
INTRAMUSCULAR | Status: DC | PRN
  Administered 2020-02-09 (×2): 1 mg via INTRAVENOUS

## 2020-02-09 MED ORDER — ASPIRIN 81 MG PO CHEW: 81.0000 mg | CHEWABLE_TABLET | ORAL | Status: DC

## 2020-02-09 MED ORDER — MIDAZOLAM HCL 2 MG/2ML IJ SOLN
INTRAMUSCULAR | Status: AC
  Filled 2020-02-09: qty 2

## 2020-02-09 MED ORDER — SODIUM CHLORIDE 0.9 % IV SOLN: 250.0000 mL | INTRAVENOUS | Status: DC | PRN

## 2020-02-09 MED ORDER — IOHEXOL 350 MG/ML SOLN
INTRAVENOUS | Status: AC
  Filled 2020-02-09: qty 1

## 2020-02-09 MED ORDER — HEPARIN (PORCINE) IN NACL 1000-0.9 UT/500ML-% IV SOLN
INTRAVENOUS | Status: DC | PRN
  Administered 2020-02-09 (×2): 500 mL

## 2020-02-09 MED ORDER — CLOPIDOGREL BISULFATE 75 MG PO TABS
75.0000 mg | ORAL_TABLET | Freq: Every day | ORAL | Status: DC
  Administered 2020-02-09 – 2020-02-10 (×2): 75 mg via ORAL
  Filled 2020-02-09 (×2): qty 1

## 2020-02-09 MED ORDER — ISOSORBIDE MONONITRATE ER 60 MG PO TB24
60.0000 mg | ORAL_TABLET | Freq: Two times a day (BID) | ORAL | Status: DC
  Administered 2020-02-09 (×2): 60 mg via ORAL
  Filled 2020-02-09: qty 2
  Filled 2020-02-09: qty 1

## 2020-02-09 MED ORDER — HEPARIN (PORCINE) IN NACL 1000-0.9 UT/500ML-% IV SOLN
INTRAVENOUS | Status: AC
  Filled 2020-02-09: qty 1000

## 2020-02-09 MED ORDER — HYDRALAZINE HCL 20 MG/ML IJ SOLN: 10.0000 mg | INTRAMUSCULAR | Status: DC | PRN

## 2020-02-09 MED ORDER — ASPIRIN 81 MG PO CHEW
324.0000 mg | CHEWABLE_TABLET | ORAL | Status: AC
  Administered 2020-02-09: 324 mg via ORAL
  Filled 2020-02-09: qty 4

## 2020-02-09 MED ORDER — ACETAMINOPHEN 500 MG PO TABS
1000.0000 mg | ORAL_TABLET | Freq: Four times a day (QID) | ORAL | Status: DC | PRN
  Administered 2020-02-10 (×2): 1000 mg via ORAL
  Filled 2020-02-09 (×2): qty 2

## 2020-02-09 MED ORDER — LISINOPRIL 20 MG PO TABS
20.0000 mg | ORAL_TABLET | Freq: Every day | ORAL | Status: DC
  Administered 2020-02-09 – 2020-02-10 (×2): 20 mg via ORAL
  Filled 2020-02-09 (×2): qty 1

## 2020-02-09 MED ORDER — WARFARIN SODIUM 5 MG PO TABS
5.0000 mg | ORAL_TABLET | Freq: Once | ORAL | Status: DC
  Filled 2020-02-09: qty 1

## 2020-02-09 MED ORDER — ASPIRIN 81 MG PO CHEW
81.0000 mg | CHEWABLE_TABLET | Freq: Every day | ORAL | Status: DC
  Administered 2020-02-10: 81 mg via ORAL
  Filled 2020-02-09: qty 1

## 2020-02-09 SURGICAL SUPPLY — 9 items
CATH INFINITI 5FR MULTPACK ANG (CATHETERS) ×1 IMPLANT
CLOSURE MYNX CONTROL 5F (Vascular Products) ×1 IMPLANT
KIT HEART LEFT (KITS) ×2 IMPLANT
PACK CARDIAC CATHETERIZATION (CUSTOM PROCEDURE TRAY) ×2 IMPLANT
SHEATH PINNACLE 5F 10CM (SHEATH) ×1 IMPLANT
SHEATH PROBE COVER 6X72 (BAG) ×1 IMPLANT
TRANSDUCER W/STOPCOCK (MISCELLANEOUS) ×2 IMPLANT
TUBING CIL FLEX 10 FLL-RA (TUBING) ×2 IMPLANT
WIRE EMERALD 3MM-J .035X150CM (WIRE) ×1 IMPLANT

## 2020-02-09 NOTE — Plan of Care (Signed)

## 2020-02-09 NOTE — Progress Notes (Addendum)
ANTICOAGULATION CONSULT NOTE - Initial Consult  Pharmacy Consult for Warfarin Indication: atrial fibrillation  No Known Allergies  Patient Measurements:    Vital Signs: Temp: 98.6 F (37 C) (05/25 0842) Temp Source: Oral (05/25 0842) BP: 146/74 (05/25 1245) Pulse Rate: 64 (05/25 1245)  Labs: Recent Labs    02/09/20 0855 02/09/20 1134  HGB 14.2  --   HCT 42.9  --   PLT 203  --   LABPROT  --  19.1*  INR  --  1.7*  CREATININE 0.95  --   TROPONINIHS 22* 23*    CrCl cannot be calculated (Unknown ideal weight.).   Medical History: Past Medical History:  Diagnosis Date  . Anxiety   . Atrial fibrillation (HCC)    paroxysmal  . Atrial flutter ([place])   . Coagulopathy ([place])    persistent elevation of ACT during 10/17/11 hospitalization  . Coronary artery disease    s/p CABG 2010, s/p Successful PCI  10/17/11 of Distal Left Main and the Left Circumflex with a Promus Element DES stent -- 4.0 mm x 16 mm postdilated to 4.12 mm   . DDD (degenerative disc disease)   . Depression   . Dysrhythmia    afib  . Facial asymmetry 2010   pt. stated it was very temporary, never seen by doctor for it.   Marland Kitchen GERD (gastroesophageal reflux disease)   . Heart murmur   . Hyperlipidemia   . Hypertension   . Kidney stones   . Presence of permanent cardiac pacemaker    Medtronic   . S/P coronary artery stent placement to distal Lt. main and Lt. Cx 10/17/11  . 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 Patient takes warfarin outpatient, dosed by the coumadin clinic. Pharmacy consulted to resume warfarin for atrial fibrillation.   Last dose of warfarin reported taken yesterday, 5/24. INR currently 1.7. CBC WNL.   Home warfarin regimen: 5 mg on Monday and Friday and 2.5 mg all other days.  Patient has [place] planned for today, warfarin to be resumed afterwards.  Goal of Therapy:  INR  2-3 Monitor platelets by anticoagulation protocol: Yes   Plan:  - Warfarin 5 mg PO x1 at 1800 after LHC - Daily INR and CBC - Monitor for signs and symptoms of bleeding     Cressida Milford L. Devin Going, [place] PGY1 Pharmacy Resident [phone] 02/09/20      2:04 PM  Please check AMION for all [place] phone numbers After 10:00 PM, call the [facility] [phone]

## 2020-02-09 NOTE — ED Triage Notes (Signed)
Pt from home with ems for chest pain that started around 630 this morning, pt took 2 nitro and 324 ASA at home with some relief of pain, EMS gave him another Nitro, pain went from a 4/10 to 0/10. Pt denies any other symptoms, significant cardiac hx. VSS, HR paced. 22G left Hand.

## 2020-02-09 NOTE — Progress Notes (Signed)
Received pt from Cathlab ,pt alert and oriented, no complaints of any pain or discomfort, wife at bedside.Marland KitchenMarland KitchenR femoral site level 0, dressing CDI. Nitro drip infusing @ 36mcg.

## 2020-02-09 NOTE — Interval H&P Note (Signed)
Cath Lab Visit (complete for each Cath Lab visit)  Clinical Evaluation Leading to the Procedure:   ACS: Yes.    Non-ACS:    Anginal Classification: CCS IV  Anti-ischemic medical therapy: Maximal Therapy (2 or more classes of medications)  Non-Invasive Test Results: No non-invasive testing performed  Prior CABG: Previous CABG      History and Physical Interval Note:  02/09/2020 4:12 PM  Lucas Porter  has presented today for surgery, with the diagnosis of Unstable Angina.  The various methods of treatment have been discussed with the patient and family. After consideration of risks, benefits and other options for treatment, the patient has consented to  Procedure(s): LEFT HEART CATH AND CORS/GRAFTS ANGIOGRAPHY (N/A) as a surgical intervention.  The patient's history has been reviewed, patient examined, no change in status, stable for surgery.  I have reviewed the patient's chart and labs.  Questions were answered to the patient's satisfaction.     Sherren Mocha

## 2020-02-09 NOTE — ED Provider Notes (Signed)
[facility] EMERGENCY DEPARTMENT Provider Note   CSN: [id number] Arrival date & time: 02/09/20  [id number]     History Chief Complaint  Patient presents with  . Chest Pain    Lucas Porter is a 64 y.o. male.  Patient with extensive history of CAD (details below), chronic anticoagulation/antiplatelet warfarin use, persistent atrial fibrillation, pacemaker placement --presents the emergency department for evaluation of chest tightness.  Patient states that he awoke from sleep around 6 AM today with tightness across his lower chest.  This did not radiate.  He did not have any associated vomiting or diaphoresis.  Patient took a nitroglycerin with improvement of his symptoms which were short-lived.  At around 7 he called his wife and asked her to come home.  He took a second nitroglycerin with the same result.  EMS was then called.  They administered a third nitroglycerin.  Upon her ED arrival chest discomfort had resolved.  It has since then started again since being evaluated in triage.  Patient describes this as tightness across the bilateral lower chest.  No shortness of breath.  No fevers or cough.  No abdominal pain.  Cardiac sx hx: s/p CABG x 4 (2010) with DES to left main and Cx 2013, DES to SVG-D and DES to distal LCx 03/2019. Heart cath on 07/28/19 for unstable angina revealed patent LIMA-LAD, SVG-D1 patent, SVG-ramus patent, SVR-RCA occluded with left-right collaterals, DES placed to 3rd marginal which was 95% stenosed. DES placed to ostial-proximal Cx which was 90% stenosed.         Past Medical History:  Diagnosis Date  . Anxiety   . Atrial fibrillation (HCC)    paroxysmal  . Atrial flutter ([place])   . Coagulopathy ([place])    persistent elevation of ACT during 10/17/11 hospitalization  . Coronary artery disease    s/p CABG 2010, s/p Successful PCI  10/17/11 of Distal Left Main and the Left Circumflex with a Promus Element DES stent -- 4.0 mm x 16 mm postdilated to  4.12 mm   . DDD (degenerative disc disease)   . Depression   . Dysrhythmia    afib  . Facial asymmetry 2010   pt. stated it was very temporary, never seen by doctor for it.   Marland Kitchen GERD (gastroesophageal reflux disease)   . Heart murmur   . Hyperlipidemia   . Hypertension   . Kidney stones   . Presence of permanent cardiac pacemaker    Medtronic   . S/P coronary artery stent placement to distal Lt. main and Lt. Cx 10/17/11  . 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 Unstable angina ([place]) 04/13/2019  . Tachycardia-bradycardia syndrome ([place])   . Pacemaker battery depletion   . Memory difficulty 02/04/2018  . Hearing loss 11/05/2017  . Diabetes mellitus type 2, uncomplicated ([place]) [id number]  . Cerebrovascular disease, unspecified 05/22/2017  . 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 Morbid obesity ([place]) 10/12/2015  . 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 Symptomatic bradycardia 03/09/2013  . Long term (current) use of anticoagulants 12/03/2012  . BMI 40.0-44.9, adult ([place]) 05/13/2012  . Atrial flutter ([place]) 04/24/2012  . S/P coronary artery stent placement to distal Lt. main and Lt. Cx, 10/17/11 10/19/2011  . Coronary artery disease involving native coronary artery of native heart with unstable angina pectoris ([place]) 10/19/2011  . Coagulopathy ([place]) 10/19/2011  . History  of pacemaker, with a-v pacing 10/19/2011  . CAD (coronary artery disease) with CABG 2011, LIMA to LAD, Free radial to Lt. PDA, VG to Diag and ramus intermedius 10/02/2011  . Anxiety 10/02/2011  . Paroxysmal atrial fibrillation ([place]) 10/02/2011  . Hypertension 10/02/2011  . Dyslipidemia 10/02/2011  . 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 ATRIAL FIBRILLATION ABLATION  04/24/12, 12/02/12   PVI x2 and CTI ablation by Dr Rayann Heman  . ATRIAL FIBRILLATION  ABLATION N/A 04/24/2012   Procedure: ATRIAL FIBRILLATION ABLATION;  Surgeon: Thompson Grayer, MD;  Location: [facility] CATH LAB;  Service: Cardiovascular;  Laterality: N/A;  . ATRIAL FIBRILLATION ABLATION N/A 12/02/2012   Procedure: ATRIAL FIBRILLATION ABLATION;  Surgeon: Thompson Grayer, MD;  Location: [facility] CATH LAB;  Service: Cardiovascular;  Laterality: N/A;  . CORONARY ANGIOPLASTY  10/19/11  . CORONARY ARTERY BYPASS GRAFT  2010  . CORONARY STENT INTERVENTION N/A 04/13/2019   Procedure: CORONARY STENT INTERVENTION;  Surgeon: Nelva Bush, MD;  Location: [facility] CV LAB;  Service: Cardiovascular;  Laterality: N/A;  . CORONARY STENT INTERVENTION N/A 07/28/2019   Procedure: CORONARY STENT INTERVENTION;  Surgeon: Jettie Booze, MD;  Location: [facility] CV LAB;  Service: Cardiovascular;  Laterality: N/A;  . CYSTOSCOPY  2007 ?   treatment for Kidney stones   . INGUINAL HERNIA REPAIR Left 01/04/2016   Procedure: LAPAROSCOPIC LEFT INGUINAL HERNIA WITH MESH;  Surgeon: Ralene Ok, MD;  Location: [place];  Service: General;  Laterality: Left;  . INSERT / REPLACE / REMOVE PACEMAKER  2/12   Medtronic  . INSERTION OF MESH Left 01/04/2016   Procedure: INSERTION OF MESH;  Surgeon: Ralene Ok, MD;  Location: [facility];  Service: General;  Laterality: Left;  . LAPAROSCOPIC LYSIS OF ADHESIONS Left 01/04/2016   Procedure: LAPAROSCOPIC LYSIS OF ADHESIONS;  Surgeon: Ralene Ok, MD;  Location: [facility];  Service: General;  Laterality: Left;  . LEFT HEART CATH AND CORS/GRAFTS ANGIOGRAPHY N/A 02/20/2019   Procedure: LEFT HEART CATH AND CORS/GRAFTS ANGIOGRAPHY;  Surgeon: Belva Crome, MD;  Location: [facility] CV LAB;  Service: Cardiovascular;  Laterality: N/A;  . LEFT HEART CATH AND CORS/GRAFTS ANGIOGRAPHY N/A 04/13/2019   Procedure: LEFT HEART CATH AND CORS/GRAFTS ANGIOGRAPHY;  Surgeon: Nelva Bush, MD;  Location: [facility] CV LAB;  Service: Cardiovascular;  Laterality: N/A;  . LEFT HEART CATH AND  CORS/GRAFTS ANGIOGRAPHY N/A 07/28/2019   Procedure: LEFT HEART CATH AND CORS/GRAFTS ANGIOGRAPHY;  Surgeon: Jettie Booze, MD;  Location: [facility] CV LAB;  Service: Cardiovascular;  Laterality: N/A;  . LEFT HEART CATHETERIZATION WITH CORONARY/GRAFT ANGIOGRAM N/A 10/17/2011   Procedure: LEFT HEART CATHETERIZATION WITH Beatrix Fetters;  Surgeon: [name], MD;  Location: [facility] CATH LAB;  Service: Cardiovascular;  Laterality: N/A;  . LEFT HEART CATHETERIZATION WITH CORONARY/GRAFT ANGIOGRAM N/A 02/08/2012   Procedure: LEFT HEART CATHETERIZATION WITH Beatrix Fetters;  Surgeon: [name], MD;  Location: [facility] CATH LAB;  Service: Cardiovascular;  Laterality: N/A;  . NM MYOCAR PERF WALL MOTION  03/23/2011   Low risk  . PERCUTANEOUS CORONARY STENT INTERVENTION (PCI-S)  10/17/2011   Procedure: PERCUTANEOUS CORONARY STENT INTERVENTION (PCI-S);  Surgeon: [name], MD;  Location: [facility] CATH LAB;  Service: Cardiovascular;;  . PPM GENERATOR CHANGEOUT N/A 02/20/2019   Procedure: PPM GENERATOR CHANGEOUT;  Surgeon: [name], MD;  Location: [facility] CV LAB;  Service: Cardiovascular;  Laterality: N/A;  . TEE WITHOUT CARDIOVERSION  04/23/2012   Procedure: TRANSESOPHAGEAL ECHOCARDIOGRAM (TEE);  Surgeon:  Josue Hector, MD;  Location: [facility] ENDOSCOPY;  Service: Cardiovascular;  Laterality: N/A;  spoke to pt about time change from 11a to 12p/DL  . TEE WITHOUT CARDIOVERSION N/A 12/01/2012   Procedure: TRANSESOPHAGEAL ECHOCARDIOGRAM (TEE);  Surgeon: [name], MD;  Location: [facility] ENDOSCOPY;  Service: Cardiovascular;  Laterality: N/A;       Family History  Problem Relation Age of Onset  . Colon cancer Mother   . Alzheimer's disease Father   . Heart disease Father   . Hypertension Sister   . Stroke Brother   . Hypertension Brother   . Heart attack Brother   . Heart disease Brother   . Hypertension Brother   . Diabetes Brother   . Coronary artery disease Other   . Stomach cancer  Neg Hx   . Pancreatic cancer Neg Hx     Social History   Tobacco Use  . Smoking status: Former Smoker    Years: 1.00    Types: Cigarettes    Quit date: 12/18/1963    Years since quitting: 56.1  . Smokeless tobacco: Former Systems developer    Quit date: 10/03/2012  . Tobacco comment: quit smoking in 1970's  Substance Use Topics  . Alcohol use: Yes    Alcohol/week: 1.0 standard drinks    Types: 1 Cans of beer per week    Comment: rare  . Drug use: Yes    Types: Marijuana    Comment: sometimes (down from every day)    Home Medications Prior to Admission medications   Medication Sig Start Date End Date Taking? Authorizing Provider  acetaminophen (TYLENOL) 500 MG tablet Take 1,000 mg by mouth every 6 (six) hours as needed for moderate pain.    [provider]  aspirin 81 MG chewable tablet Chew 1 tablet (81 mg total) by mouth daily. 07/30/19   Cheryln Manly, NP  clonazePAM (KLONOPIN) 0.5 MG tablet Take 1 tablet (0.5 mg total) by mouth 2 (two) times daily as needed for anxiety. 12/13/14   Copland, Gay Filler, MD  clopidogrel (PLAVIX) 75 MG tablet Take 1 tablet by mouth once daily 01/04/20   Croitoru, Mihai, MD  DILT-XR 180 MG 24 hr capsule Take 1 capsule by mouth once daily 05/05/19   Croitoru, Mihai, MD  Doxylamine Succinate, Sleep, (SLEEP AID PO) Take 1 tablet by mouth at bedtime as needed (sleep).    [provider]  fish oil-omega-3 fatty acids 1000 MG capsule Take 2 g by mouth daily.     [provider]  isosorbide mononitrate (IMDUR) 60 MG 24 hr tablet Take 1 tablet (60 mg total) by mouth 2 (two) times daily. 10/16/19   Croitoru, Mihai, MD  lisinopril (ZESTRIL) 20 MG tablet Take 1 tablet (20 mg total) by mouth daily. 04/16/19   Croitoru, Mihai, MD  metFORMIN (GLUCOPHAGE) 500 MG tablet Take 1 tablet (500 mg total) by mouth 2 (two) times daily with a meal. 06/18/18   Delia Chimes A, MD  metoprolol tartrate (LOPRESSOR) 100 MG tablet Take 100 mg by mouth 2 (two) times  daily.    [provider]  nitroGLYCERIN (NITROSTAT) 0.4 MG SL tablet Place 1 tablet (0.4 mg total) under the tongue every 5 (five) minutes as needed for up to 10 days for chest pain. 03/25/19 07/27/19  Janeece Fitting, PA-C  potassium chloride (K-DUR) 10 MEQ tablet TAKE 1  BY MOUTH ONCE DAILY Patient taking differently: Take 10 mEq by mouth daily.  04/13/19   Croitoru, Dani Gobble, MD  rosuvastatin (CRESTOR)  40 MG tablet TAKE 1 TABLET BY MOUTH ONCE DAILY Patient taking differently: Take 40 mg by mouth daily.  11/01/17   Harrison Mons, PA  venlafaxine XR (EFFEXOR-XR) 150 MG 24 hr capsule TAKE ONE CAPSULE BY MOUTH ONCE DAILY WITH  THE  75  MG  TO  TOTAL  225  MG  DAILY Patient taking differently: Take 150 mg by mouth daily.  07/12/17   Harrison Mons, PA  venlafaxine XR (EFFEXOR-XR) 75 MG 24 hr capsule TAKE ONE CAPSULE BY MOUTH ONCE DAILY TAKE  WITH  150MG   TO  EQUAL  A  TOTAL  OF  225MG   DAILY Patient taking differently: Take 75 mg by mouth at bedtime.  07/12/17   Harrison Mons, PA  warfarin (COUMADIN) 5 MG tablet TAKE 1/2 TO 1 (ONE-HALF TO ONE) TABLET BY MOUTH ONCE DAILY AS  DIRECTED  BY  COUMADIN  CLINIC 10/26/19   Croitoru, Dani Gobble, MD    Allergies    Patient has no known allergies.  Review of Systems   Review of Systems  Constitutional: Negative for diaphoresis and fever.  Eyes: Negative for redness.  Respiratory: Positive for chest tightness. Negative for cough and shortness of breath.   Cardiovascular: Positive for chest pain. Negative for palpitations and leg swelling.  Gastrointestinal: Negative for abdominal pain, nausea and vomiting.  Genitourinary: Negative for dysuria.  Musculoskeletal: Negative for back pain and neck pain.  Skin: Negative for rash.  Neurological: Negative for syncope and light-headedness.  Psychiatric/Behavioral: The patient is not nervous/anxious.     Physical Exam Updated Vital Signs BP (!) 116/93 (BP Location: Right Arm)   Pulse 63   Temp 98.6 F (37 C)  (Oral)   Resp 18   SpO2 100%   Physical Exam Vitals and nursing note reviewed.  Constitutional:      Appearance: He is well-developed. He is not diaphoretic.  HENT:     Head: Normocephalic and atraumatic.     Mouth/Throat:     Mouth: Mucous membranes are not dry.  Eyes:     Conjunctiva/sclera: Conjunctivae normal.  Neck:     Vascular: Normal carotid pulses. No carotid bruit or JVD.     Trachea: Trachea normal. No tracheal deviation.  Cardiovascular:     Rate and Rhythm: Normal rate and regular rhythm.     Pulses: No decreased pulses.     Heart sounds: Normal heart sounds, S1 normal and S2 normal. Heart sounds not distant. No murmur.  Pulmonary:     Effort: Pulmonary effort is normal. No respiratory distress.     Breath sounds: Normal breath sounds. No wheezing.  Chest:     Chest wall: No tenderness.  Abdominal:     General: Bowel sounds are normal.     Palpations: Abdomen is soft.     Tenderness: There is no abdominal tenderness. There is no guarding or rebound.  Musculoskeletal:     Cervical back: Normal range of motion and neck supple. No muscular tenderness.  Skin:    General: Skin is warm and dry.     Coloration: Skin is not pale.  Neurological:     Mental Status: He is alert.     ED Results / Procedures / Treatments   Labs (all labs ordered are listed, but only abnormal results are displayed) Labs Reviewed  BASIC METABOLIC PANEL - Abnormal; Notable for the following components:      Result Value   Glucose, Bld 246 (*)    All other components within normal  limits  PROTIME-INR - Abnormal; Notable for the following components:   Prothrombin Time 19.1 (*)    INR 1.7 (*)    All other components within normal limits  TROPONIN I (HIGH SENSITIVITY) - Abnormal; Notable for the following components:   Troponin I (High Sensitivity) 22 (*)    All other components within normal limits  TROPONIN I (HIGH SENSITIVITY) - Abnormal; Notable for the following components:    Troponin I (High Sensitivity) 23 (*)    All other components within normal limits  SARS CORONAVIRUS 2 BY RT PCR (HOSPITAL ORDER, [facility])  CBC  HEMOGLOBIN A1C  CBC  CREATININE, SERUM    EKG EKG Interpretation  Date/Time:  Tuesday Feb 09 2020 08:36:32 EDT Ventricular Rate:  64 PR Interval:    QRS Duration: 78 QT Interval:  370 QTC Calculation: 381 R Axis:   73 Text Interpretation: Ventricular Paced Rhythm Confirmed by Veryl Speak [id number]) on 02/09/2020 2:02:57 PM   Radiology DG Chest 2 View  Result Date: 02/09/2020 CLINICAL DATA:  Chest pain since 6 a.m. this morning. History of CABG surgery. EXAM: CHEST - 2 VIEW COMPARISON:  07/27/2019 FINDINGS: Cardiac silhouette is normal in size. Changes from CABG surgery are stable. Left coronary artery stent, also stable. No mediastinal or hilar masses. No evidence of adenopathy. Left anterior chest wall sequential pacemaker is stable and well positioned. Clear lungs.  No pleural effusion or pneumothorax. Skeletal structures are intact. IMPRESSION: No acute cardiopulmonary disease. Electronically Signed   By: Lajean Manes M.D.   On: 02/09/2020 09:05    Procedures Procedures (including critical care time)  Medications Ordered in ED Medications  nitroGLYCERIN 50 mg in dextrose 5 % 250 mL (0.2 mg/mL) infusion (5 mcg/min Intravenous New Bag/Given 02/09/20 1026)  aspirin chewable tablet 81 mg (has no administration in time range)  acetaminophen (TYLENOL) tablet 1,000 mg (has no administration in time range)  diltiazem (DILACOR XR) 24 hr capsule 180 mg (has no administration in time range)  isosorbide mononitrate (IMDUR) 24 hr tablet 60 mg (has no administration in time range)  lisinopril (ZESTRIL) tablet 20 mg (has no administration in time range)  metoprolol tartrate (LOPRESSOR) tablet 100 mg (has no administration in time range)  rosuvastatin (CRESTOR) tablet 40 mg (has no administration in time range)    clopidogrel (PLAVIX) tablet 75 mg (has no administration in time range)  aspirin chewable tablet 324 mg (has no administration in time range)    Or  aspirin suppository 300 mg (has no administration in time range)  aspirin EC tablet 81 mg (has no administration in time range)  acetaminophen (TYLENOL) tablet 650 mg (has no administration in time range)  ondansetron (ZOFRAN) injection 4 mg (has no administration in time range)  heparin injection 5,000 Units (has no administration in time range)  sodium chloride flush (NS) 0.9 % injection 3 mL (has no administration in time range)  insulin aspart (novoLOG) injection 0-15 Units (has no administration in time range)    ED Course  I have reviewed the triage vital signs and the nursing notes.  Pertinent labs & imaging results that were available during my care of the patient were reviewed by me and considered in my medical decision making (see chart for details).   Patient seen and examined. EKG reviewed. Labs ordered. I requested cardiology consult due to concern for unstable angina.  Given recurrent chest tightness, patient will be started on nitroglycerin drip.  Vital signs reviewed and are as  follows: BP (!) 116/93 (BP Location: Right Arm)   Pulse 63   Temp 98.6 F (37 C) (Oral)   Resp 18   SpO2 100%   Cardiology has seen. Plan for admit. Troponin monitored -- stable.    COVID ordered.   CRITICAL CARE Performed by: Carlisle Cater PA-C Total critical care time: 40 minutes Critical care time was exclusive of separately billable procedures and treating other patients. Critical care was necessary to treat or prevent imminent or life-threatening deterioration. Critical care was time spent personally by me on the following activities: development of treatment plan with patient and/or surrogate as well as nursing, discussions with consultants, evaluation of patient's response to treatment, examination of patient, obtaining history from  patient or surrogate, ordering and performing treatments and interventions, ordering and review of laboratory studies, ordering and review of radiographic studies, pulse oximetry and re-evaluation of patient's condition.     MDM Rules/Calculators/A&P                      Admit.    Final Clinical Impression(s) / ED Diagnoses Final diagnoses:  Unstable angina [facility])    Rx / DC Orders ED Discharge Orders    None       Carlisle Cater, PA-C 02/09/20 1404    Veryl Speak, MD 02/09/20 1445

## 2020-02-09 NOTE — H&P (Signed)
Cardiology Admission History and Physical:   Patient ID: Lucas Porter MRN: [id number]; DOB: [birth year]   Admission date: 02/09/2020  Primary Care Provider: Patient, No Pcp Per Primary Cardiologist: [name], MD   Chief Complaint:  Chest pain   Patient Profile:   Lucas Porter is a 64 y.o. male with a hx of persistent atrial fibrillation with hx of redo ablation and failed dofetilide, on coumadin, HTN, SSS s/p PPM with gent change 2020, DM, HLD, and GERD.   Extensive CAD includes: CABG x 4 (2010) with DES to left main and Cx 2013, DES to SVG-D and DES to distal LCx 03/2019. Most recent [facility] 07/28/19 for unstable angina revealed patent LIMA-LAD, SVG-D1 patent, SVG-ramus patent, SVR-RCA occluded with left-right collaterals, DES placed to 3rd marginal which was 95% stenosed. DES placed to ostial-proximal Cx which was 90% stenosed.   History of Present Illness:   Lucas Porter is a 64 year old male with a history as stated above who presented to [facility] on 02/09/20 with chest pain which began approximately 0630 am. Pain initially described as sharp, anterior chest pain which subsided to a pressure/tightness across his whole anterior chest. He had no associated SOB, N/V or diaphoresis. He took 2 SL NTG tablets as well as ASA 324mg  with some, but minimal relief. Continues to have mild chest pressure on exam. He reports having an additional several weeks ago at which time he took continue with resolution. On chart review, he was having angina at last OV with Dr. Sallyanne Kuster at which time his isosorbide was up-titrated to 60mg  and his ASA was restarted (now on triple antiplatelet/anticoagulation therapy) with plans for close follow up. Given given his cardiac hx and recent PCI, he called EMS for further evaluation.    On EMS arrival he was given an additional NTG tablet with pain reduction from 4/10 to 0/10. In the ED, hsT found to be 22>>23 (hsT noted to be minimally elevated during recent  hospitalization with PCI placement 07/2019 39>>42). EKG with AF, V paced at 64bpm and no acute changes from prior tracings. CXR with no acute cardiopulmonary disease.   Lucas Porter has a complex cardiac history with the most recent hospital admission 07/2019 at which time he underwent LHC for unstable angina which revealed patent LIMA-LAD, SVG-D1 patent, SVG-ramus patent, SVR-RCA occluded with left-right collaterals, DES placed to 3rd marginal which was 95% stenosed. DES placed to ostial-proximal Cx which was 90% stenosed.  Plan at that time was to pursue triple therapy with ASA/Plavix and Coumadin for 1 month after intervention then discontinue ASA.  He was seen in follow-up 08/10/2019 at which time he had several twinges of chest pain and intermittent DOE but was otherwise stable.  Past Medical History:  Diagnosis Date   Anxiety    Atrial fibrillation (HCC)    paroxysmal   Atrial flutter ([place])    Coagulopathy ([place])    persistent elevation of ACT during 10/17/11 hospitalization   Coronary artery disease    s/p CABG 2010, s/p Successful PCI  10/17/11 of Distal Left Main and the Left Circumflex with a Promus Element DES stent -- 4.0 mm x 16 mm postdilated to 4.12 mm    DDD (degenerative disc disease)    Depression    Dysrhythmia    afib   Facial asymmetry 2010   pt. stated it was very temporary, never seen by doctor for it.    GERD (gastroesophageal reflux disease)    Heart murmur    Hyperlipidemia  Hypertension    Kidney stones    Presence of permanent cardiac pacemaker    Medtronic    S/P coronary artery stent placement to distal Lt. main and Lt. 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   s/p PPM (MDT) by Dr Blanch Media    Past Surgical History:  Procedure Laterality Date   ATRIAL FIBRILLATION ABLATION  04/24/12, 12/02/12   PVI x2 and CTI ablation by Dr Rayann Heman   ATRIAL FIBRILLATION ABLATION N/A 04/24/2012   Procedure: ATRIAL FIBRILLATION ABLATION;   Surgeon: Thompson Grayer, MD;  Location: [facility] CATH LAB;  Service: Cardiovascular;  Laterality: N/A;   ATRIAL FIBRILLATION ABLATION N/A 12/02/2012   Procedure: ATRIAL FIBRILLATION ABLATION;  Surgeon: Thompson Grayer, MD;  Location: [facility] CATH LAB;  Service: Cardiovascular;  Laterality: N/A;   CORONARY ANGIOPLASTY  10/19/11   CORONARY ARTERY BYPASS GRAFT  2010   CORONARY STENT INTERVENTION N/A 04/13/2019   Procedure: CORONARY STENT INTERVENTION;  Surgeon: Nelva Bush, MD;  Location: [place] CV LAB;  Service: Cardiovascular;  Laterality: N/A;   CORONARY STENT INTERVENTION N/A 07/28/2019   Procedure: CORONARY STENT INTERVENTION;  Surgeon: Jettie Booze, MD;  Location: [place] CV LAB;  Service: Cardiovascular;  Laterality: N/A;   CYSTOSCOPY  2007 ?   treatment for Kidney stones    INGUINAL HERNIA REPAIR Left 01/04/2016   Procedure: LAPAROSCOPIC LEFT INGUINAL HERNIA WITH MESH;  Surgeon: Ralene Ok, MD;  Location: [place];  Service: General;  Laterality: Left;   INSERT / REPLACE / REMOVE PACEMAKER  2/12   Medtronic   INSERTION OF MESH Left 01/04/2016   Procedure: INSERTION OF MESH;  Surgeon: Ralene Ok, MD;  Location: [place];  Service: General;  Laterality: Left;   LAPAROSCOPIC LYSIS OF ADHESIONS Left 01/04/2016   Procedure: LAPAROSCOPIC LYSIS OF ADHESIONS;  Surgeon: Ralene Ok, MD;  Location: [place];  Service: General;  Laterality: Left;   LEFT HEART CATH AND CORS/GRAFTS ANGIOGRAPHY N/A 02/20/2019   Procedure: LEFT HEART CATH AND CORS/GRAFTS ANGIOGRAPHY;  Surgeon: Belva Crome, MD;  Location: [place] CV LAB;  Service: Cardiovascular;  Laterality: N/A;   LEFT HEART CATH AND CORS/GRAFTS ANGIOGRAPHY N/A 04/13/2019   Procedure: LEFT HEART CATH AND CORS/GRAFTS ANGIOGRAPHY;  Surgeon: Nelva Bush, MD;  Location: [place] CV LAB;  Service: Cardiovascular;  Laterality: N/A;   LEFT HEART CATH AND CORS/GRAFTS ANGIOGRAPHY N/A 07/28/2019   Procedure: LEFT HEART CATH AND  CORS/GRAFTS ANGIOGRAPHY;  Surgeon: Jettie Booze, MD;  Location: [place] CV LAB;  Service: Cardiovascular;  Laterality: N/A;   LEFT HEART CATHETERIZATION WITH CORONARY/GRAFT ANGIOGRAM N/A 10/17/2011   Procedure: LEFT HEART CATHETERIZATION WITH Beatrix Fetters;  Surgeon: [name], MD;  Location: [place] CATH LAB;  Service: Cardiovascular;  Laterality: N/A;   LEFT HEART CATHETERIZATION WITH CORONARY/GRAFT ANGIOGRAM N/A 02/08/2012   Procedure: LEFT HEART CATHETERIZATION WITH Beatrix Fetters;  Surgeon: [name], MD;  Location: [place] CATH LAB;  Service: Cardiovascular;  Laterality: N/A;   NM MYOCAR PERF WALL MOTION  03/23/2011   Low risk   PERCUTANEOUS CORONARY STENT INTERVENTION (PCI-S)  10/17/2011   Procedure: PERCUTANEOUS CORONARY STENT INTERVENTION (PCI-S);  Surgeon: [name], MD;  Location: [facility] CATH LAB;  Service: Cardiovascular;;   PPM GENERATOR CHANGEOUT N/A 02/20/2019   Procedure: PPM GENERATOR CHANGEOUT;  Surgeon: [name], MD;  Location: [place] CV LAB;  Service: Cardiovascular;  Laterality: N/A;   TEE WITHOUT CARDIOVERSION  04/23/2012   Procedure: TRANSESOPHAGEAL ECHOCARDIOGRAM (TEE);  Surgeon: Josue Hector, MD;  Location:  [facility] ENDOSCOPY;  Service: Cardiovascular;  Laterality: N/A;  spoke to pt about time change from 11a to 12p/DL   TEE WITHOUT CARDIOVERSION N/A 12/01/2012   Procedure: TRANSESOPHAGEAL ECHOCARDIOGRAM (TEE);  Surgeon: [name], MD;  Location: [facility] ENDOSCOPY;  Service: Cardiovascular;  Laterality: N/A;     Medications Prior to Admission: Prior to Admission medications   Medication Sig Start Date End Date Taking? Authorizing Provider  acetaminophen (TYLENOL) 500 MG tablet Take 1,000 mg by mouth every 6 (six) hours as needed for moderate pain.   Yes [provider]  aspirin 81 MG chewable tablet Chew 1 tablet (81 mg total) by mouth daily. 07/30/19  Yes Cheryln Manly, NP  clonazePAM (KLONOPIN) 0.5 MG tablet Take 1  tablet (0.5 mg total) by mouth 2 (two) times daily as needed for anxiety. 12/13/14  Yes Copland, Gay Filler, MD  clopidogrel (PLAVIX) 75 MG tablet Take 1 tablet by mouth once daily 01/04/20  Yes Croitoru, Mihai, MD  DILT-XR 180 MG 24 hr capsule Take 1 capsule by mouth once daily 05/05/19  Yes Croitoru, Mihai, MD  Doxylamine Succinate, Sleep, (SLEEP AID PO) Take 1 tablet by mouth at bedtime as needed (sleep).   Yes [provider]  fish oil-omega-3 fatty acids 1000 MG capsule Take 2 g by mouth daily.    Yes [provider]  isosorbide mononitrate (IMDUR) 60 MG 24 hr tablet Take 1 tablet (60 mg total) by mouth 2 (two) times daily. 10/16/19  Yes Croitoru, Mihai, MD  lisinopril (ZESTRIL) 20 MG tablet Take 1 tablet (20 mg total) by mouth daily. 04/16/19  Yes Croitoru, Mihai, MD  metFORMIN (GLUCOPHAGE) 500 MG tablet Take 1 tablet (500 mg total) by mouth 2 (two) times daily with a meal. 06/18/18  Yes Stallings, Zoe A, MD  metoprolol tartrate (LOPRESSOR) 100 MG tablet Take 100 mg by mouth 2 (two) times daily.   Yes [provider]  nitroGLYCERIN (NITROSTAT) 0.4 MG SL tablet Place 1 tablet (0.4 mg total) under the tongue every 5 (five) minutes as needed for up to 10 days for chest pain. 03/25/19 02/09/20 Yes Soto, Johana, PA-C  potassium chloride (K-DUR) 10 MEQ tablet TAKE 1  BY MOUTH ONCE DAILY Patient taking differently: Take 10 mEq by mouth daily.  04/13/19  Yes Croitoru, Mihai, MD  rosuvastatin (CRESTOR) 40 MG tablet TAKE 1 TABLET BY MOUTH ONCE DAILY Patient taking differently: Take 40 mg by mouth daily.  11/01/17  Yes Jeffery, [name], PA  venlafaxine XR (EFFEXOR-XR) 150 MG 24 hr capsule TAKE ONE CAPSULE BY MOUTH ONCE DAILY WITH  THE  75  MG  TO  TOTAL  225  MG  DAILY Patient taking differently: Take 150 mg by mouth daily.  07/12/17  Yes Jeffery, [name], PA  venlafaxine XR (EFFEXOR-XR) 75 MG 24 hr capsule TAKE ONE CAPSULE BY MOUTH ONCE DAILY TAKE  WITH  150MG   TO  EQUAL  A  TOTAL  OF  225MG    DAILY Patient taking differently: Take 75 mg by mouth at bedtime.  07/12/17  Yes Jeffery, [name], PA  warfarin (COUMADIN) 5 MG tablet TAKE 1/2 TO 1 (ONE-HALF TO ONE) TABLET BY MOUTH ONCE DAILY AS  DIRECTED  BY  COUMADIN  CLINIC Patient taking differently: Take 2.5-5 mg by mouth daily at 4 PM. TAKE 1/2 TO 1 (ONE-HALF TO ONE) TABLET BY MOUTH ONCE DAILY AS  DIRECTED  BY  COUMADIN  CLINIC 10/26/19  Yes Croitoru, Dani Gobble, MD     Allergies:   No  Known Allergies  Social History:   Social History   Socioeconomic History   Marital status: Married    Spouse name: Morey Hummingbird   Number of children: 2   Years of education: 10   Highest education level: 10th grade  Occupational History   Occupation: disability-heart disease    Comment: Investment banker, corporate  Tobacco Use   Smoking status: Former Smoker    Years: 1.00    Types: Cigarettes    Quit date: 12/18/1963    Years since quitting: 56.1   Smokeless tobacco: Former Systems developer    Quit date: 10/03/2012   Tobacco comment: quit smoking in 1970's  Substance and Sexual Activity   Alcohol use: Yes    Alcohol/week: 1.0 standard drinks    Types: 1 Cans of beer per week    Comment: rare   Drug use: Yes    Types: Marijuana    Comment: sometimes (down from every day)   Sexual activity: Yes  Other Topics Concern   Not on file  Social History Narrative   Pt lives in [place] with spouse.     2 grown daughters.     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   Caffeine intake daily    Social Determinants of Health   Financial Resource Strain:    Difficulty of Paying Living Expenses:   Food Insecurity:    Worried About Charity fundraiser in the Last Year:    Arboriculturist in the Last Year:   Transportation Needs:    Film/video editor (Medical):    Lack of Transportation (Non-Medical):   Physical Activity:    Days of Exercise per Week:    Minutes of Exercise per Session:   Stress:    Feeling of Stress :     Social Connections:    Frequency of Communication with Friends and Family:    Frequency of Social Gatherings with Friends and Family:    Attends Religious Services:    Active Member of Clubs or Organizations:    Attends Music therapist:    Marital Status:   Intimate Partner Violence:    Fear of Current or Ex-Partner:    Emotionally Abused:    Physically Abused:    Sexually Abused:     Family History:   The patient's family history includes Alzheimer's disease in his father; Colon cancer in his mother; Coronary artery disease in an other family member; Diabetes in his brother; Heart attack in his brother; Heart disease in his brother and father; Hypertension in his brother, brother, and sister; Stroke in his brother. There is no history of Stomach cancer or Pancreatic cancer.    ROS:  Please see the history of present illness.  All other ROS reviewed and negative.     Physical Exam/Data:   Vitals:   02/09/20 1100 02/09/20 1115 02/09/20 1230 02/09/20 1245  BP: 135/84 (!) 133/95 135/86 (!) 146/74  Pulse: (!) 59 66 61 64  Resp: 15 11 11 13   Temp:      TempSrc:      SpO2: 98% 96% 95% 98%   No intake or output data in the 24 hours ending 02/09/20 1345 Last 3 Weights 10/16/2019 08/10/2019 07/28/2019  Weight (lbs) 220 lb 222 lb 9.6 oz 225 lb  Weight (kg) 99.791 kg 100.971 kg 102.059 kg     There is no height or weight on file to calculate BMI.   General: Well developed, well nourished, NA Neck:  Negative for carotid bruits. No JVD Lungs:Clear to ausculation bilaterally. No wheezes, rales, or rhonchi. Breathing is unlabored. Cardiovascular: Irregularly irregular with S1 S2. No murmurs Abdomen: Soft, non-tender, non-distended. No obvious abdominal masses. Extremities: No edema. Radial pulses 2+ bilaterally Neuro: Alert and oriented. No focal deficits. No facial asymmetry. MAE spontaneously. Psych: Responds to questions appropriately with normal affect.      EKG:  The ECG that was done 02/09/20 was personally reviewed and demonstrates atrial fibrillation with rate control and V pacing, no acute changes   Relevant CV Studies:  Cath: 07/28/19   Dist LM to Ost LAD lesion is 100% stenosed. LIMA to LAD is patent.  Ost 1st Diag lesion is 100% stenosed. SVG to diag is patent.  Previously placed Dist Cx drug eluting stent is widely patent.  Ost Ramus lesion is 100% stenosed. SVG to ramus is patent.  Previously placed Prox Graft drug eluting stent is widely patent.  Ost LM to Dist LM lesion is 10% stenosed.  Ost RCA to Prox RCA lesion is 100% stenosed. SVG to RCA is occluded. Left to right collaterals.  3rd Mrg lesion is 95% stenosed.  A drug-eluting stent was successfully placed using a STENT RESOLUTE ONYX 2.5X26.  Post intervention, there is a 0% residual stenosis.  Ost Cx to Prox Cx lesion is 90% stenosed.  A drug-eluting stent was successfully placed using a STENT RESOLUTE ONYX 3.5X30.  Post intervention, there is a 0% residual stenosis.  LV end diastolic pressure is mildly elevated.  There is no aortic valve stenosis.  Continue aggressive secondary prevention. Continue DAPT for at least 1 year and clopidogrel for longer given his extensive disease.   Diagnostic Dominance: Co-dominant  Intervention    _____________  Echo 07/28/19: 1. Left ventricular ejection fraction, by visual estimation, is 45%. The left ventricle has mildly decreased function. There is no left ventricular hypertrophy. Inferolateral and anterolateral walls appear hypokinetic. 2. Left ventricular diastolic parameters are indeterminate. 3. Global right ventricle has mildly reduced systolic function.The right ventricular size is normal. No increase in right ventricular wall thickness. 4. A pacer wire is visualized in the RV. 5. Left atrial size was moderately dilated. 6. Right atrial size was mildly dilated. 7. Mild mitral annular  calcification. 8. The mitral valve is normal in structure. Mild mitral valve regurgitation. No evidence of mitral stenosis. 9. The tricuspid valve is normal in structure. Tricuspid valve regurgitation is trivial. 10. The aortic valve is tricuspid. Aortic valve regurgitation is trivial. Mild aortic valve sclerosis without stenosis. 11. The inferior vena cava is normal in size with greater than 50% respiratory variability, suggesting right atrial pressure of 3 mmHg. 12. TR signal is inadequate for assessing pulmonary artery systolic pressure.  Laboratory Data:  High Sensitivity Troponin:   Recent Labs  Lab 02/09/20 0855 02/09/20 1134  TROPONINIHS 22* 23*      Chemistry Recent Labs  Lab 02/09/20 0855  NA 138  K 4.2  CL 102  CO2 24  GLUCOSE 246*  BUN 15  CREATININE 0.95  CALCIUM 9.6  GFRNONAA >60  GFRAA >60  ANIONGAP 12    No results for input(s): PROT, ALBUMIN, AST, ALT, ALKPHOS, BILITOT in the last 168 hours. Hematology Recent Labs  Lab 02/09/20 0855  WBC 7.4  RBC 4.98  HGB 14.2  HCT 42.9  MCV 86.1  MCH 28.5  MCHC 33.1  RDW 14.0  PLT 203   BNPNo results for input(s): BNP, PROBNP in the last 168 hours.  DDimer No  results for input(s): DDIMER in the last 168 hours.   Radiology/Studies:  DG Chest 2 View  Result Date: 02/09/2020 CLINICAL DATA:  Chest pain since 6 a.m. this morning. History of CABG surgery. EXAM: CHEST - 2 VIEW COMPARISON:  07/27/2019 FINDINGS: Cardiac silhouette is normal in size. Changes from CABG surgery are stable. Left coronary artery stent, also stable. No mediastinal or hilar masses. No evidence of adenopathy. Left anterior chest wall sequential pacemaker is stable and well positioned. Clear lungs.  No pleural effusion or pneumothorax. Skeletal structures are intact. IMPRESSION: No acute cardiopulmonary disease. Electronically Signed   By: Lajean Manes M.D.   On: 02/09/2020 09:05       TIMI Risk Score for Unstable Angina or Non-ST  Elevation MI:   The patient's TIMI risk score is  , which indicates a  % risk of all cause mortality, new or recurrent myocardial infarction or need for urgent revascularization in the next 14 days.   Assessment and Plan:   1.  Unstable angina/NSTEMI with known CAD s/p CABG (2010) and recent PCI x4: -Presented to [facility] with recurrent anginal symptoms unrelieved with SL NTG -Has a long complicated cardiac history including CABG x 4 (2010) with DES to left main and Cx 2013, DES to SVG-D and DES to distal LCx 03/2019. Most recent [facility] 07/28/19 for unstable angina revealed patent LIMA-LAD, SVG-D1 patent, SVG-ramus patent, SVR-RCA occluded with left-right collaterals, DES placed to 3rd marginal which was 95% stenosed. DES placed to ostial-proximal Cx which was 90% stenosed>> initially placed on triple therapy for 30 days with ASA, Plavix and Coumadin however ASA was discontinued to 2021.  Unfortunately had recurrent anginal episodes at last follow-up with Dr. Sallyanne Kuster 09/2019 patient had episodes of angina at which time he was placed back on ASA therapy (triple therapy with ASA, Plavix and Coumadin) given his ongoing symptoms. -hsT, 22>>> 23 -EKG with AF, V-pacing and no acute changes  -CXR with no cardiopulmonary concern  -INR only 1.7 today>>>therefore can proceed with cath  -Given extensive cardiac history only options are for medical management versus repeat cardiac catheterization for full coronary assessment.  Will discuss plan with Dr. Martinique however likely will pursue LHC for further assessment.   2.  Persistent/permanent atrial fibrillation: -Heart rates controlled with diltiazem, metoprolol -Anticoagulated with Coumadin -Last INR, 1.7 today>> follows with our Coumadin clinic -No signs/symptoms of acute bleeding -Hemoglobin stable at 14.2 -CHA2DS2VASc =2 (HTN, CAD)  3. HLD:  -Last LDL, 27 -Continue high dose statin  4. Tachy-Brady syndrome s/p PPM:  -Pacemaker interrogated at last OV with  Dr. Sallyanne Kuster showed dual-chamber device, now programmed VVIR, has an estimated longevity of 13 years, with 78% ventricular pacing (he is on both metoprolol and diltiazem as antianginal medications in addition to being rate control medications).  The heart rate histogram looks a little blunted.  There have been no episodes of RVR -Gen change 02/2019  5.  DM2: -SSI for glucose control inpatient status -Last hemoglobin A1c, 7.2  6.  Neurally mediated syncope: -Has had recurrent episodes of syncope>> subsided with PPM placement   Severity of Illness: The appropriate patient status for this patient is OBSERVATION. Observation status is judged to be reasonable and necessary in order to provide the required intensity of service to ensure the patient's safety. The patient's presenting symptoms, physical exam findings, and initial radiographic and laboratory data in the context of their medical condition is felt to place them at decreased risk for further clinical deterioration. Furthermore, it  is anticipated that the patient will be medically stable for discharge from the hospital within 2 midnights of admission. The following factors support the patient status of observation.   " The patient's presenting symptoms include chest pain . " The physical exam findings include chest pain . " The initial radiographic and laboratory data are elevated troponin.    For questions or updates, please contact [place] Please consult [email or web address] for contact info under        Signed, Kathyrn Drown, NP  02/09/2020 1:45 PM

## 2020-02-10 ENCOUNTER — Observation Stay (HOSPITAL_BASED_OUTPATIENT_CLINIC_OR_DEPARTMENT_OTHER): Payer: BC Managed Care – PPO

## 2020-02-10 DIAGNOSIS — I10 Essential (primary) hypertension: Secondary | ICD-10-CM

## 2020-02-10 DIAGNOSIS — I4821 Permanent atrial fibrillation: Secondary | ICD-10-CM | POA: Diagnosis not present

## 2020-02-10 DIAGNOSIS — E785 Hyperlipidemia, unspecified: Secondary | ICD-10-CM | POA: Diagnosis not present

## 2020-02-10 DIAGNOSIS — Z955 Presence of coronary angioplasty implant and graft: Secondary | ICD-10-CM | POA: Diagnosis not present

## 2020-02-10 DIAGNOSIS — Z20822 Contact with and (suspected) exposure to covid-19: Secondary | ICD-10-CM | POA: Diagnosis not present

## 2020-02-10 DIAGNOSIS — I340 Nonrheumatic mitral (valve) insufficiency: Secondary | ICD-10-CM | POA: Diagnosis not present

## 2020-02-10 DIAGNOSIS — I2584 Coronary atherosclerosis due to calcified coronary lesion: Secondary | ICD-10-CM | POA: Diagnosis not present

## 2020-02-10 DIAGNOSIS — I25810 Atherosclerosis of coronary artery bypass graft(s) without angina pectoris: Secondary | ICD-10-CM | POA: Diagnosis not present

## 2020-02-10 DIAGNOSIS — R072 Precordial pain: Secondary | ICD-10-CM

## 2020-02-10 DIAGNOSIS — E119 Type 2 diabetes mellitus without complications: Secondary | ICD-10-CM | POA: Diagnosis not present

## 2020-02-10 LAB — PROTIME-INR
INR: 1.6 — ABNORMAL HIGH (ref 0.8–1.2)
Prothrombin Time: 18.7 seconds — ABNORMAL HIGH (ref 11.4–15.2)

## 2020-02-10 LAB — HEPATIC FUNCTION PANEL
ALT: 21 U/L (ref 0–44)
AST: 25 U/L (ref 15–41)
Albumin: 3.6 g/dL (ref 3.5–5.0)
Alkaline Phosphatase: 63 U/L (ref 38–126)
Bilirubin, Direct: 0.1 mg/dL (ref 0.0–0.2)
Indirect Bilirubin: 0.7 mg/dL (ref 0.3–0.9)
Total Bilirubin: 0.8 mg/dL (ref 0.3–1.2)
Total Protein: 6.4 g/dL — ABNORMAL LOW (ref 6.5–8.1)

## 2020-02-10 LAB — CBC
HCT: 41.1 % (ref 39.0–52.0)
Hemoglobin: 13.5 g/dL (ref 13.0–17.0)
MCH: 28.4 pg (ref 26.0–34.0)
MCHC: 32.8 g/dL (ref 30.0–36.0)
MCV: 86.3 fL (ref 80.0–100.0)
Platelets: 167 10*3/uL (ref 150–400)
RBC: 4.76 MIL/uL (ref 4.22–5.81)
RDW: 13.9 % (ref 11.5–15.5)
WBC: 6.5 10*3/uL (ref 4.0–10.5)
nRBC: 0 % (ref 0.0–0.2)

## 2020-02-10 LAB — ECHOCARDIOGRAM COMPLETE: Weight: 3548.8 oz

## 2020-02-10 LAB — LIPID PANEL
Cholesterol: 179 mg/dL (ref 0–200)
HDL: 27 mg/dL — ABNORMAL LOW (ref >40)
LDL Cholesterol: 76 mg/dL (ref 0–99)
Total CHOL/HDL Ratio: 6.6 RATIO
Triglycerides: 378 mg/dL — ABNORMAL HIGH (ref <150)
VLDL: 76 mg/dL — ABNORMAL HIGH (ref 0–40)

## 2020-02-10 LAB — GLUCOSE, CAPILLARY
Glucose-Capillary: 163 mg/dL — ABNORMAL HIGH (ref 70–99)
Glucose-Capillary: 166 mg/dL — ABNORMAL HIGH (ref 70–99)

## 2020-02-10 LAB — BASIC METABOLIC PANEL
Anion gap: 12 (ref 5–15)
BUN: 13 mg/dL (ref 8–23)
CO2: 23 mmol/L (ref 22–32)
Calcium: 9 mg/dL (ref 8.9–10.3)
Chloride: 102 mmol/L (ref 98–111)
Creatinine, Ser: 0.75 mg/dL (ref 0.61–1.24)
GFR calc Af Amer: >60 mL/min (ref >60)
GFR calc non Af Amer: >60 mL/min (ref >60)
Glucose, Bld: 147 mg/dL — ABNORMAL HIGH (ref 70–99)
Potassium: 3.8 mmol/L (ref 3.5–5.1)
Sodium: 137 mmol/L (ref 135–145)

## 2020-02-10 MED ORDER — PERFLUTREN LIPID MICROSPHERE
1.0000 mL | INTRAVENOUS | Status: AC | PRN
  Administered 2020-02-10: 3 mL via INTRAVENOUS
  Filled 2020-02-10: qty 10

## 2020-02-10 MED ORDER — PANTOPRAZOLE SODIUM 40 MG PO TBEC: 40.0000 mg | DELAYED_RELEASE_TABLET | Freq: Every day | ORAL | 1 refills | Status: DC

## 2020-02-10 MED ORDER — ISOSORBIDE MONONITRATE ER 60 MG PO TB24
90.0000 mg | ORAL_TABLET | Freq: Two times a day (BID) | ORAL | Status: DC
  Administered 2020-02-10: 90 mg via ORAL
  Filled 2020-02-10: qty 1

## 2020-02-10 MED ORDER — ISOSORBIDE MONONITRATE ER 30 MG PO TB24: 90.0000 mg | ORAL_TABLET | Freq: Two times a day (BID) | ORAL | 1 refills | Status: DC

## 2020-02-10 MED ORDER — PANTOPRAZOLE SODIUM 40 MG PO TBEC
40.0000 mg | DELAYED_RELEASE_TABLET | Freq: Every day | ORAL | Status: DC
  Administered 2020-02-10: 40 mg via ORAL
  Filled 2020-02-10 (×2): qty 1

## 2020-02-10 MED ORDER — MORPHINE SULFATE (PF) 2 MG/ML IV SOLN
2.0000 mg | Freq: Once | INTRAVENOUS | Status: AC
  Administered 2020-02-10: 2 mg via INTRAVENOUS
  Filled 2020-02-10: qty 1

## 2020-02-10 MED ORDER — ISOSORBIDE MONONITRATE ER 30 MG PO TB24
30.0000 mg | ORAL_TABLET | Freq: Once | ORAL | Status: AC
  Administered 2020-02-10: 30 mg via ORAL
  Filled 2020-02-10: qty 1

## 2020-02-10 NOTE — Progress Notes (Signed)
Pt. C/o CP 6/10 on Nitro drip. Rate increased per orders. Pt. Placed on progressive monitor. On call for Cardiology paged to make aware. Charge RN aware.

## 2020-02-10 NOTE — Progress Notes (Signed)
[facility] for Warfarin Indication: atrial fibrillation  No Known Allergies  Patient Measurements: Weight: 100.6 kg (221 lb 12.8 oz)(scale a)  Vital Signs: Temp: 97.4 F (36.3 C) (05/26 0500) Temp Source: Oral (05/26 0500) BP: 156/97 (05/26 0336) Pulse Rate: 71 (05/26 0500)  Labs: Recent Labs    02/09/20 0855 02/09/20 1134 02/10/20 0621  HGB 14.2  --  13.5  HCT 42.9  --  41.1  PLT 203  --  167  LABPROT  --  19.1* 18.7*  INR  --  1.7* 1.6*  CREATININE 0.95  --  0.75  TROPONINIHS 22* 23*  --     Estimated Creatinine Clearance: 112.3 mL/min (by C-G formula based on SCr of 0.75 mg/dL).   Medical History: Past Medical History:  Diagnosis Date  . Anxiety   . Atrial fibrillation (HCC)    paroxysmal  . Atrial flutter ([place])   . Coagulopathy ([place])    persistent elevation of ACT during 10/17/11 hospitalization  . Coronary artery disease    s/p CABG 2010, s/p Successful PCI  10/17/11 of Distal Left Main and the Left Circumflex with a Promus Element DES stent -- 4.0 mm x 16 mm postdilated to 4.12 mm   . DDD (degenerative disc disease)   . Depression   . Dysrhythmia    afib  . Facial asymmetry 2010   pt. stated it was very temporary, never seen by doctor for it.   Marland Kitchen GERD (gastroesophageal reflux disease)   . Heart murmur   . Hyperlipidemia   . Hypertension   . Kidney stones   . Presence of permanent cardiac pacemaker    Medtronic   . S/P coronary artery stent placement to distal Lt. main and Lt. Cx 10/17/11  . 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 Patient takes warfarin outpatient, dosed by the coumadin clinic. Pharmacy consulted to resume warfarin for atrial fibrillation.   Home warfarin regimen: 5 mg on Monday and Friday and 2.5 mg all other days.  Patient has [place] planned for today, warfarin to be  resumed afterwards.  Goal of Therapy:  INR 2-3 Monitor platelets by anticoagulation protocol: Yes   Plan:  - Warfarin 5 mg PO x1 today - Daily INR -If goes home would recommend 5mg  warfarin today then resume home regimen  Hildred Laser, PharmD Clinical Pharmacist **Pharmacist phone directory can now be found on [email or web address] ([email or web address]).  Listed under [place]   Please check AMION for all [facility] phone numbers After 10:00 PM, call the [facility] [phone]

## 2020-02-10 NOTE — Progress Notes (Signed)
Progress Note  Patient Name: Lucas Porter Date of Encounter: 02/10/2020  Primary Cardiologist: [name], MD   Subjective   Patient states he had steady constant chest pain last night. Unrelieved with IV Ntg. Is better this am.  Inpatient Medications    Scheduled Meds: . aspirin  81 mg Oral Daily  . clopidogrel  75 mg Oral Daily  . diltiazem  180 mg Oral Daily  . heparin  5,000 Units Subcutaneous Q8H  . insulin aspart  0-15 Units Subcutaneous TID WC  . isosorbide mononitrate  90 mg Oral BID  . lisinopril  20 mg Oral Daily  . metoprolol tartrate  100 mg Oral BID  . pantoprazole  40 mg Oral Daily  . rosuvastatin  40 mg Oral Daily  . sodium chloride flush  3 mL Intravenous Q12H  . sodium chloride flush  3 mL Intravenous Q12H  . warfarin  5 mg Oral Once  . Warfarin - Pharmacist Dosing Inpatient   Does not apply q1600   Continuous Infusions: . sodium chloride     PRN Meds: sodium chloride, acetaminophen, ondansetron (ZOFRAN) IV, perflutren lipid microspheres (DEFINITY) IV suspension, sodium chloride flush   Vital Signs    Vitals:   02/10/20 0200 02/10/20 0242 02/10/20 0336 02/10/20 0500  BP: (!) 142/94  (!) 156/97   Pulse:    71  Resp: 13 19 11    Temp:    (!) 97.4 F (36.3 C)  TempSrc:    Oral  SpO2:    96%  Weight:        Intake/Output Summary (Last 24 hours) at 02/10/2020 [id number] Last data filed at 02/10/2020 0700 Gross per 24 hour  Intake 385.11 ml  Output 700 ml  Net -314.89 ml   Last 3 Weights 02/10/2020 02/09/2020 10/16/2019  Weight (lbs) 221 lb 12.8 oz 220 lb 220 lb  Weight (kg) 100.608 kg 99.791 kg 99.791 kg      Telemetry    Afib with controlled rate and intermittent pacing - Personally Reviewed  ECG    Afib with nonspecific ST-T changes. No change from baseline - Personally Reviewed  Physical Exam   GEN: No acute distress.   Neck: No JVD Cardiac: IRRR, no murmurs, rubs, or gallops.  Respiratory: Clear to auscultation bilaterally. GI:  Soft, nontender, non-distended  MS: No edema; No deformity. Right groin without hematoma. Neuro:  Nonfocal  Psych: Normal affect   Labs    High Sensitivity Troponin:   Recent Labs  Lab 02/09/20 0855 02/09/20 1134  TROPONINIHS 22* 23*      Chemistry Recent Labs  Lab 02/09/20 0855 02/10/20 0621  NA 138 137  K 4.2 3.8  CL 102 102  CO2 24 23  GLUCOSE 246* 147*  BUN 15 13  CREATININE 0.95 0.75  CALCIUM 9.6 9.0  GFRNONAA >60 >60  GFRAA >60 >60  ANIONGAP 12 12     Hematology Recent Labs  Lab 02/09/20 0855 02/10/20 0621  WBC 7.4 6.5  RBC 4.98 4.76  HGB 14.2 13.5  HCT 42.9 41.1  MCV 86.1 86.3  MCH 28.5 28.4  MCHC 33.1 32.8  RDW 14.0 13.9  PLT 203 167    BNPNo results for input(s): BNP, PROBNP in the last 168 hours.   DDimer No results for input(s): DDIMER in the last 168 hours.   Radiology    DG Chest 2 View  Result Date: 02/09/2020 CLINICAL DATA:  Chest pain since 6 a.m. this morning. History of CABG surgery. EXAM:  CHEST - 2 VIEW COMPARISON:  07/27/2019 FINDINGS: Cardiac silhouette is normal in size. Changes from CABG surgery are stable. Left coronary artery stent, also stable. No mediastinal or hilar masses. No evidence of adenopathy. Left anterior chest wall sequential pacemaker is stable and well positioned. Clear lungs.  No pleural effusion or pneumothorax. Skeletal structures are intact. IMPRESSION: No acute cardiopulmonary disease. Electronically Signed   By: Lajean Manes M.D.   On: 02/09/2020 09:05   CARDIAC CATHETERIZATION  Result Date: 02/09/2020  Dist LM to Ost LAD lesion is 100% stenosed.  Ost LM to Dist LM lesion is 10% stenosed.  Ost 1st Diag lesion is 100% stenosed.  Ost 2nd Mrg lesion is 90% stenosed.  Non-stenotic Dist Cx lesion was previously treated.  Previously placed 3rd Mrg drug eluting stent is widely patent.  Previously placed Ost Cx to Prox Cx drug eluting stent is widely patent.  Balloon angioplasty was performed.  Ost Ramus  lesion is 100% stenosed.  LIMA and is large.  SVG.  Prox Graft lesion is 40% stenosed.  SVG and is normal in caliber.  Non-stenotic Prox Graft lesion was previously treated.  Ost RCA to Prox RCA lesion is 100% stenosed.  Origin lesion is 100% stenosed.  1. Severe native vessel CAD with mild in-stent restenosis of the left main/left circumflex stent, continued patency of the stented segment in the OM branch of the circumflex 2. Chronic RCA occlusion with chronic occlusion of the SVG-RCA 3. Continued patency of the SVG-ramus intermedius, SVG-diagonal, and LIMA-LAD 4. Normal LVEDP Recommend: continued medical therapy. Ok to resume warfarin tonight. Continue clopidogrel long term    Cardiac Studies   Procedures  LEFT HEART CATH AND CORS/GRAFTS ANGIOGRAPHY  Conclusion    Dist LM to Ost LAD lesion is 100% stenosed.  Ost LM to Dist LM lesion is 10% stenosed.  Ost 1st Diag lesion is 100% stenosed.  Ost 2nd Mrg lesion is 90% stenosed.  Non-stenotic Dist Cx lesion was previously treated.  Previously placed 3rd Mrg drug eluting stent is widely patent.  Previously placed Ost Cx to Prox Cx drug eluting stent is widely patent.  Balloon angioplasty was performed.  Ost Ramus lesion is 100% stenosed.  LIMA and is large.  SVG.  Prox Graft lesion is 40% stenosed.  SVG and is normal in caliber.  Non-stenotic Prox Graft lesion was previously treated.  Ost RCA to Prox RCA lesion is 100% stenosed.  Origin lesion is 100% stenosed.   1. Severe native vessel CAD with mild in-stent restenosis of the left main/left circumflex stent, continued patency of the stented segment in the OM branch of the circumflex 2. Chronic RCA occlusion with chronic occlusion of the SVG-RCA 3. Continued patency of the SVG-ramus intermedius, SVG-diagonal, and LIMA-LAD 4. Normal LVEDP  Recommend: continued medical therapy. Ok to resume warfarin tonight. Continue clopidogrel long term    Echo  pending  Patient Profile     64 y.o. male presents with symptoms of chest pain. He has a hx of persistent atrial fibrillation with hx of redo ablation and failed dofetilide, on coumadin, HTN, SSS s/p PPM with gent change 2020, DM, HLD, and GERD. Extensive CAD includes: CABG x 4 (2010) with DES to left main and Cx 2013, DES to SVG-D and DES to distal LCx 03/2019. Most recent [facility] 07/28/19 for unstable angina revealed patent LIMA-LAD, SVG-D1 patent, SVG-ramus patent, SVR-RCA occluded with left-right collaterals, DES placed to 3rd marginal which was 95% stenosed. DES placed to ostial-proximal Cx which was  90% stenosed.   Assessment & Plan    1.  Chest pain with extensive cardiac history and multiple PCIs.  -Presented to [facility] with recurrent anginal symptoms unrelieved with SL NTG. Symptoms occurring primarily at rest.  -Has a long complicated cardiac history including CABG x 4 (2010) with DES to left main and Cx 2013, DES to SVG-D and DES to distal LCx 03/2019. Most recent [facility] 07/28/19 for unstable angina revealed patent LIMA-LAD, SVG-D1 patent, SVG-ramus patent, SVR-RCA occluded with left-right collaterals, DES placed to 3rd marginal which was 95% stenosed. DES placed to ostial-proximal Cx which was 90% stenosed>> initially placed on triple therapy for 30 days with ASA, Plavix and Coumadin however ASA was discontinued to 2021.   -hsT, 22>>> 23- not indicative of ACS -EKG with AF, V-pacing and no acute changes  -CXR with no cardiopulmonary concern  -underwent cardiac cath yesterday with continued patency of stents.  - findings suggest that pain is not cardiac. Will add Protonix for GERD. DC IV nitrates. Check LFTs. Follow up on Echo report. Ambulate in halls today. If stable may be able to DC later today.  2.  Persistent/permanent atrial fibrillation: -Heart rates controlled with diltiazem, metoprolol -Anticoagulated with Coumadin -Last INR, 1.6 today>> coumadin resumed. Does not need bridging.  -No  signs/symptoms of acute bleeding -Hemoglobin stable at 14.2 -CHA2DS2VASc =2 (HTN, CAD)  3. HLD:  -Last LDL, 27 -Continue high dose statin  4. Tachy-Brady syndrome s/p PPM:  -Pacemaker interrogated at last OV with Dr. Sallyanne Kuster showed dual-chamber device, now programmed VVIR, has an estimated longevity of 13 years, with 78% ventricular pacing (he is on both metoprolol and diltiazem as antianginal medications in addition to being rate control medications). The heart rate histogram looks a little blunted. There have been no episodes of RVR -Gen change 02/2019  5.  DM2: -SSI for glucose control inpatient status -Last hemoglobin A1c, 7.2  6.  Neurally mediated syncope: -Has had recurrent episodes of syncope>> subsided with PPM placement  For questions or updates, please contact [place] Please consult [email or web address] for contact info under        Signed, Cheila Wickstrom [place], MD  02/10/2020, 9:22 AM

## 2020-02-10 NOTE — Discharge Summary (Signed)
Discharge Summary    Patient ID: Lucas Porter,  MRN: [id number], DOB/AGE: 02-01-1956 64 y.o.  Admit date: 02/09/2020 Discharge date: 02/10/2020  Primary Care Provider: Patient, No Pcp Per Primary Cardiologist: Dr. Sallyanne Kuster  Discharge Diagnoses    Principal Problem:   Unstable angina [facility]) Active Problems:   Hypertension   Dyslipidemia   Long term (current) use of anticoagulants   Diabetes mellitus type 2, uncomplicated ([place])   Pacemaker   Permanent atrial fibrillation ([place])   Allergies No Known Allergies  Diagnostic Studies/Procedures   Cath: 02/09/20    Dist LM to Ost LAD lesion is 100% stenosed.  Ost LM to Dist LM lesion is 10% stenosed.  Ost 1st Diag lesion is 100% stenosed.  Ost 2nd Mrg lesion is 90% stenosed.  Non-stenotic Dist Cx lesion was previously treated.  Previously placed 3rd Mrg drug eluting stent is widely patent.  Previously placed Ost Cx to Prox Cx drug eluting stent is widely patent.  Balloon angioplasty was performed.  Ost Ramus lesion is 100% stenosed.  LIMA and is large.  SVG.  Prox Graft lesion is 40% stenosed.  SVG and is normal in caliber.  Non-stenotic Prox Graft lesion was previously treated.  Ost RCA to Prox RCA lesion is 100% stenosed.  Origin lesion is 100% stenosed.  1. Severe native vessel CAD with mild in-stent restenosis of the left main/left circumflex stent, continued patency of the stented segment in the OM branch of the circumflex 2. Chronic RCA occlusion with chronic occlusion of the SVG-RCA 3. Continued patency of the SVG-ramus intermedius, SVG-diagonal, and LIMA-LAD 4. Normal LVEDP  Recommend: continued medical therapy. Ok to resume warfarin tonight. Continue clopidogrel long term   Echo: 02/10/20  IMPRESSIONS    1. Left ventricular ejection fraction, by estimation, is 55 to 60%. The  left ventricle has normal function. The left ventricle has no regional  wall motion abnormalities. Left  ventricular diastolic function could not  be evaluated.  2. Right ventricular systolic function is low normal. The right  ventricular size is normal. There is normal pulmonary artery systolic  pressure. The estimated right ventricular systolic pressure is Q000111Q mmHg.  3. Left atrial size was mildly dilated.  4. The mitral valve is degenerative. Mild mitral valve regurgitation. No  evidence of mitral stenosis.  5. The aortic valve is tricuspid. Aortic valve regurgitation is not  visualized. No aortic stenosis is present.  6. The inferior vena cava is normal in size with greater than 50%  respiratory variability, suggesting right atrial pressure of 3 mmHg.   Comparison(s): Changes from prior study are noted. EF 55-60%. No WMA.  _____________   History of Present Illness     Lucas Porter is a 64 year old male with a history of redo ablation, and failed dofetilide, on coumadin, HTN, SSS s/p PPM with gen changes 2020, DM, HL and GERD who presented to [facility] on 02/09/20 with chest pain which began approximately 0630 am. Pain initially described as sharp, anterior chest pain which subsided to a pressure/tightness across his whole anterior chest. He had no associated SOB, N/V or diaphoresis. He took 2 SL NTG tablets as well as ASA 324mg  with some, but minimal relief. Continued to have mild chest pressure on exam. He reportd having an episode several weeks ago at which time he took SL nitro with resolution. On chart review, he was having angina at last OV with Dr. Sallyanne Kuster at which time his isosorbide was up-titrated to 60mg  and his ASA  was restarted (now on triple antiplatelet/anticoagulation therapy) with plans for close follow up. Given given his cardiac hx and recent PCI, he called EMS for further evaluation.    On EMS arrival he was given an additional NTG tablet with pain reduction from 4/10 to 0/10. In the ED, hsT found to be 22>>23 (hsT noted to be minimally elevated during recent hospitalization  with PCI placement 07/2019 39>>42). EKG with AF, V paced at 64bpm and no acute changes from prior tracings. CXR with no acute cardiopulmonary disease.   Lucas Porter has a complex cardiac history with the most recent hospital admission 07/2019 at which time he underwent LHC for unstable angina which revealed patent LIMA-LAD, SVG-D1 patent, SVG-ramus patent, SVR-RCA occluded with left-right collaterals, DES placed to 3rd marginal which was 95% stenosed. DES placed to ostial-proximal Cx which was 90% stenosed.  Plan at that time was to pursue triple therapy with ASA/Plavix and Coumadin for 1 month after intervention then discontinue ASA.  He was seen in follow-up 08/10/2019 at which time he had several twinges of chest pain and intermittent DOE but was otherwise stable.  Hospital Course     Consultants: None   1.Chest pain with extensive cardiac history and multiple PCIs: Presented to [facility] with recurrent anginal symptoms unrelieved with SL NTG. Symptoms occurring primarily at rest.  -Has a long complicated cardiac history includingCABG x 4 (2010) with DES to left main and Cx 2013, DES to SVG-D and DES to distal LCx 03/2019.Most recent LHC11/10/20 for unstable anginarevealed patent LIMA-LAD, SVG-D1 patent, SVG-ramus patent, SVR-RCA occluded with left-right collaterals, DES placed to 3rd marginal which was 95% stenosed. DES placed to ostial-proximal Cx which was 90% stenosed>>initially placed on triple therapy for 30 days with ASA, Plavix and Coumadin however ASA was discontinued to 2021.  -hsT,22>>>23- not indicative of ACS -EKGon admission showed AF, V-pacing and no acute changes  -CXR with no cardiopulmonary concern  -underwent cardiac cath with continued patency of stents.  - findings suggest that pain is not cardiac. Plan to add Protonix for GERD. Echo with normal EF.  - able to ambulate without difficulty.  2. Persistent/permanent atrial fibrillation: -Heart rates controlled with  diltiazem, metoprolol -Anticoagulated with Coumadin -Last INR,1.6 on day of discharge. No plans for bridging. Plan for 5mg  the evening of discharge and then to resume home dosing.  -No signs/symptoms of acute bleeding -Hemoglobinstable at 14.2 -CHA2DS2VASc =2 (HTN, CAD)  3. HLD: -Last LDL, 27 -Continuehigh dose statin  4. Tachy-Bradysyndromes/p PPM: -Pacemaker interrogated at last OV with Dr. Kelton Pillar device, now programmed VVIR, has an estimated longevity of 13 years, with 78% ventricular pacing (he is on both metoprolol and diltiazem as antianginal medications in addition to being rate control medications).There have been no episodes of RVR -Gen change 02/2019  5. DM2: -SSI for glucose control inpatient status, resumed on home regimen at discharge. -Last hemoglobin A1c, 7.2  6. Neurally mediated syncope: -Has had recurrent episodes of syncope>>subsided with PPM placement  _____________  Discharge Vitals Blood pressure (!) 156/97, pulse 71, temperature (!) 97.4 F (36.3 C), temperature source Oral, resp. rate 11, weight 100.6 kg, SpO2 96 %.  Filed Weights   02/09/20 1430 02/10/20 0022  Weight: 99.8 kg 100.6 kg    Labs & Radiologic Studies    CBC Recent Labs    02/09/20 0855 02/10/20 0621  WBC 7.4 6.5  HGB 14.2 13.5  HCT 42.9 41.1  MCV 86.1 86.3  PLT 203 [id number]   Basic Metabolic Panel Recent  Labs    02/09/20 0855 02/10/20 0621  NA 138 137  K 4.2 3.8  CL 102 102  CO2 24 23  GLUCOSE 246* 147*  BUN 15 13  CREATININE 0.95 0.75  CALCIUM 9.6 9.0   Liver Function Tests Recent Labs    02/10/20 0942  AST 25  ALT 21  ALKPHOS 63  BILITOT 0.8  PROT 6.4*  ALBUMIN 3.6   No results for input(s): LIPASE, AMYLASE in the last 72 hours. Cardiac Enzymes No results for input(s): CKTOTAL, CKMB, CKMBINDEX, TROPONINI in the last 72 hours. BNP Invalid input(s): POCBNP D-Dimer No results for input(s): DDIMER in the last 72  hours. Hemoglobin A1C Recent Labs    02/09/20 1355  HGBA1C 7.9*   Fasting Lipid Panel Recent Labs    02/10/20 0621  CHOL 179  HDL 27*  LDLCALC 76  TRIG 378*  CHOLHDL 6.6   Thyroid Function Tests No results for input(s): TSH, T4TOTAL, T3FREE, THYROIDAB in the last 72 hours.  Invalid input(s): FREET3 _____________  DG Chest 2 View  Result Date: 02/09/2020 CLINICAL DATA:  Chest pain since 6 a.m. this morning. History of CABG surgery. EXAM: CHEST - 2 VIEW COMPARISON:  07/27/2019 FINDINGS: Cardiac silhouette is normal in size. Changes from CABG surgery are stable. Left coronary artery stent, also stable. No mediastinal or hilar masses. No evidence of adenopathy. Left anterior chest wall sequential pacemaker is stable and well positioned. Clear lungs.  No pleural effusion or pneumothorax. Skeletal structures are intact. IMPRESSION: No acute cardiopulmonary disease. Electronically Signed   By: Lajean Manes M.D.   On: 02/09/2020 09:05   CARDIAC CATHETERIZATION  Result Date: 02/09/2020  Dist LM to Ost LAD lesion is 100% stenosed.  Ost LM to Dist LM lesion is 10% stenosed.  Ost 1st Diag lesion is 100% stenosed.  Ost 2nd Mrg lesion is 90% stenosed.  Non-stenotic Dist Cx lesion was previously treated.  Previously placed 3rd Mrg drug eluting stent is widely patent.  Previously placed Ost Cx to Prox Cx drug eluting stent is widely patent.  Balloon angioplasty was performed.  Ost Ramus lesion is 100% stenosed.  LIMA and is large.  SVG.  Prox Graft lesion is 40% stenosed.  SVG and is normal in caliber.  Non-stenotic Prox Graft lesion was previously treated.  Ost RCA to Prox RCA lesion is 100% stenosed.  Origin lesion is 100% stenosed.  1. Severe native vessel CAD with mild in-stent restenosis of the left main/left circumflex stent, continued patency of the stented segment in the OM branch of the circumflex 2. Chronic RCA occlusion with chronic occlusion of the SVG-RCA 3. Continued  patency of the SVG-ramus intermedius, SVG-diagonal, and LIMA-LAD 4. Normal LVEDP Recommend: continued medical therapy. Ok to resume warfarin tonight. Continue clopidogrel long term   ECHOCARDIOGRAM COMPLETE  Result Date: 02/10/2020    ECHOCARDIOGRAM REPORT   Patient Name:   Lucas Porter Date of Exam: 02/10/2020 Medical Rec #:  [id number]        Height:       70.0 in Accession #:    [id number]       Weight:       221.8 lb Date of Birth:  May 12, 1956         BSA:          2.181 m Patient Age:    65 years         BP:           156/97 mmHg Patient Gender:  M                HR:           80 bpm. Exam Location:  Inpatient Procedure: 2D Echo, Cardiac Doppler, Color Doppler and Intracardiac            Opacification Agent Indications:    Chest Pain 786.50 / R07.9  History:        Patient has prior history of Echocardiogram examinations, most                 recent 07/28/2019. CAD, Pacemaker and Prior CABG,                 Arrythmias:Atrial Fibrillation and Atrial Flutter,                 Signs/Symptoms:Chest Pain; Risk Factors:Former Smoker.  Sonographer:    Vickie Epley RDCS Referring Phys: [place]  1. Left ventricular ejection fraction, by estimation, is 55 to 60%. The left ventricle has normal function. The left ventricle has no regional wall motion abnormalities. Left ventricular diastolic function could not be evaluated.  2. Right ventricular systolic function is low normal. The right ventricular size is normal. There is normal pulmonary artery systolic pressure. The estimated right ventricular systolic pressure is Q000111Q mmHg.  3. Left atrial size was mildly dilated.  4. The mitral valve is degenerative. Mild mitral valve regurgitation. No evidence of mitral stenosis.  5. The aortic valve is tricuspid. Aortic valve regurgitation is not visualized. No aortic stenosis is present.  6. The inferior vena cava is normal in size with greater than 50% respiratory variability, suggesting right atrial  pressure of 3 mmHg. Comparison(s): Changes from prior study are noted. EF 55-60%. No WMA. FINDINGS  Left Ventricle: Left ventricular ejection fraction, by estimation, is 55 to 60%. The left ventricle has normal function. The left ventricle has no regional wall motion abnormalities. Definity contrast agent was given IV to delineate the left ventricular  endocardial borders. The left ventricular internal cavity size was normal in size. There is no left ventricular hypertrophy. Abnormal (paradoxical) septal motion consistent with post-operative status. Left ventricular diastolic function could not be evaluated due to atrial fibrillation. Left ventricular diastolic function could not be evaluated. Right Ventricle: The right ventricular size is normal. No increase in right ventricular wall thickness. Right ventricular systolic function is low normal. There is normal pulmonary artery systolic pressure. The tricuspid regurgitant velocity is 2.21 m/s,  and with an assumed right atrial pressure of 3 mmHg, the estimated right ventricular systolic pressure is Q000111Q mmHg. Left Atrium: Left atrial size was mildly dilated. Right Atrium: Right atrial size was normal in size. Pericardium: Trivial pericardial effusion is present. Presence of pericardial fat pad. Mitral Valve: The mitral valve is degenerative in appearance. Mild mitral annular calcification. Mild mitral valve regurgitation. No evidence of mitral valve stenosis. Tricuspid Valve: The tricuspid valve is grossly normal. Tricuspid valve regurgitation is trivial. No evidence of tricuspid stenosis. Aortic Valve: The aortic valve is tricuspid. Aortic valve regurgitation is not visualized. No aortic stenosis is present. Pulmonic Valve: The pulmonic valve was grossly normal. Pulmonic valve regurgitation is trivial. No evidence of pulmonic stenosis. Aorta: The aortic root is normal in size and structure. Venous: The inferior vena cava is normal in size with greater than 50%  respiratory variability, suggesting right atrial pressure of 3 mmHg. IAS/Shunts: The atrial septum is grossly normal. Additional Comments: A is visualized in the right  atrium and right ventricle.  LEFT VENTRICLE PLAX 2D LVIDd:         5.38 cm LVIDs:         4.13 cm LV PW:         1.01 cm LV IVS:        1.00 cm LVOT diam:     2.10 cm LV SV:         64 LV SV Index:   30 LVOT Area:     3.46 cm  LV Volumes (MOD) LV vol d, MOD A2C: 97.5 ml LV vol d, MOD A4C: 99.8 ml LV vol s, MOD A2C: 56.9 ml LV vol s, MOD A4C: 50.2 ml LV SV MOD A2C:     40.6 ml LV SV MOD A4C:     99.8 ml LV SV MOD BP:      45.5 ml RIGHT VENTRICLE TAPSE (M-mode): 1.5 cm LEFT ATRIUM             Index       RIGHT ATRIUM           Index LA diam:        5.00 cm 2.29 cm/m  RA Area:     12.60 cm LA Vol (A2C):   49.4 ml 22.65 ml/m RA Volume:   29.90 ml  13.71 ml/m LA Vol (A4C):   81.4 ml 37.32 ml/m LA Biplane Vol: 68.3 ml 31.31 ml/m  AORTIC VALVE LVOT Vmax:   85.90 cm/s LVOT Vmean:  60.400 cm/s LVOT VTI:    0.186 m  AORTA Ao Root diam: 3.20 cm TRICUSPID VALVE TR Peak grad:   19.5 mmHg TR Vmax:        221.00 cm/s  SHUNTS Systemic VTI:  0.19 m Systemic Diam: 2.10 cm Eleonore Chiquito MD Electronically signed by Eleonore Chiquito MD Signature Date/Time: 02/10/2020/11:46:23 AM    Final    Disposition   Pt is being discharged home today in good condition.  Follow-up Plans & Appointments    Follow-up Information    Abigail Butts., PA-C. Go on 02/22/2020.   Specialty: Physician Assistant Why: @9 :15am for hospital follow up  Contact information: [address] [place] [address] [phone]        [facility] Follow up.   Specialty: Cardiology Why: Please have your coumadin levels checked next week Contact information: [address] [place] [phone]         Discharge Instructions    Diet - low sodium heart healthy   Complete by: As directed    Discharge  instructions   Complete by: As directed    Groin Site Care Refer to this sheet in the next few weeks. These instructions provide you with information on caring for yourself after your procedure. Your caregiver may also give you more specific instructions. Your treatment has been planned according to current medical practices, but problems sometimes occur. Call your caregiver if you have any problems or questions after your procedure. HOME CARE INSTRUCTIONS You may shower 24 hours after the procedure. Remove the bandage (dressing) and gently wash the site with plain soap and water. Gently pat the site dry.  Do not apply powder or lotion to the site.  Do not sit in a bathtub, swimming pool, or whirlpool for 5 to 7 days.  No bending, squatting, or lifting anything over 10 pounds (4.5 kg) as directed by your caregiver.  Inspect the site at least twice daily.  Do not drive home if you are discharged the same day of the procedure. Have someone else drive you.  You may drive 24 hours after the procedure unless otherwise instructed by your caregiver.  What to expect: Any bruising will usually fade within 1 to 2 weeks.  Blood that collects in the tissue (hematoma) may be painful to the touch. It should usually decrease in size and tenderness within 1 to 2 weeks.  SEEK IMMEDIATE MEDICAL CARE IF: You have unusual pain at the groin site or down the affected leg.  You have redness, warmth, swelling, or pain at the groin site.  You have drainage (other than a small amount of blood on the dressing).  You have chills.  You have a fever or persistent symptoms for more than 72 hours.  You have a fever and your symptoms suddenly get worse.  Your leg becomes pale, cool, tingly, or numb.  You have heavy bleeding from the site. Hold pressure on the site. .   Increase activity slowly   Complete by: As directed       Discharge Medications   Allergies as of 02/10/2020   No Known Allergies     Medication  List    TAKE these medications   acetaminophen 500 MG tablet Commonly known as: TYLENOL Take 1,000 mg by mouth every 6 (six) hours as needed for moderate pain.   aspirin 81 MG chewable tablet Chew 1 tablet (81 mg total) by mouth daily.   clonazePAM 0.5 MG tablet Commonly known as: KLONOPIN Take 1 tablet (0.5 mg total) by mouth 2 (two) times daily as needed for anxiety.   clopidogrel 75 MG tablet Commonly known as: PLAVIX Take 1 tablet by mouth once daily   Dilt-XR 180 MG 24 hr capsule Generic drug: diltiazem Take 1 capsule by mouth once daily   fish oil-omega-3 fatty acids 1000 MG capsule Take 2 g by mouth daily.   isosorbide mononitrate 30 MG 24 hr tablet Commonly known as: IMDUR Take 3 tablets (90 mg total) by mouth 2 (two) times daily. What changed:   medication strength  how much to take   lisinopril 20 MG tablet Commonly known as: ZESTRIL Take 1 tablet (20 mg total) by mouth daily.   metFORMIN 500 MG tablet Commonly known as: GLUCOPHAGE Take 1 tablet (500 mg total) by mouth 2 (two) times daily with a meal.   metoprolol tartrate 100 MG tablet Commonly known as: LOPRESSOR Take 100 mg by mouth 2 (two) times daily.   nitroGLYCERIN 0.4 MG SL tablet Commonly known as: NITROSTAT Place 1 tablet (0.4 mg total) under the tongue every 5 (five) minutes as needed for up to 10 days for chest pain.   pantoprazole 40 MG tablet Commonly known as: PROTONIX Take 1 tablet (40 mg total) by mouth daily.   potassium chloride 10 MEQ tablet Commonly known as: KLOR-CON TAKE 1  BY MOUTH ONCE DAILY What changed: See the new instructions.   rosuvastatin 40 MG tablet Commonly known as: CRESTOR TAKE 1 TABLET BY MOUTH ONCE DAILY   SLEEP AID PO Take 1 tablet by mouth at bedtime as needed (sleep).   venlafaxine XR 75 MG 24 hr capsule Commonly known as: EFFEXOR-XR TAKE ONE CAPSULE BY MOUTH ONCE DAILY TAKE  WITH  150MG   TO  EQUAL  A  TOTAL  OF  225MG   DAILY What changed: See the  new instructions.   venlafaxine XR 150 MG 24 hr capsule Commonly known as: EFFEXOR-XR TAKE ONE  CAPSULE BY MOUTH ONCE DAILY WITH  THE  75  MG  TO  TOTAL  225  MG  DAILY What changed:   how much to take  how to take this  when to take this  additional instructions   warfarin 5 MG tablet Commonly known as: COUMADIN Take as directed. If you are unsure how to take this medication, talk to your nurse or doctor. Original instructions: TAKE 1/2 TO 1 (ONE-HALF TO ONE) TABLET BY MOUTH ONCE DAILY AS  DIRECTED  BY  COUMADIN  CLINIC What changed: See the new instructions. Notes to patient: Please take 5mg  today and then resume your normal dosing.         Outstanding Labs/Studies   INR check next week  Duration of Discharge Encounter   Greater than 30 minutes including physician time.  Signed, Reino Bellis NP-C 02/10/2020, 12:44 PM

## 2020-02-10 NOTE — Care Management Obs Status (Signed)
[place] NOTIFICATION   Patient Details  Name: Lucas Porter MRN: [id number] Date of Birth: 1955/12/28   Medicare Observation Status Notification Given:  Yes    Zenon Mayo, RN 02/10/2020, 1:47 PM

## 2020-02-10 NOTE — Progress Notes (Signed)
  Echocardiogram 2D Echocardiogram has been performed.  Geoffery Lyons Swaim 02/10/2020, 9:01 AM

## 2020-02-22 ENCOUNTER — Ambulatory Visit: Payer: BC Managed Care – PPO | Admitting: Medical

## 2020-02-23 ENCOUNTER — Ambulatory Visit (INDEPENDENT_AMBULATORY_CARE_PROVIDER_SITE_OTHER): Payer: BC Managed Care – PPO | Admitting: *Deleted

## 2020-02-23 DIAGNOSIS — I4821 Permanent atrial fibrillation: Secondary | ICD-10-CM | POA: Diagnosis not present

## 2020-02-23 LAB — CUP PACEART REMOTE DEVICE CHECK
Battery Remaining Longevity: 152 mo
Battery Voltage: 3.05 V
Brady Statistic AP VP Percent: 0 %
Brady Statistic AP VS Percent: 0 %
Brady Statistic AS VP Percent: 45.01 %
Brady Statistic AS VS Percent: 54.99 %
Brady Statistic RA Percent Paced: 0 %
Brady Statistic RV Percent Paced: 45.01 %
Date Time Interrogation Session: 20210608014222
Implantable Lead Implant Date: 20120229
Implantable Lead Implant Date: 20120229
Implantable Lead Location: 753859
Implantable Lead Location: 753860
Implantable Pulse Generator Implant Date: 20200605
Lead Channel Impedance Value: 342 Ohm
Lead Channel Impedance Value: 475 Ohm
Lead Channel Impedance Value: 532 Ohm
Lead Channel Impedance Value: 551 Ohm
Lead Channel Pacing Threshold Amplitude: 0.75 V
Lead Channel Pacing Threshold Amplitude: 0.75 V
Lead Channel Pacing Threshold Pulse Width: 0.4 ms
Lead Channel Pacing Threshold Pulse Width: 0.4 ms
Lead Channel Sensing Intrinsic Amplitude: 0.5 mV
Lead Channel Sensing Intrinsic Amplitude: 0.5 mV
Lead Channel Sensing Intrinsic Amplitude: 16 mV
Lead Channel Sensing Intrinsic Amplitude: 16 mV
Lead Channel Setting Pacing Amplitude: 2.5 V
Lead Channel Setting Pacing Pulse Width: 0.4 ms
Lead Channel Setting Sensing Sensitivity: 2 mV

## 2020-02-24 NOTE — Progress Notes (Signed)
Remote pacemaker transmission.   

## 2020-03-03 NOTE — Progress Notes (Signed)
Cardiology Office Note   Date:  03/07/2020   ID:  Lucas Porter, DOB 1956/07/08, MRN [id number]  PCP:  Karin Golden  Cardiologist: Dr. Sallyanne Kuster     History of Present Illness: Lucas Porter is a 64 y.o. male who presents for posthospitalization follow-up after admission for recurrent chest pain, with NSTEMI and atrial fibrillation..  He has a history of CAD with coronary artery bypass grafting (LIMA to LAD, SVG to diagonal 1, SVG to ramus, SBR to RCA).  With ongoing complex cardiac history, multiple PCI's, DES to the third  marginal, DES to ostial proximal circumflex.  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  Followed by Dr. Sallyanne Kuster as well.  He was seen in the ER due to unstable angina after being brought in by EMS and having multiple nitroglycerin sublingual tablets provided.  He underwent cardiac catheterization on 02/09/2019.  This revealed severe native vessel CAD with mild in-stent restenosis of the left main/left circumflex stent, continued patency of the stented segment in the OM branch of the circumflex.  Chronic RCA occlusion with chronic occlusion of the SVG to RCA, continued patency of the SVG ramus intermedius SVG diagonal, and LIMA to LAD, with normal LVEDP.  The patient was continued on current medication regimen with addition of PPI for GERD symptoms.  Echocardiogram was completed which revealed normal LV function.  He was to continue Coumadin for atrial fibrillation CHADS VASC Score of 2.    He comes today without any complaints.  He has had relief of his symptoms with PPI.  His wife keeps up with his medications and he is compliant.  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  He has not had any recurrent discomfort since returning home from the hospital and going back to his normal activities.  Past Medical History:  Diagnosis Date  . Anxiety   . Atrial  fibrillation (HCC)    paroxysmal  . Atrial flutter ([place])   . Coagulopathy ([place])    persistent elevation of ACT during 10/17/11 hospitalization  . Coronary artery disease    s/p CABG 2010, s/p Successful PCI  10/17/11 of Distal Left Main and the Left Circumflex with a Promus Element DES stent -- 4.0 mm x 16 mm postdilated to 4.12 mm   . DDD (degenerative disc disease)   . Depression   . Dysrhythmia    afib  . Facial asymmetry 2010   pt. stated it was very temporary, never seen by doctor for it.   Marland Kitchen GERD (gastroesophageal reflux disease)   . Heart murmur   . Hyperlipidemia   . Hypertension   . Kidney stones   . Presence of permanent cardiac pacemaker    Medtronic   . S/P coronary artery stent placement to distal Lt. main and Lt. Cx 10/17/11  . 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 ATRIAL FIBRILLATION ABLATION  04/24/12, 12/02/12   PVI x2 and CTI ablation by Dr Rayann Heman  . ATRIAL FIBRILLATION ABLATION N/A 04/24/2012   Procedure: ATRIAL FIBRILLATION ABLATION;  Surgeon: Thompson Grayer, MD;  Location: [facility] CATH LAB;  Service: Cardiovascular;  Laterality: N/A;  . ATRIAL FIBRILLATION ABLATION N/A 12/02/2012   Procedure: ATRIAL FIBRILLATION ABLATION;  Surgeon: Thompson Grayer, MD;  Location: [facility] CATH LAB;  Service: Cardiovascular;  Laterality:  N/A;  . CORONARY ANGIOPLASTY  10/19/11  . CORONARY ARTERY BYPASS GRAFT  2010  . CORONARY STENT INTERVENTION N/A 04/13/2019   Procedure: CORONARY STENT INTERVENTION;  Surgeon: Nelva Bush, MD;  Location: [place] CV LAB;  Service: Cardiovascular;  Laterality: N/A;  . CORONARY STENT INTERVENTION N/A 07/28/2019   Procedure: CORONARY STENT INTERVENTION;  Surgeon: Jettie Booze, MD;  Location: [place] CV LAB;  Service: Cardiovascular;  Laterality: N/A;  . CYSTOSCOPY  2007 ?   treatment for Kidney stones   . INGUINAL HERNIA REPAIR Left 01/04/2016   Procedure:  LAPAROSCOPIC LEFT INGUINAL HERNIA WITH MESH;  Surgeon: Ralene Ok, MD;  Location: [place];  Service: General;  Laterality: Left;  . INSERT / REPLACE / REMOVE PACEMAKER  2/12   Medtronic  . INSERTION OF MESH Left 01/04/2016   Procedure: INSERTION OF MESH;  Surgeon: Ralene Ok, MD;  Location: [place];  Service: General;  Laterality: Left;  . LAPAROSCOPIC LYSIS OF ADHESIONS Left 01/04/2016   Procedure: LAPAROSCOPIC LYSIS OF ADHESIONS;  Surgeon: Ralene Ok, MD;  Location: [place];  Service: General;  Laterality: Left;  . LEFT HEART CATH AND CORS/GRAFTS ANGIOGRAPHY N/A 02/20/2019   Procedure: LEFT HEART CATH AND CORS/GRAFTS ANGIOGRAPHY;  Surgeon: Belva Crome, MD;  Location: [place] CV LAB;  Service: Cardiovascular;  Laterality: N/A;  . LEFT HEART CATH AND CORS/GRAFTS ANGIOGRAPHY N/A 04/13/2019   Procedure: LEFT HEART CATH AND CORS/GRAFTS ANGIOGRAPHY;  Surgeon: Nelva Bush, MD;  Location: [place] CV LAB;  Service: Cardiovascular;  Laterality: N/A;  . LEFT HEART CATH AND CORS/GRAFTS ANGIOGRAPHY N/A 07/28/2019   Procedure: LEFT HEART CATH AND CORS/GRAFTS ANGIOGRAPHY;  Surgeon: Jettie Booze, MD;  Location: [place] CV LAB;  Service: Cardiovascular;  Laterality: N/A;  . LEFT HEART CATH AND CORS/GRAFTS ANGIOGRAPHY N/A 02/09/2020   Procedure: LEFT HEART CATH AND CORS/GRAFTS ANGIOGRAPHY;  Surgeon: Sherren Mocha, MD;  Location: [place] CV LAB;  Service: Cardiovascular;  Laterality: N/A;  . LEFT HEART CATHETERIZATION WITH CORONARY/GRAFT ANGIOGRAM N/A 10/17/2011   Procedure: LEFT HEART CATHETERIZATION WITH Beatrix Fetters;  Surgeon: [name], MD;  Location: [place] CATH LAB;  Service: Cardiovascular;  Laterality: N/A;  . LEFT HEART CATHETERIZATION WITH CORONARY/GRAFT ANGIOGRAM N/A 02/08/2012   Procedure: LEFT HEART CATHETERIZATION WITH Beatrix Fetters;  Surgeon: [name], MD;  Location: [place] CATH LAB;  Service: Cardiovascular;  Laterality: N/A;  . NM MYOCAR  PERF WALL MOTION  03/23/2011   Low risk  . PERCUTANEOUS CORONARY STENT INTERVENTION (PCI-S)  10/17/2011   Procedure: PERCUTANEOUS CORONARY STENT INTERVENTION (PCI-S);  Surgeon: [name], MD;  Location: [facility] CATH LAB;  Service: Cardiovascular;;  . PPM GENERATOR CHANGEOUT N/A 02/20/2019   Procedure: PPM GENERATOR CHANGEOUT;  Surgeon: [name], MD;  Location: [place] CV LAB;  Service: Cardiovascular;  Laterality: N/A;  . TEE WITHOUT CARDIOVERSION  04/23/2012   Procedure: TRANSESOPHAGEAL ECHOCARDIOGRAM (TEE);  Surgeon: Josue Hector, MD;  Location: [facility] ENDOSCOPY;  Service: Cardiovascular;  Laterality: N/A;  spoke to pt about time change from 11a to 12p/DL  . TEE WITHOUT CARDIOVERSION N/A 12/01/2012   Procedure: TRANSESOPHAGEAL ECHOCARDIOGRAM (TEE);  Surgeon: [name], MD;  Location: [facility] ENDOSCOPY;  Service: Cardiovascular;  Laterality: N/A;     Current Outpatient Medications  Medication Sig Dispense Refill  . acetaminophen (TYLENOL) 500 MG tablet Take 1,000 mg by mouth every 6 (six) hours as needed for moderate pain.    Marland Kitchen aspirin 81 MG chewable tablet Chew 1 tablet (81 mg total) by mouth  daily. 30 tablet 0  . clonazePAM (KLONOPIN) 0.5 MG tablet Take 1 tablet (0.5 mg total) by mouth 2 (two) times daily as needed for anxiety. 30 tablet 1  . clopidogrel (PLAVIX) 75 MG tablet Take 1 tablet by mouth once daily 90 tablet 2  . DILT-XR 180 MG 24 hr capsule Take 1 capsule by mouth once daily 90 capsule 3  . Doxylamine Succinate, Sleep, (SLEEP AID PO) Take 1 tablet by mouth at bedtime as needed (sleep).    . fish oil-omega-3 fatty acids 1000 MG capsule Take 2 g by mouth daily.     . isosorbide mononitrate (IMDUR) 30 MG 24 hr tablet Take 3 tablets (90 mg total) by mouth 2 (two) times daily. 60 tablet 1  . lisinopril (ZESTRIL) 20 MG tablet Take 1 tablet (20 mg total) by mouth daily. 90 tablet 3  . metFORMIN (GLUCOPHAGE) 500 MG tablet Take 1 tablet (500 mg total) by mouth 2 (two) times daily with  a meal. 60 tablet 0  . metoprolol tartrate (LOPRESSOR) 100 MG tablet Take 100 mg by mouth 2 (two) times daily.    . pantoprazole (PROTONIX) 40 MG tablet Take 1 tablet (40 mg total) by mouth daily. 30 tablet 1  . potassium chloride (K-DUR) 10 MEQ tablet TAKE 1  BY MOUTH ONCE DAILY (Patient taking differently: Take 10 mEq by mouth daily. ) 90 tablet 1  . rosuvastatin (CRESTOR) 40 MG tablet TAKE 1 TABLET BY MOUTH ONCE DAILY (Patient taking differently: Take 40 mg by mouth daily. ) 90 tablet 3  . venlafaxine XR (EFFEXOR-XR) 150 MG 24 hr capsule TAKE ONE CAPSULE BY MOUTH ONCE DAILY WITH  THE  75  MG  TO  TOTAL  225  MG  DAILY (Patient taking differently: Take 150 mg by mouth daily. ) 90 capsule 3  . venlafaxine XR (EFFEXOR-XR) 75 MG 24 hr capsule TAKE ONE CAPSULE BY MOUTH ONCE DAILY TAKE  WITH  150MG   TO  EQUAL  A  TOTAL  OF  225MG   DAILY (Patient taking differently: Take 75 mg by mouth at bedtime. ) 90 capsule 3  . warfarin (COUMADIN) 5 MG tablet TAKE 1/2 TO 1 (ONE-HALF TO ONE) TABLET BY MOUTH ONCE DAILY AS  DIRECTED  BY  COUMADIN  CLINIC (Patient taking differently: Take 2.5-5 mg by mouth daily at 4 PM. 5 mg (5 mg x 1) every Mon, Fri; 2.5 mg (5 mg x 0.5) all other days) 90 tablet 0  . nitroGLYCERIN (NITROSTAT) 0.4 MG SL tablet Place 1 tablet (0.4 mg total) under the tongue every 5 (five) minutes as needed for up to 10 days for chest pain. 10 tablet 0   No current facility-administered medications for this visit.    Allergies:   Patient has no known allergies.    Social History:  The patient  reports that he quit smoking about 56 years ago. His smoking use included cigarettes. He quit after 1.00 year of use. He quit smokeless tobacco use about 7 years ago. He reports current alcohol use of about 1.0 standard drink of alcohol per week. He reports current drug use. Drug: Marijuana.   Family History:  The patient's family history includes Alzheimer's disease in his father; Colon cancer in his mother;  Coronary artery disease in an other family member; Diabetes in his brother; Heart attack in his brother; Heart disease in his brother and father; Hypertension in his brother, brother, and sister; Stroke in his brother.    ROS: All other  systems are reviewed and negative. Unless otherwise mentioned in H&P    PHYSICAL EXAM: VS:  BP (!) 148/84   Pulse 79   Ht 5\' 10"  (1.778 m)   Wt 222 lb (100.7 kg)   SpO2 97%   BMI 31.85 kg/m  , BMI Body mass index is 31.85 kg/m. GEN: Well nourished, well developed, in no acute distress HEENT: normal Neck: no JVD, left carotid bruits, or masses Cardiac: RRR; no murmurs, rubs, or gallops,no edema  Respiratory:  Clear to auscultation bilaterally, normal work of breathing GI: soft, nontender, nondistended, + BS MS: no deformity or atrophy Skin: warm and dry, no rash Neuro:  Strength and sensation are intact Psych: euthymic mood, full affect   EKG: Not completed this office visit.    Recent Labs: 02/10/2020: ALT 21; BUN 13; Creatinine, Ser 0.75; Hemoglobin 13.5; Platelets 167; Potassium 3.8; Sodium 137    Lipid Panel    Component Value Date/Time   CHOL 179 02/10/2020 0621   CHOL 117 04/09/2019 1532   TRIG 378 (H) 02/10/2020 0621   HDL 27 (L) 02/10/2020 0621   HDL 40 04/09/2019 1532   CHOLHDL 6.6 02/10/2020 0621   VLDL 76 (H) 02/10/2020 0621   LDLCALC 76 02/10/2020 0621   LDLCALC 26 04/09/2019 1532      Wt Readings from Last 3 Encounters:  03/07/20 222 lb (100.7 kg)  02/10/20 221 lb 12.8 oz (100.6 kg)  10/16/19 220 lb (99.8 kg)      Other studies Reviewed: Cath: 02/09/20    Dist LM to Ost LAD lesion is 100% stenosed.  Ost LM to Dist LM lesion is 10% stenosed.  Ost 1st Diag lesion is 100% stenosed.  Ost 2nd Mrg lesion is 90% stenosed.  Non-stenotic Dist Cx lesion was previously treated.  Previously placed 3rd Mrg drug eluting stent is widely patent.  Previously placed Ost Cx to Prox Cx drug eluting stent is widely  patent.  Balloon angioplasty was performed.  Ost Ramus lesion is 100% stenosed.  LIMA and is large.  SVG.  Prox Graft lesion is 40% stenosed.  SVG and is normal in caliber.  Non-stenotic Prox Graft lesion was previously treated.  Ost RCA to Prox RCA lesion is 100% stenosed.  Origin lesion is 100% stenosed.  1. Severe native vessel CAD with mild in-stent restenosis of the left main/left circumflex stent, continued patency of the stented segment in the OM branch of the circumflex 2. Chronic RCA occlusion with chronic occlusion of the SVG-RCA 3. Continued patency of the SVG-ramus intermedius, SVG-diagonal, and LIMA-LAD 4. Normal LVEDP  Recommend: continued medical therapy. Ok to resume warfarin tonight. Continue clopidogrel long term   Echo: 02/10/20  IMPRESSIONS    1. Left ventricular ejection fraction, by estimation, is 55 to 60%. The  left ventricle has normal function. The left ventricle has no regional  wall motion abnormalities. Left ventricular diastolic function could not  be evaluated.  2. Right ventricular systolic function is low normal. The right  ventricular size is normal. There is normal pulmonary artery systolic  pressure. The estimated right ventricular systolic pressure is 60.6 mmHg.  3. Left atrial size was mildly dilated.  4. The mitral valve is degenerative. Mild mitral valve regurgitation. No  evidence of mitral stenosis.  5. The aortic valve is tricuspid. Aortic valve regurgitation is not  visualized. No aortic stenosis is present.  6. The inferior vena cava is normal in size with greater than 50%  respiratory variability, suggesting right atrial pressure  of 3 mmHg.   Comparison(s): Changes from prior study are noted. EF 55-60%. No WMA.  _____________    ASSESSMENT AND PLAN:  1.  Coronary artery disease: History of CABG, multiple PCI's.  Recent cardiac catheterization 02/09/2019 revealed severe native vessel CAD with continued patency  of OM branch of the circumflex and mild in-stent restenosis of the left main/left circumflex stent.  He has CTO of the RCA and chronic occlusion of SVG to RCA.  Other grafts were open.  His chest discomfort was felt to be related to GERD.  After using PPI the patient's symptoms have resolved and he has gone back to normal activities.  He will continue aspirin and Plavix as directed along with isosorbide mononitrate.  He continues to keep nitroglycerin with him at all times.  I advised him to make sure he has a fresh bottle with him every 3 months especially during the summer months.  He is advised to stay out of temperatures greater than 85 degrees as he likes to work outside, to avoid being dehydrated or overheated during the summer months.  2.  Left carotid artery bruit: Noted on today's exam.  I will check carotid artery ultrasounds to evaluate for stenosis especially in light of known CAD.  He will continue statin therapy as directed.  3.  Hyperlipidemia: Goal of LDL less than 70.  He will remain on rosuvastatin 40 mg daily.  Most recent LDL 76 on 02/10/2020.  4.  Hypertension: Blood pressure slightly elevated today.  We will monitor this closely.  For now keep him on diltiazem, metoprolol, and lisinopril.  He admitted to eating some salted foods over Father's Day weekend and states that his blood pressures normally in the 130s over 60s.  We will follow this.  He knows the goal of his blood pressure but CAD is less than 140/70.  5.  Non-insulin-dependent diabetes: Continue Metformin per primary care.  6.  Paroxysmal atrial fibrillation: He continues on metoprolol diltiazem and warfarin.  Warfarin managed by PCP.  Current medicines are reviewed at length with the patient today.  I have spent 30 minutes dedicated to the care of this patient on the date of this encounter to include pre-visit review of records, assessment, management and diagnostic testing,with shared decision making.  Labs/ tests  ordered today include: Carotid Doppler ultrasound bilateral  Lucas Porter. [place] Pugh, ANP, AACC   03/07/2020 4:16 PM    [facility] [place] [address] Office [phone] Fax [phone]  Notice: This dictation was prepared with Dragon dictation along with smaller phrase technology. Any transcriptional errors that result from this process are unintentional and may not be corrected upon review.

## 2020-03-04 ENCOUNTER — Ambulatory Visit (INDEPENDENT_AMBULATORY_CARE_PROVIDER_SITE_OTHER): Payer: BC Managed Care – PPO | Admitting: Pharmacist

## 2020-03-04 ENCOUNTER — Other Ambulatory Visit: Payer: Self-pay

## 2020-03-04 DIAGNOSIS — I480 Paroxysmal atrial fibrillation: Secondary | ICD-10-CM | POA: Diagnosis not present

## 2020-03-04 DIAGNOSIS — Z7901 Long term (current) use of anticoagulants: Secondary | ICD-10-CM | POA: Diagnosis not present

## 2020-03-04 LAB — POCT INR: INR: 2.3 (ref 2.0–3.0)

## 2020-03-07 ENCOUNTER — Other Ambulatory Visit: Payer: Self-pay

## 2020-03-07 ENCOUNTER — Ambulatory Visit (INDEPENDENT_AMBULATORY_CARE_PROVIDER_SITE_OTHER): Payer: BC Managed Care – PPO | Admitting: Adult Health

## 2020-03-07 ENCOUNTER — Encounter: Payer: Self-pay | Admitting: Adult Health

## 2020-03-07 VITALS — BP 148/84 | HR 79 | Ht 70.0 in | Wt 222.0 lb

## 2020-03-07 DIAGNOSIS — I10 Essential (primary) hypertension: Secondary | ICD-10-CM

## 2020-03-07 DIAGNOSIS — I2510 Atherosclerotic heart disease of native coronary artery without angina pectoris: Secondary | ICD-10-CM | POA: Diagnosis not present

## 2020-03-07 DIAGNOSIS — E782 Mixed hyperlipidemia: Secondary | ICD-10-CM

## 2020-03-07 DIAGNOSIS — R0989 Other specified symptoms and signs involving the circulatory and respiratory systems: Secondary | ICD-10-CM

## 2020-03-07 DIAGNOSIS — I480 Paroxysmal atrial fibrillation: Secondary | ICD-10-CM

## 2020-03-07 NOTE — Patient Instructions (Signed)
Medication Instructions:  NO CHANGE *If you need a refill on your cardiac medications before your next appointment, please call your pharmacy*   Lab Work: If you have labs (blood work) drawn today and your tests are completely normal, you will receive your results only by: Marland Kitchen MyChart Message (if you have MyChart) OR . A paper copy in the mail If you have any lab test that is abnormal or we need to change your treatment, we will call you to review the results.   Testing/Procedures: Your physician has requested that you have a carotid duplex. This test is an ultrasound of the carotid arteries in your neck. It looks at blood flow through these arteries that supply the brain with blood. Allow one hour for this exam. There are no restrictions or special instructions.[facility]   Follow-Up: At [facility], you and your health needs are our priority.  As part of our continuing mission to provide you with exceptional heart care, we have created designated Provider Care Teams.  These Care Teams include your primary Cardiologist (physician) and Advanced Practice Providers (APPs -  Physician Assistants and Nurse Practitioners) who all work together to provide you with the care you need, when you need it.  We recommend signing up for the patient portal called "MyChart".  Sign up information is provided on this After Visit Summary.  MyChart is used to connect with patients for Virtual Visits (Telemedicine).  Patients are able to view lab/test results, encounter notes, upcoming appointments, etc.  Non-urgent messages can be sent to your provider as well.   To learn more about what you can do with MyChart, go to [email or web address].    Your next appointment:   6 month(s)  The format for your next appointment:   In Person  Provider:   [name], MD

## 2020-03-08 ENCOUNTER — Other Ambulatory Visit: Payer: Self-pay | Admitting: Adult Health

## 2020-03-08 DIAGNOSIS — R0989 Other specified symptoms and signs involving the circulatory and respiratory systems: Secondary | ICD-10-CM

## 2020-03-08 NOTE — Progress Notes (Signed)
Thank you :)

## 2020-03-11 ENCOUNTER — Ambulatory Visit (HOSPITAL_COMMUNITY)
Admission: RE | Admit: 2020-03-11 | Discharge: 2020-03-11 | Disposition: A | Discharge status: Home / Self Care | Payer: BC Managed Care – PPO | Source: Ambulatory Visit | Attending: Cardiology | Admitting: Cardiology

## 2020-03-11 ENCOUNTER — Other Ambulatory Visit: Payer: Self-pay

## 2020-03-11 ENCOUNTER — Other Ambulatory Visit: Payer: Self-pay | Admitting: Adult Health

## 2020-03-11 DIAGNOSIS — I6521 Occlusion and stenosis of right carotid artery: Secondary | ICD-10-CM

## 2020-03-11 DIAGNOSIS — I771 Stricture of artery: Secondary | ICD-10-CM

## 2020-03-11 DIAGNOSIS — R0989 Other specified symptoms and signs involving the circulatory and respiratory systems: Secondary | ICD-10-CM | POA: Insufficient documentation

## 2020-03-19 ENCOUNTER — Other Ambulatory Visit: Payer: Self-pay | Admitting: Cardiovascular Disease

## 2020-03-31 ENCOUNTER — Encounter: Payer: Self-pay | Admitting: *Deleted

## 2020-04-06 ENCOUNTER — Other Ambulatory Visit: Payer: Self-pay

## 2020-04-06 MED ORDER — WARFARIN SODIUM 5 MG PO TABS: 2.5000 mg | ORAL_TABLET | Freq: Every day | ORAL | 0 refills | Status: DC

## 2020-04-09 ENCOUNTER — Other Ambulatory Visit: Payer: Self-pay | Admitting: Cardiology

## 2020-04-11 ENCOUNTER — Other Ambulatory Visit: Payer: Self-pay

## 2020-04-12 ENCOUNTER — Other Ambulatory Visit: Payer: Self-pay | Admitting: Pharmacist Clinician (PhC)/ Clinical Pharmacy Specialist

## 2020-04-12 MED ORDER — WARFARIN SODIUM 5 MG PO TABS: ORAL_TABLET | ORAL | 1 refills | Status: DC

## 2020-04-15 ENCOUNTER — Other Ambulatory Visit: Payer: Self-pay

## 2020-04-15 ENCOUNTER — Ambulatory Visit (INDEPENDENT_AMBULATORY_CARE_PROVIDER_SITE_OTHER): Payer: BC Managed Care – PPO | Admitting: Pharmacist

## 2020-04-15 DIAGNOSIS — I4892 Unspecified atrial flutter: Secondary | ICD-10-CM

## 2020-04-15 DIAGNOSIS — I480 Paroxysmal atrial fibrillation: Secondary | ICD-10-CM

## 2020-04-15 DIAGNOSIS — Z7901 Long term (current) use of anticoagulants: Secondary | ICD-10-CM

## 2020-04-15 LAB — POCT INR: INR: 1.2 — AB (ref 2.0–3.0)

## 2020-04-15 NOTE — Patient Instructions (Signed)
Take 2 tablets today 7/30 ONLY, then continue 1/2 tablet everyday except 1 tablet on Mondays and Fridays.  Repeat INR in 6 weeks  Call [phone] if for questions

## 2020-04-19 ENCOUNTER — Other Ambulatory Visit: Payer: Self-pay | Admitting: Cardiology

## 2020-05-02 ENCOUNTER — Other Ambulatory Visit: Payer: Self-pay | Admitting: Cardiovascular Disease

## 2020-05-06 ENCOUNTER — Other Ambulatory Visit: Payer: Self-pay | Admitting: Cardiovascular Disease

## 2020-05-24 ENCOUNTER — Telehealth: Payer: Self-pay | Admitting: Cardiovascular Disease

## 2020-05-24 ENCOUNTER — Ambulatory Visit (INDEPENDENT_AMBULATORY_CARE_PROVIDER_SITE_OTHER): Payer: BC Managed Care – PPO | Admitting: *Deleted

## 2020-05-24 DIAGNOSIS — I4821 Permanent atrial fibrillation: Secondary | ICD-10-CM | POA: Diagnosis not present

## 2020-05-24 LAB — CUP PACEART REMOTE DEVICE CHECK
Battery Remaining Longevity: 149 mo
Battery Voltage: 3.04 V
Brady Statistic AP VP Percent: 0 %
Brady Statistic AP VS Percent: 0 %
Brady Statistic AS VP Percent: 47.39 %
Brady Statistic AS VS Percent: 52.61 %
Brady Statistic RA Percent Paced: 0 %
Brady Statistic RV Percent Paced: 47.39 %
Date Time Interrogation Session: 20210907014340
Implantable Lead Implant Date: 20120229
Implantable Lead Implant Date: 20120229
Implantable Lead Location: 753859
Implantable Lead Location: 753860
Implantable Pulse Generator Implant Date: 20200605
Lead Channel Impedance Value: 342 Ohm
Lead Channel Impedance Value: 475 Ohm
Lead Channel Impedance Value: 532 Ohm
Lead Channel Impedance Value: 551 Ohm
Lead Channel Pacing Threshold Amplitude: 0.625 V
Lead Channel Pacing Threshold Amplitude: 0.75 V
Lead Channel Pacing Threshold Pulse Width: 0.4 ms
Lead Channel Pacing Threshold Pulse Width: 0.4 ms
Lead Channel Sensing Intrinsic Amplitude: 0.5 mV
Lead Channel Sensing Intrinsic Amplitude: 0.5 mV
Lead Channel Sensing Intrinsic Amplitude: 19.375 mV
Lead Channel Sensing Intrinsic Amplitude: 19.375 mV
Lead Channel Setting Pacing Amplitude: 2.5 V
Lead Channel Setting Pacing Pulse Width: 0.4 ms
Lead Channel Setting Sensing Sensitivity: 2 mV

## 2020-05-24 NOTE — Telephone Encounter (Signed)
I suspect that the physician in question may have thought that there was a duplicate error in the Imdur prescription and was trying to clean up the prescription medication list. The manufacturer does not make a 90 mg tablet, so we often prescribe it as 30 mg + 60 mg tabs  When he was discharged from the hospital after the cath in June the instructions were to take isosorbide mononitrate 90 mg twice daily. This is a relatively high dose. There is no exact dose of isosorbide that is exactly right for all patients at all times. The dose is adjusted for optimal symptom relief (angina relief). If he is not having angina now, then whatever amount he is taking is sufficient. If taking it only once daily works for him, continue that same dose and schedule.

## 2020-05-24 NOTE — Telephone Encounter (Signed)
Spoke to pt's wife who is questioning as to whether pt is to continue taking Imdur 90 mg BID and clarify if it's the correct dose. She report pt is scheduled for a hernia report and the note from surgeon in indicated to stop taking Imdur 30 mg. Wife state it did not specify as the reason for stopping medication but will call their office tomorrow.  Will route to MD for recommendations.

## 2020-05-24 NOTE — Telephone Encounter (Signed)
     Pt c/o medication issue:  1. Name of Medication:   isosorbide mononitrate (IMDUR) 30 MG 24 hr tablet    isosorbide mononitrate (IMDUR) 60 MG 24 hr tablet    2. How are you currently taking this medication (dosage and times per day)?   3. Are you having a reaction (difficulty breathing--STAT)?   4. What is your medication issue? Pt's wife calling, she would like to clarify if pt need to stop taking this medication

## 2020-05-25 ENCOUNTER — Telehealth: Payer: Self-pay | Admitting: Cardiovascular Disease

## 2020-05-25 NOTE — Telephone Encounter (Signed)
Patient with diagnosis of afib on warfarin for anticoagulation.    Procedure: inguinal hernia repair Date of procedure: TBD  CHADS2-VASc score of 3 (HTN, DM, CAD)  CrCl >177mL/min Platelet count 167K  Agree that pt can hold warfarin for 5 days prior to procedure.

## 2020-05-25 NOTE — Telephone Encounter (Signed)
April from [facility] calling for clearance she states the fax is: [phone]

## 2020-05-25 NOTE — Progress Notes (Signed)
Remote pacemaker transmission.   

## 2020-05-25 NOTE — Telephone Encounter (Signed)
OK to hold the plavix for 5-7 days before surgery. Restart postop. OK to stop the ASA permanently (do not restart when he goes back to clopidogrel and warfarin postop). OK to hold warfarin without "bridging".

## 2020-05-25 NOTE — Telephone Encounter (Signed)
The patient's wife has been made aware and verbalized her understanding. She stated that he has been taking the Isosorbide 90 mg bid.

## 2020-05-25 NOTE — Telephone Encounter (Signed)
Left message to call back  Kerin Ransom PA-C 05/25/2020 1:03 PM

## 2020-05-25 NOTE — Telephone Encounter (Signed)
    [facility] Pre-operative Risk Assessment    Request for surgical clearance:  1. What type of surgery is being performed? Robotic Inguinal Hernia Repair  2. When is this surgery scheduled? TBD   3. What type of clearance is required (medical clearance vs. Pharmacy clearance to hold med vs. Both)? Both  4. Are there any medications that need to be held prior to surgery and how long? Plavix & Coumadin - Their office is inquiring about whether or not medications need to be held.   5. Practice name and name of physician performing surgery? [facility]  Dr. Tally Due   6. What is your office phone number? [phone]    7.   What is your office fax number? [phone] (attn: Mia)  8.   Anesthesia type (None, local, MAC, general) ? General   Zara Council 05/25/2020, 8:32 AM  _________________________________________________________________   (provider comments below)

## 2020-05-25 NOTE — Telephone Encounter (Signed)
Pharmacy please comment on anticoagulation pre op for this patient.  Dr Sallyanne Kuster can you comment on holding Plavix and aspirin pre-op.  Pt has a history of CABG and subsequent PCIs with DES.  Cathed in Nov 2020 and May 2021- medical Rx recommended- he apparently is on triple Rx with ASA, Coumadin, and Plavix.  Kerin Ransom PA-C 05/25/2020 10:27 AM

## 2020-05-26 NOTE — Telephone Encounter (Signed)
   Primary Cardiologist: [name], MD  Chart reviewed and patient contacted today by phone (I spoke with Ms Dorsi) as part of pre-operative protocol coverage. He has not had chest pain or SOB. Given past medical history and time since last visit, based on ACC/AHA guidelines, TAMAJ JURGENS would be at acceptable risk for the planned procedure without further cardiovascular testing.   OK to hold Plavix 5 days pre op. OK to hold Coumadin 5 days pre op (no need for Lovenox) The patient's aspirin has been discontinued as of today. Resume Plavix and Coumadin as soon as safe post op.   The patient was advised that if he develops new symptoms prior to surgery to contact our office to arrange for a follow-up visit, and he verbalized understanding.  I will route this recommendation to the requesting party via Epic fax function and remove from pre-op pool.  Please call with questions.  Kerin Ransom, PA-C 05/26/2020, 9:23 AM

## 2020-05-30 ENCOUNTER — Telehealth: Payer: Self-pay

## 2020-05-30 NOTE — Telephone Encounter (Signed)
lmom for missed inr 

## 2020-06-03 ENCOUNTER — Other Ambulatory Visit: Payer: Self-pay

## 2020-06-03 ENCOUNTER — Ambulatory Visit (INDEPENDENT_AMBULATORY_CARE_PROVIDER_SITE_OTHER): Payer: BC Managed Care – PPO

## 2020-06-03 DIAGNOSIS — I480 Paroxysmal atrial fibrillation: Secondary | ICD-10-CM | POA: Diagnosis not present

## 2020-06-03 DIAGNOSIS — Z7901 Long term (current) use of anticoagulants: Secondary | ICD-10-CM | POA: Diagnosis not present

## 2020-06-03 LAB — POCT INR: INR: 2.1 (ref 2.0–3.0)

## 2020-06-03 NOTE — Patient Instructions (Signed)
continue 1/2 tablet everyday except 1 tablet on Mondays and Fridays.  Repeat INR in 3 weeks; Holding Warfarin 10/1-10/5  Call [phone] if for questions

## 2020-06-11 ENCOUNTER — Other Ambulatory Visit: Payer: Self-pay | Admitting: Cardiovascular Disease

## 2020-06-24 ENCOUNTER — Other Ambulatory Visit: Payer: Self-pay | Admitting: Cardiovascular Disease

## 2020-07-01 ENCOUNTER — Other Ambulatory Visit: Payer: Self-pay

## 2020-07-01 ENCOUNTER — Ambulatory Visit (INDEPENDENT_AMBULATORY_CARE_PROVIDER_SITE_OTHER): Payer: BC Managed Care – PPO

## 2020-07-01 DIAGNOSIS — Z7901 Long term (current) use of anticoagulants: Secondary | ICD-10-CM

## 2020-07-01 DIAGNOSIS — I480 Paroxysmal atrial fibrillation: Secondary | ICD-10-CM

## 2020-07-01 LAB — POCT INR: INR: 1.6 — AB (ref 2.0–3.0)

## 2020-07-01 NOTE — Patient Instructions (Signed)
Take 2 tablets today and then continue 1/2 tablet everyday except 1 tablet on Mondays and Fridays.  Repeat INR in 4 weeks  Call [phone] if for questions

## 2020-07-29 ENCOUNTER — Ambulatory Visit (INDEPENDENT_AMBULATORY_CARE_PROVIDER_SITE_OTHER): Payer: BC Managed Care – PPO

## 2020-07-29 DIAGNOSIS — I480 Paroxysmal atrial fibrillation: Secondary | ICD-10-CM | POA: Diagnosis not present

## 2020-07-29 DIAGNOSIS — Z7901 Long term (current) use of anticoagulants: Secondary | ICD-10-CM | POA: Diagnosis not present

## 2020-07-29 LAB — POCT INR: INR: 2.4 (ref 2.0–3.0)

## 2020-07-29 NOTE — Patient Instructions (Signed)
continue 1/2 tablet everyday except 1 tablet on Mondays and Fridays.  Repeat INR in 5 weeks  Call [phone] if for questions

## 2020-08-23 ENCOUNTER — Ambulatory Visit (INDEPENDENT_AMBULATORY_CARE_PROVIDER_SITE_OTHER): Payer: BC Managed Care – PPO

## 2020-08-23 DIAGNOSIS — I4821 Permanent atrial fibrillation: Secondary | ICD-10-CM

## 2020-08-23 LAB — CUP PACEART REMOTE DEVICE CHECK
Battery Remaining Longevity: 147 mo
Battery Voltage: 3.04 V
Brady Statistic AP VP Percent: 0 %
Brady Statistic AP VS Percent: 0 %
Brady Statistic AS VP Percent: 36.1 %
Brady Statistic AS VS Percent: 63.9 %
Brady Statistic RA Percent Paced: 0 %
Brady Statistic RV Percent Paced: 36.1 %
Date Time Interrogation Session: 20211207004459
Implantable Lead Implant Date: 20120229
Implantable Lead Implant Date: 20120229
Implantable Lead Location: 753859
Implantable Lead Location: 753860
Implantable Pulse Generator Implant Date: 20200605
Lead Channel Impedance Value: 342 Ohm
Lead Channel Impedance Value: 494 Ohm
Lead Channel Impedance Value: 551 Ohm
Lead Channel Impedance Value: 551 Ohm
Lead Channel Pacing Threshold Amplitude: 0.625 V
Lead Channel Pacing Threshold Amplitude: 0.75 V
Lead Channel Pacing Threshold Pulse Width: 0.4 ms
Lead Channel Pacing Threshold Pulse Width: 0.4 ms
Lead Channel Sensing Intrinsic Amplitude: 0.5 mV
Lead Channel Sensing Intrinsic Amplitude: 0.5 mV
Lead Channel Sensing Intrinsic Amplitude: 20.25 mV
Lead Channel Sensing Intrinsic Amplitude: 20.25 mV
Lead Channel Setting Pacing Amplitude: 2.5 V
Lead Channel Setting Pacing Pulse Width: 0.4 ms
Lead Channel Setting Sensing Sensitivity: 2 mV

## 2020-09-02 ENCOUNTER — Other Ambulatory Visit: Payer: Self-pay

## 2020-09-02 ENCOUNTER — Ambulatory Visit (INDEPENDENT_AMBULATORY_CARE_PROVIDER_SITE_OTHER): Payer: BC Managed Care – PPO

## 2020-09-02 DIAGNOSIS — I480 Paroxysmal atrial fibrillation: Secondary | ICD-10-CM | POA: Diagnosis not present

## 2020-09-02 DIAGNOSIS — Z7901 Long term (current) use of anticoagulants: Secondary | ICD-10-CM | POA: Diagnosis not present

## 2020-09-02 LAB — POCT INR: INR: 3.1 — AB (ref 2.0–3.0)

## 2020-09-02 NOTE — Progress Notes (Signed)
Remote pacemaker transmission.   

## 2020-09-02 NOTE — Patient Instructions (Signed)
continue 1/2 tablet everyday except 1 tablet on Mondays and Fridays.  Repeat INR in 6 weeks..  Eat coleslaw today or tomorrow  Call [phone] if for questions

## 2020-09-24 ENCOUNTER — Other Ambulatory Visit: Payer: Self-pay | Admitting: Cardiovascular Disease

## 2020-10-14 ENCOUNTER — Other Ambulatory Visit: Payer: Self-pay

## 2020-10-14 ENCOUNTER — Other Ambulatory Visit: Payer: Self-pay | Admitting: Cardiovascular Disease

## 2020-10-14 ENCOUNTER — Ambulatory Visit (INDEPENDENT_AMBULATORY_CARE_PROVIDER_SITE_OTHER): Payer: BC Managed Care – PPO

## 2020-10-14 DIAGNOSIS — Z7901 Long term (current) use of anticoagulants: Secondary | ICD-10-CM

## 2020-10-14 DIAGNOSIS — I480 Paroxysmal atrial fibrillation: Secondary | ICD-10-CM | POA: Diagnosis not present

## 2020-10-14 LAB — POCT INR: INR: 2.8 (ref 2.0–3.0)

## 2020-10-14 NOTE — Patient Instructions (Signed)
continue 1/2 tablet everyday except 1 tablet on Mondays and Fridays.  Repeat INR in 6 weeks..   Call [phone] if for questions

## 2020-11-20 ENCOUNTER — Other Ambulatory Visit: Payer: Self-pay | Admitting: Cardiovascular Disease

## 2020-11-22 ENCOUNTER — Ambulatory Visit (INDEPENDENT_AMBULATORY_CARE_PROVIDER_SITE_OTHER): Payer: BC Managed Care – PPO

## 2020-11-22 DIAGNOSIS — I4821 Permanent atrial fibrillation: Secondary | ICD-10-CM | POA: Diagnosis not present

## 2020-11-22 LAB — CUP PACEART REMOTE DEVICE CHECK
Battery Remaining Longevity: 143 mo
Battery Voltage: 3.03 V
Brady Statistic AP VP Percent: 0 %
Brady Statistic AP VS Percent: 0 %
Brady Statistic AS VP Percent: 43.49 %
Brady Statistic AS VS Percent: 56.51 %
Brady Statistic RA Percent Paced: 0 %
Brady Statistic RV Percent Paced: 43.49 %
Date Time Interrogation Session: 20220308004340
Implantable Lead Implant Date: 20120229
Implantable Lead Implant Date: 20120229
Implantable Lead Location: 753859
Implantable Lead Location: 753860
Implantable Pulse Generator Implant Date: 20200605
Lead Channel Impedance Value: 342 Ohm
Lead Channel Impedance Value: 456 Ohm
Lead Channel Impedance Value: 513 Ohm
Lead Channel Impedance Value: 551 Ohm
Lead Channel Pacing Threshold Amplitude: 0.75 V
Lead Channel Pacing Threshold Amplitude: 0.75 V
Lead Channel Pacing Threshold Pulse Width: 0.4 ms
Lead Channel Pacing Threshold Pulse Width: 0.4 ms
Lead Channel Sensing Intrinsic Amplitude: 0.5 mV
Lead Channel Sensing Intrinsic Amplitude: 0.5 mV
Lead Channel Sensing Intrinsic Amplitude: 16.75 mV
Lead Channel Sensing Intrinsic Amplitude: 16.75 mV
Lead Channel Setting Pacing Amplitude: 2.5 V
Lead Channel Setting Pacing Pulse Width: 0.4 ms
Lead Channel Setting Sensing Sensitivity: 2 mV

## 2020-11-25 ENCOUNTER — Ambulatory Visit (INDEPENDENT_AMBULATORY_CARE_PROVIDER_SITE_OTHER): Payer: BC Managed Care – PPO

## 2020-11-25 ENCOUNTER — Other Ambulatory Visit: Payer: Self-pay

## 2020-11-25 DIAGNOSIS — Z7901 Long term (current) use of anticoagulants: Secondary | ICD-10-CM

## 2020-11-25 DIAGNOSIS — I480 Paroxysmal atrial fibrillation: Secondary | ICD-10-CM | POA: Diagnosis not present

## 2020-11-25 LAB — POCT INR: INR: 2.6 (ref 2.0–3.0)

## 2020-11-25 NOTE — Patient Instructions (Signed)
continue 1/2 tablet everyday except 1 tablet on Mondays and Fridays.  Repeat INR in 6 weeks..   Call [phone] if for questions

## 2020-11-28 ENCOUNTER — Telehealth: Payer: Self-pay | Admitting: Cardiovascular Disease

## 2020-11-28 MED ORDER — NITROGLYCERIN 0.4 MG SL SUBL: SUBLINGUAL_TABLET | SUBLINGUAL | 0 refills | Status: DC

## 2020-11-28 NOTE — Telephone Encounter (Signed)
*  STAT* If patient is at the pharmacy, call can be transferred to refill team.   1. Which medications need to be refilled? (please list name of each medication and dose if known)  nitroGLYCERIN (NITROSTAT) 0.4 MG SL tablet  2. Which pharmacy/location (including street and city if local pharmacy) is medication to be sent to? [facility] - [place],  - [address]  3. Do they need a 30 day or 90 day supply? 30 with refills  

## 2020-11-28 NOTE — Telephone Encounter (Signed)
S/w wife per [facility]) needs updated DPR added to appt notes.  Niitro refilled to requested pharmacy.

## 2020-11-30 NOTE — Progress Notes (Signed)
Remote pacemaker transmission.   

## 2020-12-19 ENCOUNTER — Other Ambulatory Visit: Payer: Self-pay | Admitting: Cardiovascular Disease

## 2020-12-23 ENCOUNTER — Other Ambulatory Visit: Payer: Self-pay | Admitting: Cardiovascular Disease

## 2020-12-26 ENCOUNTER — Encounter: Payer: BC Managed Care – PPO | Admitting: Cardiovascular Disease

## 2020-12-27 ENCOUNTER — Other Ambulatory Visit: Payer: Self-pay

## 2020-12-27 ENCOUNTER — Ambulatory Visit (INDEPENDENT_AMBULATORY_CARE_PROVIDER_SITE_OTHER): Payer: BC Managed Care – PPO | Admitting: Cardiovascular Disease

## 2020-12-27 ENCOUNTER — Encounter: Payer: Self-pay | Admitting: Cardiovascular Disease

## 2020-12-27 VITALS — BP 130/70 | HR 61 | Ht 70.0 in | Wt 217.0 lb

## 2020-12-27 DIAGNOSIS — I25700 Atherosclerosis of coronary artery bypass graft(s), unspecified, with unstable angina pectoris: Secondary | ICD-10-CM | POA: Diagnosis not present

## 2020-12-27 DIAGNOSIS — I480 Paroxysmal atrial fibrillation: Secondary | ICD-10-CM

## 2020-12-27 DIAGNOSIS — E782 Mixed hyperlipidemia: Secondary | ICD-10-CM | POA: Diagnosis not present

## 2020-12-27 DIAGNOSIS — Z7901 Long term (current) use of anticoagulants: Secondary | ICD-10-CM

## 2020-12-27 DIAGNOSIS — I4821 Permanent atrial fibrillation: Secondary | ICD-10-CM

## 2020-12-27 DIAGNOSIS — F4323 Adjustment disorder with mixed anxiety and depressed mood: Secondary | ICD-10-CM

## 2020-12-27 DIAGNOSIS — Z950 Presence of cardiac pacemaker: Secondary | ICD-10-CM

## 2020-12-27 DIAGNOSIS — R55 Syncope and collapse: Secondary | ICD-10-CM

## 2020-12-27 DIAGNOSIS — E119 Type 2 diabetes mellitus without complications: Secondary | ICD-10-CM

## 2020-12-27 DIAGNOSIS — R079 Chest pain, unspecified: Secondary | ICD-10-CM

## 2020-12-27 LAB — PACEMAKER DEVICE OBSERVATION

## 2020-12-27 MED ORDER — CLONAZEPAM 0.5 MG PO TABS
0.5000 mg | ORAL_TABLET | Freq: Two times a day (BID) | ORAL | 0 refills | Status: AC | PRN
Start: 2020-12-27 — End: ?

## 2020-12-27 NOTE — Patient Instructions (Signed)
Medication Instructions:  No changes *If you need a refill on your cardiac medications before your next appointment, please call your pharmacy*   Lab Work: Your provider would like for you to return in when you have the Lexiscan Myoview to have the following labs drawn: fasting Lipid, CMET and A1C. You do not need an appointment for the lab. Once in our office lobby there is a podium where you can sign in and ring the doorbell to alert [place] that you are here. The lab is open from 8:00 am to 4:30 pm; closed for lunch from 12:45pm-1:45pm.  If you have labs (blood work) drawn today and your tests are completely normal, you will receive your results only by: Marland Kitchen MyChart Message (if you have MyChart) OR . A paper copy in the mail If you have any lab test that is abnormal or we need to change your treatment, we will call you to review the results.   Testing/Procedures: Your physician has requested that you have a lexiscan myoview. For further information please visit [email or web address]. Please follow instruction sheet, as given. This will take place at [place], [address]  How to prepare for your Myocardial Perfusion Test:  Do not eat or drink 3 hours prior to your test, except you may have water.  Do not consume products containing caffeine (regular or decaffeinated) 12 hours prior to your test. (ex: coffee, chocolate, sodas, tea).  Do bring a list of your current medications with you.  If not listed below, you may take your medications as normal.  Do wear comfortable clothes (no dresses or overalls) and walking shoes, tennis shoes preferred (No heels or open toe shoes are allowed).  Do NOT wear cologne, perfume, aftershave, or lotions (deodorant is allowed).  The test will take approximately 3 to 4 hours to complete  If these instructions are not followed, your test will have to be rescheduled.     Follow-Up: At [facility], you and your health needs are our priority.  As  part of our continuing mission to provide you with exceptional heart care, we have created designated Provider Care Teams.  These Care Teams include your primary Cardiologist (physician) and Advanced Practice Providers (APPs -  Physician Assistants and Nurse Practitioners) who all work together to provide you with the care you need, when you need it.  We recommend signing up for the patient portal called "MyChart".  Sign up information is provided on this After Visit Summary.  MyChart is used to connect with patients for Virtual Visits (Telemedicine).  Patients are able to view lab/test results, encounter notes, upcoming appointments, etc.  Non-urgent messages can be sent to your provider as well.   To learn more about what you can do with MyChart, go to [email or web address].    Your next appointment:   12 month(s)  The format for your next appointment:   In Person  Provider:   You may see [name], MD or one of the following Advanced Practice Providers on your designated Care Team:    Almyra Deforest, PA-C  Fabian Sharp, PA-C or   Roby Lofts, [place]

## 2020-12-30 NOTE — Progress Notes (Signed)
Cardiology Office Note   Date:  12/30/2020   ID:  Lucas Porter, DOB [birth year], MRN [id number]  PCP:  Fabio Neighbors, NP  Cardiologist:  [name], MD  Electrophysiologist:  None   Evaluation Performed:  Follow-Up Visit  Chief Complaint:  Chest pain  History of Present Illness:    Lucas Porter is a 65 y.o. 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  It has been roughly 18 months since his last revascularization procedure.  Roughly 1 year ago (6 months after the last PCI) he had a cardiac catheterization that showed no targets for revascularization (patent, mild in-stent restenosis in the left main-left circumflex stent, patent stent in the OM branch of the circumflex, patent stent in the SVG to diagonal, patent SVG to ramus intermedius and LIMA to LAD, known chronic total occlusion of both the native right coronary artery and the SVG to RCA).  Medical therapy including long-term treatment with clopidogrel was recommended.  Diagnostic Dominance: Co-dominant    About 2 weeks ago he had a prolonged episode of chest discomfort at rest.  There was no obvious precipitant.  He took a nitroglycerin and the symptoms resolved for about 10 minutes then recurred so he took another nitroglycerin that led to resolution of the symptoms.  He is on 3 antianginal medications: Diltiazem 180 mg daily, metoprolol tartrate 100 mg twice daily and isosorbide mononitrate 60 mg twice daily.  He is compliant with statin.  He takes both warfarin and  clopidogrel.  He has not had any new episodes of chest discomfort in the ensuing 2 weeks.  He denies dyspnea at rest or with cavity.  He does not have claudication, focal neurological complaints, falls or bleeding problems, lower extremity edema, orthopnea, PND, wheezing or major weight changes.  He denies palpitations, syncope or near syncope.  He has chronic problems with anxiety and has recently run out of his clonazepam prescription.  He usually gets that from his primary care physician  Pacemaker interrogation shows normal device function (Medtronic Azure DR).  He had a generator change in 2020 and has roughly 11.8 years of remaining generator longevity.  Lead parameters are all normal and stable.  He only has 40% ventricular pacing (he is not device dependent).  Overall, rate control is good.  He has a handful of episodes of high ventricular rate which all represent A. fib with RVR   Past Medical History:  Diagnosis Date  . Anxiety   . Atrial fibrillation (HCC)    paroxysmal  . Atrial flutter ([place])   . Coagulopathy ([place])    persistent elevation of ACT during 10/17/11 hospitalization  . Coronary artery disease    s/p CABG 2010, s/p Successful PCI  10/17/11 of Distal Left Main and the Left Circumflex with a Promus Element DES stent -- 4.0 mm x 16 mm postdilated to 4.12 mm   . DDD (degenerative disc disease)   . Depression   . Dysrhythmia    afib  . Facial asymmetry 2010   pt. stated it was very temporary, never seen by doctor for it.   Marland Kitchen GERD (gastroesophageal reflux disease)   . Heart murmur   .  Hyperlipidemia   . Hypertension   . Kidney stones   . Presence of permanent cardiac pacemaker    Medtronic   . S/P coronary artery stent placement to distal Lt. main and Lt. Cx 10/17/11  . Tachycardia-bradycardia syndrome ([place]) 2/12   s/p PPM (MDT) by Dr Blanch Media   Past Surgical History:  Procedure Laterality Date  . ATRIAL FIBRILLATION ABLATION  04/24/12, 12/02/12   PVI x2 and CTI  ablation by Dr Rayann Heman  . ATRIAL FIBRILLATION ABLATION N/A 04/24/2012   Procedure: ATRIAL FIBRILLATION ABLATION;  Surgeon: Thompson Grayer, MD;  Location: [facility] CATH LAB;  Service: Cardiovascular;  Laterality: N/A;  . ATRIAL FIBRILLATION ABLATION N/A 12/02/2012   Procedure: ATRIAL FIBRILLATION ABLATION;  Surgeon: Thompson Grayer, MD;  Location: [facility] CATH LAB;  Service: Cardiovascular;  Laterality: N/A;  . CORONARY ANGIOPLASTY  10/19/11  . CORONARY ARTERY BYPASS GRAFT  2010  . CORONARY STENT INTERVENTION N/A 04/13/2019   Procedure: CORONARY STENT INTERVENTION;  Surgeon: Nelva Bush, MD;  Location: [place] CV LAB;  Service: Cardiovascular;  Laterality: N/A;  . CORONARY STENT INTERVENTION N/A 07/28/2019   Procedure: CORONARY STENT INTERVENTION;  Surgeon: Jettie Booze, MD;  Location: [place] CV LAB;  Service: Cardiovascular;  Laterality: N/A;  . CYSTOSCOPY  2007 ?   treatment for Kidney stones   . INGUINAL HERNIA REPAIR Left 01/04/2016   Procedure: LAPAROSCOPIC LEFT INGUINAL HERNIA WITH MESH;  Surgeon: Ralene Ok, MD;  Location: [place];  Service: General;  Laterality: Left;  . INSERT / REPLACE / REMOVE PACEMAKER  2/12   Medtronic  . INSERTION OF MESH Left 01/04/2016   Procedure: INSERTION OF MESH;  Surgeon: Ralene Ok, MD;  Location: [place];  Service: General;  Laterality: Left;  . LAPAROSCOPIC LYSIS OF ADHESIONS Left 01/04/2016   Procedure: LAPAROSCOPIC LYSIS OF ADHESIONS;  Surgeon: Ralene Ok, MD;  Location: [place];  Service: General;  Laterality: Left;  . LEFT HEART CATH AND CORS/GRAFTS ANGIOGRAPHY N/A 02/20/2019   Procedure: LEFT HEART CATH AND CORS/GRAFTS ANGIOGRAPHY;  Surgeon: Belva Crome, MD;  Location: [place] CV LAB;  Service: Cardiovascular;  Laterality: N/A;  . LEFT HEART CATH AND CORS/GRAFTS ANGIOGRAPHY N/A 04/13/2019   Procedure: LEFT HEART CATH AND CORS/GRAFTS ANGIOGRAPHY;  Surgeon: Nelva Bush, MD;  Location: [place] CV LAB;  Service: Cardiovascular;   Laterality: N/A;  . LEFT HEART CATH AND CORS/GRAFTS ANGIOGRAPHY N/A 07/28/2019   Procedure: LEFT HEART CATH AND CORS/GRAFTS ANGIOGRAPHY;  Surgeon: Jettie Booze, MD;  Location: [place] CV LAB;  Service: Cardiovascular;  Laterality: N/A;  . LEFT HEART CATH AND CORS/GRAFTS ANGIOGRAPHY N/A 02/09/2020   Procedure: LEFT HEART CATH AND CORS/GRAFTS ANGIOGRAPHY;  Surgeon: Sherren Mocha, MD;  Location: [place] CV LAB;  Service: Cardiovascular;  Laterality: N/A;  . LEFT HEART CATHETERIZATION WITH CORONARY/GRAFT ANGIOGRAM N/A 10/17/2011   Procedure: LEFT HEART CATHETERIZATION WITH Beatrix Fetters;  Surgeon: [name], MD;  Location: [place] CATH LAB;  Service: Cardiovascular;  Laterality: N/A;  . LEFT HEART CATHETERIZATION WITH CORONARY/GRAFT ANGIOGRAM N/A 02/08/2012   Procedure: LEFT HEART CATHETERIZATION WITH Beatrix Fetters;  Surgeon: [name], MD;  Location: [place] CATH LAB;  Service: Cardiovascular;  Laterality: N/A;  . NM MYOCAR PERF WALL MOTION  03/23/2011   Low risk  . PERCUTANEOUS CORONARY STENT INTERVENTION (PCI-S)  10/17/2011   Procedure: PERCUTANEOUS CORONARY STENT INTERVENTION (PCI-S);  Surgeon: [name], MD;  Location: [facility] CATH LAB;  Service: Cardiovascular;;  . PPM GENERATOR CHANGEOUT N/A 02/20/2019   Procedure: PPM GENERATOR  CHANGEOUT;  Surgeon: [name], MD;  Location: [place] CV LAB;  Service: Cardiovascular;  Laterality: N/A;  . TEE WITHOUT CARDIOVERSION  04/23/2012   Procedure: TRANSESOPHAGEAL ECHOCARDIOGRAM (TEE);  Surgeon: Josue Hector, MD;  Location: [facility] ENDOSCOPY;  Service: Cardiovascular;  Laterality: N/A;  spoke to pt about time change from 11a to 12p/DL  . TEE WITHOUT CARDIOVERSION N/A 12/01/2012   Procedure: TRANSESOPHAGEAL ECHOCARDIOGRAM (TEE);  Surgeon: [name], MD;  Location[facility] ENDOSCOPY;  Service: Cardiovascular;  Laterality: N/A;     Current Meds  Medication Sig  . acetaminophen (TYLENOL) 500 MG tablet Take 1,000 mg by  mouth every 6 (six) hours as needed for moderate pain.  Marland Kitchen clopidogrel (PLAVIX) 75 MG tablet TAKE 1 TABLET BY MOUTH ONCE DAILY **NEED  APPOINTMENT  FOR  FUTURE  REFILLS**  . DILT-XR 180 MG 24 hr capsule Take 1 capsule by mouth once daily  . Doxylamine Succinate, Sleep, (SLEEP AID PO) Take 1 tablet by mouth at bedtime as needed (sleep).  . fish oil-omega-3 fatty acids 1000 MG capsule Take 2 g by mouth daily.   . isosorbide mononitrate (IMDUR) 60 MG 24 hr tablet Take 1 tablet by mouth twice daily  . lisinopril (ZESTRIL) 20 MG tablet Take 1 tablet (20 mg total) by mouth daily.  . metFORMIN (GLUCOPHAGE) 500 MG tablet Take 1 tablet (500 mg total) by mouth 2 (two) times daily with a meal. (Patient taking differently: Take 1,000 mg by mouth 2 (two) times daily with a meal.)  . metoprolol tartrate (LOPRESSOR) 100 MG tablet Take 100 mg by mouth 2 (two) times daily.  . nitroGLYCERIN (NITROSTAT) 0.4 MG SL tablet Take one tablet as needed for chest pain, can take up to three times 5 min apart after 3 tablets and no relief call 911.  . pantoprazole (PROTONIX) 40 MG tablet Take 1 tablet by mouth once daily  . potassium chloride (KLOR-CON) 10 MEQ tablet TAKE 1  BY MOUTH ONCE DAILY  . rosuvastatin (CRESTOR) 40 MG tablet TAKE 1 TABLET BY MOUTH ONCE DAILY (Patient taking differently: Take 40 mg by mouth daily.)  . venlafaxine XR (EFFEXOR-XR) 150 MG 24 hr capsule TAKE ONE CAPSULE BY MOUTH ONCE DAILY WITH  THE  75  MG  TO  TOTAL  225  MG  DAILY (Patient taking differently: Take 150 mg by mouth daily.)  . venlafaxine XR (EFFEXOR-XR) 75 MG 24 hr capsule TAKE ONE CAPSULE BY MOUTH ONCE DAILY TAKE  WITH  150MG   TO  EQUAL  A  TOTAL  OF  225MG   DAILY (Patient taking differently: Take 75 mg by mouth at bedtime.)  . warfarin (COUMADIN) 5 MG tablet Take 1/2 to 1 tablet by mouth daily as directed by coumadin clinic  . [DISCONTINUED] aspirin 81 MG chewable tablet Chew 1 tablet (81 mg total) by mouth daily.  . [DISCONTINUED]  clonazePAM (KLONOPIN) 0.5 MG tablet Take 1 tablet (0.5 mg total) by mouth 2 (two) times daily as needed for anxiety.  . [DISCONTINUED] isosorbide mononitrate (IMDUR) 30 MG 24 hr tablet TAKE 3 TABLETS BY MOUTH TWICE DAILY     Allergies:   Patient has no known allergies.   Social History   Tobacco Use  . Smoking status: Former Smoker    Years: 1.00    Types: Cigarettes    Quit date: 12/18/1963    Years since quitting: 57.0  . Smokeless tobacco: Former Systems developer    Quit date: 10/03/2012  . Tobacco comment: quit smoking in 1970's  Vaping Use  . Vaping Use: Never used  Substance Use Topics  . Alcohol use: Yes    Alcohol/week: 1.0 standard drink    Types: 1 Cans of beer per week    Comment: rare  . Drug use: Yes    Types: Marijuana    Comment: sometimes (down from every day)     Family Hx: The patient's family history includes Alzheimer's disease in his father; Colon cancer in his mother; Coronary artery disease in an other family member; Diabetes in his brother; Heart attack in his brother; Heart disease in his brother and father; Hypertension in his brother, brother, and sister; Stroke in his brother. There is no history of Stomach cancer or Pancreatic cancer.  ROS:   Please see the history of present illness.    All other systems reviewed and are negative.   Prior CV studies:   The following studies were reviewed today: Coronary angiography July 28, 2019   Dist LM to [facility] LAD lesion is 100% stenosed.  Ost LM to Dist LM lesion is 10% stenosed.  Ost 1st Diag lesion is 100% stenosed.  Ost 2nd Mrg lesion is 90% stenosed.  Non-stenotic Dist Cx lesion was previously treated.  Previously placed 3rd Mrg drug eluting stent is widely patent.  Previously placed Ost Cx to Prox Cx drug eluting stent is widely patent.  Balloon angioplasty was performed.  Ost Ramus lesion is 100% stenosed.  LIMA and is large.  SVG.  Prox Graft lesion is 40% stenosed.  SVG and is normal in  caliber.  Non-stenotic Prox Graft lesion was previously treated.  Ost RCA to Prox RCA lesion is 100% stenosed.  Origin lesion is 100% stenosed.   1. Severe native vessel CAD with mild in-stent restenosis of the left main/left circumflex stent, continued patency of the stented segment in the OM branch of the circumflex 2. Chronic RCA occlusion with chronic occlusion of the SVG-RCA 3. Continued patency of the SVG-ramus intermedius, SVG-diagonal, and LIMA-LAD 4. Normal LVEDP  Recommend: continued medical therapy. Ok to resume warfarin tonight. Continue clopidogrel long term   Labs/Other Tests and Data Reviewed:    EKG: ECG is performed today shows atrial fibrillation with controlled ventricular rate and occasional ventricular pacing.  There are nonspecific lateral ST segment changes that are similar to previous tracings  Recent Labs: 02/10/2020: ALT 21; BUN 13; Creatinine, Ser 0.75; Hemoglobin 13.5; Platelets 167; Potassium 3.8; Sodium 137   Recent Lipid Panel Lab Results  Component Value Date/Time   CHOL 179 02/10/2020 06:21 AM   CHOL 117 04/09/2019 03:32 PM   TRIG 378 (H) 02/10/2020 06:21 AM   HDL 27 (L) 02/10/2020 06:21 AM   HDL 40 04/09/2019 03:32 PM   CHOLHDL 6.6 02/10/2020 06:21 AM   LDLCALC 76 02/10/2020 06:21 AM   LDLCALC 26 04/09/2019 03:32 PM    Wt Readings from Last 3 Encounters:  12/27/20 217 lb (98.4 kg)  03/07/20 222 lb (100.7 kg)  02/10/20 221 lb 12.8 oz (100.6 kg)     Objective:    Vital Signs:  BP 130/70 (BP Location: Right Arm, Patient Position: Sitting, Cuff Size: Normal)   Pulse 61   Ht 5\' 10"  (1.778 m)   Wt 217 lb (98.4 kg)   BMI 31.14 kg/m     General: Alert, oriented x3, no distress, obese.  Healthy left subclavian pacemaker site. Head: no evidence of trauma, PERRL, EOMI, no exophtalmos or lid lag, no myxedema, no xanthelasma; normal ears, nose and oropharynx Neck:  normal jugular venous pulsations and no hepatojugular reflux; brisk carotid  pulses without delay and no carotid bruits Chest: clear to auscultation, no signs of consolidation by percussion or palpation, normal fremitus, symmetrical and full respiratory excursions Cardiovascular: normal position and quality of the apical impulse, irregular rhythm, normal first and second heart sounds, no murmurs, rubs or gallops Abdomen: no tenderness or distention, no masses by palpation, no abnormal pulsatility or arterial bruits, normal bowel sounds, no hepatosplenomegaly Extremities: no clubbing, cyanosis or edema; 2+ radial, ulnar and brachial pulses bilaterally; 2+ right femoral, posterior tibial and dorsalis pedis pulses; 2+ left femoral, posterior tibial and dorsalis pedis pulses; no subclavian or femoral bruits Neurological: grossly nonfocal Psych: Normal mood and affect   ASSESSMENT & PLAN:    1. Coronary artery disease involving coronary bypass graft of native heart with unstable angina pectoris ([place])   2. Permanent atrial fibrillation (HCC)   3. Cardiac pacemaker in situ   4. Long term (current) use of anticoagulants   5. Neurocardiogenic syncope   6. Mixed hyperlipidemia   7. Type 2 diabetes mellitus without complication, without long-term current use of insulin ([place])   8. Adjustment disorder with mixed anxiety and depressed mood      1. CAD s/p CABG and PCI x4 w recurrent angina: He is on close to maximal medical therapy including beta-blocker, calcium channel blocker, isosorbide all in relatively high doses (we decreased his diltiazem dose since he had excessive ventricular pacing).  Consider adding Ranexa.  He is on antiplatelet and anticoagulant therapy and is compliant with a statin with a good LDL cholesterol level.  Not a whole lot of additional options for medical therapy.  Before we commit to another angiogram, I would recommend looking for ischemia with a myocardial perfusion study.  Thankfully, he has not had any symptoms in the last 2 weeks. 2. AFib:  Longstanding persistent arrhythmia that is likely permanent.  He is well rate controlled on the current doses of diltiazem and metoprolol. 3. PM: Normal device function.  He is not pacemaker dependent.  His pacemaker was initially implanted for tachycardia-bradycardia syndrome and he now has permanent atrial fibrillation.  The device has proven to be excellent treatment for his previous frequent episodes of neurally mediated syncope.  Continue downloads every 3 months 4. Anticoagulation: Moderate embolic risk. CHADSVasc 3-4 (HTN, DM, CAD, almost 67).  Tolerating warfarin plus clopidogrel without bleeding complications. 5. Neurally mediated syncope: Since pacemaker implantation he has not had any episodes of syncope or even near syncope.  Seems to be tolerating vasodilator therapy okay. 6. Obesity/DM/HLP: He has managed to lose 4 to 5 pounds since last year.  HDL is very low at 27 and will only improve with weight loss.  LDL cholesterol is close to target range (less than 70), but the triglycerides and hemoglobin A1c are too high.  (All of these labs are from May 2021 and there may have been significant changes since then).  Need to get updated labs. 7. Anxiety: I agreed to give him a short supply of his anxiolytic until he can see his primary care provider.   Risk Assessment/Calculations:    CHA2DS2-VASc Score = 3  This indicates a 3.2% annual risk of stroke. The patient's score is based upon: CHF History: No HTN History: Yes Diabetes History: Yes Stroke History: No Vascular Disease History: Yes Age Score: 0 Gender Score: 0      Shared Decision Making/Informed Consent The risks [chest pain, shortness of breath, cardiac arrhythmias, dizziness,  blood pressure fluctuations, myocardial infarction, stroke/transient ischemic attack, nausea, vomiting, allergic reaction, radiation exposure, metallic taste sensation and life-threatening complications (estimated to be 1 in 10,000)], benefits (risk  stratification, diagnosing coronary artery disease, treatment guidance) and alternatives of a nuclear stress test were discussed in detail with Lucas Porter and he agrees to proceed.       Medication Adjustments/Labs and Tests Ordered: Current medicines are reviewed at length with the patient today.  Concerns regarding medicines are outlined above.  Orders Placed This Encounter  Procedures  . Comprehensive metabolic panel  . Lipid panel  . Hemoglobin A1c  . MYOCARDIAL PERFUSION IMAGING  . EKG 12-Lead   Meds ordered this encounter  Medications  . clonazePAM (KLONOPIN) 0.5 MG tablet    Sig: Take 1 tablet (0.5 mg total) by mouth 2 (two) times daily as needed for anxiety.    Dispense:  15 tablet    Refill:  0    Patient Instructions  Medication Instructions:  No changes *If you need a refill on your cardiac medications before your next appointment, please call your pharmacy*   Lab Work: Your provider would like for you to return in when you have the Lexiscan Myoview to have the following labs drawn: fasting Lipid, CMET and A1C. You do not need an appointment for the lab. Once in our office lobby there is a podium where you can sign in and ring the doorbell to alert [place] that you are here. The lab is open from 8:00 am to 4:30 pm; closed for lunch from 12:45pm-1:45pm.  If you have labs (blood work) drawn today and your tests are completely normal, you will receive your results only by: Marland Kitchen MyChart Message (if you have MyChart) OR . A paper copy in the mail If you have any lab test that is abnormal or we need to change your treatment, we will call you to review the results.   Testing/Procedures: Your physician has requested that you have a lexiscan myoview. For further information please visit [email or web address]. Please follow instruction sheet, as given. This will take place at [address], [address]  How to prepare for your Myocardial Perfusion Test:  Do not eat or drink 3  hours prior to your test, except you may have water.  Do not consume products containing caffeine (regular or decaffeinated) 12 hours prior to your test. (ex: coffee, chocolate, sodas, tea).  Do bring a list of your current medications with you.  If not listed below, you may take your medications as normal.  Do wear comfortable clothes (no dresses or overalls) and walking shoes, tennis shoes preferred (No heels or open toe shoes are allowed).  Do NOT wear cologne, perfume, aftershave, or lotions (deodorant is allowed).  The test will take approximately 3 to 4 hours to complete  If these instructions are not followed, your test will have to be rescheduled.     Follow-Up: At [facility], you and your health needs are our priority.  As part of our continuing mission to provide you with exceptional heart care, we have created designated Provider Care Teams.  These Care Teams include your primary Cardiologist (physician) and Advanced Practice Providers (APPs -  Physician Assistants and Nurse Practitioners) who all work together to provide you with the care you need, when you need it.  We recommend signing up for the patient portal called "MyChart".  Sign up information is provided on this After Visit Summary.  MyChart is used to connect with patients for  Virtual Visits (Telemedicine).  Patients are able to view lab/test results, encounter notes, upcoming appointments, etc.  Non-urgent messages can be sent to your provider as well.   To learn more about what you can do with MyChart, go to [email or web address].    Your next appointment:   12 month(s)  The format for your next appointment:   In Person  Provider:   You may see [name], MD or one of the following Advanced Practice Providers on your designated Care Team:    Almyra Deforest, PA-C  Fabian Sharp, [place] or   Roby Lofts, PA-C      Signed, [name], MD  12/30/2020 7:18 PM    [place]

## 2021-01-06 ENCOUNTER — Telehealth (HOSPITAL_COMMUNITY): Payer: Self-pay | Admitting: *Deleted

## 2021-01-06 ENCOUNTER — Ambulatory Visit (INDEPENDENT_AMBULATORY_CARE_PROVIDER_SITE_OTHER): Payer: BC Managed Care – PPO

## 2021-01-06 ENCOUNTER — Other Ambulatory Visit: Payer: Self-pay

## 2021-01-06 DIAGNOSIS — I480 Paroxysmal atrial fibrillation: Secondary | ICD-10-CM

## 2021-01-06 DIAGNOSIS — Z7901 Long term (current) use of anticoagulants: Secondary | ICD-10-CM

## 2021-01-06 LAB — POCT INR: INR: 3 (ref 2.0–3.0)

## 2021-01-06 NOTE — Telephone Encounter (Signed)
Close encounter 

## 2021-01-06 NOTE — Patient Instructions (Signed)
continue 1/2 tablet everyday except 1 tablet on Mondays and Fridays.  Repeat INR in 6 weeks..   Call [phone] if for questions

## 2021-01-10 ENCOUNTER — Ambulatory Visit (HOSPITAL_COMMUNITY)
Admission: RE | Admit: 2021-01-10 | Discharge: 2021-01-10 | Disposition: A | Discharge status: Home / Self Care | Payer: BC Managed Care – PPO | Source: Ambulatory Visit | Attending: Cardiovascular Disease | Admitting: Cardiovascular Disease

## 2021-01-10 ENCOUNTER — Other Ambulatory Visit: Payer: Self-pay

## 2021-01-10 DIAGNOSIS — I25700 Atherosclerosis of coronary artery bypass graft(s), unspecified, with unstable angina pectoris: Secondary | ICD-10-CM | POA: Diagnosis present

## 2021-01-10 LAB — MYOCARDIAL PERFUSION IMAGING
LV dias vol: 145 mL (ref 62–150)
LV sys vol: 68 mL
Peak HR: 66 {beats}/min
Rest HR: 60 {beats}/min
SDS: 1
SRS: 5
SSS: 6
TID: 1

## 2021-01-10 MED ORDER — TECHNETIUM TC 99M TETROFOSMIN IV KIT
9.9000 | PACK | Freq: Once | INTRAVENOUS | Status: AC | PRN
  Administered 2021-01-10: 9.9 via INTRAVENOUS
  Filled 2021-01-10: qty 10

## 2021-01-10 MED ORDER — AMINOPHYLLINE 25 MG/ML IV SOLN
75.0000 mg | Freq: Once | INTRAVENOUS | Status: AC
  Administered 2021-01-10: 75 mg via INTRAVENOUS

## 2021-01-10 MED ORDER — REGADENOSON 0.4 MG/5ML IV SOLN
0.4000 mg | Freq: Once | INTRAVENOUS | Status: AC
  Administered 2021-01-10: 0.4 mg via INTRAVENOUS

## 2021-01-10 MED ORDER — TECHNETIUM TC 99M TETROFOSMIN IV KIT
31.6000 | PACK | Freq: Once | INTRAVENOUS | Status: AC | PRN
  Administered 2021-01-10: 31.6 via INTRAVENOUS
  Filled 2021-01-10: qty 32

## 2021-01-11 LAB — COMPREHENSIVE METABOLIC PANEL
ALT: 13 IU/L (ref 0–44)
AST: 19 IU/L (ref 0–40)
Albumin/Globulin Ratio: 2.2 (ref 1.2–2.2)
Albumin: 4.6 g/dL (ref 3.8–4.8)
Alkaline Phosphatase: 92 IU/L (ref 44–121)
BUN/Creatinine Ratio: 27 — ABNORMAL HIGH (ref 10–24)
BUN: 21 mg/dL (ref 8–27)
Bilirubin Total: 0.6 mg/dL (ref 0.0–1.2)
CO2: 20 mmol/L (ref 20–29)
Calcium: 9.9 mg/dL (ref 8.6–10.2)
Chloride: 99 mmol/L (ref 96–106)
Creatinine, Ser: 0.79 mg/dL (ref 0.76–1.27)
Globulin, Total: 2.1 g/dL (ref 1.5–4.5)
Glucose: 208 mg/dL — ABNORMAL HIGH (ref 65–99)
Potassium: 4.6 mmol/L (ref 3.5–5.2)
Sodium: 137 mmol/L (ref 134–144)
Total Protein: 6.7 g/dL (ref 6.0–8.5)
eGFR: 99 mL/min/{1.73_m2} (ref >59)

## 2021-01-11 LAB — LIPID PANEL
Chol/HDL Ratio: 3.6 ratio (ref 0.0–5.0)
Cholesterol, Total: 132 mg/dL (ref 100–199)
HDL: 37 mg/dL — ABNORMAL LOW (ref >39)
LDL Chol Calc (NIH): 50 mg/dL (ref 0–99)
Triglycerides: 290 mg/dL — ABNORMAL HIGH (ref 0–149)
VLDL Cholesterol Cal: 45 mg/dL — ABNORMAL HIGH (ref 5–40)

## 2021-01-11 LAB — HEMOGLOBIN A1C
Est. average glucose Bld gHb Est-mCnc: 166 mg/dL
Hgb A1c MFr Bld: 7.4 % — ABNORMAL HIGH (ref 4.8–5.6)

## 2021-01-12 ENCOUNTER — Encounter: Payer: Self-pay | Admitting: *Deleted

## 2021-01-13 ENCOUNTER — Other Ambulatory Visit: Payer: Self-pay | Admitting: Cardiovascular Disease

## 2021-01-16 ENCOUNTER — Other Ambulatory Visit: Payer: Self-pay | Admitting: Cardiovascular Disease

## 2021-01-16 MED ORDER — ISOSORBIDE MONONITRATE ER 60 MG PO TB24: 60.0000 mg | ORAL_TABLET | Freq: Two times a day (BID) | ORAL | 0 refills | Status: DC

## 2021-01-18 ENCOUNTER — Other Ambulatory Visit: Payer: Self-pay | Admitting: *Deleted

## 2021-01-18 MED ORDER — RANOLAZINE ER 500 MG PO TB12: 500.0000 mg | ORAL_TABLET | Freq: Two times a day (BID) | ORAL | 1 refills | Status: DC

## 2021-01-19 ENCOUNTER — Other Ambulatory Visit: Payer: Self-pay | Admitting: Cardiovascular Disease

## 2021-02-14 ENCOUNTER — Other Ambulatory Visit: Payer: Self-pay | Admitting: Cardiovascular Disease

## 2021-02-17 ENCOUNTER — Other Ambulatory Visit (HOSPITAL_COMMUNITY): Payer: Self-pay | Admitting: Adult Health

## 2021-02-17 ENCOUNTER — Other Ambulatory Visit: Payer: Self-pay

## 2021-02-17 ENCOUNTER — Ambulatory Visit (INDEPENDENT_AMBULATORY_CARE_PROVIDER_SITE_OTHER): Payer: Medicare Other

## 2021-02-17 DIAGNOSIS — I771 Stricture of artery: Secondary | ICD-10-CM

## 2021-02-17 DIAGNOSIS — Z7901 Long term (current) use of anticoagulants: Secondary | ICD-10-CM

## 2021-02-17 DIAGNOSIS — I480 Paroxysmal atrial fibrillation: Secondary | ICD-10-CM | POA: Diagnosis not present

## 2021-02-17 LAB — POCT INR: INR: 2.8 (ref 2.0–3.0)

## 2021-02-17 NOTE — Patient Instructions (Signed)
continue 1/2 tablet everyday except 1 tablet on Mondays and Fridays.  Repeat INR in 6 weeks..   Call [phone] if for questions

## 2021-02-21 ENCOUNTER — Ambulatory Visit (INDEPENDENT_AMBULATORY_CARE_PROVIDER_SITE_OTHER): Payer: Medicare Other

## 2021-02-21 DIAGNOSIS — I495 Sick sinus syndrome: Secondary | ICD-10-CM

## 2021-02-21 LAB — CUP PACEART REMOTE DEVICE CHECK
Battery Remaining Longevity: 141 mo
Battery Voltage: 3.03 V
Brady Statistic AP VP Percent: 0 %
Brady Statistic AP VS Percent: 0 %
Brady Statistic AS VP Percent: 39.35 %
Brady Statistic AS VS Percent: 60.65 %
Brady Statistic RA Percent Paced: 0 %
Brady Statistic RV Percent Paced: 39.35 %
Date Time Interrogation Session: 20220607014444
Implantable Lead Implant Date: 20120229
Implantable Lead Implant Date: 20120229
Implantable Lead Location: 753859
Implantable Lead Location: 753860
Implantable Pulse Generator Implant Date: 20200605
Lead Channel Impedance Value: 342 Ohm
Lead Channel Impedance Value: 475 Ohm
Lead Channel Impedance Value: 532 Ohm
Lead Channel Impedance Value: 551 Ohm
Lead Channel Pacing Threshold Amplitude: 0.75 V
Lead Channel Pacing Threshold Amplitude: 0.75 V
Lead Channel Pacing Threshold Pulse Width: 0.4 ms
Lead Channel Pacing Threshold Pulse Width: 0.4 ms
Lead Channel Sensing Intrinsic Amplitude: 0.5 mV
Lead Channel Sensing Intrinsic Amplitude: 0.5 mV
Lead Channel Sensing Intrinsic Amplitude: 18.25 mV
Lead Channel Sensing Intrinsic Amplitude: 18.25 mV
Lead Channel Setting Pacing Amplitude: 2.5 V
Lead Channel Setting Pacing Pulse Width: 0.4 ms
Lead Channel Setting Sensing Sensitivity: 2 mV

## 2021-03-13 ENCOUNTER — Ambulatory Visit (HOSPITAL_COMMUNITY): Admission: RE | Admit: 2021-03-13 | Payer: Medicare Other | Source: Ambulatory Visit

## 2021-03-15 NOTE — Progress Notes (Signed)
Remote pacemaker transmission.   

## 2021-03-17 ENCOUNTER — Telehealth: Payer: Self-pay | Admitting: Cardiovascular Disease

## 2021-03-17 DIAGNOSIS — I10 Essential (primary) hypertension: Secondary | ICD-10-CM

## 2021-03-17 MED ORDER — CLOPIDOGREL BISULFATE 75 MG PO TABS: 75.0000 mg | ORAL_TABLET | Freq: Every day | ORAL | 2 refills | Status: DC

## 2021-03-17 MED ORDER — PANTOPRAZOLE SODIUM 40 MG PO TBEC: 40.0000 mg | DELAYED_RELEASE_TABLET | Freq: Every day | ORAL | 2 refills | Status: DC

## 2021-03-17 MED ORDER — POTASSIUM CHLORIDE CRYS ER 10 MEQ PO TBCR
10.0000 meq | EXTENDED_RELEASE_TABLET | Freq: Once | ORAL | 2 refills | Status: DC
Start: 2021-03-17 — End: 2022-01-15

## 2021-03-17 MED ORDER — DILTIAZEM HCL ER 180 MG PO CP24: 180.0000 mg | ORAL_CAPSULE | Freq: Every day | ORAL | 2 refills | Status: DC

## 2021-03-17 MED ORDER — LISINOPRIL 20 MG PO TABS: 20.0000 mg | ORAL_TABLET | Freq: Every day | ORAL | 2 refills | Status: AC

## 2021-03-17 MED ORDER — ISOSORBIDE MONONITRATE ER 60 MG PO TB24: 60.0000 mg | ORAL_TABLET | Freq: Two times a day (BID) | ORAL | 2 refills | Status: DC

## 2021-03-17 NOTE — Telephone Encounter (Signed)
error 

## 2021-03-17 NOTE — Telephone Encounter (Signed)
*  STAT* If patient is at the pharmacy, call can be transferred to refill team.   1. Which medications need to be refilled? (please list name of each medication and dose if known)  clopidogrel (PLAVIX) 75 MG tablet DILT-XR 180 MG 24 hr capsule isosorbide mononitrate (IMDUR) 60 MG 24 hr tablet lisinopril (ZESTRIL) 20 MG tablet pantoprazole (PROTONIX) 40 MG tablet potassium chloride (KLOR-CON) 10 MEQ tablet warfarin (COUMADIN) 5 MG tablet  2. Which pharmacy/location (including street and city if local pharmacy) is medication to be sent to? [place] ([facility]) - [place], [place]   3. Do they need a 30 day or 90 day supply? 90 with refills   Wife of the patient called. The patient got new insurance and all the rx need to go to the mail order service now.

## 2021-03-17 NOTE — Telephone Encounter (Signed)
Refills for medications has been sent to the pharmacy.

## 2021-03-21 ENCOUNTER — Telehealth: Payer: Self-pay | Admitting: Cardiovascular Disease

## 2021-03-21 NOTE — Telephone Encounter (Signed)
Klo-Con was sent in for 41mEq PO x 1 dose. Clarified dose with pharmacy as 69mEq once daily, 90 tablets, x 3 refills. They had no additional questions.

## 2021-03-21 NOTE — Telephone Encounter (Signed)
Reference# [id number]    Need a medical clarification for Potassium Chloride. Need directions for this medication

## 2021-03-24 ENCOUNTER — Other Ambulatory Visit: Payer: Self-pay | Admitting: Cardiovascular Disease

## 2021-03-31 ENCOUNTER — Telehealth: Payer: Self-pay | Admitting: *Deleted

## 2021-03-31 ENCOUNTER — Ambulatory Visit (INDEPENDENT_AMBULATORY_CARE_PROVIDER_SITE_OTHER): Payer: Medicare Other

## 2021-03-31 ENCOUNTER — Other Ambulatory Visit: Payer: Self-pay | Admitting: *Deleted

## 2021-03-31 ENCOUNTER — Other Ambulatory Visit: Payer: Self-pay

## 2021-03-31 DIAGNOSIS — Z7901 Long term (current) use of anticoagulants: Secondary | ICD-10-CM | POA: Diagnosis not present

## 2021-03-31 DIAGNOSIS — I480 Paroxysmal atrial fibrillation: Secondary | ICD-10-CM

## 2021-03-31 LAB — POCT INR: INR: 2.2 (ref 2.0–3.0)

## 2021-03-31 MED ORDER — RANOLAZINE ER 500 MG PO TB12
500.0000 mg | ORAL_TABLET | Freq: Two times a day (BID) | ORAL | 11 refills | Status: DC
Start: 2021-03-31 — End: 2021-05-04

## 2021-03-31 NOTE — Telephone Encounter (Signed)
The patient came in today saying his Ranexa refill was denied. Call placed to the pharmacy. They stated that they only need a refill sent in.   Refill sent in for a year supply.   Attempted to call the patient back and was unable to leave a message.

## 2021-03-31 NOTE — Patient Instructions (Signed)
continue 1/2 tablet everyday except 1 tablet on Mondays and Fridays.  Repeat INR in 6 weeks..   Call [phone] if for questions

## 2021-05-04 ENCOUNTER — Other Ambulatory Visit: Payer: Self-pay

## 2021-05-04 MED ORDER — RANOLAZINE ER 500 MG PO TB12
500.0000 mg | ORAL_TABLET | Freq: Two times a day (BID) | ORAL | 3 refills | Status: DC
Start: 2021-05-04 — End: 2022-03-12

## 2021-05-04 MED ORDER — WARFARIN SODIUM 5 MG PO TABS: ORAL_TABLET | ORAL | 1 refills | Status: DC

## 2021-05-12 ENCOUNTER — Ambulatory Visit (INDEPENDENT_AMBULATORY_CARE_PROVIDER_SITE_OTHER): Payer: Medicare Other

## 2021-05-12 ENCOUNTER — Other Ambulatory Visit: Payer: Self-pay

## 2021-05-12 DIAGNOSIS — Z5181 Encounter for therapeutic drug level monitoring: Secondary | ICD-10-CM | POA: Diagnosis not present

## 2021-05-12 DIAGNOSIS — Z7901 Long term (current) use of anticoagulants: Secondary | ICD-10-CM | POA: Diagnosis not present

## 2021-05-12 DIAGNOSIS — I480 Paroxysmal atrial fibrillation: Secondary | ICD-10-CM

## 2021-05-12 LAB — POCT INR: INR: 2.4 (ref 2.0–3.0)

## 2021-05-12 NOTE — Patient Instructions (Signed)
Description   Continue 1/2 tablet everyday except 1 tablet on Mondays and Fridays.  Repeat INR in 6 weeks..   Call [phone] if for questions

## 2021-05-23 ENCOUNTER — Ambulatory Visit (INDEPENDENT_AMBULATORY_CARE_PROVIDER_SITE_OTHER): Payer: Medicare Other

## 2021-05-23 DIAGNOSIS — I495 Sick sinus syndrome: Secondary | ICD-10-CM

## 2021-05-23 LAB — CUP PACEART REMOTE DEVICE CHECK
Battery Remaining Longevity: 137 mo
Battery Voltage: 3.03 V
Brady Statistic AP VP Percent: 0 %
Brady Statistic AP VS Percent: 0 %
Brady Statistic AS VP Percent: 42.43 %
Brady Statistic AS VS Percent: 57.57 %
Brady Statistic RA Percent Paced: 0 %
Brady Statistic RV Percent Paced: 42.43 %
Date Time Interrogation Session: 20220906015102
Implantable Lead Implant Date: 20120229
Implantable Lead Implant Date: 20120229
Implantable Lead Location: 753859
Implantable Lead Location: 753860
Implantable Pulse Generator Implant Date: 20200605
Lead Channel Impedance Value: 342 Ohm
Lead Channel Impedance Value: 418 Ohm
Lead Channel Impedance Value: 494 Ohm
Lead Channel Impedance Value: 551 Ohm
Lead Channel Pacing Threshold Amplitude: 0.75 V
Lead Channel Pacing Threshold Amplitude: 0.875 V
Lead Channel Pacing Threshold Pulse Width: 0.4 ms
Lead Channel Pacing Threshold Pulse Width: 0.4 ms
Lead Channel Sensing Intrinsic Amplitude: 0.5 mV
Lead Channel Sensing Intrinsic Amplitude: 0.5 mV
Lead Channel Sensing Intrinsic Amplitude: 15.75 mV
Lead Channel Sensing Intrinsic Amplitude: 15.75 mV
Lead Channel Setting Pacing Amplitude: 2.5 V
Lead Channel Setting Pacing Pulse Width: 0.4 ms
Lead Channel Setting Sensing Sensitivity: 2 mV

## 2021-06-01 NOTE — Progress Notes (Signed)
Remote pacemaker transmission.   

## 2021-06-23 ENCOUNTER — Other Ambulatory Visit: Payer: Self-pay

## 2021-06-23 ENCOUNTER — Ambulatory Visit: Payer: Medicare Other

## 2021-06-23 DIAGNOSIS — I480 Paroxysmal atrial fibrillation: Secondary | ICD-10-CM

## 2021-06-23 DIAGNOSIS — Z7901 Long term (current) use of anticoagulants: Secondary | ICD-10-CM | POA: Diagnosis not present

## 2021-06-23 LAB — POCT INR: INR: 2.7 (ref 2.0–3.0)

## 2021-06-23 NOTE — Patient Instructions (Signed)
Continue 1/2 tablet everyday except 1 tablet on Mondays and Fridays.  Repeat INR in 6 weeks..   Call [phone] if for questions

## 2021-08-04 ENCOUNTER — Ambulatory Visit: Payer: Medicare Other

## 2021-08-04 ENCOUNTER — Other Ambulatory Visit: Payer: Self-pay

## 2021-08-04 DIAGNOSIS — I480 Paroxysmal atrial fibrillation: Secondary | ICD-10-CM

## 2021-08-04 DIAGNOSIS — Z7901 Long term (current) use of anticoagulants: Secondary | ICD-10-CM

## 2021-08-04 LAB — POCT INR: INR: 2.3 (ref 2.0–3.0)

## 2021-08-04 NOTE — Patient Instructions (Signed)
Continue 1/2 tablet everyday except 1 tablet on Mondays and Fridays.  Repeat INR in 7 weeks..   Call [phone] if for questions

## 2021-08-22 ENCOUNTER — Ambulatory Visit (INDEPENDENT_AMBULATORY_CARE_PROVIDER_SITE_OTHER): Payer: Medicare Other

## 2021-08-22 DIAGNOSIS — I495 Sick sinus syndrome: Secondary | ICD-10-CM

## 2021-08-22 LAB — CUP PACEART REMOTE DEVICE CHECK
Battery Remaining Longevity: 136 mo
Battery Voltage: 3.03 V
Brady Statistic AP VP Percent: 0 %
Brady Statistic AP VS Percent: 0 %
Brady Statistic AS VP Percent: 40.68 %
Brady Statistic AS VS Percent: 59.32 %
Brady Statistic RA Percent Paced: 0 %
Brady Statistic RV Percent Paced: 40.68 %
Date Time Interrogation Session: 20221206004618
Implantable Lead Implant Date: 20120229
Implantable Lead Implant Date: 20120229
Implantable Lead Location: 753859
Implantable Lead Location: 753860
Implantable Pulse Generator Implant Date: 20200605
Lead Channel Impedance Value: 342 Ohm
Lead Channel Impedance Value: 475 Ohm
Lead Channel Impedance Value: 532 Ohm
Lead Channel Impedance Value: 551 Ohm
Lead Channel Pacing Threshold Amplitude: 0.75 V
Lead Channel Pacing Threshold Amplitude: 0.75 V
Lead Channel Pacing Threshold Pulse Width: 0.4 ms
Lead Channel Pacing Threshold Pulse Width: 0.4 ms
Lead Channel Sensing Intrinsic Amplitude: 0.5 mV
Lead Channel Sensing Intrinsic Amplitude: 0.5 mV
Lead Channel Sensing Intrinsic Amplitude: 17.125 mV
Lead Channel Sensing Intrinsic Amplitude: 17.125 mV
Lead Channel Setting Pacing Amplitude: 2.5 V
Lead Channel Setting Pacing Pulse Width: 0.4 ms
Lead Channel Setting Sensing Sensitivity: 2 mV

## 2021-08-31 NOTE — Progress Notes (Signed)
Remote pacemaker transmission.   

## 2021-09-22 ENCOUNTER — Other Ambulatory Visit: Payer: Self-pay

## 2021-09-22 ENCOUNTER — Ambulatory Visit: Payer: Medicare Other

## 2021-09-22 DIAGNOSIS — Z7901 Long term (current) use of anticoagulants: Secondary | ICD-10-CM | POA: Diagnosis not present

## 2021-09-22 DIAGNOSIS — I480 Paroxysmal atrial fibrillation: Secondary | ICD-10-CM

## 2021-09-22 LAB — POCT INR: INR: 2.4 (ref 2.0–3.0)

## 2021-09-22 NOTE — Patient Instructions (Signed)
Continue 1/2 tablet everyday except 1 tablet on Mondays and Fridays.  Repeat INR in 8 weeks..   Call [phone] if for questions

## 2021-10-06 ENCOUNTER — Other Ambulatory Visit: Payer: Self-pay | Admitting: Cardiovascular Disease

## 2021-10-21 ENCOUNTER — Other Ambulatory Visit: Payer: Self-pay | Admitting: Cardiovascular Disease

## 2021-11-17 ENCOUNTER — Ambulatory Visit: Payer: Medicare Other

## 2021-11-17 ENCOUNTER — Other Ambulatory Visit: Payer: Self-pay

## 2021-11-17 DIAGNOSIS — Z7901 Long term (current) use of anticoagulants: Secondary | ICD-10-CM | POA: Diagnosis not present

## 2021-11-17 DIAGNOSIS — I480 Paroxysmal atrial fibrillation: Secondary | ICD-10-CM | POA: Diagnosis not present

## 2021-11-17 LAB — POCT INR: INR: 2.9 (ref 2.0–3.0)

## 2021-11-17 NOTE — Patient Instructions (Signed)
Continue 1/2 tablet everyday except 1 tablet on Mondays and Fridays.  Repeat INR in 8 weeks..  ? ?Call [phone] if for questions ?

## 2021-11-20 ENCOUNTER — Encounter: Payer: Self-pay | Admitting: Gastroenterology

## 2021-11-20 ENCOUNTER — Ambulatory Visit: Payer: Medicare Other | Admitting: Gastroenterology

## 2021-11-20 VITALS — BP 110/70 | HR 78 | Ht 70.0 in | Wt 215.2 lb

## 2021-11-20 DIAGNOSIS — Z7902 Long term (current) use of antithrombotics/antiplatelets: Secondary | ICD-10-CM

## 2021-11-20 DIAGNOSIS — Z86010 Personal history of colonic polyps: Secondary | ICD-10-CM

## 2021-11-20 DIAGNOSIS — Z7901 Long term (current) use of anticoagulants: Secondary | ICD-10-CM | POA: Diagnosis not present

## 2021-11-20 NOTE — Patient Instructions (Addendum)
If you are age 66 or older, your body mass index should be between 23-30. Your Body mass index is 30.88 kg/m?Marland Kitchen If this is out of the aforementioned range listed, please consider follow up with your Primary Care Provider. ? ?If you are age 42 or younger, your body mass index should be between 19-25. Your Body mass index is 30.88 kg/m?Marland Kitchen If this is out of the aformentioned range listed, please consider follow up with your Primary Care Provider.  ? ?________________________________________________________ ? ?The [facility] providers would like to encourage you to use [facility] to communicate with providers for non-urgent requests or questions.  Due to long hold times on the telephone, sending your provider a message by [facility] may be a faster and more efficient way to get a response.  Please allow 48 business hours for a response.  Please remember that this is for non-urgent requests.  ?_______________________________________________________ ? ?Please call Cardiology to schedule an appointment with Dr. Recardo Evangelist.  Once you have had that appointment, we will determine if your colonoscopy can be performed in our Endoscopy center or will need to be done at [facility]. At that time we will need to request clearance for your to hold your Coumadin and Plavix prior to your procedure. ? ?Thank you for entrusting me with your care and for choosing [employer], ?Dr. [place] Cellar ? ? ?

## 2021-11-20 NOTE — Progress Notes (Signed)
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  He was last seen in 2018 for colonoscopy.  Recall that his mother had colon cancer diagnosed at age 55s.  He had a colonoscopy with me in October 2018, he had 7 polyps removed at that time, largest 12 mm in size, combination of adenomas and sessile serrated polyps.  He tolerated the procedure well.  He denies any problems with his bowels since of last seen him.  States his bowels are fairly regular.  Has roughly 1 bowel movement per day.  Denies any abdominal pains at baseline.  He does have a significant cardiac history as outlined above.  He is followed by cardiology, had last visit in April 2022 with his cardiologist Dr. Sallyanne Kuster.  Had a low risk nuclear stress test last April.  He does have periodic chest pains that bother him sporadically, perhaps once per month if not less, for which he uses nitroglycerin for relief.  He has occasional dyspnea on exertion if he is exerting himself.  He does take Plavix chronically as well as Coumadin.  He states he is on Protonix for prophylactic purposes in light of his anticoagulation.  Denies any reflux symptoms that bother him.  No family history of esophagus cancer.  He is accompanied by his wife today who answers most of his questions.  He is otherwise on Ranexa.  Colonoscopy 06/26/2017 - One 3 mm polyp in the cecum, removed with a cold snare. Resected and retrieved. - Three 3 to 12 mm polyps in the ascending colon, removed with a cold snare. Resected and retrieved. - One 5 mm polyp in the transverse colon, removed with a cold snare. Resected and retrieved. - Diverticulosis in the sigmoid colon. - One 3 mm polyp at the recto-sigmoid colon, removed with  a cold snare. Complete resection. Polyp tissue not retrieved. - One 8-10 mm polyp at the recto-sigmoid colon, removed with a hot snare. Resected and retrieved. Clip was placed. - Internal hemorrhoids. - The examination was otherwise normal.   1. Surgical [P], ascending, transverse, cecum, polyp (5) - TUBULAR ADENOMA (FIVE FRAGMENTS). - SESSILE SERRATED POLYP (THREE FRAGMENTS). - NO HIGH GRADE DYSPLASIA OR MALIGNANCY. 2. Surgical [P], rectosigmoid polyps - TUBULAR ADENOMA (ONE FRAGMENT). - NO HIGH GRADE DYSPLASIA OR MALIGNANCY.  Repeat 3 years   Echocardiogram 02/10/20 - EF 55-60%, mild MR  Cardiac cath 02/09/20 - . Severe native vessel CAD with mild in-stent restenosis of the left main/left circumflex stent, continued patency of the stented segment in the OM branch of the circumflex 2. Chronic RCA occlusion with chronic occlusion of the SVG-RCA 3. Continued patency of the SVG-ramus intermedius, SVG-diagonal, and LIMA-LAD 4. Normal LVEDP   Recommend: continued medical therapy. Ok to resume warfarin tonight. Continue clopidogrel long term   Nuclear stress test 01/10/21:  EKG not interpretable due to ventricular paced rhythm The left ventricular ejection fraction is mildly decreased (45-54%). Nuclear stress EF: 53%. Defect 1: There is a medium defect of mild severity present in the basal inferior, mid inferior and apical inferior location. Defect 2: There is a small defect of mild severity present in the apex location. Findings consistent with prior myocardial infarction. This is a low risk study.   1. Fixed perfusion defect at apex with hypokinesis consistent with  infarct 2. Fixed inferior perfusion defect with normal wall motion suggests artifact 3. Low risk study.  No ischemia.  Past Medical History:  Diagnosis Date   Anxiety    Atrial fibrillation (HCC)    paroxysmal   Atrial flutter ([place])    Coagulopathy ([place])    persistent elevation of ACT during 10/17/11  hospitalization   Coronary artery disease    s/p CABG 2010, s/p Successful PCI  10/17/11 of Distal Left Main and the Left Circumflex with a Promus Element DES stent -- 4.0 mm x 16 mm postdilated to 4.12 mm    DDD (degenerative disc disease)    Depression    Dysrhythmia    afib   Facial asymmetry 2010   pt. stated it was very temporary, never seen by doctor for it.    GERD (gastroesophageal reflux disease)    Heart murmur    Hyperlipidemia    Hypertension    Kidney stones    Presence of permanent cardiac pacemaker    Medtronic    S/P coronary artery stent placement to distal Lt. main and Lt. Cx 10/17/11   Tachycardia-bradycardia syndrome ([place]) 2/12   s/p PPM (MDT) by Dr Blanch Media     Past Surgical History:  Procedure Laterality Date   ATRIAL FIBRILLATION ABLATION  04/24/12, 12/02/12   PVI x2 and CTI ablation by Dr Rayann Heman   ATRIAL FIBRILLATION ABLATION N/A 04/24/2012   Procedure: ATRIAL FIBRILLATION ABLATION;  Surgeon: Thompson Grayer, MD;  Location: [facility] CATH LAB;  Service: Cardiovascular;  Laterality: N/A;   ATRIAL FIBRILLATION ABLATION N/A 12/02/2012   Procedure: ATRIAL FIBRILLATION ABLATION;  Surgeon: Thompson Grayer, MD;  Location: [facility] CATH LAB;  Service: Cardiovascular;  Laterality: N/A;   CORONARY ANGIOPLASTY  10/19/11   CORONARY ARTERY BYPASS GRAFT  2010   CORONARY STENT INTERVENTION N/A 04/13/2019   Procedure: CORONARY STENT INTERVENTION;  Surgeon: Nelva Bush, MD;  Location: [place] CV LAB;  Service: Cardiovascular;  Laterality: N/A;   CORONARY STENT INTERVENTION N/A 07/28/2019   Procedure: CORONARY STENT INTERVENTION;  Surgeon: Jettie Booze, MD;  Location: [place] CV LAB;  Service: Cardiovascular;  Laterality: N/A;   CYSTOSCOPY  2007 ?   treatment for Kidney stones    INGUINAL HERNIA REPAIR Left 01/04/2016   Procedure: LAPAROSCOPIC LEFT INGUINAL HERNIA WITH MESH;  Surgeon: Ralene Ok, MD;  Location: [place];  Service: General;  Laterality: Left;   INSERT / REPLACE /  REMOVE PACEMAKER  2/12   Medtronic   INSERTION OF MESH Left 01/04/2016   Procedure: INSERTION OF MESH;  Surgeon: Ralene Ok, MD;  Location: [place];  Service: General;  Laterality: Left;   LAPAROSCOPIC LYSIS OF ADHESIONS Left 01/04/2016   Procedure: LAPAROSCOPIC LYSIS OF ADHESIONS;  Surgeon: Ralene Ok, MD;  Location: [place];  Service: General;  Laterality: Left;   LEFT HEART CATH AND CORS/GRAFTS ANGIOGRAPHY N/A 02/20/2019   Procedure: LEFT HEART CATH AND CORS/GRAFTS ANGIOGRAPHY;  Surgeon: Belva Crome, MD;  Location: [place] CV LAB;  Service: Cardiovascular;  Laterality: N/A;   LEFT HEART CATH AND CORS/GRAFTS ANGIOGRAPHY N/A 04/13/2019   Procedure: LEFT HEART CATH AND CORS/GRAFTS ANGIOGRAPHY;  Surgeon: Nelva Bush, MD;  Location: [place] CV LAB;  Service: Cardiovascular;  Laterality: N/A;   LEFT HEART CATH AND CORS/GRAFTS ANGIOGRAPHY N/A 07/28/2019   Procedure: LEFT HEART CATH AND CORS/GRAFTS ANGIOGRAPHY;  Surgeon: Jettie Booze, MD;  Location: [place] CV LAB;  Service: Cardiovascular;  Laterality: N/A;   LEFT HEART CATH AND CORS/GRAFTS  ANGIOGRAPHY N/A 02/09/2020   Procedure: LEFT HEART CATH AND CORS/GRAFTS ANGIOGRAPHY;  Surgeon: Sherren Mocha, MD;  Location: [place] CV LAB;  Service: Cardiovascular;  Laterality: N/A;   LEFT HEART CATHETERIZATION WITH CORONARY/GRAFT ANGIOGRAM N/A 10/17/2011   Procedure: LEFT HEART CATHETERIZATION WITH Beatrix Fetters;  Surgeon: [name], MD;  Location: [place] CATH LAB;  Service: Cardiovascular;  Laterality: N/A;   LEFT HEART CATHETERIZATION WITH CORONARY/GRAFT ANGIOGRAM N/A 02/08/2012   Procedure: LEFT HEART CATHETERIZATION WITH Beatrix Fetters;  Surgeon: [name], MD;  Location: [place] CATH LAB;  Service: Cardiovascular;  Laterality: N/A;   NM MYOCAR PERF WALL MOTION  03/23/2011   Low risk   PERCUTANEOUS CORONARY STENT INTERVENTION (PCI-S)  10/17/2011   Procedure: PERCUTANEOUS CORONARY STENT INTERVENTION  (PCI-S);  Surgeon: [name], MD;  Location: [facility] CATH LAB;  Service: Cardiovascular;;   PPM GENERATOR CHANGEOUT N/A 02/20/2019   Procedure: PPM GENERATOR CHANGEOUT;  Surgeon: [name], MD;  Location: [place] CV LAB;  Service: Cardiovascular;  Laterality: N/A;   TEE WITHOUT CARDIOVERSION  04/23/2012   Procedure: TRANSESOPHAGEAL ECHOCARDIOGRAM (TEE);  Surgeon: Josue Hector, MD;  Location: [facility] ENDOSCOPY;  Service: Cardiovascular;  Laterality: N/A;  spoke to pt about time change from 11a to 12p/DL   TEE WITHOUT CARDIOVERSION N/A 12/01/2012   Procedure: TRANSESOPHAGEAL ECHOCARDIOGRAM (TEE);  Surgeon: [name], MD;  Location: [facility] ENDOSCOPY;  Service: Cardiovascular;  Laterality: N/A;   Family History  Problem Relation Age of Onset   Colon cancer Mother    Alzheimer's disease Father    Heart disease Father    Hypertension Sister    Stroke Brother    Hypertension Brother    Heart attack Brother    Heart disease Brother    Hypertension Brother    Diabetes Brother    Coronary artery disease Other    Stomach cancer Neg Hx    Pancreatic cancer Neg Hx    Social History   Tobacco Use   Smoking status: Former    Years: 1.00    Types: Cigarettes    Quit date: 12/18/1963    Years since quitting: 57.9   Smokeless tobacco: Former    Quit date: 10/03/2012   Tobacco comments:    quit smoking in 1970's  Vaping Use   Vaping Use: Never used  Substance Use Topics   Alcohol use: Not Currently    Comment: rare   Drug use: Yes    Types: Marijuana    Comment: daily   Current Outpatient Medications  Medication Sig Dispense Refill   acetaminophen (TYLENOL) 500 MG tablet Take 1,000 mg by mouth every 6 (six) hours as needed for moderate pain.     clonazePAM (KLONOPIN) 0.5 MG tablet Take 1 tablet (0.5 mg total) by mouth 2 (two) times daily as needed for anxiety. 15 tablet 0   clopidogrel (PLAVIX) 75 MG tablet Take 1 tablet (75 mg total) by mouth daily. Patient need a appointment for  future refills. 90 tablet 0   diltiazem (DILT-XR) 180 MG 24 hr capsule Take 1 capsule (180 mg total) by mouth daily. Patient need a appointment for future refills. 90 capsule 0   Doxylamine Succinate, Sleep, (SLEEP AID PO) Take 1 tablet by mouth at bedtime as needed (sleep).     fish oil-omega-3 fatty acids 1000 MG capsule Take 2 g by mouth daily.      isosorbide mononitrate (IMDUR) 60 MG 24 hr tablet Take 1 tablet (60 mg total) by mouth 2 (two) times daily. 180 tablet  2   lisinopril (ZESTRIL) 20 MG tablet Take 1 tablet (20 mg total) by mouth daily. 90 tablet 2   metFORMIN (GLUCOPHAGE) 500 MG tablet Take 1 tablet (500 mg total) by mouth 2 (two) times daily with a meal. (Patient taking differently: Take 1,000 mg by mouth 2 (two) times daily with a meal.) 60 tablet 0   metoprolol tartrate (LOPRESSOR) 100 MG tablet Take 100 mg by mouth 2 (two) times daily.     nitroGLYCERIN (NITROSTAT) 0.4 MG SL tablet Take one tablet as needed for chest pain, can take up to three times 5 min apart after 3 tablets and no relief call 911. 25 tablet 0   pantoprazole (PROTONIX) 40 MG tablet TAKE 1 TABLET BY MOUTH  DAILY 90 tablet 0   ranolazine (RANEXA) 500 MG 12 hr tablet Take 1 tablet (500 mg total) by mouth 2 (two) times daily. 180 tablet 3   rosuvastatin (CRESTOR) 40 MG tablet TAKE 1 TABLET BY MOUTH ONCE DAILY (Patient taking differently: Take 40 mg by mouth daily.) 90 tablet 3   venlafaxine XR (EFFEXOR-XR) 150 MG 24 hr capsule TAKE ONE CAPSULE BY MOUTH ONCE DAILY WITH  THE  75  MG  TO  TOTAL  225  MG  DAILY (Patient taking differently: Take 150 mg by mouth daily.) 90 capsule 3   venlafaxine XR (EFFEXOR-XR) 75 MG 24 hr capsule TAKE ONE CAPSULE BY MOUTH ONCE DAILY TAKE  WITH  '150MG'$   TO  EQUAL  A  TOTAL  OF  '225MG'$   DAILY (Patient taking differently: Take 75 mg by mouth at bedtime.) 90 capsule 3   warfarin (COUMADIN) 5 MG tablet TAKE 1/2 TO 1 TABLET BY  MOUTH DAILY AS DIRECTED BY  COUMADIN CLINIC 90 tablet 3   potassium  chloride (KLOR-CON) 10 MEQ tablet Take 1 tablet (10 mEq total) by mouth once for 1 dose. 90 tablet 2   No current facility-administered medications for this visit.   No Known Allergies   Review of Systems: All systems reviewed and negative except where noted in HPI.    Lab Results  Component Value Date   WBC 6.5 02/10/2020   HGB 13.5 02/10/2020   HCT 41.1 02/10/2020   MCV 86.3 02/10/2020   PLT 167 02/10/2020    Lab Results  Component Value Date   CREATININE 0.79 01/10/2021   BUN 21 01/10/2021   NA 137 01/10/2021   K 4.6 01/10/2021   CL 99 01/10/2021   CO2 20 01/10/2021    Lab Results  Component Value Date   ALT 13 01/10/2021   AST 19 01/10/2021   ALKPHOS 92 01/10/2021   BILITOT 0.6 01/10/2021     Physical Exam: BP 110/70    Pulse 78    Ht '5\' 10"'$  (1.778 m)    Wt 215 lb 3.2 oz (97.6 kg)    SpO2 98%    BMI 30.88 kg/m  Constitutional: Pleasant,well-developed, male in no acute distress. HEENT: Normocephalic and atraumatic. Conjunctivae are normal. No scleral icterus. Neck supple.  Cardiovascular: Normal rate, regular rhythm.  Pulmonary/chest: Effort normal and breath sounds normal. Abdominal: Soft, nondistended, nontender. There are no masses palpable.  Extremities: no edema Lymphadenopathy: No cervical adenopathy noted. Neurological: Alert and oriented to person place and time. Skin: Skin is warm and dry. No rashes noted. Psychiatric: Normal mood and affect. Behavior is normal.   ASSESSMENT AND PLAN: 66 y/o male here to re-establish care for the following:  History of colon polyps Anticoagulated Anti-platelet use  As above, significant cardiac history with CAD, A-fib, tachybradycardia syndrome with pacemaker in place, on Plavix, Coumadin, Ranexa.  Generally has been doing pretty well since I have last seen him, had a stress test about 1 year ago which was considered to be a lower risk exam with fixed defects.  He has not seen his cardiologist in almost a  year.  He does warrant a colonoscopy for advanced adenomas on his last exam, he is overdue for this, fortunately no bowel symptoms. We discussed risks / benefits of colonoscopy and anesthesia. I think it best that he see his cardiologist for an office visit before he schedule a procedure with [place], given he is using nitroglycerin periodically, ensure he does not warrant any further cardiac evaluation pre-anesthesia.  He will also need clearance to hold his Plavix and Coumadin for 5 days, and determine if he warrants bridging for this.  We will await his cardiology evaluation prior to scheduling his procedures with [place].  They will contact Dr. Sallyanne Kuster to schedule, and contact our office once that visit has been done.  We will keep an eye on his chart and await that consultation and schedule accordingly pending results.  They agree with the plan as outlined, all questions answered  Jolly Mango, MD [facility]  CC: Fabio Neighbors, NP

## 2021-11-21 ENCOUNTER — Ambulatory Visit (INDEPENDENT_AMBULATORY_CARE_PROVIDER_SITE_OTHER): Payer: Medicare Other

## 2021-11-21 DIAGNOSIS — I495 Sick sinus syndrome: Secondary | ICD-10-CM

## 2021-11-21 LAB — CUP PACEART REMOTE DEVICE CHECK
Battery Remaining Longevity: 133 mo
Battery Voltage: 3.02 V
Brady Statistic AP VP Percent: 0 %
Brady Statistic AP VS Percent: 0 %
Brady Statistic AS VP Percent: 42.32 %
Brady Statistic AS VS Percent: 57.68 %
Brady Statistic RA Percent Paced: 0 %
Brady Statistic RV Percent Paced: 42.32 %
Date Time Interrogation Session: 20230307004811
Implantable Lead Implant Date: 20120229
Implantable Lead Implant Date: 20120229
Implantable Lead Location: 753859
Implantable Lead Location: 753860
Implantable Pulse Generator Implant Date: 20200605
Lead Channel Impedance Value: 342 Ohm
Lead Channel Impedance Value: 475 Ohm
Lead Channel Impedance Value: 532 Ohm
Lead Channel Impedance Value: 551 Ohm
Lead Channel Pacing Threshold Amplitude: 0.75 V
Lead Channel Pacing Threshold Amplitude: 0.75 V
Lead Channel Pacing Threshold Pulse Width: 0.4 ms
Lead Channel Pacing Threshold Pulse Width: 0.4 ms
Lead Channel Sensing Intrinsic Amplitude: 0.5 mV
Lead Channel Sensing Intrinsic Amplitude: 0.5 mV
Lead Channel Sensing Intrinsic Amplitude: 24.75 mV
Lead Channel Sensing Intrinsic Amplitude: 24.75 mV
Lead Channel Setting Pacing Amplitude: 2.5 V
Lead Channel Setting Pacing Pulse Width: 0.4 ms
Lead Channel Setting Sensing Sensitivity: 2 mV

## 2021-11-30 ENCOUNTER — Encounter: Payer: Self-pay | Admitting: Physician Assistant

## 2021-11-30 ENCOUNTER — Telehealth: Payer: Self-pay

## 2021-11-30 ENCOUNTER — Ambulatory Visit: Payer: Medicare Other | Admitting: Physician Assistant

## 2021-11-30 ENCOUNTER — Other Ambulatory Visit: Payer: Self-pay

## 2021-11-30 VITALS — BP 128/68 | HR 92 | Ht 70.0 in | Wt 219.0 lb

## 2021-11-30 DIAGNOSIS — Z950 Presence of cardiac pacemaker: Secondary | ICD-10-CM | POA: Diagnosis not present

## 2021-11-30 DIAGNOSIS — Z01810 Encounter for preprocedural cardiovascular examination: Secondary | ICD-10-CM | POA: Diagnosis not present

## 2021-11-30 DIAGNOSIS — I480 Paroxysmal atrial fibrillation: Secondary | ICD-10-CM | POA: Diagnosis not present

## 2021-11-30 DIAGNOSIS — I25708 Atherosclerosis of coronary artery bypass graft(s), unspecified, with other forms of angina pectoris: Secondary | ICD-10-CM | POA: Diagnosis not present

## 2021-11-30 MED ORDER — NITROGLYCERIN 0.4 MG SL SUBL
SUBLINGUAL_TABLET | SUBLINGUAL | 2 refills | Status: AC
Start: 2021-11-30 — End: ?

## 2021-11-30 MED ORDER — DILTIAZEM HCL ER 180 MG PO CP24: 180.0000 mg | ORAL_CAPSULE | Freq: Every day | ORAL | 3 refills | Status: DC

## 2021-11-30 MED ORDER — CLOPIDOGREL BISULFATE 75 MG PO TABS: 75.0000 mg | ORAL_TABLET | Freq: Every day | ORAL | 3 refills | Status: DC

## 2021-11-30 NOTE — Patient Instructions (Signed)
Medication Instructions:  ?No changes ?*If you need a refill on your cardiac medications before your next appointment, please call your pharmacy* ? ? ?Lab Work: ?None ordered ?If you have labs (blood work) drawn today and your tests are completely normal, you will receive your results only by: ?MyChart Message (if you have MyChart) OR ?A paper copy in the mail ?If you have any lab test that is abnormal or we need to change your treatment, we will call you to review the results. ? ? ?Testing/Procedures: ?None ordered ? ? ?Follow-Up: ?At [facility], you and your health needs are our priority.  As part of our continuing mission to provide you with exceptional heart care, we have created designated Provider Care Teams.  These Care Teams include your primary Cardiologist (physician) and Advanced Practice Providers (APPs -  Physician Assistants and Nurse Practitioners) who all work together to provide you with the care you need, when you need it. ? ?We recommend signing up for the patient portal called "MyChart".  Sign up information is provided on this After Visit Summary.  MyChart is used to connect with patients for Virtual Visits (Telemedicine).  Patients are able to view lab/test results, encounter notes, upcoming appointments, etc.  Non-urgent messages can be sent to your provider as well.   ?To learn more about what you can do with MyChart, go to [email or web address].   ? ?Your next appointment:   ?Keep your 03/12/22 at 9:40 with Dr. Sallyanne Kuster ? ? ?

## 2021-11-30 NOTE — Progress Notes (Addendum)
?Cardiology Office Note:   ? ?Date:  11/30/2021  ? ?ID:  Lucas Porter, DOB Sep 11, 1956, MRN [id number] ? ?PCP:  Fabio Neighbors, NP ?  ?[facility] Providers ?Cardiologist:  [name], MD    ? ?Referring MD: Fabio Neighbors, NP  ? ?Chief Complaint  ?Patient presents with  ? Pre-op Exam  ?  Pending colonoscopy  ? ? ?History of Present Illness:   ? ?Lucas Porter is a 66 y.o. 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  Since his CABG, he has underwent PCI-DES to left main and the left circumflex artery in 2013.  He also underwent PCI-DES to SVG-diagonal and the native left circumflex artery in July 2020.  In November 2020, he underwent 2 separate DES to OM 3 and proximal left circumflex.  His last cardiac catheterization performed on 02/09/2020 showed 100% occluded distal left main, 100% ostial D1 occlusion, 90% ostial OM 2 lesion, widely patent OM 3 stent and proximal left circumflex stent, 100% ostial ramus occlusion, patent LIMA to LAD, SVG to ramus, and SVG to diagonal, chronically occluded SVG-RCA with chronic ostial RCA occlusion.  Medical therapy was recommended. ? ?Patient was last seen by Dr. Sallyanne Kuster on 12/27/2020 at which time he complained of a prolonged episode of chest pain 2 weeks prior.  Subsequent Myoview obtained on 01/10/2021 showed EF 53%, medium defect of mild severity present in the basal inferior, mid inferior and apical inferior location which is fixed, there was also a fixed small defect of mild severity in the apex location, no reversible ischemia, overall low risk study. ? ?Patient presents today for preoperative clearance prior to colonoscopy.  EKG shows he is back in A-fib.  This is not surprising since the previous device interrogation showed he is going in and out of A-fib.  He has no cardiac awareness while being in atrial fibrillation.  In  the past several months, he has been noticing a left arm pain after walking uphill to the barn [place]door.  He denies any chest pain.  The symptoms sounds like stable angina.  I discussed with our clinical pharmacist, if he were to undergo colonoscopy, he may hold Coumadin for 5 days prior to the procedure with without Lovenox bridging.  Given the presence of stable angina, I will discuss his case with Dr. Sallyanne Kuster to see if he may proceed with the procedure while holding Plavix.  The remaining question is whether the patient should do the colonoscopy in the hospital versus a stand-alone GI center.  I will discuss his case with Dr. Sallyanne Kuster and call him around lunchtime. ? ?Addendum: I discussed the case with Dr. Sallyanne Kuster, he likely has stable angina at this point.  However this should not interfere with his colonoscopy procedure.  He is cleared to proceed with colonoscopy.  We will increase his metoprolol tartrate to 150 mg twice a day for antianginal purposes.  He may hold Coumadin and Plavix for 5 days prior to the procedure and restart both as soon as possible afterward at the GI doctor's discretion. ? ?Past Medical History:  ?Diagnosis Date  ? Anxiety   ? Atrial fibrillation ([place])   ? paroxysmal  ? Atrial flutter ([place])   ? Coagulopathy ([place])   ? persistent elevation of ACT during 10/17/11 hospitalization  ? Coronary artery disease   ? s/p CABG 2010, s/p Successful PCI  10/17/11  of Distal Left Main and the Left Circumflex with a Promus Element DES stent -- 4.0 mm x 16 mm postdilated to 4.12 mm   ? DDD (degenerative disc disease)   ? Depression   ? Dysrhythmia   ? afib  ? Facial asymmetry 2010  ? pt. stated it was very temporary, never seen by doctor for it.   ? GERD (gastroesophageal reflux disease)   ? Heart murmur   ? Hyperlipidemia   ? Hypertension   ? Kidney stones   ? Presence of permanent cardiac pacemaker   ? Medtronic   ? S/P coronary artery stent placement to distal Lt. main and Lt. Cx 10/17/11  ?  Tachycardia-bradycardia syndrome ([place]) 2/12  ? s/p PPM (MDT) by Dr Blanch Media  ? ? ?Past Surgical History:  ?Procedure Laterality Date  ? ATRIAL FIBRILLATION ABLATION  04/24/12, 12/02/12  ? PVI x2 and CTI ablation by Dr Rayann Heman  ? ATRIAL FIBRILLATION ABLATION N/A 04/24/2012  ? Procedure: ATRIAL FIBRILLATION ABLATION;  Surgeon: Thompson Grayer, MD;  Location[facility] CATH LAB;  Service: Cardiovascular;  Laterality: N/A;  ? ATRIAL FIBRILLATION ABLATION N/A 12/02/2012  ? Procedure: ATRIAL FIBRILLATION ABLATION;  Surgeon: Thompson Grayer, MD;  Location: [facility] CATH LAB;  Service: Cardiovascular;  Laterality: N/A;  ? CORONARY ANGIOPLASTY  10/19/11  ? CORONARY ARTERY BYPASS GRAFT  2010  ? CORONARY STENT INTERVENTION N/A 04/13/2019  ? Procedure: CORONARY STENT INTERVENTION;  Surgeon: Nelva Bush, MD;  Location: [place] CV LAB;  Service: Cardiovascular;  Laterality: N/A;  ? CORONARY STENT INTERVENTION N/A 07/28/2019  ? Procedure: CORONARY STENT INTERVENTION;  Surgeon: Jettie Booze, MD;  Location: [place] CV LAB;  Service: Cardiovascular;  Laterality: N/A;  ? CYSTOSCOPY  2007 ?  ? treatment for Kidney stones   ? INGUINAL HERNIA REPAIR Left 01/04/2016  ? Procedure: LAPAROSCOPIC LEFT INGUINAL HERNIA WITH MESH;  Surgeon: Ralene Ok, MD;  Location: [place];  Service: General;  Laterality: Left;  ? INSERT / REPLACE / REMOVE PACEMAKER  2/12  ? Medtronic  ? INSERTION OF MESH Left 01/04/2016  ? Procedure: INSERTION OF MESH;  Surgeon: Ralene Ok, MD;  Location: [place];  Service: General;  Laterality: Left;  ? LAPAROSCOPIC LYSIS OF ADHESIONS Left 01/04/2016  ? Procedure: LAPAROSCOPIC LYSIS OF ADHESIONS;  Surgeon: Ralene Ok, MD;  Location: [place];  Service: General;  Laterality: Left;  ? LEFT HEART CATH AND CORS/GRAFTS ANGIOGRAPHY N/A 02/20/2019  ? Procedure: LEFT HEART CATH AND CORS/GRAFTS ANGIOGRAPHY;  Surgeon: Belva Crome, MD;  Location: [place] CV LAB;  Service: Cardiovascular;  Laterality: N/A;  ? LEFT HEART CATH AND  CORS/GRAFTS ANGIOGRAPHY N/A 04/13/2019  ? Procedure: LEFT HEART CATH AND CORS/GRAFTS ANGIOGRAPHY;  Surgeon: Nelva Bush, MD;  Location: [place] CV LAB;  Service: Cardiovascular;  Laterality: N/A;  ? LEFT HEART CATH AND CORS/GRAFTS ANGIOGRAPHY N/A 07/28/2019  ? Procedure: LEFT HEART CATH AND CORS/GRAFTS ANGIOGRAPHY;  Surgeon: Jettie Booze, MD;  Location: [name] CV LAB;  Service: Cardiovascular;  Laterality: N/A;  ? LEFT HEART CATH AND CORS/GRAFTS ANGIOGRAPHY N/A 02/09/2020  ? Procedure: LEFT HEART CATH AND CORS/GRAFTS ANGIOGRAPHY;  Surgeon: Sherren Mocha, MD;  Location: [place] CV LAB;  Service: Cardiovascular;  Laterality: N/A;  ? LEFT HEART CATHETERIZATION WITH CORONARY/GRAFT ANGIOGRAM N/A 10/17/2011  ? Procedure: LEFT HEART CATHETERIZATION WITH Beatrix Fetters;  Surgeon: [name], MD;  Location: [facility] CATH LAB;  Service: Cardiovascular;  Laterality: N/A;  ? LEFT HEART CATHETERIZATION WITH CORONARY/GRAFT ANGIOGRAM N/A 02/08/2012  ?  Procedure: LEFT HEART CATHETERIZATION WITH Beatrix Fetters;  Surgeon: [name], MD;  Location: [facility] CATH LAB;  Service: Cardiovascular;  Laterality: N/A;  ? NM MYOCAR PERF WALL MOTION  03/23/2011  ? Low risk  ? PERCUTANEOUS CORONARY STENT INTERVENTION (PCI-S)  10/17/2011  ? Procedure: PERCUTANEOUS CORONARY STENT INTERVENTION (PCI-S);  Surgeon: [name], MD;  Location: [facility] CATH LAB;  Service: Cardiovascular;;  ? PPM GENERATOR CHANGEOUT N/A 02/20/2019  ? Procedure: PPM GENERATOR CHANGEOUT;  Surgeon: [name], MD;  Location: [place] CV LAB;  Service: Cardiovascular;  Laterality: N/A;  ? TEE WITHOUT CARDIOVERSION  04/23/2012  ? Procedure: TRANSESOPHAGEAL ECHOCARDIOGRAM (TEE);  Surgeon: Josue Hector, MD;  Location: [facility] ENDOSCOPY;  Service: Cardiovascular;  Laterality: N/A;  spoke to pt about time change from 11a to 12p/DL  ? TEE WITHOUT CARDIOVERSION N/A 12/01/2012  ? Procedure: TRANSESOPHAGEAL ECHOCARDIOGRAM (TEE);  Surgeon: [name], MD;  Location: [place];  Service: Cardiovascular;  Laterality: N/A;  ? ? ?Current Medications: ?Current Meds  ?Medication Sig  ? acetaminophen (TYLENOL) 500 MG tablet Take 1,000 mg by mouth every 6 (six

## 2021-11-30 NOTE — Telephone Encounter (Signed)
-----   Message from Yetta Flock, MD sent at 11/30/2021  6:19 PM EDT ----- ?Thank you very much. ? ?Jan please see below, cleared for colonoscopy but needs to be done at the hospital. I added him to wait list. Can you let him know we will contact him to be scheduled for sometime in June or July. Thanks ? ?----- Message ----- ?From: Almyra Deforest, PA ?Sent: 11/30/2021   1:23 PM EDT ?To: [name], MD, Yetta Flock, MD ? ?Cardiac clearance ? ?

## 2021-11-30 NOTE — Telephone Encounter (Signed)
Called and Lm for patient to return the call to discuss scheduling of his colonoscopy at the hospital with Dr. Havery Moros. (June / July?)  Sent patient a Pharmacist, community message regarding same ?

## 2021-12-04 NOTE — Progress Notes (Signed)
Remote pacemaker transmission.   

## 2021-12-26 ENCOUNTER — Other Ambulatory Visit: Payer: Self-pay | Admitting: Cardiovascular Disease

## 2022-01-12 ENCOUNTER — Ambulatory Visit: Payer: Medicare Other

## 2022-01-12 DIAGNOSIS — Z7901 Long term (current) use of anticoagulants: Secondary | ICD-10-CM

## 2022-01-12 DIAGNOSIS — I480 Paroxysmal atrial fibrillation: Secondary | ICD-10-CM

## 2022-01-12 LAB — POCT INR: INR: 3.4 — AB (ref 2.0–3.0)

## 2022-01-12 NOTE — Patient Instructions (Signed)
HOLD TOMORROW ONLY and then Continue 1/2 tablet everyday except 1 tablet on Mondays and Fridays.  Repeat INR in 4 weeks..  ? ?Call [phone] if for questions ?

## 2022-01-14 ENCOUNTER — Other Ambulatory Visit: Payer: Self-pay | Admitting: Cardiovascular Disease

## 2022-02-09 ENCOUNTER — Ambulatory Visit: Payer: Medicare Other

## 2022-02-09 DIAGNOSIS — I480 Paroxysmal atrial fibrillation: Secondary | ICD-10-CM | POA: Diagnosis not present

## 2022-02-09 DIAGNOSIS — Z7901 Long term (current) use of anticoagulants: Secondary | ICD-10-CM

## 2022-02-09 LAB — POCT INR: INR: 6.2 — AB (ref 2.0–3.0)

## 2022-02-09 NOTE — Patient Instructions (Signed)
HOLD TOMORROW, Sunday, Monday and TUESDAY ONLY and then decrease to  1/2 tablet everyday except 1 tablet on Wednesday.  Repeat INR in 1 week..   Call [phone] if for questions

## 2022-02-16 ENCOUNTER — Ambulatory Visit: Payer: Medicare Other

## 2022-02-16 DIAGNOSIS — I480 Paroxysmal atrial fibrillation: Secondary | ICD-10-CM | POA: Diagnosis not present

## 2022-02-16 DIAGNOSIS — Z7901 Long term (current) use of anticoagulants: Secondary | ICD-10-CM

## 2022-02-16 LAB — POCT INR: INR: 1.5 — AB (ref 2.0–3.0)

## 2022-02-16 NOTE — Patient Instructions (Signed)
TAKE 1 TABLET TODAY ONLY and then continue  1/2 tablet everyday except 1 tablet on Wednesday.  Repeat INR in 4 weeks..   Call [phone] if for questions

## 2022-02-20 ENCOUNTER — Telehealth: Payer: Self-pay

## 2022-02-20 ENCOUNTER — Other Ambulatory Visit: Payer: Self-pay

## 2022-02-20 ENCOUNTER — Ambulatory Visit (INDEPENDENT_AMBULATORY_CARE_PROVIDER_SITE_OTHER): Payer: Medicare Other

## 2022-02-20 DIAGNOSIS — I495 Sick sinus syndrome: Secondary | ICD-10-CM | POA: Diagnosis not present

## 2022-02-20 DIAGNOSIS — Z86010 Personal history of colonic polyps: Secondary | ICD-10-CM

## 2022-02-20 LAB — CUP PACEART REMOTE DEVICE CHECK
Battery Remaining Longevity: 130 mo
Battery Voltage: 3.02 V
Brady Statistic AP VP Percent: 0 %
Brady Statistic AP VS Percent: 0 %
Brady Statistic AS VP Percent: 50.21 %
Brady Statistic AS VS Percent: 49.79 %
Brady Statistic RA Percent Paced: 0 %
Brady Statistic RV Percent Paced: 50.21 %
Date Time Interrogation Session: 20230606014805
Implantable Lead Implant Date: 20120229
Implantable Lead Implant Date: 20120229
Implantable Lead Location: 753859
Implantable Lead Location: 753860
Implantable Pulse Generator Implant Date: 20200605
Lead Channel Impedance Value: 342 Ohm
Lead Channel Impedance Value: 494 Ohm
Lead Channel Impedance Value: 551 Ohm
Lead Channel Impedance Value: 551 Ohm
Lead Channel Pacing Threshold Amplitude: 0.75 V
Lead Channel Pacing Threshold Amplitude: 0.75 V
Lead Channel Pacing Threshold Pulse Width: 0.4 ms
Lead Channel Pacing Threshold Pulse Width: 0.4 ms
Lead Channel Sensing Intrinsic Amplitude: 0.5 mV
Lead Channel Sensing Intrinsic Amplitude: 0.5 mV
Lead Channel Sensing Intrinsic Amplitude: 19.875 mV
Lead Channel Sensing Intrinsic Amplitude: 19.875 mV
Lead Channel Setting Pacing Amplitude: 2.5 V
Lead Channel Setting Pacing Pulse Width: 0.4 ms
Lead Channel Setting Sensing Sensitivity: 2 mV

## 2022-02-20 MED ORDER — NA SULFATE-K SULFATE-MG SULF 17.5-3.13-1.6 GM/177ML PO SOLN: 1.0000 | Freq: Once | ORAL | 0 refills | Status: AC

## 2022-02-20 NOTE — Telephone Encounter (Signed)
Patients wife returned your call, requesting a call back to discuss appointment and wanting to discuss his blood thinner as well. Please advise.

## 2022-02-20 NOTE — Telephone Encounter (Signed)
Attempted to reach pt to offer him an appt at [facility] on 6/12 for his colonoscopy. I left pt a detailed vm asking that he give me a call back to discuss appt further.

## 2022-02-20 NOTE — Telephone Encounter (Signed)
Returned call to patient's wife. She states that patient is able to come in for his colonoscopy on Monday, 02/26/22 at 9:15 am. Pt's wife is aware that pt will need to arrive at [facility] by 7:45 am with a care partner. Pt's wife is aware that pt's last dose of Plavix/Coumadin will be tonight. She knows that he needs to begin holding Plavix/Coumadin tomorrow, she is aware that they will be notified of when he needs to resume it after the procedure. Pt's wife is aware that I will send his instructions to his MyChart. She knows that pt will need to pick up prep from the pharmacy within the next day or two. Pt's wife verbalized understanding of all information and had no concerns at the end of the call.   Ambulatory referral to GI in epic. SUREP sent to local pharmacy on file.  Colonoscopy instructions sent to patient via MyChart.

## 2022-02-21 ENCOUNTER — Encounter (HOSPITAL_COMMUNITY): Payer: Self-pay | Admitting: Gastroenterology

## 2022-02-21 NOTE — Progress Notes (Signed)
Attempted to obtain medical history via telephone, unable to reach at this time. HIPAA compliant voicemail message left requesting return call to pre surgical testing department. 

## 2022-02-26 ENCOUNTER — Other Ambulatory Visit: Payer: Self-pay

## 2022-02-26 ENCOUNTER — Ambulatory Visit (HOSPITAL_COMMUNITY): Payer: Medicare Other | Admitting: Certified Registered Nurse Anesthetist

## 2022-02-26 ENCOUNTER — Ambulatory Visit (HOSPITAL_COMMUNITY)
Admission: RE | Admit: 2022-02-26 | Discharge: 2022-02-26 | Disposition: A | Discharge status: Home / Self Care | Payer: Medicare Other | Source: Ambulatory Visit | Attending: Gastroenterology | Admitting: Gastroenterology

## 2022-02-26 ENCOUNTER — Encounter (HOSPITAL_COMMUNITY): Payer: Self-pay | Admitting: Gastroenterology

## 2022-02-26 ENCOUNTER — Ambulatory Visit (HOSPITAL_BASED_OUTPATIENT_CLINIC_OR_DEPARTMENT_OTHER): Payer: Medicare Other | Admitting: Certified Registered Nurse Anesthetist

## 2022-02-26 ENCOUNTER — Encounter (HOSPITAL_COMMUNITY)
Admission: RE | Disposition: A | Discharge status: Home / Self Care | Payer: Self-pay | Source: Ambulatory Visit | Attending: Gastroenterology

## 2022-02-26 DIAGNOSIS — E119 Type 2 diabetes mellitus without complications: Secondary | ICD-10-CM | POA: Diagnosis not present

## 2022-02-26 DIAGNOSIS — Z7902 Long term (current) use of antithrombotics/antiplatelets: Secondary | ICD-10-CM | POA: Diagnosis not present

## 2022-02-26 DIAGNOSIS — Z1211 Encounter for screening for malignant neoplasm of colon: Secondary | ICD-10-CM | POA: Insufficient documentation

## 2022-02-26 DIAGNOSIS — I2510 Atherosclerotic heart disease of native coronary artery without angina pectoris: Secondary | ICD-10-CM | POA: Insufficient documentation

## 2022-02-26 DIAGNOSIS — Z955 Presence of coronary angioplasty implant and graft: Secondary | ICD-10-CM | POA: Insufficient documentation

## 2022-02-26 DIAGNOSIS — K219 Gastro-esophageal reflux disease without esophagitis: Secondary | ICD-10-CM | POA: Insufficient documentation

## 2022-02-26 DIAGNOSIS — K5730 Diverticulosis of large intestine without perforation or abscess without bleeding: Secondary | ICD-10-CM | POA: Insufficient documentation

## 2022-02-26 DIAGNOSIS — D126 Benign neoplasm of colon, unspecified: Secondary | ICD-10-CM

## 2022-02-26 DIAGNOSIS — Z951 Presence of aortocoronary bypass graft: Secondary | ICD-10-CM | POA: Diagnosis not present

## 2022-02-26 DIAGNOSIS — K648 Other hemorrhoids: Secondary | ICD-10-CM

## 2022-02-26 DIAGNOSIS — F419 Anxiety disorder, unspecified: Secondary | ICD-10-CM | POA: Diagnosis not present

## 2022-02-26 DIAGNOSIS — F32A Depression, unspecified: Secondary | ICD-10-CM | POA: Insufficient documentation

## 2022-02-26 DIAGNOSIS — K635 Polyp of colon: Secondary | ICD-10-CM | POA: Diagnosis not present

## 2022-02-26 DIAGNOSIS — Z86010 Personal history of colonic polyps: Secondary | ICD-10-CM | POA: Diagnosis not present

## 2022-02-26 DIAGNOSIS — Z950 Presence of cardiac pacemaker: Secondary | ICD-10-CM | POA: Insufficient documentation

## 2022-02-26 DIAGNOSIS — M1990 Unspecified osteoarthritis, unspecified site: Secondary | ICD-10-CM | POA: Diagnosis not present

## 2022-02-26 DIAGNOSIS — Z800 Family history of malignant neoplasm of digestive organs: Secondary | ICD-10-CM | POA: Insufficient documentation

## 2022-02-26 DIAGNOSIS — Z09 Encounter for follow-up examination after completed treatment for conditions other than malignant neoplasm: Secondary | ICD-10-CM | POA: Diagnosis not present

## 2022-02-26 DIAGNOSIS — Z87891 Personal history of nicotine dependence: Secondary | ICD-10-CM | POA: Diagnosis not present

## 2022-02-26 DIAGNOSIS — D123 Benign neoplasm of transverse colon: Secondary | ICD-10-CM | POA: Insufficient documentation

## 2022-02-26 DIAGNOSIS — Z7901 Long term (current) use of anticoagulants: Secondary | ICD-10-CM | POA: Diagnosis not present

## 2022-02-26 DIAGNOSIS — I4891 Unspecified atrial fibrillation: Secondary | ICD-10-CM | POA: Insufficient documentation

## 2022-02-26 DIAGNOSIS — D125 Benign neoplasm of sigmoid colon: Secondary | ICD-10-CM | POA: Insufficient documentation

## 2022-02-26 DIAGNOSIS — I10 Essential (primary) hypertension: Secondary | ICD-10-CM | POA: Diagnosis not present

## 2022-02-26 HISTORY — PX: POLYPECTOMY: SHX5525

## 2022-02-26 HISTORY — PX: COLONOSCOPY WITH PROPOFOL: SHX5780

## 2022-02-26 LAB — GLUCOSE, CAPILLARY: Glucose-Capillary: 139 mg/dL — ABNORMAL HIGH (ref 70–99)

## 2022-02-26 SURGERY — COLONOSCOPY WITH PROPOFOL
Anesthesia: Monitor Anesthesia Care

## 2022-02-26 MED ORDER — PROPOFOL 10 MG/ML IV BOLUS
INTRAVENOUS | Status: DC | PRN
  Administered 2022-02-26: 40 mg via INTRAVENOUS

## 2022-02-26 MED ORDER — PROPOFOL 500 MG/50ML IV EMUL
INTRAVENOUS | Status: AC
  Filled 2022-02-26: qty 100

## 2022-02-26 MED ORDER — SODIUM CHLORIDE 0.9 % IV SOLN: INTRAVENOUS | Status: DC

## 2022-02-26 MED ORDER — PROPOFOL 500 MG/50ML IV EMUL
INTRAVENOUS | Status: DC | PRN
  Administered 2022-02-26: 100 ug/kg/min via INTRAVENOUS

## 2022-02-26 MED ORDER — AMISULPRIDE (ANTIEMETIC) 5 MG/2ML IV SOLN: 10.0000 mg | Freq: Once | INTRAVENOUS | Status: DC | PRN

## 2022-02-26 MED ORDER — ONDANSETRON HCL 4 MG/2ML IJ SOLN: 4.0000 mg | Freq: Once | INTRAMUSCULAR | Status: DC | PRN

## 2022-02-26 MED ORDER — LACTATED RINGERS IV SOLN: INTRAVENOUS | Status: DC

## 2022-02-26 MED ORDER — PHENYLEPHRINE HCL (PRESSORS) 10 MG/ML IV SOLN
INTRAVENOUS | Status: AC
  Filled 2022-02-26: qty 1

## 2022-02-26 MED ORDER — PROPOFOL 1000 MG/100ML IV EMUL
INTRAVENOUS | Status: AC
  Filled 2022-02-26: qty 100

## 2022-02-26 SURGICAL SUPPLY — 22 items

## 2022-02-26 NOTE — H&P (Signed)
[facility] History and Physical   Primary Care Physician:  Fabio Neighbors, NP   Reason for Procedure:   History of colon polyps, family history of colon cancer  Plan:    colonoscopy     HPI: Lucas Porter is a 66 y.o. male  here for colonoscopy surveillance - last exam October 2018 - 7 polyps removed, one advanced. Strong family history of colon cancer. Significant cardiac history - on coumadin and Plavix - cleared by cardiology to hold both for 5 days. Otherwise feels well without any cardiopulmonary symptoms today. I have discussed risks / benefits of the procedure and anesthesia and he wants to proceed.   Past Medical History:  Diagnosis Date   Anxiety    Atrial fibrillation (H[facility])    paroxysmal   Atrial flutter ([place])    Coagulopathy ([place])    persistent elevation of ACT during 10/17/11 hospitalization   Coronary artery disease    s/p CABG 2010, s/p Successful PCI  10/17/11 of Distal Left Main and the Left Circumflex with a Promus Element DES stent -- 4.0 mm x 16 mm postdilated to 4.12 mm    DDD (degenerative disc disease)    Depression    Dysrhythmia    afib   Facial asymmetry 2010   pt. stated it was very temporary, never seen by doctor for it.    GERD (gastroesophageal reflux disease)    Heart murmur    Hyperlipidemia    Hypertension    Kidney stones    Presence of permanent cardiac pacemaker    Medtronic    S/P coronary artery stent placement to distal Lt. main and Lt. Cx 10/17/11   Tachycardia-bradycardia syndrome ([place]) 2/12   s/p PPM (MDT) by Dr Blanch Media    Past Surgical History:  Procedure Laterality Date   ATRIAL FIBRILLATION ABLATION  04/24/12, 12/02/12   PVI x2 and CTI ablation by Dr Rayann Heman   ATRIAL FIBRILLATION ABLATION N/A 04/24/2012   Procedure: ATRIAL FIBRILLATION ABLATION;  Surgeon: Thompson Grayer, MD;  Location: [facility] CATH LAB;  Service: Cardiovascular;  Laterality: N/A;   ATRIAL FIBRILLATION ABLATION N/A 12/02/2012   Procedure: ATRIAL  FIBRILLATION ABLATION;  Surgeon: Thompson Grayer, MD;  Location: [facility] CATH LAB;  Service: Cardiovascular;  Laterality: N/A;   CORONARY ANGIOPLASTY  10/19/11   CORONARY ARTERY BYPASS GRAFT  2010   CORONARY STENT INTERVENTION N/A 04/13/2019   Procedure: CORONARY STENT INTERVENTION;  Surgeon: Nelva Bush, MD;  Location: [place] CV LAB;  Service: Cardiovascular;  Laterality: N/A;   CORONARY STENT INTERVENTION N/A 07/28/2019   Procedure: CORONARY STENT INTERVENTION;  Surgeon: Jettie Booze, MD;  Location: [place] CV LAB;  Service: Cardiovascular;  Laterality: N/A;   CYSTOSCOPY  2007 ?   treatment for Kidney stones    INGUINAL HERNIA REPAIR Left 01/04/2016   Procedure: LAPAROSCOPIC LEFT INGUINAL HERNIA WITH MESH;  Surgeon: Ralene Ok, MD;  Location: [place];  Service: General;  Laterality: Left;   INSERT / REPLACE / REMOVE PACEMAKER  2/12   Medtronic   INSERTION OF MESH Left 01/04/2016   Procedure: INSERTION OF MESH;  Surgeon: Ralene Ok, MD;  Location: [place];  Service: General;  Laterality: Left;   LAPAROSCOPIC LYSIS OF ADHESIONS Left 01/04/2016   Procedure: LAPAROSCOPIC LYSIS OF ADHESIONS;  Surgeon: Ralene Ok, MD;  Location: [place];  Service: General;  Laterality: Left;   LEFT HEART CATH AND CORS/GRAFTS ANGIOGRAPHY N/A 02/20/2019   Procedure: LEFT HEART CATH AND CORS/GRAFTS ANGIOGRAPHY;  Surgeon: Daneen Schick  W, MD;  Location: [place] CV LAB;  Service: Cardiovascular;  Laterality: N/A;   LEFT HEART CATH AND CORS/GRAFTS ANGIOGRAPHY N/A 04/13/2019   Procedure: LEFT HEART CATH AND CORS/GRAFTS ANGIOGRAPHY;  Surgeon: Nelva Bush, MD;  Location: [place] CV LAB;  Service: Cardiovascular;  Laterality: N/A;   LEFT HEART CATH AND CORS/GRAFTS ANGIOGRAPHY N/A 07/28/2019   Procedure: LEFT HEART CATH AND CORS/GRAFTS ANGIOGRAPHY;  Surgeon: Jettie Booze, MD;  Location: [place] CV LAB;  Service: Cardiovascular;  Laterality: N/A;   LEFT HEART CATH AND CORS/GRAFTS  ANGIOGRAPHY N/A 02/09/2020   Procedure: LEFT HEART CATH AND CORS/GRAFTS ANGIOGRAPHY;  Surgeon: Sherren Mocha, MD;  Location: [place] CV LAB;  Service: Cardiovascular;  Laterality: N/A;   LEFT HEART CATHETERIZATION WITH CORONARY/GRAFT ANGIOGRAM N/A 10/17/2011   Procedure: LEFT HEART CATHETERIZATION WITH Beatrix Fetters;  Surgeon: [name], MD;  Location: [place] CATH LAB;  Service: Cardiovascular;  Laterality: N/A;   LEFT HEART CATHETERIZATION WITH CORONARY/GRAFT ANGIOGRAM N/A 02/08/2012   Procedure: LEFT HEART CATHETERIZATION WITH Beatrix Fetters;  Surgeon: [name], MD;  Location: [place] CATH LAB;  Service: Cardiovascular;  Laterality: N/A;   NM MYOCAR PERF WALL MOTION  03/23/2011   Low risk   PERCUTANEOUS CORONARY STENT INTERVENTION (PCI-S)  10/17/2011   Procedure: PERCUTANEOUS CORONARY STENT INTERVENTION (PCI-S);  Surgeon: [name], MD;  Location: [facility] CATH LAB;  Service: Cardiovascular;;   PPM GENERATOR CHANGEOUT N/A 02/20/2019   Procedure: PPM GENERATOR CHANGEOUT;  Surgeon: [name], MD;  Location: [place] CV LAB;  Service: Cardiovascular;  Laterality: N/A;   TEE WITHOUT CARDIOVERSION  04/23/2012   Procedure: TRANSESOPHAGEAL ECHOCARDIOGRAM (TEE);  Surgeon: Josue Hector, MD;  Location: [facility] ENDOSCOPY;  Service: Cardiovascular;  Laterality: N/A;  spoke to pt about time change from 11a to 12p/DL   TEE WITHOUT CARDIOVERSION N/A 12/01/2012   Procedure: TRANSESOPHAGEAL ECHOCARDIOGRAM (TEE);  Surgeon: [name], MD;  Location: [facility] ENDOSCOPY;  Service: Cardiovascular;  Laterality: N/A;    Prior to Admission medications   Medication Sig Start Date End Date Taking? Authorizing Provider  acetaminophen (TYLENOL) 500 MG tablet Take 1,000 mg by mouth every 6 (six) hours as needed for moderate pain.   Yes [provider]  clopidogrel (PLAVIX) 75 MG tablet Take 1 tablet (75 mg total) by mouth daily. 11/30/21  Yes Almyra Deforest, PA  dextromethorphan-guaiFENesin  [facility] DM) 30-600 MG 12hr tablet Take 1 tablet by mouth 2 (two) times daily as needed (congestion).   Yes [provider]  diltiazem (DILT-XR) 180 MG 24 hr capsule Take 1 capsule (180 mg total) by mouth daily. 11/30/21  Yes Meng, Isaac Laud, PA  fish oil-omega-3 fatty acids 1000 MG capsule Take 2 g by mouth daily.    Yes [provider]  isosorbide mononitrate (IMDUR) 60 MG 24 hr tablet TAKE 1 TABLET BY MOUTH  TWICE DAILY 12/27/21  Yes Croitoru, Mihai, MD  lisinopril (ZESTRIL) 20 MG tablet Take 1 tablet (20 mg total) by mouth daily. 03/17/21  Yes Croitoru, Mihai, MD  MELATONIN PO Take 1 tablet by mouth at bedtime as needed (sleep).   Yes [provider]  metFORMIN (GLUCOPHAGE) 500 MG tablet Take 1 tablet (500 mg total) by mouth 2 (two) times daily with a meal. Patient taking differently: Take 1,000 mg by mouth 2 (two) times daily with a meal. 06/18/18  Yes Stallings, Zoe A, MD  metoprolol tartrate (LOPRESSOR) 100 MG tablet Take 100 mg by mouth 2 (two) times daily.   Yes [provider]  ondansetron (ZOFRAN) 8 MG tablet Take 8 mg by mouth every 8 (eight) hours as needed for nausea/vomiting. 02/08/22  Yes [provider]  pantoprazole (PROTONIX) 40 MG tablet TAKE 1 TABLET BY MOUTH DAILY 01/15/22  Yes Croitoru, Mihai, MD  potassium chloride (KLOR-CON) 10 MEQ tablet TAKE 1 TABLET BY MOUTH  DAILY 01/15/22  Yes Croitoru, Mihai, MD  ranolazine (RANEXA) 500 MG 12 hr tablet Take 1 tablet (500 mg total) by mouth 2 (two) times daily. 05/04/21  Yes Croitoru, Mihai, MD  rosuvastatin (CRESTOR) 40 MG tablet TAKE 1 TABLET BY MOUTH ONCE DAILY 11/01/17  Yes Jeffery, [name], PA  venlafaxine XR (EFFEXOR-XR) 150 MG 24 hr capsule TAKE ONE CAPSULE BY MOUTH ONCE DAILY WITH  THE  75  MG  TO  TOTAL  225  MG  DAILY Patient taking differently: Take 150 mg by mouth daily. 07/12/17  Yes Jeffery, [name], PA  venlafaxine XR (EFFEXOR-XR) 75 MG 24 hr capsule TAKE ONE CAPSULE BY MOUTH ONCE DAILY TAKE  WITH   '150MG'$   TO  EQUAL  A  TOTAL  OF  '225MG'$   DAILY Patient taking differently: Take 75 mg by mouth at bedtime. 07/12/17  Yes Jeffery, [name], PA  clonazePAM (KLONOPIN) 0.5 MG tablet Take 1 tablet (0.5 mg total) by mouth 2 (two) times daily as needed for anxiety. Patient not taking: Reported on 02/22/2022 12/27/20   Croitoru, Dani Gobble, MD  nitroGLYCERIN (NITROSTAT) 0.4 MG SL tablet Take one tablet as needed for chest pain, can take up to three times 5 min apart after 3 tablets and no relief call 911. 11/30/21   Almyra Deforest, PA  warfarin (COUMADIN) 5 MG tablet TAKE 1/2 TO 1 TABLET BY  MOUTH DAILY AS DIRECTED BY  COUMADIN CLINIC Patient taking differently: Take 2.5-5 mg by mouth See admin instructions. Take 2.5 mg daily except on Wednesday take 5 mg 10/09/21   Croitoru, Mihai, MD    No current facility-administered medications for this encounter.    Allergies as of 02/20/2022   (No Known Allergies)    Family History  Problem Relation Age of Onset   Colon cancer Mother    Alzheimer's disease Father    Heart disease Father    Hypertension Sister    Stroke Brother    Hypertension Brother    Heart attack Brother    Heart disease Brother    Hypertension Brother    Diabetes Brother    Coronary artery disease Other    Stomach cancer Neg Hx    Pancreatic cancer Neg Hx     Social History   Socioeconomic History   Marital status: Married    Spouse name: Morey Hummingbird   Number of children: 2   Years of education: 10   Highest education level: 10th grade  Occupational History   Occupation: disability-heart disease    Comment: Investment banker, corporate  Tobacco Use   Smoking status: Never   Smokeless tobacco: Former    Types: Chew    Quit date: 10/03/2012   Tobacco comments:    quit smoking in 1970's  Vaping Use   Vaping Use: Never used  Substance and Sexual Activity   Alcohol use: Not Currently    Comment: rare   Drug use: Yes    Types: Marijuana    Comment: daily   Sexual activity: Yes  Other Topics  Concern   Not on file  Social History Narrative   Pt lives in [place] with spouse.     2 grown daughters.  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   Caffeine intake daily    Social Determinants of Health   Financial Resource Strain: Not on file  Food Insecurity: Not on file  Transportation Needs: Not on file  Physical Activity: Not on file  Stress: Not on file  Social Connections: Not on file  Intimate Partner Violence: Not on file    Review of Systems: All other review of systems negative except as mentioned in the HPI.  Physical Exam: Vital signs BP (!) 150/78   Pulse 81   Temp (!) 97.3 F (36.3 C) (Temporal)   Resp 15   Ht '5\' 10"'$  (1.778 m)   Wt 90.7 kg   SpO2 97%   BMI 28.70 kg/m   General:   Alert,  Well-developed, pleasant and cooperative in NAD Lungs:  Clear throughout to auscultation.   Heart:  Regular rate and rhythm Abdomen:  Soft, nontender and nondistended.   Neuro/Psych:  Alert and cooperative. Normal mood and affect. A and O x 3  Jolly Mango, MD [facility]

## 2022-02-26 NOTE — Op Note (Signed)
[facility] Patient Name: Lucas Porter Procedure Date: 02/26/2022 MRN: [id number] Attending MD: Carlota Raspberry. Havery Moros , MD Date of Birth: April 21, 1956 CSN: [id number] Age: 66 Admit Type: Outpatient Procedure:                Colonoscopy Indications:              High risk colon cancer surveillance: Personal                            history of colonic polyps - last exam 2018 - 7                            polyps removed, one advanced. Strong first degree                            family history of colon cancer diagnosed at age <                            26. On Plavix and coumadin, both have been held for                            5 days Providers:                Remo Lipps P. Havery Moros, MD, Carlyn Reichert, RN,                            William Dalton, Technician, Darliss Cheney, Technician Referring MD:              Medicines:                Monitored Anesthesia Care Complications:            No immediate complications. Estimated blood loss:                            Minimal. Estimated Blood Loss:     Estimated blood loss was minimal. Procedure:                Pre-Anesthesia Assessment:                           - Prior to the procedure, a History and Physical                            was performed, and patient medications and                            allergies were reviewed. The patient's tolerance of                            previous anesthesia was also reviewed. The risks                            and benefits of the procedure and the sedation  options and risks were discussed with the patient.                            All questions were answered, and informed consent                            was obtained. Prior Anticoagulants: The patient has                            taken Plavix (clopidogrel), last dose was 5 days                            prior to procedure, last dose of Coumadin 5 days                            prior to the  procedure. ASA Grade Assessment: III -                            A patient with severe systemic disease. After                            reviewing the risks and benefits, the patient was                            deemed in satisfactory condition to undergo the                            procedure.                           After obtaining informed consent, the colonoscope                            was passed under direct vision. Throughout the                            procedure, the patient's blood pressure, pulse, and                            oxygen saturations were monitored continuously. The                            CF-HQ190L ([id number]) Olympus colonoscope was                            introduced through the anus and advanced to the the                            cecum, identified by appendiceal orifice and                            ileocecal valve. The colonoscopy was performed  without difficulty. The patient tolerated the                            procedure well. The quality of the bowel                            preparation was good. The ileocecal valve,                            appendiceal orifice, and rectum were photographed. Scope In: 9:14:43 AM Scope Out: 9:30:33 AM Scope Withdrawal Time: 0 hours 13 minutes 15 seconds  Total Procedure Duration: 0 hours 15 minutes 50 seconds  Findings:      The perianal and digital rectal examinations were normal.      A 3 mm polyp was found in the transverse colon. The polyp was sessile.       The polyp was removed with a cold snare. Resection and retrieval were       complete.      A 3 mm polyp was found in the sigmoid colon. The polyp was sessile. The       polyp was removed with a cold snare. Resection and retrieval were       complete.      A few small-mouthed diverticula were found in the sigmoid colon.      Internal hemorrhoids were found during retroflexion.      The exam was otherwise without  abnormality. Impression:               - One 3 mm polyp in the transverse colon, removed                            with a cold snare. Resected and retrieved.                           - One 3 mm polyp in the sigmoid colon, removed with                            a cold snare. Resected and retrieved.                           - Diverticulosis in the sigmoid colon.                           - Internal hemorrhoids.                           - The examination was otherwise normal. Moderate Sedation:      No moderate sedation, case performed with MAC Recommendation:           - Patient has a contact number available for                            emergencies. The signs and symptoms of potential                            delayed complications were discussed with the  patient. Return to normal activities tomorrow.                            Written discharge instructions were provided to the                            patient.                           - Resume previous diet.                           - Continue present medications.                           - Resume Coumadin tonight                           - Resume Plavix tomorrow morning                           - Await pathology results. Anticipate repeat                            colonoscopy in 5 years Procedure Code(s):        --- Professional ---                           [phone], Colonoscopy, flexible; with removal of                            tumor(s), polyp(s), or other lesion(s) by snare                            technique Diagnosis Code(s):        --- Professional ---                           Z86.010, Personal history of colonic polyps                           K63.5, Polyp of colon                           K64.8, Other hemorrhoids                           K57.30, Diverticulosis of large intestine without                            perforation or abscess without bleeding CPT copyright 2019  American Medical Association. All rights reserved. The codes documented in this report are preliminary and upon coder review may  be revised to meet current compliance requirements. Remo Lipps P. Lucas Beavers, MD 02/26/2022 9:37:12 AM This report has been signed electronically. Number of Addenda: 0

## 2022-02-26 NOTE — Anesthesia Postprocedure Evaluation (Signed)
Anesthesia Post Note  Patient: Lucas Porter  Procedure(s) Performed: COLONOSCOPY WITH PROPOFOL POLYPECTOMY     Patient location during evaluation: PACU Anesthesia Type: MAC Level of consciousness: awake and alert Pain management: pain level controlled Vital Signs Assessment: post-procedure vital signs reviewed and stable Respiratory status: spontaneous breathing, nonlabored ventilation, respiratory function stable and patient connected to nasal cannula oxygen Cardiovascular status: stable and blood pressure returned to baseline Postop Assessment: no apparent nausea or vomiting Anesthetic complications: no   No notable events documented.  Last Vitals:  Vitals:   02/26/22 0954 02/26/22 0956  BP: (!) 90/51 103/68  Pulse: 73 68  Resp: 11 13  Temp:    SpO2: 97% 94%    Last Pain:  Vitals:   02/26/22 0936  TempSrc: Temporal  PainSc:                  [name]

## 2022-02-26 NOTE — Discharge Instructions (Signed)
YOU HAD AN ENDOSCOPIC PROCEDURE TODAY: Refer to the procedure report and other information in the discharge instructions given to you for any specific questions about what was found during the examination. If this information does not answer your questions, please call [facility] office at [phone] to clarify.  ° °YOU SHOULD EXPECT: Some feelings of bloating in the abdomen. Passage of more gas than usual. Walking can help get rid of the air that was put into your GI tract during the procedure and reduce the bloating. If you had a lower endoscopy (such as a colonoscopy or flexible sigmoidoscopy) you may notice spotting of blood in your stool or on the toilet paper. Some abdominal soreness may be present for a day or two, also. ° °DIET: Your first meal following the procedure should be a light meal and then it is ok to progress to your normal diet. A half-sandwich or bowl of soup is an example of a good first meal. Heavy or fried foods are harder to digest and may make you feel nauseous or bloated. Drink plenty of fluids but you should avoid alcoholic beverages for 24 hours. If you had a esophageal dilation, please see attached instructions for diet.   ° °ACTIVITY: Your care partner should take you home directly after the procedure. You should plan to take it easy, moving slowly for the rest of the day. You can resume normal activity the day after the procedure however YOU SHOULD NOT DRIVE, use power tools, machinery or perform tasks that involve climbing or major physical exertion for 24 hours (because of the sedation medicines used during the test).  ° °SYMPTOMS TO REPORT IMMEDIATELY: °A gastroenterologist can be reached at any hour. Please call [phone]  for any of the following symptoms:  °Following lower endoscopy (colonoscopy, flexible sigmoidoscopy) °Excessive amounts of blood in the stool  °Significant tenderness, worsening of abdominal pains  °Swelling of the abdomen that is new, acute  °Fever of 100° or  higher  °Following upper endoscopy (EGD, EUS, ERCP, esophageal dilation) °Vomiting of blood or coffee ground material  °New, significant abdominal pain  °New, significant chest pain or pain under the shoulder blades  °Painful or persistently difficult swallowing  °New shortness of breath  °Black, tarry-looking or red, bloody stools ° °FOLLOW UP:  °If any biopsies were taken you will be contacted by phone or by letter within the next 1-3 weeks. Call [phone]  if you have not heard about the biopsies in 3 weeks.  °Please also call with any specific questions about appointments or follow up tests. ° °

## 2022-02-26 NOTE — Anesthesia Preprocedure Evaluation (Addendum)
Anesthesia Evaluation  Patient identified by MRN, date of birth, ID band Patient awake    Reviewed: Allergy & Precautions, NPO status , Patient's Chart, lab work & pertinent test results  Airway Mallampati: II  TM Distance: >3 FB Neck ROM: Full    Dental  (+) Teeth Intact, Dental Advisory Given   Pulmonary    breath sounds clear to auscultation       Cardiovascular hypertension, Pt. on medications and Pt. on home beta blockers + CAD, + Cardiac Stents and + CABG  + dysrhythmias Atrial Fibrillation + pacemaker + Valvular Problems/Murmurs  Rhythm:Regular Rate:Normal     Neuro/Psych PSYCHIATRIC DISORDERS Anxiety Depression    GI/Hepatic Neg liver ROS, GERD  Medicated,  Endo/Other  diabetes  Renal/GU Renal disease     Musculoskeletal  (+) Arthritis ,   Abdominal Normal abdominal exam  (+)   Peds  Hematology negative hematology ROS (+)   Anesthesia Other Findings   Reproductive/Obstetrics                            Anesthesia Physical Anesthesia Plan  ASA: 3  Anesthesia Plan: MAC   Post-op Pain Management:    Induction: Intravenous  PONV Risk Score and Plan: 0 and Propofol infusion  Airway Management Planned: Natural Airway and Simple Face Mask  Additional Equipment: None  Intra-op Plan:   Post-operative Plan:   Informed Consent: I have reviewed the patients History and Physical, chart, labs and discussed the procedure including the risks, benefits and alternatives for the proposed anesthesia with the patient or authorized representative who has indicated his/her understanding and acceptance.       Plan Discussed with: CRNA  Anesthesia Plan Comments:        Anesthesia Quick Evaluation

## 2022-02-26 NOTE — Transfer of Care (Signed)
Immediate Anesthesia Transfer of Care Note  Patient: Lucas Porter  Procedure(s) Performed: COLONOSCOPY WITH PROPOFOL POLYPECTOMY  Patient Location: PACU  Anesthesia Type:MAC  Level of Consciousness: awake and drowsy  Airway & Oxygen Therapy: Patient Spontanous Breathing and Patient connected to face mask oxygen  Post-op Assessment: Report given to RN and Post -op Vital signs reviewed and stable  Post vital signs: Reviewed and stable  Last Vitals:  Vitals Value Taken Time  BP 112/60 02/26/22 0936  Temp 36.4 C 02/26/22 0936  Pulse 68 02/26/22 0937  Resp 21 02/26/22 0937  SpO2 100 % 02/26/22 0937  Vitals shown include unvalidated device data.  Last Pain:  Vitals:   02/26/22 0936  TempSrc: Temporal  PainSc:          Complications: No notable events documented.

## 2022-02-27 LAB — SURGICAL PATHOLOGY

## 2022-03-06 NOTE — Progress Notes (Signed)
Remote pacemaker transmission.   

## 2022-03-12 ENCOUNTER — Ambulatory Visit: Payer: Medicare Other | Admitting: Cardiovascular Disease

## 2022-03-12 ENCOUNTER — Encounter: Payer: Self-pay | Admitting: Cardiovascular Disease

## 2022-03-12 ENCOUNTER — Ambulatory Visit (INDEPENDENT_AMBULATORY_CARE_PROVIDER_SITE_OTHER): Payer: Medicare Other

## 2022-03-12 VITALS — BP 126/80 | HR 62 | Ht 70.0 in | Wt 204.6 lb

## 2022-03-12 DIAGNOSIS — Z950 Presence of cardiac pacemaker: Secondary | ICD-10-CM | POA: Diagnosis not present

## 2022-03-12 DIAGNOSIS — D6869 Other thrombophilia: Secondary | ICD-10-CM

## 2022-03-12 DIAGNOSIS — I4821 Permanent atrial fibrillation: Secondary | ICD-10-CM

## 2022-03-12 DIAGNOSIS — Z7901 Long term (current) use of anticoagulants: Secondary | ICD-10-CM

## 2022-03-12 DIAGNOSIS — N200 Calculus of kidney: Secondary | ICD-10-CM

## 2022-03-12 DIAGNOSIS — I480 Paroxysmal atrial fibrillation: Secondary | ICD-10-CM

## 2022-03-12 DIAGNOSIS — I25708 Atherosclerosis of coronary artery bypass graft(s), unspecified, with other forms of angina pectoris: Secondary | ICD-10-CM | POA: Diagnosis not present

## 2022-03-12 DIAGNOSIS — E119 Type 2 diabetes mellitus without complications: Secondary | ICD-10-CM

## 2022-03-12 DIAGNOSIS — R55 Syncope and collapse: Secondary | ICD-10-CM

## 2022-03-12 DIAGNOSIS — E785 Hyperlipidemia, unspecified: Secondary | ICD-10-CM

## 2022-03-12 LAB — POCT INR: INR: 3.4 — AB (ref 2.0–3.0)

## 2022-03-12 NOTE — Progress Notes (Signed)
Cardiology Office Note   Date:  03/17/2022   ID:  Lucas Porter, DOB [birth year], MRN [id number]  PCP:  Fabio Neighbors, NP  Cardiologist:  [name], MD  Electrophysiologist:  None   Evaluation Performed:  Follow-Up Visit  Chief Complaint:  Chest pain  History of Present Illness:    Lucas Porter is a 66 y.o. 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  2 years ago in May 2021 (6 months after the last PCI) he had a cardiac catheterization that showed no targets for revascularization (patent, mild in-stent restenosis in the left main-left circumflex stent, patent stent in the OM branch of the circumflex, patent stent in the SVG to diagonal, patent SVG to ramus intermedius and LIMA to LAD, known chronic total occlusion of both the native right coronary artery and the SVG to RCA).  Medical therapy including long-term treatment with clopidogrel was recommended.  Diagnostic Dominance: Co-dominant    Angina pectoris is generally well controlled on a combination of diltiazem, isosorbide mononitrate and metoprolol and Ranexa.  He is compliant with statin.  He takes both warfarin and clopidogrel and has not had any bleeding problems.  He has not taken nitroglycerin in months.  He would like to stop the Ranexa.  His nuclear stress test on 01/10/2021 did not show any major reversible abnormalities, although a small fixed apical infarction was seen.  He denies shortness of breath at rest with activity, dizziness, syncope, palpitations,  focal neurological complaints, falls or active bleeding.  He does not have orthopnea, PND well-developed.  Recently he has had issues with nausea, vomiting and backache and was found to have nephrolithiasis.  He was seen by urologist in [place], Dr. Domingo Cocking, but he talks about moving his urology care to [facility].  He needs a lithotripsy.  He has chronic problems with anxiety and did poorly when he ran out of clonazepam in the past.  Pacemaker interrogation shows normal device function (Medtronic Azure DR).  He had a generator change in 2020 and has roughly 10.6 years of remaining generator longevity.  Lead parameters are all normal and stable.  He only has 43 % ventricular pacing (he is not device dependent).  Overall, rate control is good.  He has not had any recent episodes of-rates.  Labs performed in mid May show hemoglobin A1c of 6.4%, total hemoglobin 12.9, creatinine 1.47, potassium 4.0.  His most recent lipid profile showed an LDL cholesterol of 50 and HDL of 37.  His BMI is just under 30.   Past Medical History:  Diagnosis Date   Anxiety    Atrial fibrillation (HCC)    paroxysmal   Atrial flutter ([place])    Coagulopathy ([place])    persistent elevation of ACT during 10/17/11 hospitalization   Coronary artery disease    s/p CABG 2010, s/p Successful PCI  10/17/11 of Distal Left Main and the Left Circumflex with a Promus Element DES stent -- 4.0 mm x 16 mm postdilated to 4.12 mm    DDD (degenerative disc disease)    Depression    Dysrhythmia    afib   Facial asymmetry 2010   pt. stated it was very temporary,  never seen by doctor for it.    GERD (gastroesophageal reflux disease)    Heart murmur    Hyperlipidemia    Hypertension    Kidney stones    Presence of permanent cardiac pacemaker    Medtronic    S/P coronary artery stent placement to distal Lt. main and Lt. Cx 10/17/11   Tachycardia-bradycardia syndrome ([place]) 2/12   s/p PPM (MDT) by Dr Blanch Media   Past Surgical  History:  Procedure Laterality Date   ATRIAL FIBRILLATION ABLATION  04/24/12, 12/02/12   PVI x2 and CTI ablation by Dr Rayann Heman   ATRIAL FIBRILLATION ABLATION N/A 04/24/2012   Procedure: ATRIAL FIBRILLATION ABLATION;  Surgeon: Thompson Grayer, MD;  Location: [facility] CATH LAB;  Service: Cardiovascular;  Laterality: N/A;   ATRIAL FIBRILLATION ABLATION N/A 12/02/2012   Procedure: ATRIAL FIBRILLATION ABLATION;  Surgeon: Thompson Grayer, MD;  Location[facility] CATH LAB;  Service: Cardiovascular;  Laterality: N/A;   COLONOSCOPY WITH PROPOFOL N/A 02/26/2022   Procedure: COLONOSCOPY WITH PROPOFOL;  Surgeon: Yetta Flock, MD;  Location: [facility] ENDOSCOPY;  Service: Gastroenterology;  Laterality: N/A;   CORONARY ANGIOPLASTY  10/19/11   CORONARY ARTERY BYPASS GRAFT  2010   CORONARY STENT INTERVENTION N/A 04/13/2019   Procedure: CORONARY STENT INTERVENTION;  Surgeon: Nelva Bush, MD;  Location: [place] CV LAB;  Service: Cardiovascular;  Laterality: N/A;   CORONARY STENT INTERVENTION N/A 07/28/2019   Procedure: CORONARY STENT INTERVENTION;  Surgeon: Jettie Booze, MD;  Location: [place] CV LAB;  Service: Cardiovascular;  Laterality: N/A;   CYSTOSCOPY  2007 ?   treatment for Kidney stones    INGUINAL HERNIA REPAIR Left 01/04/2016   Procedure: LAPAROSCOPIC LEFT INGUINAL HERNIA WITH MESH;  Surgeon: Ralene Ok, MD;  Location: [place];  Service: General;  Laterality: Left;   INSERT / REPLACE / REMOVE PACEMAKER  2/12   Medtronic   INSERTION OF MESH Left 01/04/2016   Procedure: INSERTION OF MESH;  Surgeon: Ralene Ok, MD;  Location: [place];  Service: General;  Laterality: Left;   LAPAROSCOPIC LYSIS OF ADHESIONS Left 01/04/2016   Procedure: LAPAROSCOPIC LYSIS OF ADHESIONS;  Surgeon: Ralene Ok, MD;  Location: [place];  Service: General;  Laterality: Left;   LEFT HEART CATH AND CORS/GRAFTS ANGIOGRAPHY N/A 02/20/2019   Procedure: LEFT HEART CATH AND CORS/GRAFTS ANGIOGRAPHY;  Surgeon: Belva Crome, MD;   Location: [place] CV LAB;  Service: Cardiovascular;  Laterality: N/A;   LEFT HEART CATH AND CORS/GRAFTS ANGIOGRAPHY N/A 04/13/2019   Procedure: LEFT HEART CATH AND CORS/GRAFTS ANGIOGRAPHY;  Surgeon: Nelva Bush, MD;  Location: [place] CV LAB;  Service: Cardiovascular;  Laterality: N/A;   LEFT HEART CATH AND CORS/GRAFTS ANGIOGRAPHY N/A 07/28/2019   Procedure: LEFT HEART CATH AND CORS/GRAFTS ANGIOGRAPHY;  Surgeon: Jettie Booze, MD;  Location: [place] CV LAB;  Service: Cardiovascular;  Laterality: N/A;   LEFT HEART CATH AND CORS/GRAFTS ANGIOGRAPHY N/A 02/09/2020   Procedure: LEFT HEART CATH AND CORS/GRAFTS ANGIOGRAPHY;  Surgeon: Sherren Mocha, MD;  Location: [place] CV LAB;  Service: Cardiovascular;  Laterality: N/A;   LEFT HEART CATHETERIZATION WITH CORONARY/GRAFT ANGIOGRAM N/A 10/17/2011   Procedure: LEFT HEART CATHETERIZATION WITH Beatrix Fetters;  Surgeon: [name], MD;  Location: [place] CATH LAB;  Service: Cardiovascular;  Laterality: N/A;   LEFT HEART CATHETERIZATION WITH CORONARY/GRAFT ANGIOGRAM N/A 02/08/2012   Procedure: LEFT HEART CATHETERIZATION WITH Beatrix Fetters;  Surgeon: [name], MD;  Location: [place] CATH LAB;  Service: Cardiovascular;  Laterality: N/A;   NM MYOCAR PERF  WALL MOTION  03/23/2011   Low risk   PERCUTANEOUS CORONARY STENT INTERVENTION (PCI-S)  10/17/2011   Procedure: PERCUTANEOUS CORONARY STENT INTERVENTION (PCI-S);  Surgeon: [name], MD;  Location: [facility] CATH LAB;  Service: Cardiovascular;;   POLYPECTOMY  02/26/2022   Procedure: POLYPECTOMY;  Surgeon: Yetta Flock, MD;  Location: [facility];  Service: Gastroenterology;;   Betsy Pries GENERATOR CHANGEOUT N/A 02/20/2019   Procedure: PPM GENERATOR CHANGEOUT;  Surgeon: [name], MD;  Location: [place] CV LAB;  Service: Cardiovascular;  Laterality: N/A;   TEE WITHOUT CARDIOVERSION  04/23/2012   Procedure: TRANSESOPHAGEAL ECHOCARDIOGRAM (TEE);  Surgeon: Josue Hector, MD;  Location: [facility] ENDOSCOPY;  Service: Cardiovascular;  Laterality: N/A;  spoke to pt about time change from 11a to 12p/DL   TEE WITHOUT CARDIOVERSION N/A 12/01/2012   Procedure: TRANSESOPHAGEAL ECHOCARDIOGRAM (TEE);  Surgeon: [name], MD;  Location: [facility] ENDOSCOPY;  Service: Cardiovascular;  Laterality: N/A;     Current Meds  Medication Sig   acetaminophen (TYLENOL) 500 MG tablet Take 1,000 mg by mouth every 6 (six) hours as needed for moderate pain.   clonazePAM (KLONOPIN) 0.5 MG tablet Take 1 tablet (0.5 mg total) by mouth 2 (two) times daily as needed for anxiety.   clopidogrel (PLAVIX) 75 MG tablet Take 1 tablet (75 mg total) by mouth daily.   dextromethorphan-guaiFENesin (MUCINEX DM) 30-600 MG 12hr tablet Take 1 tablet by mouth 2 (two) times daily as needed (congestion).   diltiazem (DILT-XR) 180 MG 24 hr capsule Take 1 capsule (180 mg total) by mouth daily.   fish oil-omega-3 fatty acids 1000 MG capsule Take 2 g by mouth daily.    isosorbide mononitrate (IMDUR) 60 MG 24 hr tablet TAKE 1 TABLET BY MOUTH  TWICE DAILY   lisinopril (ZESTRIL) 20 MG tablet Take 1 tablet (20 mg total) by mouth daily.   MELATONIN PO Take 1 tablet by mouth at bedtime as needed (sleep).   metFORMIN (GLUCOPHAGE) 500 MG tablet Take 2 tablets by mouth 2 (two) times daily with a meal.   metoprolol tartrate (LOPRESSOR) 100 MG tablet Take 100 mg by mouth 2 (two) times daily.   ondansetron (ZOFRAN) 8 MG tablet Take 8 mg by mouth every 8 (eight) hours as needed for nausea/vomiting.   pantoprazole (PROTONIX) 40 MG tablet TAKE 1 TABLET BY MOUTH DAILY   potassium chloride (KLOR-CON) 10 MEQ tablet TAKE 1 TABLET BY MOUTH  DAILY   rosuvastatin (CRESTOR) 40 MG tablet TAKE 1 TABLET BY MOUTH ONCE DAILY   venlafaxine XR (EFFEXOR-XR) 150 MG 24 hr capsule TAKE ONE CAPSULE BY MOUTH ONCE DAILY WITH  THE  75  MG  TO  TOTAL  225  MG  DAILY (Patient taking differently: Take 150 mg by mouth daily.)   venlafaxine XR  (EFFEXOR-XR) 75 MG 24 hr capsule TAKE ONE CAPSULE BY MOUTH ONCE DAILY TAKE  WITH  '150MG'$   TO  EQUAL  A  TOTAL  OF  '225MG'$   DAILY (Patient taking differently: Take 75 mg by mouth at bedtime.)   warfarin (COUMADIN) 5 MG tablet TAKE 1/2 TO 1 TABLET BY  MOUTH DAILY AS DIRECTED BY  COUMADIN CLINIC (Patient taking differently: Take 2.5-5 mg by mouth See admin instructions. Take 2.5 mg daily except on Wednesday take 5 mg)   [DISCONTINUED] ranolazine (RANEXA) 500 MG 12 hr tablet Take 1 tablet (500 mg total) by mouth 2 (two) times daily.     Allergies:   Patient has no known allergies.   Social History  Tobacco Use   Smoking status: Never   Smokeless tobacco: Former    Types: Chew    Quit date: 10/03/2012   Tobacco comments:    quit smoking in 1970's  Vaping Use   Vaping Use: Never used  Substance Use Topics   Alcohol use: Not Currently    Comment: rare   Drug use: Yes    Types: Marijuana    Comment: daily     Family Hx: The patient's family history includes Alzheimer's disease in his father; Colon cancer in his mother; Coronary artery disease in an other family member; Diabetes in his brother; Heart attack in his brother; Heart disease in his brother and father; Hypertension in his brother, brother, and sister; Stroke in his brother. There is no history of Stomach cancer or Pancreatic cancer.  ROS:   Please see the history of present illness.    All other systems reviewed and are negative.   Prior CV studies:   The following studies were reviewed today: Nuclear stress test 01/10/2021  EKG not interpretable due to ventricular paced rhythm The left ventricular ejection fraction is mildly decreased (45-54%). Nuclear stress EF: 53%. Defect 1: There is a medium defect of mild severity present in the basal inferior, mid inferior and apical inferior location. Defect 2: There is a small defect of mild severity present in the apex location. Findings consistent with prior myocardial  infarction. This is a low risk study.   1. Fixed perfusion defect at apex with hypokinesis consistent with infarct 2. Fixed inferior perfusion defect with normal wall motion suggests artifact 3. Low risk study.  No ischemia.   Coronary angiography July 28, 2019  Dist LM to [facility] LAD lesion is 100% stenosed. Ost LM to Dist LM lesion is 10% stenosed. Ost 1st Diag lesion is 100% stenosed. Ost 2nd Mrg lesion is 90% stenosed. Non-stenotic Dist Cx lesion was previously treated. Previously placed 3rd Mrg drug eluting stent is widely patent. Previously placed Ost Cx to Prox Cx drug eluting stent is widely patent. Balloon angioplasty was performed. Ost Ramus lesion is 100% stenosed. LIMA and is large. SVG. Prox Graft lesion is 40% stenosed. SVG and is normal in caliber. Non-stenotic Prox Graft lesion was previously treated. Ost RCA to Prox RCA lesion is 100% stenosed. Origin lesion is 100% stenosed.   1. Severe native vessel CAD with mild in-stent restenosis of the left main/left circumflex stent, continued patency of the stented segment in the OM branch of the circumflex 2. Chronic RCA occlusion with chronic occlusion of the SVG-RCA 3. Continued patency of the SVG-ramus intermedius, SVG-diagonal, and LIMA-LAD 4. Normal LVEDP   Recommend: continued medical therapy. Ok to resume warfarin tonight. Continue clopidogrel long term   Labs/Other Tests and Data Reviewed:    EKG: ECG is not performed today.  The tracing from 11/30/2021 shows atrial fibrillation with controlled ventricular sponsor nonspecific ST segment changes.  Today's intracardiac electrogram shows mostly ventricular sensed rhythm, occasional ventricular paced beats.  Recent Labs: No results found for requested labs within last 365 days.   Recent Lipid Panel Lab Results  Component Value Date/Time   CHOL 132 01/10/2021 11:18 AM   TRIG 290 (H) 01/10/2021 11:18 AM   HDL 37 (L) 01/10/2021 11:18 AM   CHOLHDL 3.6  01/10/2021 11:18 AM   CHOLHDL 6.6 02/10/2020 06:21 AM   LDLCALC 50 01/10/2021 11:18 AM    Wt Readings from Last 3 Encounters:  03/12/22 204 lb 9.6 oz (92.8 kg)  02/26/22 200 lb (  90.7 kg)  11/30/21 219 lb (99.3 kg)     Objective:    Vital Signs:  BP 126/80 (BP Location: Right Arm, Patient Position: Sitting, Cuff Size: Large)   Pulse 62   Ht '5\' 10"'$  (1.778 m)   Wt 204 lb 9.6 oz (92.8 kg)   SpO2 97%   BMI 29.36 kg/m     General: Alert, oriented x3, no distress, overweight.  Healthy  Pacemaker site Head: no evidence of trauma, PERRL, EOMI, no exophtalmos or lid lag, no myxedema, no xanthelasma; normal ears, nose and oropharynx Neck: normal jugular venous pulsations and no hepatojugular reflux; brisk carotid pulses without delay and no carotid bruits Chest: clear to auscultation, no signs of consolidation by percussion or palpation, normal fremitus, symmetrical and full respiratory excursions Cardiovascular: normal position and quality of the apical impulse, irregular rhythm, normal first and second heart sounds, no murmurs, rubs or gallops Abdomen: no tenderness or distention, no masses by palpation, no abnormal pulsatility or arterial bruits, normal bowel sounds, no hepatosplenomegaly Extremities: no clubbing, cyanosis or edema; 2+ radial, ulnar and brachial pulses bilaterally; 2+ right femoral, posterior tibial and dorsalis pedis pulses; 2+ left femoral, posterior tibial and dorsalis pedis pulses; no subclavian or femoral bruits Neurological: grossly nonfocal Psych: Normal mood and affect    ASSESSMENT & PLAN:    1. Coronary artery disease of bypass graft of native heart with stable angina pectoris ([place])   2. Permanent atrial fibrillation ([place])   3. Pacemaker   4. Acquired thrombophilia ([place])   5. Neurocardiogenic syncope   6. Hyperlipidemia LDL goal <70   7. Type 2 diabetes mellitus without complication, without long-term current use of insulin ([place])   8. Nephrolithiasis        CAD s/p CABG and PCI x4 w recurrent angina: He is on close to maximal medical therapy including beta-blocker, calcium channel blocker, isosorbide all in relatively high doses (we decreased his diltiazem dose since he had excessive ventricular pacing).  He has done well and would like to stop the Ranexa.  Nuclear stress test did not show reversible ischemia.  I think we can stop the Ranexa.  He has not had a revascularization procedure in over 2 years and can safely stop his antiplatelet agents for the planned lithotripsy.   AFib: Permanent arrhythmia, well rate controlled. PM: Normal device function.  Continue remote downloads every 3 months.  He is not pacemaker dependent.  His pacemaker was initially implanted for tachycardia-bradycardia syndrome and he now has permanent atrial fibrillation.  The device has proven to be excellent treatment for his previous frequent episodes of neurally mediated syncope.  Continue downloads every 3 months Anticoagulation: Moderate embolic risk. CHADSVasc 3-4 (HTN, DM, CAD, almost 34).  Tolerating warfarin plus clopidogrel without bleeding complications.  Okay to stop the warfarin for 5 to 7 days before lithotripsy.  I do not think he will require "bridging". Neurally mediated syncope: Asymptomatic since pacemaker implantation (he has not had any episodes of syncope or even near syncope).  Seems to be tolerating vasodilator therapy okay. HLP: HDL remains quite low, although it has improved substantially from the year before.  Congratulated on weight loss.   DM: Glycemic control is adequate with an A1c of 7.4%.  LDL is in target range.  Continue statin. Symptomatic nephrolithiasis: Reportedly normal lithotripsy.  I think he can safely stop the clopidogrel and warfarin for 5-7 days before this procedure.  I do not think he requires "bridging" with enoxaparin   Risk Assessment/Calculations:  CHA2DS2-VASc Score = 4  This indicates a 4.8% annual risk of  stroke. The patient's score is based upon: CHF History: 0 HTN History: 1 Diabetes History: 1 Stroke History: 0 Vascular Disease History: 1 Age Score: 1 Gender Score: 0      Shared Decision Making/Informed Consent The risks [chest pain, shortness of breath, cardiac arrhythmias, dizziness, blood pressure fluctuations, myocardial infarction, stroke/transient ischemic attack, nausea, vomiting, allergic reaction, radiation exposure, metallic taste sensation and life-threatening complications (estimated to be 1 in 10,000)], benefits (risk stratification, diagnosing coronary artery disease, treatment guidance) and alternatives of a nuclear stress test were discussed in detail with Mr. Harari and he agrees to proceed.       Medication Adjustments/Labs and Tests Ordered: Current medicines are reviewed at length with the patient today.  Concerns regarding medicines are outlined above.  No orders of the defined types were placed in this encounter.  No orders of the defined types were placed in this encounter.   Patient Instructions  Medication Instructions:  STOP the Ranexa  *If you need a refill on your cardiac medications before your next appointment, please call your pharmacy*   Lab Work: None ordered If you have labs (blood work) drawn today and your tests are completely normal, you will receive your results only by: [place] (if you have MyChart) OR A paper copy in the mail If you have any lab test that is abnormal or we need to change your treatment, we will call you to review the results.   Testing/Procedures: None ordered   Follow-Up: At [facility], you and your health needs are our priority.  As part of our continuing mission to provide you with exceptional heart care, we have created designated Provider Care Teams.  These Care Teams include your primary Cardiologist (physician) and Advanced Practice Providers (APPs -  Physician Assistants and Nurse  Practitioners) who all work together to provide you with the care you need, when you need it.  We recommend signing up for the patient portal called "MyChart".  Sign up information is provided on this After Visit Summary.  MyChart is used to connect with patients for Virtual Visits (Telemedicine).  Patients are able to view lab/test results, encounter notes, upcoming appointments, etc.  Non-urgent messages can be sent to your provider as well.   To learn more about what you can do with MyChart, go to [email or web address].    Your next appointment:   12 month(s)  The format for your next appointment:   In Person  Provider:   [name], MD {    Important Information About Sugar         Signed, [name], MD  03/17/2022 5:41 PM    [place]

## 2022-03-27 ENCOUNTER — Telehealth: Payer: Self-pay | Admitting: Cardiovascular Disease

## 2022-03-27 NOTE — Telephone Encounter (Signed)
   Pre-operative Risk Assessment    Patient Name: Lucas Porter  DOB: [birth year] MRN: [id number]      Request for Surgical Clearance    Procedure:  shock wave lithotripsy   Date of Surgery:  Clearance TBD                                 Surgeon:  Dr. Arnette Schaumann Surgeon's Group or Practice Name:  [facility] Phone number:  [phone] Fax number:  [phone]   Type of Clearance Requested:   - Medical  - Pharmacy:  Hold Clopidogrel (Plavix) and Warfarin (Coumadin) 5 days prior to surgery   Type of Anesthesia:  MAC or Local   Additional requests/questions:  will also fax a pacemaker protocol form   Signed, Selena Zobro   03/27/2022, 12:15 PM

## 2022-03-27 NOTE — Telephone Encounter (Signed)
Per Dr. Victorino December note from 03/12/22, patient may hold Plavix and Coumadin for 5 days prior to lithotripsy without bridging. Will route to pharmacy for official protocol to hold Coumadin.

## 2022-03-29 NOTE — Telephone Encounter (Signed)
Patient with diagnosis of afib on warfarin for anticoagulation.    Procedure: shock wave lithotripsy Date of procedure: TBD  CHA2DS2-VASc Score = 4  This indicates a 4.8% annual risk of stroke. The patient's score is based upon: CHF History: 0 HTN History: 1 Diabetes History: 1 Stroke History: 0 Vascular Disease History: 1 Age Score: 1 Gender Score: 0   CrCl >187m/min Platelet count 239K  Per office protocol, patient can hold warfarin for 5 days prior to procedure. Patient will not need bridging with Lovenox around procedure.  **This guidance is not considered finalized until pre-operative APP has relayed final recommendations.**

## 2022-03-30 NOTE — Telephone Encounter (Signed)
   Patient Name: Lucas Porter  DOB: [birth year] MRN: [id number]  Primary Cardiologist: [name], MD  Chart reviewed as part of pre-operative protocol coverage. Pre-op clearance already addressed by colleagues in earlier phone notes. To summarize recommendations:  -Tolerating warfarin plus clopidogrel without bleeding complications.  Okay to stop the warfarin and plavix for 5 days before lithotripsy.  I do not think he will require "bridging".  Please resume when medically safe to do so.   Recently saw Dr. Sallyanne Kuster in the office and was doing well at that time.  Will route this bundled recommendation to requesting provider via Epic fax function and remove from pre-op pool. Please call with questions.  Elgie Collard, PA-C 03/30/2022, 8:30 AM

## 2022-04-02 ENCOUNTER — Ambulatory Visit: Payer: Medicare Other

## 2022-04-02 DIAGNOSIS — Z7901 Long term (current) use of anticoagulants: Secondary | ICD-10-CM

## 2022-04-02 DIAGNOSIS — I480 Paroxysmal atrial fibrillation: Secondary | ICD-10-CM

## 2022-04-02 LAB — POCT INR: INR: 2.6 (ref 2.0–3.0)

## 2022-04-02 NOTE — Patient Instructions (Signed)
continue  1/2 tablet everyday except 1 tablet on Wednesday.   Stay consistent with greens (2-3 per week)  Repeat INR in 6 weeks. Call [phone] if for questions

## 2022-04-09 ENCOUNTER — Other Ambulatory Visit: Payer: Self-pay | Admitting: Urology

## 2022-04-10 ENCOUNTER — Telehealth: Payer: Self-pay

## 2022-04-10 NOTE — Telephone Encounter (Signed)
-----   Message from [name], MD sent at 04/09/2022  3:39 PM EDT ----- Regarding: RE: lithotripsy OK to "clear" for lithotripsy. The only issue that could occur in this non-dependent patient is increased ventricular pacing rate due to the sensor response. Ideally, can switch from VVIR to VVI to avoid this, but it is not critical. ----- Message ----- From: Benedetto Goad, RN Sent: 04/09/2022   3:33 PM EDT To: [name], MD Subject: lithotripsy                                    Good Afternoon Dr. Loletha Grayer,  Received a device clearance from for the above patient to have lithotripsy done, date pending. Discussed with [employer] rep Kandra Nicolas who advises literature says it is contraindicated however this patient has a PPM and is not dependent per your note 03/12/22. It is ultimately your decision if you would advise patient to have this procedure. I will hold off on clearance at this time until you get back with me.   Thanks, [employer]

## 2022-04-12 NOTE — Progress Notes (Signed)
Left message for patient to return call.

## 2022-04-12 NOTE — Progress Notes (Signed)
Pre op phone call completed. Spoke with patients spouse.  Meds stopped at appropriate times.  Pt is aware he is NPO after midnight and to arrive at 0600 on Monday.

## 2022-04-13 NOTE — H&P (Signed)
Office Visit Report     03/22/2022   --------------------------------------------------------------------------------   Lucas Porter. Buttery  MRN: [id number]  DOB: [birth year], 66 year old Male  SSN: [id number]   PRIMARY CARE:    REFERRING:  Ihor Dow, MD  PROVIDER:  Jacalyn Lefevre, M.D.  LOCATION:  [facility] [phone]     --------------------------------------------------------------------------------   CC/HPI: cc: urolithiasis   03/22/22: 66 yo man with hx of urolithiasis found to have a 5 x 12 mm left renal pelvis stone on CT scan done at outside facility [facility]). He had subsequent KUB which did show that it was visible. Patient is having some mild discomfort but not severe pain like prior stones. He is on warfarin but was told by his cardiologist that he is okay to come off of this. He also has a pacemaker. He was previously having episodes of emesis but this subsided. He also has a smaller nonobstructing stone on the right-hand side approximately 3 mm. No fever or chills.     ALLERGIES: None   MEDICATIONS: Lisinopril 20 mg tablet  Metformin Hcl  Metoprolol Tartrate 100 mg tablet  Warfarin Sodium 5 mg tablet  Acetaminophen  Atenolol 100 mg tablet  Clonazepam 0.5 mg tablet  Clopidogrel 75 mg tablet  Diltiazem 24Hr Er (Cd) 180 mg capsule, ext release 24 hr  Dofetilide 500 mcg capsule  Fish Oil  Flonase Allergy Relief  Isosorbide Mononitrate Er 60 mg tablet, extended release 24 hr  Klor-Con  Melatonin  Mucinex  Nitroglycerin 0.4 mg tablet, sublingual  Ondansetron Hcl  Pantoprazole Sodium  Rosuvastatin Calcium 40 mg tablet  Venlafaxine Hcl 75 mg tablet  Venlafaxine Hcl Er 150 mg capsule, ext release 24 hr     GU PSH: None   NON-GU PSH: CABG (coronary artery bypass grafting) Cardiac Stent Placement Heart Pacemaker, Insert Heart Surgery (Unspecified)     GU PMH: BPH w/LUTS, He is emptying well but has a variable stream. I am going to follow  his PSA since he is so hypogonadol that it should be lower and I want to make sure it isn't rising further. - 2018 Nocturia, I am going to have him cut out caffeine as best he can and drink water and will give him oxybutynin IR '5mg'$  at bedtime. He will return in 3 months for a PVR and flowrate and will try to get a urine today. - 2018 Primary hypogonadism, His testosterone level has been quite low. I am going to repeat the levels to see if the are rebounding after being on TRT for a year. I will consider a more complete endocrine w/u. - 2018      PMH Notes: Bleeding disorder   NON-GU PMH: Anxiety Arrhythmia Arthritis Atrial Fibrillation Depression Diabetes Type 2 Heart disease, unspecified Hypercholesterolemia Hypertension Myocardial Infarction Sleep Apnea    FAMILY HISTORY: 2 daughters - Other Alzheimer's Disease - Father Colon Cancer - Mother Heart Attack - Father Kidney Stones - Brother stroke - Brother   SOCIAL HISTORY: Marital Status: Married Preferred Language: English; Race: White Current Smoking Status: Patient has never smoked.   Tobacco Use Assessment Completed: Used Tobacco in last 30 days? Drinks 2 caffeinated drinks per day. Patient's occupation is/was Retired.     Notes: He is drinking 2 cups of coffee and tea and soda through the day.    REVIEW OF SYSTEMS:    GU Review Male:   Patient reports hard to postpone urination and get up at night to urinate. Patient  denies frequent urination, burning/ pain with urination, leakage of urine, stream starts and stops, trouble starting your stream, have to strain to urinate , erection problems, and penile pain.  Gastrointestinal (Upper):   Patient reports nausea and vomiting. Patient denies indigestion/ heartburn.  Gastrointestinal (Lower):   Patient reports diarrhea. Patient denies constipation.  Constitutional:   Patient reports fatigue. Patient denies fever, night sweats, and weight loss.  Skin:   Patient denies skin  rash/ lesion and itching.  Eyes:   Patient denies blurred vision and double vision.  Ears/ Nose/ Throat:   Patient denies sore throat and sinus problems.  Hematologic/Lymphatic:   Patient reports easy bruising. Patient denies swollen glands.  Cardiovascular:   Patient reports chest pains. Patient denies leg swelling.  Respiratory:   Patient reports shortness of breath. Patient denies cough.  Endocrine:   Patient denies excessive thirst.  Musculoskeletal:   Patient reports back pain. Patient denies joint pain.  Neurological:   Patient denies headaches and dizziness.  Psychologic:   Patient reports depression and anxiety.    VITAL SIGNS:      03/22/2022 11:11 AM  Weight 205 lb / 92.99 kg  Height 70 in / 177.8 cm  BP 166/87 mmHg  Pulse 65 /min  BMI 29.4 kg/m  Notes: Blood pressure taken in right arm- patient has pacemaker device   MULTI-SYSTEM PHYSICAL EXAMINATION:    Constitutional: Well-nourished. No physical deformities. Normally developed. Good grooming.  Neck: Neck symmetrical, not swollen. Normal tracheal position.  Respiratory: No labored breathing, no use of accessory muscles.   Skin: No paleness, no jaundice, no cyanosis. No lesion, no ulcer, no rash.  Neurologic / Psychiatric: Oriented to time, oriented to place, oriented to person. No depression, no anxiety, no agitation.  Eyes: Normal conjunctivae. Normal eyelids.  Ears, Nose, Mouth, and Throat: Left ear no scars, no lesions, no masses. Right ear no scars, no lesions, no masses. Nose no scars, no lesions, no masses. Normal hearing. Normal lips.  Musculoskeletal: Normal gait and station of head and neck.     Complexity of Data:  Records Review:   Previous Patient Records, POC Tool  Urine Test Review:   Urinalysis  X-Ray Review: C.T. Abdomen/Pelvis: Reviewed Films. Reviewed Report. Discussed With Patient. 5 x 12 left renal pelvis calculus    03/25/17  PSA  Total PSA 1.10 ng/mL    03/25/17 12/24/16  Hormones   Testosterone, Total 145.5 ng/dL 155.7 pg/dL   Notes:                     01/10/2021: BUN 21, creatinine 0.79, estimated GFR 99   PROCEDURES:          Urinalysis w/Scope Dipstick Dipstick Cont'd Micro  Color: Yellow Bilirubin: Neg mg/dL WBC/hpf: 0 - 5/hpf  Appearance: Cloudy Ketones: Neg mg/dL RBC/hpf: >60/hpf  Specific Gravity: 1.025 Blood: 3+ ery/uL Bacteria: Mod (26-50/hpf)  pH: 5.5 Protein: Trace mg/dL Cystals: NS (Not Seen)  Glucose: Neg mg/dL Urobilinogen: 0.2 mg/dL Casts: NS (Not Seen)    Nitrites: Neg Trichomonas: Not Present    Leukocyte Esterase: Neg leu/uL Mucous: Not Present      Epithelial Cells: NS (Not Seen)      Yeast: NS (Not Seen)      Sperm: Not Present    ASSESSMENT:      ICD-10 Details  1 GU:   Renal calculus - N20.0 Chronic, Stable   PLAN:           Document Letter(s):  Created  for Patient: Clinical Summary         Notes:   1. Renal calculi:  -left renal pelvis stone likely causing intermittent hematuria and discomfort  -Patient is interested in ESWL after discussion of risks and benefits as well as alternatives including but not limited to pain, bleeding, inability to treat stone, infection, retroperitoneal hematoma, page kidney, need for additional procedures, need for subsequent ureteroscopy  -We will obtain cardiac clearance to hold warfarin prior to ESWL and patient also needs to follow-up pacemaker protocol   cc: Ihor Dow, MD    * Signed by Jacalyn Lefevre, M.D. on 03/26/22 at 2:38 PM (EDT)*

## 2022-04-16 ENCOUNTER — Encounter (HOSPITAL_BASED_OUTPATIENT_CLINIC_OR_DEPARTMENT_OTHER)
Admission: RE | Disposition: A | Discharge status: Home / Self Care | Payer: Self-pay | Source: Ambulatory Visit | Attending: Urology

## 2022-04-16 ENCOUNTER — Encounter (HOSPITAL_BASED_OUTPATIENT_CLINIC_OR_DEPARTMENT_OTHER): Payer: Self-pay | Admitting: Certified Registered Nurse Anesthetist

## 2022-04-16 ENCOUNTER — Ambulatory Visit (HOSPITAL_BASED_OUTPATIENT_CLINIC_OR_DEPARTMENT_OTHER)
Admission: RE | Admit: 2022-04-16 | Discharge: 2022-04-16 | Disposition: A | Discharge status: Home / Self Care | Payer: Medicare Other | Source: Ambulatory Visit | Attending: Urology | Admitting: Urology

## 2022-04-16 ENCOUNTER — Other Ambulatory Visit: Payer: Self-pay

## 2022-04-16 ENCOUNTER — Ambulatory Visit (HOSPITAL_COMMUNITY): Payer: Medicare Other

## 2022-04-16 ENCOUNTER — Encounter (HOSPITAL_BASED_OUTPATIENT_CLINIC_OR_DEPARTMENT_OTHER): Payer: Self-pay | Admitting: Urology

## 2022-04-16 DIAGNOSIS — I499 Cardiac arrhythmia, unspecified: Secondary | ICD-10-CM | POA: Diagnosis not present

## 2022-04-16 DIAGNOSIS — N200 Calculus of kidney: Secondary | ICD-10-CM | POA: Insufficient documentation

## 2022-04-16 DIAGNOSIS — Z7901 Long term (current) use of anticoagulants: Secondary | ICD-10-CM | POA: Insufficient documentation

## 2022-04-16 DIAGNOSIS — E119 Type 2 diabetes mellitus without complications: Secondary | ICD-10-CM | POA: Diagnosis not present

## 2022-04-16 DIAGNOSIS — Z7984 Long term (current) use of oral hypoglycemic drugs: Secondary | ICD-10-CM | POA: Diagnosis not present

## 2022-04-16 DIAGNOSIS — I10 Essential (primary) hypertension: Secondary | ICD-10-CM | POA: Diagnosis not present

## 2022-04-16 DIAGNOSIS — Z7902 Long term (current) use of antithrombotics/antiplatelets: Secondary | ICD-10-CM | POA: Diagnosis not present

## 2022-04-16 DIAGNOSIS — Z950 Presence of cardiac pacemaker: Secondary | ICD-10-CM | POA: Diagnosis not present

## 2022-04-16 HISTORY — PX: EXTRACORPOREAL SHOCK WAVE LITHOTRIPSY: SHX1557

## 2022-04-16 LAB — GLUCOSE, CAPILLARY: Glucose-Capillary: 134 mg/dL — ABNORMAL HIGH (ref 70–99)

## 2022-04-16 LAB — PROTIME-INR
INR: 1 (ref 0.8–1.2)
Prothrombin Time: 13.3 seconds (ref 11.4–15.2)

## 2022-04-16 SURGERY — Surgical Case
Anesthesia: *Unknown

## 2022-04-16 SURGERY — LITHOTRIPSY, ESWL
Anesthesia: LOCAL | Laterality: Left

## 2022-04-16 MED ORDER — HYDROCODONE-ACETAMINOPHEN 5-325 MG PO TABS: 1.0000 | ORAL_TABLET | Freq: Four times a day (QID) | ORAL | 0 refills | Status: AC | PRN

## 2022-04-16 MED ORDER — DIPHENHYDRAMINE HCL 25 MG PO CAPS
ORAL_CAPSULE | ORAL | Status: AC
  Filled 2022-04-16: qty 1

## 2022-04-16 MED ORDER — DIAZEPAM 5 MG PO TABS
10.0000 mg | ORAL_TABLET | ORAL | Status: AC
  Administered 2022-04-16: 10 mg via ORAL

## 2022-04-16 MED ORDER — DIAZEPAM 5 MG PO TABS
ORAL_TABLET | ORAL | Status: AC
  Filled 2022-04-16: qty 2

## 2022-04-16 MED ORDER — CIPROFLOXACIN HCL 500 MG PO TABS
500.0000 mg | ORAL_TABLET | ORAL | Status: AC
  Administered 2022-04-16: 500 mg via ORAL

## 2022-04-16 MED ORDER — CIPROFLOXACIN HCL 500 MG PO TABS
ORAL_TABLET | ORAL | Status: AC
  Filled 2022-04-16: qty 1

## 2022-04-16 MED ORDER — SODIUM CHLORIDE 0.9 % IV SOLN: INTRAVENOUS | Status: DC

## 2022-04-16 MED ORDER — DIPHENHYDRAMINE HCL 25 MG PO CAPS
25.0000 mg | ORAL_CAPSULE | ORAL | Status: AC
  Administered 2022-04-16: 25 mg via ORAL

## 2022-04-16 MED ORDER — HYDROCODONE-ACETAMINOPHEN 5-325 MG PO TABS: 1.0000 | ORAL_TABLET | Freq: Four times a day (QID) | ORAL | 0 refills | Status: DC | PRN

## 2022-04-16 NOTE — Discharge Instructions (Addendum)
1. You should strain your urine and collect all fragments and bring them to your follow up appointment.  2. You should take your pain medication as needed.  Please call if your pain is severe to the point that it is not controlled with your pain medication. 3. You should call if you develop fever > 101 or persistent nausea or vomiting.    Post Anesthesia Home Care Instructions  Activity: Get plenty of rest for the remainder of the day. A responsible individual must stay with you for 24 hours following the procedure.  For the next 24 hours, DO NOT: -Drive a car -Paediatric nurse -Drink alcoholic beverages -Take any medication unless instructed by your physician -Make any legal decisions or sign important papers.  Meals: Start with liquid foods such as gelatin or soup. Progress to regular foods as tolerated. Avoid greasy, spicy, heavy foods. If nausea and/or vomiting occur, drink only clear liquids until the nausea and/or vomiting subsides. Call your physician if vomiting continues.  Special Instructions/Symptoms: Your throat may feel dry or sore from the anesthesia or the breathing tube placed in your throat during surgery. If this causes discomfort, gargle with warm salt water. The discomfort should disappear within 24 hours.

## 2022-04-16 NOTE — Op Note (Signed)
See [facility] operative note scanned into chart. Also because of the size, density, location and other factors that cannot be anticipated I feel this will likely be a staged procedure. This fact supersedes any indication in the scanned [facility] operative note to the contrary.  

## 2022-04-16 NOTE — Interval H&P Note (Signed)
History and Physical Interval Note:  04/16/2022 7:12 AM  Lucas Porter  has presented today for surgery, with the diagnosis of LEFT RENAL CALCULUS.  The various methods of treatment have been discussed with the patient and family. After consideration of risks, benefits and other options for treatment, the patient has consented to  Procedure(s) with comments: EXTRACORPOREAL SHOCK WAVE LITHOTRIPSY (ESWL) (Left) - NEEDS 90 MINS as a surgical intervention.  The patient's history has been reviewed, patient examined, no change in status, stable for surgery.   He has received appropriate clearance from cardiology to proceed today.  I have reviewed the patient's chart and labs.  Questions were answered to the patient's satisfaction.     [employer]

## 2022-04-17 ENCOUNTER — Encounter (HOSPITAL_BASED_OUTPATIENT_CLINIC_OR_DEPARTMENT_OTHER): Payer: Self-pay | Admitting: Urology

## 2022-05-14 ENCOUNTER — Ambulatory Visit: Payer: Medicare Other | Attending: Cardiovascular Disease

## 2022-05-14 DIAGNOSIS — Z7901 Long term (current) use of anticoagulants: Secondary | ICD-10-CM

## 2022-05-14 DIAGNOSIS — I480 Paroxysmal atrial fibrillation: Secondary | ICD-10-CM

## 2022-05-14 LAB — POCT INR: INR: 4.4 — AB (ref 2.0–3.0)

## 2022-05-14 NOTE — Patient Instructions (Signed)
HOLD TUESDAY and WEDNESDAY and then DECREASE TO 1/2 tablet everyday.   Stay consistent with greens (2-3 per week)  Repeat INR in 2 weeks. Call [phone] if for questions

## 2022-05-22 ENCOUNTER — Ambulatory Visit (INDEPENDENT_AMBULATORY_CARE_PROVIDER_SITE_OTHER): Payer: Medicare Other

## 2022-05-22 DIAGNOSIS — I480 Paroxysmal atrial fibrillation: Secondary | ICD-10-CM | POA: Diagnosis not present

## 2022-05-29 LAB — CUP PACEART REMOTE DEVICE CHECK
Battery Remaining Longevity: 126 mo
Battery Voltage: 3.02 V
Brady Statistic AP VP Percent: 0 %
Brady Statistic AP VS Percent: 0 %
Brady Statistic AS VP Percent: 54.1 %
Brady Statistic AS VS Percent: 45.9 %
Brady Statistic RA Percent Paced: 0 %
Brady Statistic RV Percent Paced: 54.1 %
Date Time Interrogation Session: 20230905014220
Implantable Lead Implant Date: 20120229
Implantable Lead Implant Date: 20120229
Implantable Lead Location: 753859
Implantable Lead Location: 753860
Implantable Pulse Generator Implant Date: 20200605
Lead Channel Impedance Value: 342 Ohm
Lead Channel Impedance Value: 437 Ohm
Lead Channel Impedance Value: 513 Ohm
Lead Channel Impedance Value: 551 Ohm
Lead Channel Pacing Threshold Amplitude: 0.75 V
Lead Channel Pacing Threshold Amplitude: 0.75 V
Lead Channel Pacing Threshold Pulse Width: 0.4 ms
Lead Channel Pacing Threshold Pulse Width: 0.4 ms
Lead Channel Sensing Intrinsic Amplitude: 0.5 mV
Lead Channel Sensing Intrinsic Amplitude: 0.5 mV
Lead Channel Sensing Intrinsic Amplitude: 20.125 mV
Lead Channel Sensing Intrinsic Amplitude: 20.125 mV
Lead Channel Setting Pacing Amplitude: 2.5 V
Lead Channel Setting Pacing Pulse Width: 0.4 ms
Lead Channel Setting Sensing Sensitivity: 2 mV

## 2022-05-30 ENCOUNTER — Ambulatory Visit: Payer: Medicare Other | Attending: Cardiovascular Disease | Admitting: *Deleted

## 2022-05-30 DIAGNOSIS — Z7901 Long term (current) use of anticoagulants: Secondary | ICD-10-CM | POA: Diagnosis not present

## 2022-05-30 DIAGNOSIS — I480 Paroxysmal atrial fibrillation: Secondary | ICD-10-CM

## 2022-05-30 LAB — POCT INR: INR: 1.2 — AB (ref 2.0–3.0)

## 2022-05-30 NOTE — Patient Instructions (Signed)
Description   Today take another 1/2 tablet when you get home and tomorrow take 1 tablet then resume taking 1/2 tablet everyday. Stay consistent with greens (2-3 per week). Repeat INR in 1 week. Call [phone] if for questions

## 2022-06-07 ENCOUNTER — Ambulatory Visit: Payer: Medicare Other | Attending: Cardiovascular Disease | Admitting: *Deleted

## 2022-06-07 DIAGNOSIS — Z7901 Long term (current) use of anticoagulants: Secondary | ICD-10-CM | POA: Diagnosis not present

## 2022-06-07 DIAGNOSIS — I480 Paroxysmal atrial fibrillation: Secondary | ICD-10-CM

## 2022-06-07 LAB — POCT INR: INR: 1.7 — AB (ref 2.0–3.0)

## 2022-06-07 NOTE — Patient Instructions (Addendum)
Description   Today take 1 tablet  (if you have already taken your dose today take another 1/2 tablet) then resume taking 1/2 tablet everyday. Stay consistent with greens (2-3 per week). Repeat INR in 2 weeks. Call [phone] if for questions

## 2022-06-14 NOTE — Progress Notes (Signed)
Remote pacemaker transmission.   

## 2022-06-21 ENCOUNTER — Ambulatory Visit: Payer: Medicare Other | Attending: Cardiovascular Disease | Admitting: *Deleted

## 2022-06-21 DIAGNOSIS — I480 Paroxysmal atrial fibrillation: Secondary | ICD-10-CM | POA: Diagnosis not present

## 2022-06-21 DIAGNOSIS — Z7901 Long term (current) use of anticoagulants: Secondary | ICD-10-CM

## 2022-06-21 LAB — POCT INR: INR: 1.9 — AB (ref 2.0–3.0)

## 2022-06-21 NOTE — Patient Instructions (Signed)
Description   Today take 1 tablet  (if you have already taken your dose today take another 1/2 tablet) then start taking 1/2 tablet everyday except 1 tablet on Thursdays. Stay consistent with greens (2-3 per week). Repeat INR in 3 weeks. Call [phone] if for questions

## 2022-07-04 ENCOUNTER — Other Ambulatory Visit: Payer: Self-pay | Admitting: Cardiovascular Disease

## 2022-07-11 ENCOUNTER — Ambulatory Visit: Payer: Medicare Other | Attending: Cardiovascular Disease | Admitting: *Deleted

## 2022-07-11 DIAGNOSIS — I480 Paroxysmal atrial fibrillation: Secondary | ICD-10-CM

## 2022-07-11 DIAGNOSIS — Z7901 Long term (current) use of anticoagulants: Secondary | ICD-10-CM | POA: Diagnosis not present

## 2022-07-11 LAB — POCT INR: INR: 4.2 — AB (ref 2.0–3.0)

## 2022-07-11 NOTE — Patient Instructions (Signed)
Description   Do not take any warfarin today then start taking 1/2 tablet everyday. Stay consistent with greens (2-3 per week). Repeat INR in 2 weeks. Call [phone] if any questions

## 2022-07-25 ENCOUNTER — Ambulatory Visit: Payer: Medicare Other | Attending: Cardiovascular Disease | Admitting: *Deleted

## 2022-07-25 DIAGNOSIS — I480 Paroxysmal atrial fibrillation: Secondary | ICD-10-CM | POA: Diagnosis not present

## 2022-07-25 DIAGNOSIS — Z7901 Long term (current) use of anticoagulants: Secondary | ICD-10-CM

## 2022-07-25 LAB — POCT INR: INR: 1.6 — AB (ref 2.0–3.0)

## 2022-07-25 NOTE — Patient Instructions (Signed)
Description   Today take 1 tablet then continue taking 1/2 tablet everyday. Repeat INR in 2 weeks. Call [phone] if any questions

## 2022-08-08 ENCOUNTER — Ambulatory Visit: Payer: Medicare Other | Attending: Cardiology | Admitting: *Deleted

## 2022-08-08 DIAGNOSIS — Z7901 Long term (current) use of anticoagulants: Secondary | ICD-10-CM | POA: Diagnosis not present

## 2022-08-08 DIAGNOSIS — I480 Paroxysmal atrial fibrillation: Secondary | ICD-10-CM

## 2022-08-08 LAB — POCT INR: INR: 2.2 (ref 2.0–3.0)

## 2022-08-08 NOTE — Patient Instructions (Signed)
Description   Continue taking 1/2 tablet everyday. Repeat INR in 3 weeks. Call [phone] if any questions

## 2022-08-21 ENCOUNTER — Ambulatory Visit (INDEPENDENT_AMBULATORY_CARE_PROVIDER_SITE_OTHER): Payer: Medicare Other

## 2022-08-21 DIAGNOSIS — Z950 Presence of cardiac pacemaker: Secondary | ICD-10-CM

## 2022-08-21 DIAGNOSIS — I495 Sick sinus syndrome: Secondary | ICD-10-CM | POA: Diagnosis not present

## 2022-08-21 LAB — CUP PACEART REMOTE DEVICE CHECK
Battery Remaining Longevity: 123 mo
Battery Voltage: 3.02 V
Brady Statistic AP VP Percent: 0 %
Brady Statistic AP VS Percent: 0 %
Brady Statistic AS VP Percent: 49.25 %
Brady Statistic AS VS Percent: 50.75 %
Brady Statistic RA Percent Paced: 0 %
Brady Statistic RV Percent Paced: 49.25 %
Date Time Interrogation Session: 20231205004025
Implantable Lead Connection Status: 753985
Implantable Lead Connection Status: 753985
Implantable Lead Implant Date: 20120229
Implantable Lead Implant Date: 20120229
Implantable Lead Location: 753859
Implantable Lead Location: 753860
Implantable Pulse Generator Implant Date: 20200605
Lead Channel Impedance Value: 342 Ohm
Lead Channel Impedance Value: 475 Ohm
Lead Channel Impedance Value: 532 Ohm
Lead Channel Impedance Value: 551 Ohm
Lead Channel Pacing Threshold Amplitude: 0.75 V
Lead Channel Pacing Threshold Amplitude: 0.75 V
Lead Channel Pacing Threshold Pulse Width: 0.4 ms
Lead Channel Pacing Threshold Pulse Width: 0.4 ms
Lead Channel Sensing Intrinsic Amplitude: 0.5 mV
Lead Channel Sensing Intrinsic Amplitude: 0.5 mV
Lead Channel Sensing Intrinsic Amplitude: 24 mV
Lead Channel Sensing Intrinsic Amplitude: 24 mV
Lead Channel Setting Pacing Amplitude: 2.5 V
Lead Channel Setting Pacing Pulse Width: 0.4 ms
Lead Channel Setting Sensing Sensitivity: 2 mV
Zone Setting Status: 755011

## 2022-08-27 ENCOUNTER — Ambulatory Visit: Payer: Medicare Other | Attending: Cardiology

## 2022-08-27 DIAGNOSIS — I480 Paroxysmal atrial fibrillation: Secondary | ICD-10-CM

## 2022-08-27 DIAGNOSIS — Z5181 Encounter for therapeutic drug level monitoring: Secondary | ICD-10-CM

## 2022-08-27 DIAGNOSIS — Z7901 Long term (current) use of anticoagulants: Secondary | ICD-10-CM | POA: Diagnosis not present

## 2022-08-27 LAB — POCT INR: INR: 2.4 (ref 2.0–3.0)

## 2022-08-27 NOTE — Patient Instructions (Signed)
Continue taking 1/2 tablet everyday. Repeat INR in 5 weeks. Call [phone] if any questions

## 2022-09-07 ENCOUNTER — Other Ambulatory Visit: Payer: Self-pay | Admitting: Cardiovascular Disease

## 2022-09-07 DIAGNOSIS — I480 Paroxysmal atrial fibrillation: Secondary | ICD-10-CM

## 2022-09-19 NOTE — Progress Notes (Signed)
Remote pacemaker transmission.   

## 2022-10-01 ENCOUNTER — Ambulatory Visit: Payer: Medicare HMO | Attending: Cardiology

## 2022-10-01 DIAGNOSIS — I480 Paroxysmal atrial fibrillation: Secondary | ICD-10-CM

## 2022-10-01 DIAGNOSIS — Z5181 Encounter for therapeutic drug level monitoring: Secondary | ICD-10-CM

## 2022-10-01 DIAGNOSIS — Z7901 Long term (current) use of anticoagulants: Secondary | ICD-10-CM

## 2022-10-01 LAB — POCT INR: INR: 1.8 — AB (ref 2.0–3.0)

## 2022-10-01 NOTE — Patient Instructions (Signed)
TAKE 1 TABLET TODAY ONLY THEN Continue taking 1/2 tablet everyday. Repeat INR in 5 weeks. Call [phone] if any questions

## 2022-11-05 ENCOUNTER — Ambulatory Visit: Payer: Medicare HMO | Attending: Cardiovascular Disease | Admitting: *Deleted

## 2022-11-05 DIAGNOSIS — I480 Paroxysmal atrial fibrillation: Secondary | ICD-10-CM | POA: Diagnosis not present

## 2022-11-05 DIAGNOSIS — Z7901 Long term (current) use of anticoagulants: Secondary | ICD-10-CM | POA: Diagnosis not present

## 2022-11-05 LAB — POCT INR: INR: 2.1 (ref 2.0–3.0)

## 2022-11-05 NOTE — Patient Instructions (Signed)
Description   Continue taking warfarin 1/2 tablet everyday. Repeat INR in 5 weeks. Call [phone] if any questions

## 2022-11-20 ENCOUNTER — Ambulatory Visit (INDEPENDENT_AMBULATORY_CARE_PROVIDER_SITE_OTHER): Payer: Medicare Other

## 2022-11-20 DIAGNOSIS — I495 Sick sinus syndrome: Secondary | ICD-10-CM

## 2022-11-20 LAB — CUP PACEART REMOTE DEVICE CHECK
Battery Remaining Longevity: 120 mo
Battery Voltage: 3.02 V
Brady Statistic AP VP Percent: 0 %
Brady Statistic AP VS Percent: 0 %
Brady Statistic AS VP Percent: 41.54 %
Brady Statistic AS VS Percent: 58.46 %
Brady Statistic RA Percent Paced: 0 %
Brady Statistic RV Percent Paced: 41.54 %
Date Time Interrogation Session: 20240305005001
Implantable Lead Connection Status: 753985
Implantable Lead Connection Status: 753985
Implantable Lead Implant Date: 20120229
Implantable Lead Implant Date: 20120229
Implantable Lead Location: 753859
Implantable Lead Location: 753860
Implantable Pulse Generator Implant Date: 20200605
Lead Channel Impedance Value: 342 Ohm
Lead Channel Impedance Value: 456 Ohm
Lead Channel Impedance Value: 513 Ohm
Lead Channel Impedance Value: 551 Ohm
Lead Channel Pacing Threshold Amplitude: 0.75 V
Lead Channel Pacing Threshold Amplitude: 0.75 V
Lead Channel Pacing Threshold Pulse Width: 0.4 ms
Lead Channel Pacing Threshold Pulse Width: 0.4 ms
Lead Channel Sensing Intrinsic Amplitude: 0.5 mV
Lead Channel Sensing Intrinsic Amplitude: 0.5 mV
Lead Channel Sensing Intrinsic Amplitude: 28.25 mV
Lead Channel Sensing Intrinsic Amplitude: 28.25 mV
Lead Channel Setting Pacing Amplitude: 2.5 V
Lead Channel Setting Pacing Pulse Width: 0.4 ms
Lead Channel Setting Sensing Sensitivity: 2 mV
Zone Setting Status: 755011

## 2022-12-10 ENCOUNTER — Ambulatory Visit: Payer: Medicare Other | Attending: Cardiovascular Disease | Admitting: Pharmacist

## 2022-12-10 DIAGNOSIS — I480 Paroxysmal atrial fibrillation: Secondary | ICD-10-CM

## 2022-12-10 DIAGNOSIS — Z7901 Long term (current) use of anticoagulants: Secondary | ICD-10-CM

## 2022-12-10 LAB — POCT INR: INR: 2.5 (ref 2.0–3.0)

## 2022-12-10 NOTE — Patient Instructions (Signed)
Description   Continue taking warfarin 1/2 tablet everyday. Repeat INR in 6 weeks. Call [phone] if any questions

## 2022-12-26 NOTE — Progress Notes (Signed)
Remote pacemaker transmission.   

## 2023-01-21 ENCOUNTER — Ambulatory Visit: Payer: Medicare Other | Attending: Cardiovascular Disease

## 2023-01-21 DIAGNOSIS — Z7901 Long term (current) use of anticoagulants: Secondary | ICD-10-CM | POA: Diagnosis not present

## 2023-01-21 DIAGNOSIS — I480 Paroxysmal atrial fibrillation: Secondary | ICD-10-CM

## 2023-01-21 LAB — POCT INR: INR: 3.2 — AB (ref 2.0–3.0)

## 2023-01-21 NOTE — Patient Instructions (Signed)
Description   Skip tomorrow's dosage of warfarin, then resume same dosage  1/2 tablet everyday. Repeat INR in 6 weeks. Call [phone] if any questions

## 2023-01-30 ENCOUNTER — Other Ambulatory Visit: Payer: Self-pay | Admitting: Cardiovascular Disease

## 2023-01-30 ENCOUNTER — Other Ambulatory Visit: Payer: Self-pay | Admitting: Physician Assistant

## 2023-02-19 ENCOUNTER — Ambulatory Visit (INDEPENDENT_AMBULATORY_CARE_PROVIDER_SITE_OTHER): Payer: Medicare Other

## 2023-02-19 ENCOUNTER — Other Ambulatory Visit: Payer: Self-pay | Admitting: Cardiovascular Disease

## 2023-02-19 DIAGNOSIS — I480 Paroxysmal atrial fibrillation: Secondary | ICD-10-CM | POA: Diagnosis not present

## 2023-02-19 LAB — CUP PACEART REMOTE DEVICE CHECK
Battery Remaining Longevity: 116 mo
Battery Voltage: 3.01 V
Brady Statistic AP VP Percent: 0 %
Brady Statistic AP VS Percent: 0 %
Brady Statistic AS VP Percent: 50.57 %
Brady Statistic AS VS Percent: 49.43 %
Brady Statistic RA Percent Paced: 0 %
Brady Statistic RV Percent Paced: 50.57 %
Date Time Interrogation Session: 20240604014308
Implantable Lead Connection Status: 753985
Implantable Lead Connection Status: 753985
Implantable Lead Implant Date: 20120229
Implantable Lead Implant Date: 20120229
Implantable Lead Location: 753859
Implantable Lead Location: 753860
Implantable Pulse Generator Implant Date: 20200605
Lead Channel Impedance Value: 342 Ohm
Lead Channel Impedance Value: 437 Ohm
Lead Channel Impedance Value: 494 Ohm
Lead Channel Impedance Value: 551 Ohm
Lead Channel Pacing Threshold Amplitude: 0.75 V
Lead Channel Pacing Threshold Amplitude: 0.875 V
Lead Channel Pacing Threshold Pulse Width: 0.4 ms
Lead Channel Pacing Threshold Pulse Width: 0.4 ms
Lead Channel Sensing Intrinsic Amplitude: 0.5 mV
Lead Channel Sensing Intrinsic Amplitude: 0.5 mV
Lead Channel Sensing Intrinsic Amplitude: 22.375 mV
Lead Channel Sensing Intrinsic Amplitude: 22.375 mV
Lead Channel Setting Pacing Amplitude: 2.5 V
Lead Channel Setting Pacing Pulse Width: 0.4 ms
Lead Channel Setting Sensing Sensitivity: 2 mV
Zone Setting Status: 755011

## 2023-02-21 ENCOUNTER — Other Ambulatory Visit: Payer: Self-pay | Admitting: Cardiovascular Disease

## 2023-02-21 ENCOUNTER — Other Ambulatory Visit: Payer: Self-pay | Admitting: Physician Assistant

## 2023-03-04 ENCOUNTER — Ambulatory Visit: Payer: Medicare Other | Attending: Cardiovascular Disease

## 2023-03-04 DIAGNOSIS — I480 Paroxysmal atrial fibrillation: Secondary | ICD-10-CM | POA: Diagnosis not present

## 2023-03-04 DIAGNOSIS — Z7901 Long term (current) use of anticoagulants: Secondary | ICD-10-CM | POA: Diagnosis not present

## 2023-03-04 LAB — POCT INR: INR: 2 (ref 2.0–3.0)

## 2023-03-04 NOTE — Patient Instructions (Signed)
Patient was instructed by [facility] to reduce Warfarin dose to 2 mg Daily, due to elevated INR recently.  Resume to 2.5 mg Daily (0.5 tablet). Repeat INR in 3 weeks. Call [phone] if any questions

## 2023-03-14 NOTE — Progress Notes (Signed)
Remote pacemaker transmission.   

## 2023-03-25 ENCOUNTER — Ambulatory Visit: Payer: Medicare Other | Attending: Cardiology | Admitting: *Deleted

## 2023-03-25 DIAGNOSIS — Z7901 Long term (current) use of anticoagulants: Secondary | ICD-10-CM

## 2023-03-25 DIAGNOSIS — I480 Paroxysmal atrial fibrillation: Secondary | ICD-10-CM | POA: Diagnosis not present

## 2023-03-25 LAB — POCT INR: INR: 3.7 — AB (ref 2.0–3.0)

## 2023-03-25 NOTE — Patient Instructions (Signed)
Description   Do not take any warfarin tomorrow (already taken dose today) then continue taking 2.5mg  daily (0.5 tablet). Repeat INR in 3 weeks. Call [phone] if any questions

## 2023-04-16 ENCOUNTER — Ambulatory Visit: Payer: Medicare Other | Attending: Cardiovascular Disease

## 2023-04-16 DIAGNOSIS — Z7901 Long term (current) use of anticoagulants: Secondary | ICD-10-CM

## 2023-04-16 DIAGNOSIS — I480 Paroxysmal atrial fibrillation: Secondary | ICD-10-CM | POA: Diagnosis not present

## 2023-04-16 LAB — POCT INR: INR: 1.6 — AB (ref 2.0–3.0)

## 2023-04-16 NOTE — Patient Instructions (Signed)
TAKE 1 TABLET TODAY ONLY then continue taking 2.5mg  daily (0.5 tablet). Repeat INR in 3 weeks. Call [phone] if any questions; Have surgical center fax to cardiology office.  [place] [facility] office;

## 2023-04-24 ENCOUNTER — Other Ambulatory Visit: Payer: Self-pay | Admitting: Cardiovascular Disease

## 2023-05-07 ENCOUNTER — Ambulatory Visit: Payer: Medicare Other | Attending: Cardiology | Admitting: *Deleted

## 2023-05-07 DIAGNOSIS — Z7901 Long term (current) use of anticoagulants: Secondary | ICD-10-CM | POA: Diagnosis not present

## 2023-05-07 DIAGNOSIS — I480 Paroxysmal atrial fibrillation: Secondary | ICD-10-CM

## 2023-05-07 LAB — POCT INR: INR: 2.1 (ref 2.0–3.0)

## 2023-05-07 NOTE — Patient Instructions (Signed)
Description   Continue taking 2.5mg  daily (0.5 tablet). Repeat INR in 4 weeks. Call [phone] if any questions; Have surgical center fax to cardiology office. [facility] office;

## 2023-05-21 ENCOUNTER — Ambulatory Visit (INDEPENDENT_AMBULATORY_CARE_PROVIDER_SITE_OTHER): Payer: Medicare Other

## 2023-05-21 DIAGNOSIS — I480 Paroxysmal atrial fibrillation: Secondary | ICD-10-CM

## 2023-05-21 LAB — CUP PACEART REMOTE DEVICE CHECK
Battery Remaining Longevity: 115 mo
Battery Voltage: 3.02 V
Brady Statistic AP VP Percent: 0 %
Brady Statistic AP VS Percent: 0 %
Brady Statistic AS VP Percent: 37.55 %
Brady Statistic AS VS Percent: 62.45 %
Brady Statistic RA Percent Paced: 0 %
Brady Statistic RV Percent Paced: 37.55 %
Date Time Interrogation Session: 20240903014934
Implantable Lead Connection Status: 753985
Implantable Lead Connection Status: 753985
Implantable Lead Implant Date: 20120229
Implantable Lead Implant Date: 20120229
Implantable Lead Location: 753859
Implantable Lead Location: 753860
Implantable Pulse Generator Implant Date: 20200605
Lead Channel Impedance Value: 342 Ohm
Lead Channel Impedance Value: 475 Ohm
Lead Channel Impedance Value: 551 Ohm
Lead Channel Impedance Value: 551 Ohm
Lead Channel Pacing Threshold Amplitude: 0.75 V
Lead Channel Pacing Threshold Amplitude: 0.875 V
Lead Channel Pacing Threshold Pulse Width: 0.4 ms
Lead Channel Pacing Threshold Pulse Width: 0.4 ms
Lead Channel Sensing Intrinsic Amplitude: 0.5 mV
Lead Channel Sensing Intrinsic Amplitude: 0.5 mV
Lead Channel Sensing Intrinsic Amplitude: 26.5 mV
Lead Channel Sensing Intrinsic Amplitude: 26.5 mV
Lead Channel Setting Pacing Amplitude: 2.5 V
Lead Channel Setting Pacing Pulse Width: 0.4 ms
Lead Channel Setting Sensing Sensitivity: 2 mV
Zone Setting Status: 755011

## 2023-05-22 ENCOUNTER — Telehealth: Payer: Self-pay

## 2023-05-22 NOTE — Telephone Encounter (Signed)
Scheduled remote reviewed. Normal device function.   Known AF, on OAC, programmed VVI 262 high ventricular rates that are irregular and most likely AF with RVR, sent to triage Next remote 91 days.  ML, CVRS  262 episodes of device flagged NSVT since 02/19/2023 remote transmission. Overall morphology appears same as prior transmissions which correlate with AF/RVR. Overall controlled V rates.  Flagging for Dr. Salena Saner and his team due to patient being past due for routine follow up in office.

## 2023-05-22 NOTE — Telephone Encounter (Signed)
Thanks - we are bringing him in

## 2023-05-24 NOTE — Telephone Encounter (Signed)
Called and left a message stating that we were able to get him in for an appt 05/30/23 with Dr Royann Shivers. Asked pt to let us know if he is unable to make it to this appt.

## 2023-05-25 ENCOUNTER — Other Ambulatory Visit: Payer: Self-pay | Admitting: Cardiovascular Disease

## 2023-05-29 NOTE — Progress Notes (Signed)
Remote pacemaker transmission.   

## 2023-05-30 ENCOUNTER — Ambulatory Visit: Payer: Medicare Other | Attending: Internal Medicine | Admitting: Cardiovascular Disease

## 2023-05-30 ENCOUNTER — Encounter: Payer: Self-pay | Admitting: Cardiovascular Disease

## 2023-05-30 VITALS — BP 124/81 | HR 76 | Ht 69.0 in | Wt 211.0 lb

## 2023-05-30 DIAGNOSIS — Z950 Presence of cardiac pacemaker: Secondary | ICD-10-CM | POA: Diagnosis not present

## 2023-05-30 DIAGNOSIS — E1159 Type 2 diabetes mellitus with other circulatory complications: Secondary | ICD-10-CM

## 2023-05-30 DIAGNOSIS — R0989 Other specified symptoms and signs involving the circulatory and respiratory systems: Secondary | ICD-10-CM

## 2023-05-30 DIAGNOSIS — I25708 Atherosclerosis of coronary artery bypass graft(s), unspecified, with other forms of angina pectoris: Secondary | ICD-10-CM | POA: Diagnosis not present

## 2023-05-30 DIAGNOSIS — E785 Hyperlipidemia, unspecified: Secondary | ICD-10-CM

## 2023-05-30 DIAGNOSIS — I480 Paroxysmal atrial fibrillation: Secondary | ICD-10-CM

## 2023-05-30 DIAGNOSIS — N200 Calculus of kidney: Secondary | ICD-10-CM

## 2023-05-30 DIAGNOSIS — Z79899 Other long term (current) drug therapy: Secondary | ICD-10-CM

## 2023-05-30 DIAGNOSIS — R55 Syncope and collapse: Secondary | ICD-10-CM

## 2023-05-30 DIAGNOSIS — Z7984 Long term (current) use of oral hypoglycemic drugs: Secondary | ICD-10-CM

## 2023-05-30 DIAGNOSIS — D6869 Other thrombophilia: Secondary | ICD-10-CM

## 2023-05-30 NOTE — Progress Notes (Signed)
Cardiology Office Note   Date:  05/30/2023   ID:  Lucas Porter, DOB November 21, 1955, MRN [id number]  PCP:  Porfirio Oar, PA  Cardiologist:  Thurmon Fair, MD  Electrophysiologist:  None   Evaluation Performed:  Follow-Up Visit  Chief Complaint:  Chest pain  History of Present Illness:    Lucas Porter is a 66 y.o. 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  He has developed some problems with shuffling gait and stumbling and there is concerned that he may have Parkinson's disease.  There is a plan for brain MRI, but it was requested that his pacemaker be checked before that.  He does have an MRI conditional system (Medtronic Azure XT DR generator, both leads are Medtronic [phone]).  He is not pacemaker dependent.  Device function checked today is normal.  Estimated generator longevity is 9.5 years.  Lead parameters are excellent.  He has 46% ventricular pacing.  Overall he has excellent ventricular rate control although he has occasional bursts of high ventricular rates which all appear to represent conducted atrial fibrillation.  The patient specifically denies any chest pain at rest exertion, dyspnea at rest or with exertion, orthopnea, paroxysmal nocturnal dyspnea, syncope, palpitations, focal neurological deficits, intermittent claudication, lower extremity edema, unexplained weight gain, cough, hemoptysis or wheezing.  Recently he passed the kidney stone and has a little bit of residual hematuria that is clearing up.  In  2023 Dr. Laverle Patter had to perform ES [facility] for another kidney stone.  Other than that he has not had any bleeding problems.  He is on chronic anticoagulation with warfarin.  For his angina he is on moderate dose long-acting nitrates in addition to high-dose beta-blocker and moderate dose diltiazem.  He did fine after he stopped Ranexa.  He has not required nitroglycerin in a long time while taking this regimen.  He is not sure whether he still taking clopidogrel.  He is on chronic statin therapy.  2 years ago in May 2021 (6 months after the last PCI) he had a cardiac catheterization that showed no targets for revascularization (patent, mild in-stent restenosis in the left main-left circumflex stent, patent stent in the OM branch of the circumflex, patent stent in the SVG to diagonal, patent SVG to ramus intermedius and LIMA to LAD, known chronic total occlusion of both the native right coronary artery and the SVG to RCA).  Medical therapy including long-term treatment with clopidogrel was recommended.  His nuclear stress test on 01/10/2021 did not show any major reversible abnormalities, although a small fixed apical infarction was seen.  Labs performed in mid May show hemoglobin A1c of 6.4%, total hemoglobin 12.9, creatinine 1.47, potassium 4.0.  His most recent lipid profile showed an LDL cholesterol of 50 and HDL of 37.  His BMI is just under 30.   Past Medical History:  Diagnosis Date   Anxiety    Atrial fibrillation (HCC)    paroxysmal   Atrial flutter (HCC)    Coagulopathy (HCC)    persistent elevation of ACT during 10/17/11 hospitalization   Coronary artery disease    s/p CABG 2010, s/p Successful PCI  10/17/11 of Distal Left Main and  the Left Circumflex with a Promus Element DES stent -- 4.0 mm x 16 mm postdilated to 4.12 mm    DDD (degenerative disc disease)    Depression    Dysrhythmia    afib   Facial asymmetry 09/17/2008   pt. stated it was very temporary, never seen by doctor for it.     GERD (gastroesophageal reflux disease)    Heart murmur    Hyperlipidemia    Hypertension    Kidney stones    Presence of permanent cardiac pacemaker    Medtronic    S/P coronary artery stent placement to distal Lt. main and Lt. Cx 10/17/2011   Tachycardia-bradycardia syndrome (HCC) 10/18/2010   s/p PPM (MDT) by Dr Fredrich Birks   Past Surgical History:  Procedure Laterality Date   ATRIAL FIBRILLATION ABLATION  04/24/12, 12/02/12   PVI x2 and CTI ablation by Dr Johney Frame   ATRIAL FIBRILLATION ABLATION N/A 04/24/2012   Procedure: ATRIAL FIBRILLATION ABLATION;  Surgeon: Hillis Range, MD;  Location: [facility] CATH LAB;  Service: Cardiovascular;  Laterality: N/A;   ATRIAL FIBRILLATION ABLATION N/A 12/02/2012   Procedure: ATRIAL FIBRILLATION ABLATION;  Surgeon: Hillis Range, MD;  Location: [facility] CATH LAB;  Service: Cardiovascular;  Laterality: N/A;   COLONOSCOPY WITH PROPOFOL N/A 02/26/2022   Procedure: COLONOSCOPY WITH PROPOFOL;  Surgeon: Benancio Deeds, MD;  Location: [facility] ENDOSCOPY;  Service: Gastroenterology;  Laterality: N/A;   CORONARY ANGIOPLASTY  10/19/11   CORONARY ARTERY BYPASS GRAFT  2010   CORONARY STENT INTERVENTION N/A 04/13/2019   Procedure: CORONARY STENT INTERVENTION;  Surgeon: Yvonne Kendall, MD;  Location: [facility] INVASIVE CV LAB;  Service: Cardiovascular;  Laterality: N/A;   CORONARY STENT INTERVENTION N/A 07/28/2019   Procedure: CORONARY STENT INTERVENTION;  Surgeon: Corky Crafts, MD;  Location: [facility] INVASIVE CV LAB;  Service: Cardiovascular;  Laterality: N/A;   CYSTOSCOPY  2007 ?   treatment for Kidney stones    EXTRACORPOREAL SHOCK WAVE LITHOTRIPSY Left 04/16/2022   Procedure: EXTRACORPOREAL SHOCK WAVE LITHOTRIPSY (ESWL);  Surgeon: Heloise Purpura, MD;  Location: [facility];  Service: Urology;  Laterality: Left;  NEEDS 90 MINS   INGUINAL HERNIA REPAIR Left 01/04/2016   Procedure: LAPAROSCOPIC LEFT INGUINAL HERNIA WITH MESH;  Surgeon: Axel Filler, MD;  Location: [facility] OR;   Service: General;  Laterality: Left;   INSERT / REPLACE / REMOVE PACEMAKER  2/12   Medtronic   INSERTION OF MESH Left 01/04/2016   Procedure: INSERTION OF MESH;  Surgeon: Axel Filler, MD;  Location: [facility] OR;  Service: General;  Laterality: Left;   LAPAROSCOPIC LYSIS OF ADHESIONS Left 01/04/2016   Procedure: LAPAROSCOPIC LYSIS OF ADHESIONS;  Surgeon: Axel Filler, MD;  Location: [facility] OR;  Service: General;  Laterality: Left;   LEFT HEART CATH AND CORS/GRAFTS ANGIOGRAPHY N/A 02/20/2019   Procedure: LEFT HEART CATH AND CORS/GRAFTS ANGIOGRAPHY;  Surgeon: Lyn Records, MD;  [facility] INVAS[facility] CV LAB;  Service: Cardiovascular;  Laterality: N/A;   LEFT HEART CATH AND CORS/GRAFTS ANGIOGRAPHY N/A 04/13/2019   Procedure: LEFT HEART CATH AND CORS/GRAFTS ANGIOGRAPHY;  Surgeon: Yvonne Kendall, MD;  [facility] LAB;  Service: Cardiovascular;  Laterality: N/A;   LEFT HEART CATH AND CORS/GRAFTS ANGIOGRAPHY N/A 07/28/2019   Procedure: LEFT HEART CATH AND CORS/GRAFTS ANGIOGRAPHY;  Surgeon: Corky Crafts, MD;  [facility] LAB;  Service: Cardiovascular;  Laterality: N/A;   LEFT HEART CATH AND CORS/GRAFTS ANGIOGRAPHY N/A 02/09/2020   Procedure: LEFT HEART CATH AND CORS/GRAFTS ANGIOGRAPHY;  Surgeon: Tonny Bollman,  MD;  [facility] CV LAB;  Service: Cardiovascular;  Laterality: N/A;   LEFT HEART CATHETERIZATION WITH CORONARY/GRAFT ANGIOGRAM N/A 10/17/2011   Procedure: LEFT HEART CATHETERIZATION WITH Isabel Caprice;  Surgeon: Thurmon Fair, MD;  Location: [facility] CATH LAB;  Service: Cardiovascular;  Laterality: N/A;   LEFT HEART CATHETERIZATION WITH CORONARY/GRAFT ANGIOGRAM N/A 02/08/2012   Procedure: LEFT HEART CATHETERIZATION WITH Isabel Caprice;  Surgeon: Thurmon Fair, MD;  Location: [facility] CATH LAB;  Service: Cardiovascular;  Laterality: N/A;   NM MYOCAR PERF WALL MOTION  03/23/2011   Low risk   PERCUTANEOUS CORONARY STENT INTERVENTION (PCI-S)  10/17/2011    Procedure: PERCUTANEOUS CORONARY STENT INTERVENTION (PCI-S);  Surgeon: Thurmon Fair, MD;  Location: [facility] CATH LAB;  Service: Cardiovascular;;   POLYPECTOMY  02/26/2022   Procedure: POLYPECTOMY;  Surgeon: Benancio Deeds, MD;  Location: [facility] ENDOS[facility];  Service: Gastroenterology;;   Gerilyn Pilgrim GENERATOR CHANGEOUT N/A 02/20/2019   Procedure: PPM GENERATOR CHANGEOUT;  Surgeon: Thurmon Fair, MD;  [facility]VAS[facility] CV LAB;  Service: Cardiovascular;  Laterality: N/A;   TEE WITHOUT CARDIOVERSION  04/23/2012   Procedure: TRANSESOPHAGEAL ECHOCARDIOGRAM (TEE);  Surgeon: Wendall Stade, MD;  Location: [facility] ENDOSCOPY;  Service: Cardiovascular;  Laterality: N/A;  spoke to pt about time change from 11a to 12p/DL   TEE WITHOUT CARDIOVERSION N/A 12/01/2012   Procedure: TRANSESOPHAGEAL ECHOCARDIOGRAM (TEE);  Surgeon: Thurmon Fair, MD;  Location: [facility] ENDOSCOPY;  Service: Cardiovascular;  Laterality: N/A;     Current Meds  Medication Sig   acetaminophen (TYLENOL) 500 MG tablet Take 1,000 mg by mouth every 6 (six) hours as needed for moderate pain.   clonazePAM (KLONOPIN) 0.5 MG tablet Take 1 tablet (0.5 mg total) by mouth 2 (two) times daily as needed for anxiety.   cyanocobalamin (VITAMIN B12) 1000 MCG tablet Take 1,000 mcg by mouth daily.   diltiazem (DILT-XR) 180 MG 24 hr capsule Take 1 capsule (180 mg total) by mouth daily.   isosorbide mononitrate (IMDUR) 60 MG 24 hr tablet TAKE 1 TABLET BY MOUTH  TWICE DAILY   lisinopril (ZESTRIL) 20 MG tablet Take 1 tablet (20 mg total) by mouth daily.   MELATONIN PO Take 1 tablet by mouth at bedtime as needed (sleep).   metFORMIN (GLUCOPHAGE) 500 MG tablet Take 2 tablets by mouth 2 (two) times daily with a meal.   metoprolol tartrate (LOPRESSOR) 100 MG tablet Take 100 mg by mouth 2 (two) times daily.   pantoprazole (PROTONIX) 40 MG tablet TAKE 1 TABLET BY MOUTH DAILY   potassium chloride (KLOR-CON) 10 MEQ tablet TAKE 1 TABLET BY MOUTH DAILY   rosuvastatin (CRESTOR) 40 MG  tablet TAKE 1 TABLET BY MOUTH ONCE DAILY   venlafaxine XR (EFFEXOR-XR) 75 MG 24 hr capsule TAKE ONE CAPSULE BY MOUTH ONCE DAILY TAKE  WITH  150MG   TO  EQUAL  A  TOTAL  OF  225MG   DAILY (Patient taking differently: Take 75 mg by mouth at bedtime.)   warfarin (COUMADIN) 5 MG tablet TAKE 1/2 TO 1 TABLET BY MOUTH  DAILY AS DIRECTED BY COUMADIN  CLINIC     Allergies:   Patient has no known allergies.   Social History   Tobacco Use   Smoking status: Never   Smokeless tobacco: Former    Types: Chew    Quit date: 10/03/2012   Tobacco comments:    quit smoking in 1970's  Vaping Use   Vaping status: Never Used  Substance Use Topics   Alcohol use: Not Currently  Comment: rare   Drug use: Yes    Types: Marijuana    Comment: daily     Family Hx: The patient's family history includes Alzheimer's disease in his father; Colon cancer in his mother; Coronary artery disease in an other family member; Diabetes in his brother; Heart attack in his brother; Heart disease in his brother and father; Hypertension in his brother, brother, and sister; Stroke in his brother. There is no history of Stomach cancer or Pancreatic cancer.  ROS:   Please see the history of present illness.    All other systems reviewed and are negative.   Prior CV studies:   The following studies were reviewed today: Nuclear stress test 01/10/2021  EKG not interpretable due to ventricular paced rhythm The left ventricular ejection fraction is mildly decreased (45-54%). Nuclear stress EF: 53%. Defect 1: There is a medium defect of mild severity present in the basal inferior, mid inferior and apical inferior location. Defect 2: There is a small defect of mild severity present in the apex location. Findings consistent with prior myocardial infarction. This is a low risk study.   1. Fixed perfusion defect at apex with hypokinesis consistent with infarct 2. Fixed inferior perfusion defect with normal wall motion suggests  artifact 3. Low risk study.  No ischemia.   Coronary angiography July 28, 2019  Dist LM to [facility] LAD lesion is 100% stenosed. Ost LM to Dist LM lesion is 10% stenosed. Ost 1st Diag lesion is 100% stenosed. Ost 2nd Mrg lesion is 90% stenosed. Non-stenotic Dist Cx lesion was previously treated. Previously placed 3rd Mrg drug eluting stent is widely patent. Previously placed Ost Cx to Prox Cx drug eluting stent is widely patent. Balloon angioplasty was performed. Ost Ramus lesion is 100% stenosed. LIMA and is large. SVG. Prox Graft lesion is 40% stenosed. SVG and is normal in caliber. Non-stenotic Prox Graft lesion was previously treated. Ost RCA to Prox RCA lesion is 100% stenosed. Origin lesion is 100% stenosed.   1. Severe native vessel CAD with mild in-stent restenosis of the left main/left circumflex stent, continued patency of the stented segment in the OM branch of the circumflex 2. Chronic RCA occlusion with chronic occlusion of the SVG-RCA 3. Continued patency of the SVG-ramus intermedius, SVG-diagonal, and LIMA-LAD 4. Normal LVEDP   Recommend: continued medical therapy. Ok to resume warfarin tonight. Continue clopidogrel long term   Labs/Other Tests and Data Reviewed:    EKG: ECG is not performed today.  Shows atrial fibrillation with mostly ventricular sensed beats, controlled ventricular rate, occasional ventricular pacing.  There is mild lateral ST segment depression T wave inversion unchanged from previous tracings.  Recent Labs: No results found for requested labs within last 365 days.   Recent Lipid Panel Lab Results  Component Value Date/Time   CHOL 132 01/10/2021 11:18 AM   TRIG 290 (H) 01/10/2021 11:18 AM   HDL 37 (L) 01/10/2021 11:18 AM   CHOLHDL 3.6 01/10/2021 11:18 AM   CHOLHDL 6.6 02/10/2020 06:21 AM   LDLCALC 50 01/10/2021 11:18 AM   10/23/2022 Cholesterol 152, triglycerides 275, HDL 42, LDL 66  Wt Readings from Last 3 Encounters:  05/30/23  211 lb (95.7 kg)  04/16/22 201 lb (91.2 kg)  03/12/22 204 lb 9.6 oz (92.8 kg)     Objective:    Vital Signs:  BP 124/81   Pulse 76   Ht 5\' 9"  (1.753 m)   Wt 211 lb (95.7 kg)   SpO2 97%   BMI  31.16 kg/m     General: Alert, oriented x3, no distress, healthy subclavian pacemaker site Head: no evidence of trauma, PERRL, EOMI, no exophtalmos or lid lag, no myxedema, no xanthelasma; normal ears, nose and oropharynx Neck: normal jugular venous pulsations and no hepatojugular reflux; brisk carotid pulses without delay, but he has a new left carotid bruit that I do not think that occurred before. Chest: clear to auscultation, no signs of consolidation by percussion or palpation, normal fremitus, symmetrical and full respiratory excursions Cardiovascular: normal position and quality of the apical impulse, irregular rhythm, normal first and second heart sounds, no murmurs, rubs or gallops Abdomen: no tenderness or distention, no masses by palpation, no abnormal pulsatility or arterial bruits, normal bowel sounds, no hepatosplenomegaly Extremities: no clubbing, cyanosis or edema; 2+ radial, ulnar and brachial pulses bilaterally; 2+ right femoral, posterior tibial and dorsalis pedis pulses; 2+ left femoral, posterior tibial and dorsalis pedis pulses; no subclavian or femoral bruits Neurological: grossly nonfocal Psych: Normal mood and affect   ASSESSMENT & PLAN:    1. Coronary artery disease of bypass graft of native heart with stable angina pectoris (HCC)   2. Paroxysmal atrial fibrillation (HCC)   3. Left carotid bruit   4. Pacemaker   5. Acquired thrombophilia (HCC)   6. Neurocardiogenic syncope   7. Hyperlipidemia LDL goal <70   8. Type 2 diabetes mellitus with other circulatory complication, without long-term current use of insulin (HCC)   9. Nephrolithiasis   10. Medication management       CAD s/p CABG and PCI x4 w recurrent angina: Currently is angina is very well-controlled on  on beta-blocker, calcium channel blocker, isosorbide all in relatively high doses (we decreased his diltiazem dose since he had excessive ventricular pacing).  Stopping the Ranexa did not make a difference.  Nuclear stress test did not show reversible ischemia.  He has not had a revascularization procedure in over 3 years and can safely stop his antiplatelet agents, especially since he is at increased bleeding risk while on warfarin. AFib: Permanent arrhythmia.  Frequent brief bursts of RVR seen, but on average has excellent ventricular rate control. Left carotid bruit: New finding.  On duplex ultrasound from 2021 he had a right-sided 40-59% stenosis, but the left side was normal.  It is time for a follow-up study.  Recheck duplex ultrasound.  Asymptomatic (his neurological complaints do not sound vascular in pattern) PM: Has an MRI conditional device.  Okay to go ahead with MRI of the brain, with appropriate reprogramming precautions.  Normal device function.  Continue remote downloads every 3 months.  He is not pacemaker dependent.  His pacemaker was initially implanted for tachycardia-bradycardia syndrome and he now has permanent atrial fibrillation.  The device has proven to be excellent treatment for his previous frequent episodes of neurally mediated syncope.  Continue downloads every 3 months Anticoagulation: Moderate embolic risk. CHADSVasc 4 (HTN, DM, CAD, age).  Tolerating warfarin plus clopidogrel without bleeding complications outside of his problems with nephrolithiasis.  I think we can stop the clopidogrel if he is still taking it. Neurally mediated syncope: She has not had any new events since pacemaker implantation (he has not had any episodes of syncope or even near syncope).  Seems to be tolerating vasodilator therapy okay. HLP: Has gained back weight.  I worry that his triglycerides will be increasing in his HDL will be dropping again.  Recheck labs.  LDL was within target range less than  70. DM: Most recent hemoglobin  A1c was good at 6.4%, last checked in February 2024 Symptomatic nephrolithiasis: Recently passed a kidney stone and had some hematuria that is already clearing up by itself. Possible parkinsonism, plan for MRI with appropriate pacemaker precautions.  He was extremely claustrophobic with a previous attempt at MRI.  Needs to have an open MRI.   Risk Assessment/Calculations:     CHA2DS2-VASc Score = 4   This indicates a 4.8% annual risk of stroke. The patient's score is based upon: CHF History: 0 HTN History: 1 Diabetes History: 1 Stroke History: 0 Vascular Disease History: 1 Age Score: 1 Gender Score: 0     CHF History: 0 HTN History: 1 Diabetes History: 1 Stroke History: 0 Vascular Disease History: 1 Age Score: 1 Gender Score: 0      Medication Adjustments/Labs and Tests Ordered: Current medicines are reviewed at length with the patient today.  Concerns regarding medicines are outlined above.  Orders Placed This Encounter  Procedures   Lipid panel   Hemoglobin A1c   Comprehensive metabolic panel   EKG 12-Lead   VAS US CAROTID   No orders of the defined types were placed in this encounter.   Patient Instructions  Medication Instructions:  No changes *If you need a refill on your cardiac medications before your next appointment, please call your pharmacy*  Lab Work: Lipid, HgA1C, CMP- today If you have labs (blood work) drawn today and your tests are completely normal, you will receive your results only by: MyChart Message (if you have MyChart) OR A paper copy in the mail If you have any lab test that is abnormal or we need to change your treatment, we will call you to review the results.  Procedures: Your physician has requested that you have a carotid duplex. This test is an ultrasound of the carotid arteries in your neck. It looks at blood flow through these arteries that supply the brain with blood. Allow one hour for this  exam. There are no restrictions or special instructions.   Follow-Up: At [facility], you and your health needs are our priority.  As part of our continuing mission to provide you with exceptional heart care, we have created designated Provider Care Teams.  These Care Teams include your primary Cardiologist (physician) and Advanced Practice Providers (APPs -  Physician Assistants and Nurse Practitioners) who all work together to provide you with the care you need, when you need it.  We recommend signing up for the patient portal called "MyChart".  Sign up information is provided on this After Visit Summary.  MyChart is used to connect with patients for Virtual Visits (Telemedicine).  Patients are able to view lab/test results, encounter notes, upcoming appointments, etc.  Non-urgent messages can be sent to your provider as well.   To learn more about what you can do with MyChart, go to [email or web address].    Your next appointment:   1 year(s)  Provider:   Thurmon Fair, MD       Signed, Thurmon Fair, MD  05/30/2023 5:20 PM    [facility]

## 2023-05-30 NOTE — Progress Notes (Signed)
Pt reported/asked me to let Dr Royann Shivers know that he passed a kidney stone a few weeks ago. (Informed Dr Royann Shivers) The patient reports having a little blood in urine and discomfort when kidney stone passed, but reports no problems since passing the stone. Instructed the patient to let his PCP and Nephrologist and/or Urologist know about this. His spouse reports having an appointment soon with the PCP and inform providers of this information.

## 2023-05-30 NOTE — Patient Instructions (Addendum)
Medication Instructions:  No changes *If you need a refill on your cardiac medications before your next appointment, please call your pharmacy*  Lab Work: Lipid, HgA1C, CMP- today If you have labs (blood work) drawn today and your tests are completely normal, you will receive your results only by: MyChart Message (if you have MyChart) OR A paper copy in the mail If you have any lab test that is abnormal or we need to change your treatment, we will call you to review the results.  Procedures: Your physician has requested that you have a carotid duplex. This test is an ultrasound of the carotid arteries in your neck. It looks at blood flow through these arteries that supply the brain with blood. Allow one hour for this exam. There are no restrictions or special instructions.   Follow-Up: At [facility], you and your health needs are our priority.  As part of our continuing mission to provide you with exceptional heart care, we have created designated Provider Care Teams.  These Care Teams include your primary Cardiologist (physician) and Advanced Practice Providers (APPs -  Physician Assistants and Nurse Practitioners) who all work together to provide you with the care you need, when you need it.  We recommend signing up for the patient portal called "MyChart".  Sign up information is provided on this After Visit Summary.  MyChart is used to connect with patients for Virtual Visits (Telemedicine).  Patients are able to view lab/test results, encounter notes, upcoming appointments, etc.  Non-urgent messages can be sent to your provider as well.   To learn more about what you can do with MyChart, go to [email or web address].    Your next appointment:   1 year(s)  Provider:   Thurmon Fair, MD

## 2023-05-31 LAB — LIPID PANEL
Chol/HDL Ratio: 3.4 ratio (ref 0.0–5.0)
Cholesterol, Total: 132 mg/dL (ref 100–199)
HDL: 39 mg/dL — ABNORMAL LOW (ref >39)
LDL Chol Calc (NIH): 50 mg/dL (ref 0–99)
Triglycerides: 274 mg/dL — ABNORMAL HIGH (ref 0–149)
VLDL Cholesterol Cal: 43 mg/dL — ABNORMAL HIGH (ref 5–40)

## 2023-05-31 LAB — HEMOGLOBIN A1C
Est. average glucose Bld gHb Est-mCnc: 140 mg/dL
Hgb A1c MFr Bld: 6.5 % — ABNORMAL HIGH (ref 4.8–5.6)

## 2023-05-31 LAB — COMPREHENSIVE METABOLIC PANEL
ALT: 10 IU/L (ref 0–44)
AST: 13 IU/L (ref 0–40)
Albumin: 4.7 g/dL (ref 3.9–4.9)
Alkaline Phosphatase: 81 IU/L (ref 44–121)
BUN/Creatinine Ratio: 14 (ref 10–24)
BUN: 13 mg/dL (ref 8–27)
Bilirubin Total: 0.4 mg/dL (ref 0.0–1.2)
CO2: 22 mmol/L (ref 20–29)
Calcium: 9.8 mg/dL (ref 8.6–10.2)
Chloride: 103 mmol/L (ref 96–106)
Creatinine, Ser: 0.96 mg/dL (ref 0.76–1.27)
Globulin, Total: 2.3 g/dL (ref 1.5–4.5)
Glucose: 120 mg/dL — ABNORMAL HIGH (ref 70–99)
Potassium: 4.5 mmol/L (ref 3.5–5.2)
Sodium: 143 mmol/L (ref 134–144)
Total Protein: 7 g/dL (ref 6.0–8.5)
eGFR: 87 mL/min/{1.73_m2} (ref >59)

## 2023-06-03 ENCOUNTER — Ambulatory Visit (HOSPITAL_COMMUNITY)
Admission: RE | Admit: 2023-06-03 | Discharge: 2023-06-03 | Disposition: A | Discharge status: Home / Self Care | Payer: Medicare Other | Source: Ambulatory Visit | Attending: Cardiology | Admitting: Cardiology

## 2023-06-03 DIAGNOSIS — R0989 Other specified symptoms and signs involving the circulatory and respiratory systems: Secondary | ICD-10-CM | POA: Insufficient documentation

## 2023-06-04 ENCOUNTER — Ambulatory Visit: Payer: Medicare Other | Attending: Cardiology

## 2023-06-04 ENCOUNTER — Telehealth: Payer: Self-pay | Admitting: Emergency Medicine

## 2023-06-04 DIAGNOSIS — Z7901 Long term (current) use of anticoagulants: Secondary | ICD-10-CM | POA: Diagnosis not present

## 2023-06-04 DIAGNOSIS — I480 Paroxysmal atrial fibrillation: Secondary | ICD-10-CM

## 2023-06-04 DIAGNOSIS — I708 Atherosclerosis of other arteries: Secondary | ICD-10-CM

## 2023-06-04 LAB — POCT INR: INR: 3.3 — AB (ref 2.0–3.0)

## 2023-06-04 NOTE — Telephone Encounter (Signed)
There is no significant narrowing of either carotid artery.  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  This is located upstream of 2 important blood vessels: The vertebral artery that goes to his brain and the left internal mammary artery that is one of the bypasses to his heart.  As long as he is not having symptoms (chest pain with activity, muscle pain when using the left arm, dizziness or loss of consciousness when using the left arm), this blockage does not need to be immediately treated, but I would like [place] to keep a close eye on it.  At some point we may need to stent that subclavian artery.  Please make him an appointment with Dr. Nanetta Batty.  (Left message and call back number) Called the patient to go over the results above and to let him know that Dr Royann Shivers would like him to be seen by Dr Nanetta Batty.

## 2023-06-04 NOTE — Patient Instructions (Signed)
Continue taking 2.5mg  daily (0.5 tablet). Repeat INR in 4 weeks. Call [phone] if any questions; Have surgical center fax to cardiology office. [facility] office;  [employer]at [employer].

## 2023-06-11 ENCOUNTER — Telehealth: Payer: Self-pay | Admitting: Cardiovascular Disease

## 2023-06-11 NOTE — Telephone Encounter (Signed)
New Message:     Wife would like for Dr Renaye Rakers nurse to please call. It is concerning, his MRI.

## 2023-06-11 NOTE — Telephone Encounter (Signed)
Spoke to patient's wife she stated radiologist Dr.Michael Verenes at [facility] needs a letter from Dr.Croitoru stating ok for husband to have upcoming mri of head.Dr.Croitoru's 05/30/23 office note stating ok to have mri faxed to fax # [phone].

## 2023-06-13 NOTE — Telephone Encounter (Signed)
Left message stating that the office visit notes were faxed to the Radiologist that they requested it be sent to. Left call back number- if further information is needed.

## 2023-06-13 NOTE — Telephone Encounter (Signed)
Outreach  made to Pt's wife.  Advised MRI clearance completed and faxed on 06/10/2023.  Advised if any further needs to call device clinic.  Wife thanked Engineer, civil (consulting) for assistance.

## 2023-06-13 NOTE — Telephone Encounter (Signed)
I will fax office visit notes to the fax number given.

## 2023-06-19 ENCOUNTER — Other Ambulatory Visit: Payer: Self-pay | Admitting: Cardiovascular Disease

## 2023-06-24 ENCOUNTER — Encounter: Payer: Self-pay | Admitting: Cardiovascular Disease

## 2023-06-24 ENCOUNTER — Ambulatory Visit: Payer: Medicare Other | Attending: Cardiovascular Disease | Admitting: Cardiovascular Disease

## 2023-06-24 VITALS — BP 146/94 | HR 60 | Ht 69.0 in | Wt 209.8 lb

## 2023-06-24 DIAGNOSIS — I771 Stricture of artery: Secondary | ICD-10-CM | POA: Diagnosis not present

## 2023-06-24 NOTE — Progress Notes (Signed)
06/24/2023 Lucas Porter   1955-11-08  [id number]  Primary Physician Porfirio Oar, PA Primary Cardiologist: Runell Gess MD Milagros Loll, [place], [place]  HPI:  Lucas Porter is a 67 y.o. moderately overweight married Caucasian male father of 2, grandfather 5 grandchildren he is accompanied by his wife Lucas Porter today.  He was referred by Dr. Royann Shivers for evaluation of left subclavian artery stenosis.  He has a history of coronary disease status post bypass grafting in 2010 with multiple stent procedures since.  His last catheterization performed Dr. Excell Seltzer 02/09/2020 was notable for a patent LIMA to the LAD which self had ostial occlusion, patent vein to a ramus branch and an occluded vein to an RCA which with occluded as well.  His other problems include treated hypertension, diabetes and hyperlipidemia.  He has had a pacemaker put in in the past.  He is fairly unsteady on his feet and has a diagnosis of Parkinson's disease.  He really denies left upper extremity claudication or angina.  He had Doppler studies performed in our office 06/03/2023 revealing no evidence of carotid disease with probably moderate left subclavian artery stenosis and bidirectional left vertebral flow.   Current Meds  Medication Sig   acetaminophen (TYLENOL) 500 MG tablet Take 1,000 mg by mouth every 6 (six) hours as needed for moderate pain.   clonazePAM (KLONOPIN) 0.5 MG tablet Take 1 tablet (0.5 mg total) by mouth 2 (two) times daily as needed for anxiety.   cyanocobalamin (VITAMIN B12) 1000 MCG tablet Take 1,000 mcg by mouth daily.   dextromethorphan-guaiFENesin (MUCINEX DM) 30-600 MG 12hr tablet Take 1 tablet by mouth 2 (two) times daily as needed (congestion).   diazepam (VALIUM) 5 MG tablet Take 5 mg by mouth.   diltiazem (DILT-XR) 180 MG 24 hr capsule Take 1 capsule (180 mg total) by mouth daily.   HYDROcodone-acetaminophen (NORCO/VICODIN) 5-325 MG tablet Take 1-2 tablets by mouth every 6 (six) hours as  needed.   isosorbide mononitrate (IMDUR) 60 MG 24 hr tablet TAKE 1 TABLET BY MOUTH TWICE  DAILY   lisinopril (ZESTRIL) 20 MG tablet Take 1 tablet (20 mg total) by mouth daily.   MELATONIN PO Take 1 tablet by mouth at bedtime as needed (sleep).   metFORMIN (GLUCOPHAGE) 500 MG tablet Take 2 tablets by mouth 2 (two) times daily with a meal.   metoprolol tartrate (LOPRESSOR) 100 MG tablet Take 100 mg by mouth 2 (two) times daily.   nitroGLYCERIN (NITROSTAT) 0.4 MG SL tablet Take one tablet as needed for chest pain, can take up to three times 5 min apart after 3 tablets and no relief call 911.   pantoprazole (PROTONIX) 40 MG tablet TAKE 1 TABLET BY MOUTH DAILY   potassium chloride (KLOR-CON) 10 MEQ tablet TAKE 1 TABLET BY MOUTH DAILY   rosuvastatin (CRESTOR) 40 MG tablet TAKE 1 TABLET BY MOUTH ONCE DAILY   venlafaxine XR (EFFEXOR-XR) 75 MG 24 hr capsule TAKE ONE CAPSULE BY MOUTH ONCE DAILY TAKE  WITH  150MG   TO  EQUAL  A  TOTAL  OF  225MG   DAILY (Patient taking differently: Take 75 mg by mouth at bedtime.)   warfarin (COUMADIN) 5 MG tablet TAKE 1/2 TO 1 TABLET BY MOUTH  DAILY AS DIRECTED BY COUMADIN  CLINIC     No Known Allergies  Social History   Socioeconomic History   Marital status: Married    Spouse name: Lucas Porter   Number of children: 2   Years of  education: 10   Highest education level: 10th grade  Occupational History   Occupation: disability-heart disease    Comment: fence installation  Tobacco Use   Smoking status: Never   Smokeless tobacco: Former    Types: Chew    Quit date: 10/03/2012   Tobacco comments:    quit smoking in 1970's  Vaping Use   Vaping status: Never Used  Substance and Sexual Activity   Alcohol use: Not Currently    Comment: rare   Drug use: Yes    Types: Marijuana    Comment: daily   Sexual activity: Yes  Other Topics Concern   Not on file  Social History Narrative   Pt lives in [place] with spouse.     2 grown daughters.     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   Caffeine intake daily    Social Determinants of Health   Financial Resource Strain: Low Risk  (02/18/2023)   Received from [facility], [facility]   Overall Financial Resource Strain (CARDIA)    Difficulty of Paying Living Expenses: Not very hard  Food Insecurity: No Food Insecurity (02/18/2023)   Received from [facility], [facility]   Hunger Vital Sign    Worried About Running Out of Food in the Last Year: Never true    Ran Out of Food in the Last Year: Never true  Transportation Needs: No Transportation Needs (02/18/2023)   Received from [facility], [facility]   PRAPARE - Transportation    Lack of Transportation (Medical): No    Lack of Transportation (Non-Medical): No  Physical Activity: Unknown (02/18/2023)   Received from [facility], [facility]   Exercise Vital Sign    Days of Exercise per Week: 0 days    Minutes of Exercise per Session: Not on file  Stress: Stress Concern Present (02/18/2023)   Received from [facility], [facility] - Occupational Stress Questionnaire    Feeling of Stress : Rather much  Social Connections: Socially Isolated (02/18/2023)   Received from [facility], [facility]   Social Network    How would you rate your social network (family, work, friends)?: Little participation, lonely and socially isolated  Intimate Partner Violence: Not At Risk (02/18/2023)   Received from [facility], [facility]   HITS    Over the last 12 months how often did your partner physically hurt you?: 1    Over the last 12 months how often did your partner insult you or talk down to you?: 1    Over the last 12 months how often did your partner threaten you with physical harm?: 1    Over the last 12 months how often did your partner scream or curse at you?: 1     Review of Systems: General: negative for chills, fever, night sweats or weight changes.   Cardiovascular: negative for chest pain, dyspnea on exertion, edema, orthopnea, palpitations, paroxysmal nocturnal dyspnea or shortness of breath Dermatological: negative for rash Respiratory: negative for cough or wheezing Urologic: negative for hematuria Abdominal: negative for nausea, vomiting, diarrhea, bright red blood per rectum, melena, or hematemesis Neurologic: negative for visual changes, syncope, or dizziness All other systems reviewed and are otherwise negative except as noted above.    Blood pressure (!) 146/94, pulse 60, height 5\' 9"  (1.753 m), weight 209 lb 12.8 oz (95.2 kg), SpO2 96%.  General appearance: alert and no distress Neck: no adenopathy,  no JVD, supple, symmetrical, trachea midline, thyroid not enlarged, symmetric, no tenderness/mass/nodules, and left carotid/subclavian bruit Lungs: clear to auscultation bilaterally Heart: regular rate and rhythm, S1, S2 normal, no murmur, click, rub or gallop Extremities: extremities normal, atraumatic, no cyanosis or edema Pulses: 2+ and symmetric Skin: Skin color, texture, turgor normal. No rashes or lesions Neurologic: Grossly normal  EKG not performed today      ASSESSMENT AND PLAN:   Subclavian artery stenosis, left [facility]) Mr. Isham was referred to me by Dr. Royann Shivers for evaluation of left subclavian artery stenosis.  He had bypass surgery in 2010 and multiple PCI's since that time.  His most recent cardiac catheterization performed by Dr. Excell Seltzer 02/09/2020 revealed patent vein to ramus branch, patent LIMA to the LAD which was itself occluded and total native RCA and vein to the RCA.  Patient had Doppler studies performed 06/03/2023 revealing probably moderate left subclavian artery stenosis with by directional vertebral flow.  He really denies left upper extremity claudication or significant angina.  At this point, I recommend conservative treatment with left subclavian noninvasive surveillance on an annual  basis.     Runell Gess MD FACP,FACC,FAHA, [facility] 06/24/2023 3:04 PM

## 2023-06-24 NOTE — Patient Instructions (Signed)
Medication Instructions:  Your physician recommends that you continue on your current medications as directed. Please refer to the Current Medication list given to you today.  *If you need a refill on your cardiac medications before your next appointment, please call your pharmacy*    Testing/Procedures: Your physician has requested that you have a carotid duplex. This test is an ultrasound of the carotid arteries in your neck. It looks at blood flow through these arteries that supply the brain with blood. Allow one hour for this exam. There are no restrictions or special instructions. This will take place at [address], [address]. **To do in September 2025**    Follow-Up: At [facility], you and your health needs are our priority.  As part of our continuing mission to provide you with exceptional heart care, we have created designated Provider Care Teams.  These Care Teams include your primary Cardiologist (physician) and Advanced Practice Providers (APPs -  Physician Assistants and Nurse Practitioners) who all work together to provide you with the care you need, when you need it.  We recommend signing up for the patient portal called "MyChart".  Sign up information is provided on this After Visit Summary.  MyChart is used to connect with patients for Virtual Visits (Telemedicine).  Patients are able to view lab/test results, encounter notes, upcoming appointments, etc.  Non-urgent messages can be sent to your provider as well.   To learn more about what you can do with MyChart, go to [email or web address].    Your next appointment:   12 month(s)  Provider:   Nanetta Batty, MD    Other Instructions PV only

## 2023-06-24 NOTE — Assessment & Plan Note (Signed)
Mr. Lucas Porter was referred to me by Dr. Royann Shivers for evaluation of left subclavian artery stenosis.  He had bypass surgery in 2010 and multiple PCI's since that time.  His most recent cardiac catheterization performed by Dr. Excell Seltzer 02/09/2020 revealed patent vein to ramus branch, patent LIMA to the LAD which was itself occluded and total native RCA and vein to the RCA.  Patient had Doppler studies performed 06/03/2023 revealing probably moderate left subclavian artery stenosis with by directional vertebral flow.  He really denies left upper extremity claudication or significant angina.  At this point, I recommend conservative treatment with left subclavian noninvasive surveillance on an annual basis.

## 2023-07-02 ENCOUNTER — Ambulatory Visit: Payer: Medicare Other

## 2023-07-03 ENCOUNTER — Other Ambulatory Visit: Payer: Self-pay | Admitting: Cardiovascular Disease

## 2023-07-10 ENCOUNTER — Ambulatory Visit: Payer: Medicare Other | Attending: Cardiovascular Disease | Admitting: *Deleted

## 2023-07-10 DIAGNOSIS — I480 Paroxysmal atrial fibrillation: Secondary | ICD-10-CM | POA: Diagnosis not present

## 2023-07-10 DIAGNOSIS — Z7901 Long term (current) use of anticoagulants: Secondary | ICD-10-CM | POA: Diagnosis not present

## 2023-07-10 LAB — POCT INR: INR: 2 (ref 2.0–3.0)

## 2023-07-10 NOTE — Patient Instructions (Signed)
Description   Today take 1 tablet of warfarin then continue taking 2.5mg  daily (0.5 tablet). Repeat INR in 4 weeks. Call [phone] if any questions; Have surgical center fax to cardiology office. [facility] office;  Eat [employer].

## 2023-08-07 ENCOUNTER — Ambulatory Visit: Payer: Medicare Other | Attending: Cardiovascular Disease | Admitting: *Deleted

## 2023-08-07 DIAGNOSIS — Z7901 Long term (current) use of anticoagulants: Secondary | ICD-10-CM | POA: Diagnosis not present

## 2023-08-07 DIAGNOSIS — I480 Paroxysmal atrial fibrillation: Secondary | ICD-10-CM | POA: Diagnosis not present

## 2023-08-07 LAB — POCT INR: INR: 2.5 (ref 2.0–3.0)

## 2023-08-07 NOTE — Patient Instructions (Addendum)
Description   Continue taking warfarin 2.5mg  daily (0.5 tablet). Repeat INR in 4 weeks. Call [phone] if any questions; Have surgical center fax to cardiology office. [facility] office

## 2023-08-20 ENCOUNTER — Ambulatory Visit (INDEPENDENT_AMBULATORY_CARE_PROVIDER_SITE_OTHER): Payer: Medicare Other

## 2023-08-20 DIAGNOSIS — I495 Sick sinus syndrome: Secondary | ICD-10-CM | POA: Diagnosis not present

## 2023-08-21 LAB — CUP PACEART REMOTE DEVICE CHECK
Battery Remaining Longevity: 113 mo
Battery Voltage: 3.01 V
Brady Statistic AP VP Percent: 0 %
Brady Statistic AP VS Percent: 0 %
Brady Statistic AS VP Percent: 32.06 %
Brady Statistic AS VS Percent: 67.94 %
Brady Statistic RA Percent Paced: 0 %
Brady Statistic RV Percent Paced: 32.06 %
Date Time Interrogation Session: 20241203005030
Implantable Lead Connection Status: 753985
Implantable Lead Connection Status: 753985
Implantable Lead Implant Date: 20120229
Implantable Lead Implant Date: 20120229
Implantable Lead Location: 753859
Implantable Lead Location: 753860
Implantable Pulse Generator Implant Date: 20200605
Lead Channel Impedance Value: 342 Ohm
Lead Channel Impedance Value: 494 Ohm
Lead Channel Impedance Value: 551 Ohm
Lead Channel Impedance Value: 551 Ohm
Lead Channel Pacing Threshold Amplitude: 0.75 V
Lead Channel Pacing Threshold Amplitude: 0.75 V
Lead Channel Pacing Threshold Pulse Width: 0.4 ms
Lead Channel Pacing Threshold Pulse Width: 0.4 ms
Lead Channel Sensing Intrinsic Amplitude: 0.5 mV
Lead Channel Sensing Intrinsic Amplitude: 0.5 mV
Lead Channel Sensing Intrinsic Amplitude: 25.375 mV
Lead Channel Sensing Intrinsic Amplitude: 25.375 mV
Lead Channel Setting Pacing Amplitude: 2.5 V
Lead Channel Setting Pacing Pulse Width: 0.4 ms
Lead Channel Setting Sensing Sensitivity: 2 mV
Zone Setting Status: 755011

## 2023-09-05 ENCOUNTER — Ambulatory Visit: Payer: Medicare Other | Attending: Cardiovascular Disease | Admitting: *Deleted

## 2023-09-05 DIAGNOSIS — Z7901 Long term (current) use of anticoagulants: Secondary | ICD-10-CM

## 2023-09-05 DIAGNOSIS — I480 Paroxysmal atrial fibrillation: Secondary | ICD-10-CM | POA: Diagnosis not present

## 2023-09-05 LAB — POCT INR: INR: 1.5 — AB (ref 2.0–3.0)

## 2023-09-05 NOTE — Patient Instructions (Signed)
Description   Today take 5mg  (1 tablet) of warfarin then continue taking warfarin 2.5mg  daily (0.5 tablet). Repeat INR in 2 weeks. Call [phone] if any questions; Have surgical center fax to cardiology office. [facility] office

## 2023-09-19 ENCOUNTER — Ambulatory Visit: Payer: Medicare Other | Attending: Internal Medicine

## 2023-09-19 DIAGNOSIS — Z7901 Long term (current) use of anticoagulants: Secondary | ICD-10-CM | POA: Diagnosis not present

## 2023-09-19 DIAGNOSIS — I480 Paroxysmal atrial fibrillation: Secondary | ICD-10-CM

## 2023-09-19 LAB — POCT INR: INR: 2.7 (ref 2.0–3.0)

## 2023-09-19 NOTE — Patient Instructions (Signed)
 Description   Continue taking warfarin 2.5mg  daily (0.5 tablet).  Repeat INR in 4 weeks.  Call [phone] if any questions; Have surgical center fax to cardiology office.

## 2023-09-22 ENCOUNTER — Other Ambulatory Visit: Payer: Self-pay | Admitting: Cardiovascular Disease

## 2023-10-04 ENCOUNTER — Other Ambulatory Visit: Payer: Self-pay | Admitting: Cardiovascular Disease

## 2023-10-17 ENCOUNTER — Ambulatory Visit: Payer: Medicare Other | Attending: Cardiology | Admitting: *Deleted

## 2023-10-17 DIAGNOSIS — Z7901 Long term (current) use of anticoagulants: Secondary | ICD-10-CM

## 2023-10-17 DIAGNOSIS — I480 Paroxysmal atrial fibrillation: Secondary | ICD-10-CM

## 2023-10-17 LAB — POCT INR: INR: 2.5 (ref 2.0–3.0)

## 2023-10-17 NOTE — Patient Instructions (Signed)
Description   Continue taking warfarin 2.5mg  daily (0.5 tablet).  Repeat INR in 5 weeks.  Call [phone] if any questions; Have surgical center fax to cardiology office.

## 2023-11-19 ENCOUNTER — Ambulatory Visit (INDEPENDENT_AMBULATORY_CARE_PROVIDER_SITE_OTHER): Payer: Medicare Other

## 2023-11-19 DIAGNOSIS — I495 Sick sinus syndrome: Secondary | ICD-10-CM

## 2023-11-19 DIAGNOSIS — I480 Paroxysmal atrial fibrillation: Secondary | ICD-10-CM | POA: Diagnosis not present

## 2023-11-21 ENCOUNTER — Ambulatory Visit: Payer: Medicare Other | Attending: Cardiology | Admitting: *Deleted

## 2023-11-21 DIAGNOSIS — Z7901 Long term (current) use of anticoagulants: Secondary | ICD-10-CM | POA: Diagnosis not present

## 2023-11-21 DIAGNOSIS — I480 Paroxysmal atrial fibrillation: Secondary | ICD-10-CM | POA: Diagnosis not present

## 2023-11-21 DIAGNOSIS — Z5181 Encounter for therapeutic drug level monitoring: Secondary | ICD-10-CM

## 2023-11-21 LAB — CUP PACEART REMOTE DEVICE CHECK
Battery Remaining Longevity: 109 mo
Battery Voltage: 3.01 V
Brady Statistic AP VP Percent: 0 %
Brady Statistic AP VS Percent: 0 %
Brady Statistic AS VP Percent: 51.75 %
Brady Statistic AS VS Percent: 48.25 %
Brady Statistic RA Percent Paced: 0 %
Brady Statistic RV Percent Paced: 51.75 %
Date Time Interrogation Session: 20250304010321
Implantable Lead Connection Status: 753985
Implantable Lead Connection Status: 753985
Implantable Lead Implant Date: 20120229
Implantable Lead Implant Date: 20120229
Implantable Lead Location: 753859
Implantable Lead Location: 753860
Implantable Pulse Generator Implant Date: 20200605
Lead Channel Impedance Value: 342 Ohm
Lead Channel Impedance Value: 456 Ohm
Lead Channel Impedance Value: 513 Ohm
Lead Channel Impedance Value: 551 Ohm
Lead Channel Pacing Threshold Amplitude: 0.75 V
Lead Channel Pacing Threshold Amplitude: 0.875 V
Lead Channel Pacing Threshold Pulse Width: 0.4 ms
Lead Channel Pacing Threshold Pulse Width: 0.4 ms
Lead Channel Sensing Intrinsic Amplitude: 0.5 mV
Lead Channel Sensing Intrinsic Amplitude: 0.5 mV
Lead Channel Sensing Intrinsic Amplitude: 25.5 mV
Lead Channel Sensing Intrinsic Amplitude: 25.5 mV
Lead Channel Setting Pacing Amplitude: 2.5 V
Lead Channel Setting Pacing Pulse Width: 0.4 ms
Lead Channel Setting Sensing Sensitivity: 2 mV
Zone Setting Status: 755011

## 2023-11-21 LAB — POCT INR: POC INR: 2.9

## 2023-11-21 NOTE — Patient Instructions (Signed)
 Description   Continue taking warfarin 2.5mg  daily (0.5 tablet).  Repeat INR in 6 weeks.  Call [phone] if any questions.

## 2023-11-24 ENCOUNTER — Encounter: Payer: Self-pay | Admitting: Cardiovascular Disease

## 2023-11-27 ENCOUNTER — Other Ambulatory Visit: Payer: Self-pay | Admitting: Cardiovascular Disease

## 2023-12-24 NOTE — Addendum Note (Signed)
 Addended by: Geralyn Flash D on: 12/24/2023 01:17 PM   Modules accepted: Orders

## 2023-12-24 NOTE — Progress Notes (Signed)
 Remote pacemaker transmission.

## 2024-01-02 ENCOUNTER — Ambulatory Visit: Attending: Cardiology

## 2024-01-02 DIAGNOSIS — I480 Paroxysmal atrial fibrillation: Secondary | ICD-10-CM

## 2024-01-02 DIAGNOSIS — Z7901 Long term (current) use of anticoagulants: Secondary | ICD-10-CM | POA: Diagnosis not present

## 2024-01-02 LAB — POCT INR: INR: 2.3 (ref 2.0–3.0)

## 2024-01-02 NOTE — Patient Instructions (Addendum)
 Description   Take 1 tablet today and then continue taking warfarin 2.5mg  daily (0.5 tablet).  Repeat INR in 6 weeks. Call [phone] if any questions.

## 2024-01-16 ENCOUNTER — Emergency Department (HOSPITAL_COMMUNITY)

## 2024-01-16 ENCOUNTER — Encounter (HOSPITAL_COMMUNITY): Payer: Self-pay

## 2024-01-16 ENCOUNTER — Other Ambulatory Visit: Payer: Self-pay

## 2024-01-16 ENCOUNTER — Emergency Department (HOSPITAL_COMMUNITY)
Admission: EM | Admit: 2024-01-16 | Discharge: 2024-01-16 | Disposition: A | Discharge status: Home / Self Care | Attending: Emergency Medicine | Admitting: Emergency Medicine

## 2024-01-16 DIAGNOSIS — W108XXA Fall (on) (from) other stairs and steps, initial encounter: Secondary | ICD-10-CM | POA: Diagnosis not present

## 2024-01-16 DIAGNOSIS — I2510 Atherosclerotic heart disease of native coronary artery without angina pectoris: Secondary | ICD-10-CM | POA: Diagnosis not present

## 2024-01-16 DIAGNOSIS — S0990XA Unspecified injury of head, initial encounter: Secondary | ICD-10-CM | POA: Insufficient documentation

## 2024-01-16 DIAGNOSIS — Z7901 Long term (current) use of anticoagulants: Secondary | ICD-10-CM | POA: Insufficient documentation

## 2024-01-16 DIAGNOSIS — I4891 Unspecified atrial fibrillation: Secondary | ICD-10-CM | POA: Insufficient documentation

## 2024-01-16 DIAGNOSIS — W19XXXA Unspecified fall, initial encounter: Secondary | ICD-10-CM

## 2024-01-16 DIAGNOSIS — I10 Essential (primary) hypertension: Secondary | ICD-10-CM | POA: Diagnosis not present

## 2024-01-16 DIAGNOSIS — Z79899 Other long term (current) drug therapy: Secondary | ICD-10-CM | POA: Diagnosis not present

## 2024-01-16 DIAGNOSIS — M25551 Pain in right hip: Secondary | ICD-10-CM | POA: Insufficient documentation

## 2024-01-16 LAB — APTT: aPTT: >200 s (ref 24–36)

## 2024-01-16 LAB — PROTIME-INR
INR: 4 — ABNORMAL HIGH (ref 0.8–1.2)
Prothrombin Time: 39 s — ABNORMAL HIGH (ref 11.4–15.2)

## 2024-01-16 LAB — CBC
HCT: 35.6 % — ABNORMAL LOW (ref 39.0–52.0)
Hemoglobin: 11.4 g/dL — ABNORMAL LOW (ref 13.0–17.0)
MCH: 28.1 pg (ref 26.0–34.0)
MCHC: 32 g/dL (ref 30.0–36.0)
MCV: 87.7 fL (ref 80.0–100.0)
Platelets: 166 10*3/uL (ref 150–400)
RBC: 4.06 MIL/uL — ABNORMAL LOW (ref 4.22–5.81)
RDW: 14.6 % (ref 11.5–15.5)
WBC: 9.4 10*3/uL (ref 4.0–10.5)
nRBC: 0.2 % (ref 0.0–0.2)

## 2024-01-16 LAB — COMPREHENSIVE METABOLIC PANEL WITH GFR
ALT: 8 U/L (ref 0–44)
AST: 16 U/L (ref 15–41)
Albumin: 3.6 g/dL (ref 3.5–5.0)
Alkaline Phosphatase: 47 U/L (ref 38–126)
Anion gap: 13 (ref 5–15)
BUN: 13 mg/dL (ref 8–23)
CO2: 20 mmol/L — ABNORMAL LOW (ref 22–32)
Calcium: 9.1 mg/dL (ref 8.9–10.3)
Chloride: 102 mmol/L (ref 98–111)
Creatinine, Ser: 1.1 mg/dL (ref 0.61–1.24)
GFR, Estimated: >60 mL/min (ref >60)
Glucose, Bld: 154 mg/dL — ABNORMAL HIGH (ref 70–99)
Potassium: 3.8 mmol/L (ref 3.5–5.1)
Sodium: 135 mmol/L (ref 135–145)
Total Bilirubin: 1.6 mg/dL — ABNORMAL HIGH (ref 0.0–1.2)
Total Protein: 6.7 g/dL (ref 6.5–8.1)

## 2024-01-16 LAB — URINALYSIS, ROUTINE W REFLEX MICROSCOPIC
Bilirubin Urine: NEGATIVE
Glucose, UA: NEGATIVE mg/dL
Hgb urine dipstick: NEGATIVE
Ketones, ur: 5 mg/dL — AB
Leukocytes,Ua: NEGATIVE
Nitrite: NEGATIVE
Protein, ur: 30 mg/dL — AB
Specific Gravity, Urine: 1.02 (ref 1.005–1.030)
pH: 5 (ref 5.0–8.0)

## 2024-01-16 LAB — SAMPLE TO BLOOD BANK

## 2024-01-16 LAB — ETHANOL: Alcohol, Ethyl (B): <15 mg/dL (ref <15)

## 2024-01-16 MED ORDER — ACETAMINOPHEN 500 MG PO TABS
1000.0000 mg | ORAL_TABLET | Freq: Once | ORAL | Status: AC
  Administered 2024-01-16: 1000 mg via ORAL
  Filled 2024-01-16: qty 2

## 2024-01-16 NOTE — ED Notes (Signed)
Patient transported to CT by TRN Amanda 

## 2024-01-16 NOTE — ED Notes (Signed)
 Patient to CT.

## 2024-01-16 NOTE — ED Triage Notes (Signed)
 Pt states he fell twice today, tripped over steps; hit head; no loc; pt on coumadin ; , states hit r forehead; endorse bilateral arm and shoulder pain, states was weed eating the other day and has been sore since then; also c/o r hip pain; wife endorses slurred speech and confusion; LSN yesterday am; denies fevers; no urinary urinary symptoms; no cough; pt answering questions appropriately in triage

## 2024-01-16 NOTE — ED Provider Notes (Signed)
  EMERGENCY DEPARTMENT AT [facility] Provider Note   CSN: [id number] Arrival date & time: 01/16/24  1456     History  Chief Complaint  Patient presents with   Lucas    HURMAN Porter is a 68 y.o. male with PMHx CAD, anxiety, Afib on Warfarin, DDD, GERD, HLD, HTN, who presents to ED concerned for mechanical fall. Patient with hx of frequent falls and goes to PT and taken many vitamins/supplements to try and combat this problem. Patient was getting better with decreased falls, but then started having increased falls this week. Patient also stating that he vomited once this morning but denies any other recent infectious symptoms.  Denies fever, chest pain, dyspnea, cough, nausea, diarrhea, dysuria, hematuria, hematochezia.   HPI     Home Medications Prior to Admission medications   Medication Sig Start Date End Date Taking? Authorizing Provider  acetaminophen  (TYLENOL ) 500 MG tablet Take 1,000 mg by mouth every 6 (six) hours as needed for moderate pain.    [provider]  carbidopa-levodopa (SINEMET IR) 25-100 MG tablet Take 1 tablet three times/day during waking hours 06/26/23   [provider]  clonazePAM  (KLONOPIN ) 0.5 MG tablet Take 1 tablet (0.5 mg total) by mouth 2 (two) times daily as needed for anxiety. 12/27/20   Croitoru, Mihai, MD  cyanocobalamin (VITAMIN B12) 1000 MCG tablet Take 1,000 mcg by mouth daily. 10/25/22   [provider]  dextromethorphan-guaiFENesin (MUCINEX DM) 30-600 MG 12hr tablet Take 1 tablet by mouth 2 (two) times daily as needed (congestion).    [provider]  diazepam  (VALIUM ) 5 MG tablet Take 5 mg by mouth. 05/08/23   [provider]  diltiazem  (DILT-XR) 180 MG 24 hr capsule Take 1 capsule by mouth once daily 10/04/23   Croitoru, Mihai, MD  HYDROcodone -acetaminophen  (NORCO/VICODIN) 5-325 MG tablet Take 1-2 tablets by mouth every 6 (six) hours as needed. 04/16/22   Florencio Hunting, MD   isosorbide  mononitrate (IMDUR ) 60 MG 24 hr tablet TAKE 1 TABLET BY MOUTH TWICE  DAILY 11/27/23   Croitoru, Mihai, MD  lisinopril  (ZESTRIL ) 20 MG tablet Take 1 tablet (20 mg total) by mouth daily. 03/17/21   Croitoru, Mihai, MD  MELATONIN PO Take 1 tablet by mouth at bedtime as needed (sleep).    [provider]  metFORMIN  (GLUCOPHAGE ) 500 MG tablet Take 2 tablets by mouth 2 (two) times daily with a meal. 03/01/22   [provider]  metoprolol  tartrate (LOPRESSOR ) 100 MG tablet Take 100 mg by mouth 2 (two) times daily.    [provider]  nitroGLYCERIN  (NITROSTAT ) 0.4 MG SL tablet Take one tablet as needed for chest pain, can take up to three times 5 min apart after 3 tablets and no relief call 911. 11/30/21   Meng, Hao, PA  pantoprazole  (PROTONIX ) 40 MG tablet TAKE 1 TABLET BY MOUTH DAILY 07/04/23   Croitoru, Mihai, MD  potassium chloride  (KLOR-CON ) 10 MEQ tablet TAKE 1 TABLET BY MOUTH DAILY 09/24/23   Croitoru, Mihai, MD  rosuvastatin  (CRESTOR ) 40 MG tablet TAKE 1 TABLET BY MOUTH ONCE DAILY 11/01/17   Aldine Humphreys, PA  venlafaxine  XR (EFFEXOR -XR) 75 MG 24 hr capsule TAKE ONE CAPSULE BY MOUTH ONCE DAILY TAKE  WITH  150MG   TO  EQUAL  A  TOTAL  OF  225MG   DAILY Patient taking differently: Take 75 mg by mouth at bedtime. 07/12/17   Aldine Humphreys, PA  warfarin (COUMADIN ) 5 MG tablet TAKE 1/2 TO 1 TABLET  BY MOUTH  DAILY AS DIRECTED BY COUMADIN   CLINIC 09/10/22   Croitoru, Karyl Paget, MD      Allergies    Patient has no known allergies.    Review of Systems   Review of Systems  Constitutional:        Mechanical fall    Physical Exam Updated Vital Signs BP 131/77   Pulse 79   Temp 98.6 F (37 C) (Oral)   Resp 16   Ht 5\' 10"  (1.778 m)   Wt 96.2 kg   SpO2 98%   BMI 30.42 kg/m  Physical Exam Vitals and nursing note reviewed.  Constitutional:      General: He is not in acute distress.    Appearance: He is not ill-appearing or toxic-appearing.  HENT:     Head:  Normocephalic and atraumatic.     Mouth/Throat:     Mouth: Mucous membranes are moist.  Eyes:     General: No scleral icterus.       Right eye: No discharge.        Left eye: No discharge.     Conjunctiva/sclera: Conjunctivae normal.  Cardiovascular:     Rate and Rhythm: Normal rate and regular rhythm.     Pulses: Normal pulses.     Heart sounds: Normal heart sounds. No murmur heard. Pulmonary:     Effort: Pulmonary effort is normal. No respiratory distress.     Breath sounds: Normal breath sounds. No wheezing, rhonchi or rales.  Abdominal:     General: Abdomen is flat. Bowel sounds are normal. There is no distension.     Palpations: Abdomen is soft. There is no mass.     Tenderness: There is no abdominal tenderness.  Musculoskeletal:     Right lower leg: No edema.     Left lower leg: No edema.     Comments: Right hip tender to palpation  Skin:    General: Skin is warm and dry.     Findings: No rash.  Neurological:     General: No focal deficit present.     Mental Status: He is alert and oriented to person, place, and time. Mental status is at baseline.     Comments: GCS 15. Speech is goal oriented. No deficits appreciated to CN III-XII; symmetric eyebrow raise, no facial drooping, tongue midline. Patient has equal grip strength bilaterally with 5/5 strength against resistance in all major muscle groups bilaterally. Sensation to light touch intact. Patient moves extremities without ataxia.   Psychiatric:        Mood and Affect: Mood normal.        Behavior: Behavior normal.     ED Results / Procedures / Treatments   Labs (all labs ordered are listed, but only abnormal results are displayed) Labs Reviewed  COMPREHENSIVE METABOLIC PANEL WITH GFR - Abnormal; Notable for the following components:      Result Value   CO2 20 (*)    Glucose, Bld 154 (*)    Total Bilirubin 1.6 (*)    All other components within normal limits  CBC - Abnormal; Notable for the following components:    RBC 4.06 (*)    Hemoglobin 11.4 (*)    HCT 35.6 (*)    All other components within normal limits  URINALYSIS, ROUTINE W REFLEX MICROSCOPIC - Abnormal; Notable for the following components:   Ketones, ur 5 (*)    Protein, ur 30 (*)    Bacteria, UA RARE (*)    All other components  within normal limits  PROTIME-INR - Abnormal; Notable for the following components:   Prothrombin Time 39.0 (*)    INR 4.0 (*)    All other components within normal limits  APTT - Abnormal; Notable for the following components:   aPTT >200 (*)    All other components within normal limits  ETHANOL  SAMPLE TO BLOOD BANK    EKG EKG Interpretation Date/Time:  Thursday Jan 16 2024 15:20:29 EDT Ventricular Rate:  87 PR Interval:    QRS Duration:  106 QT Interval:  369 QTC Calculation: 444 R Axis:   8  Text Interpretation: Atrial fibrillation Nonspecific repol abnormality, diffuse leads No significant change since last tracing Confirmed by Almond Army ([id number]) on 01/16/2024 6:34:34 PM  Radiology CT CERVICAL SPINE WO CONTRAST Result Date: 01/16/2024 CLINICAL DATA:  Marvell Slider, trauma EXAM: CT CERVICAL SPINE WITHOUT CONTRAST TECHNIQUE: Multidetector CT imaging of the cervical spine was performed without intravenous contrast. Multiplanar CT image reconstructions were also generated. RADIATION DOSE REDUCTION: This exam was performed according to the departmental dose-optimization program which includes automated exposure control, adjustment of the mA and/or kV according to patient size and/or use of iterative reconstruction technique. COMPARISON:  None Available. FINDINGS: Alignment: Alignment is anatomic. Skull base and vertebrae: No acute fracture. No primary bone lesion or focal pathologic process. Soft tissues and spinal canal: No prevertebral fluid or swelling. No visible canal hematoma. Disc levels: Moderate C5-6 spondylosis with symmetrical bilateral neural foraminal encroachment. Upper chest: Airway is patent.  Minimal dependent ground-glass consolidation within the right upper lobe may be hypoventilatory. Other: Reconstructed images demonstrate no additional findings. IMPRESSION: 1. No acute cervical spine fracture. 2. Moderate C5-6 spondylosis. 3. Hypoventilatory changes at the right apex. Electronically Signed   By: Bobbye Burrow M.D.   On: 01/16/2024 16:39   CT HEAD WO CONTRAST Result Date: 01/16/2024 CLINICAL DATA:  Marvell Slider, head trauma EXAM: CT HEAD WITHOUT CONTRAST TECHNIQUE: Contiguous axial images were obtained from the base of the skull through the vertex without intravenous contrast. RADIATION DOSE REDUCTION: This exam was performed according to the departmental dose-optimization program which includes automated exposure control, adjustment of the mA and/or kV according to patient size and/or use of iterative reconstruction technique. COMPARISON:  None Available. FINDINGS: Brain: Hypodensities are seen throughout the periventricular white matter and bilateral basal ganglia, most consistent with chronic small vessel ischemic changes. No signs of acute infarct or hemorrhage. Lateral ventricles and remaining midline structures are unremarkable. No acute extra-axial fluid collections. No mass effect. Vascular: No hyperdense vessel or unexpected calcification. Skull: Normal. Negative for fracture or focal lesion. Sinuses/Orbits: No acute finding. Other: None. IMPRESSION: 1. No acute intracranial process. 2. Hypodensities throughout the periventricular white matter basal ganglia, compatible with chronic small vessel ischemic changes. Electronically Signed   By: Bobbye Burrow M.D.   On: 01/16/2024 16:37   DG Pelvis Portable Result Date: 01/16/2024 CLINICAL DATA:  Trauma. EXAM: PORTABLE PELVIS 1-2 VIEWS COMPARISON:  April 25, 2022. FINDINGS: There is no evidence of pelvic fracture or diastasis. No pelvic bone lesions are seen. IMPRESSION: Negative. Electronically Signed   By: Rosalene Colon M.D.   On: 01/16/2024  16:30   DG Hip Port Unilat W or [place] Pelvis 1 View Right Result Date: 01/16/2024 CLINICAL DATA:  Right hip pain after fall. EXAM: DG HIP (WITH OR WITHOUT PELVIS) 1V PORT RIGHT COMPARISON:  None Available. FINDINGS: There is no evidence of hip fracture or dislocation. Mild osteophyte formation of right hip is noted. IMPRESSION: Mild  degenerative joint disease of right hip. No acute abnormality seen. Electronically Signed   By: Rosalene Colon M.D.   On: 01/16/2024 16:29   DG Chest Port 1 View Result Date: 01/16/2024 CLINICAL DATA:  Trauma.  Level 2 trauma.  Fall. EXAM: PORTABLE CHEST 1 VIEW COMPARISON:  02/09/2020 FINDINGS: Postoperative changes in the mediastinum. Cardiac pacemaker. Shallow inspiration. Heart size and pulmonary vascularity are normal for technique. Prominent suggested in the left pulmonary outflow tract, unchanged. Probable linear atelectasis in the lung bases. No consolidation, effusion, or pneumothorax. Degenerative changes in the spine and shoulders. IMPRESSION: Atelectasis in the lung bases. Electronically Signed   By: Boyce Byes M.D.   On: 01/16/2024 16:28    Procedures Procedures    Medications Ordered in ED Medications  acetaminophen  (TYLENOL ) tablet 1,000 mg (1,000 mg Oral Given 01/16/24 1724)    ED Course/ Medical Decision Making/ A&P                                 Medical Decision Making Amount and/or Complexity of Data Reviewed Radiology: ordered.  Risk OTC drugs.   This patient presents to the ED after a fall, this involves an extensive number of treatment options, and is a complaint that carries with it a high risk of complications and morbidity.  The differential diagnosis includes  intracranial hemorrhage, subdural/epidural hematoma, vertebral fracture, spinal cord injury, muscle strain, skull fracture, fracture.   Co morbidities that complicate the patient evaluation  CAD, anxiety, Afib on Warfarin, DDD, GERD, HLD, HTN   Additional history  obtained:  Dr. Sulema Endo PCP   Problem List / ED Course / Critical interventions / Medication management  Patient presented for mechanical fall.  Patient with stable vitals and does not appear to be in distress.  Physical exam with tenderness to palpation of right hip.  Rest of physical/neuroexam reassuring.  Patient afebrile with stable vitals. I Ordered, and personally interpreted labs.  CBC without leukocytosis.  There is mild anemia with hemoglobin 11.4.  EtOH within normal limits.  CMP with mild hyperglycemia 154.  UA not concerning for infection.  aPTT elevated >200; PT/INR elevated at 39/4.0.  Patient understands to hold his Coumadin  dose tonight and call the Coumadin  clinic first thing tomorrow morning for follow-up. The patient was maintained on a cardiac monitor.  I personally viewed and interpreted the cardiac monitored which showed an underlying rhythm of: A-fib Triage provider ordered imaging studies including CT cervical spine/head, pelvis x-ray, chest x-ray. I independently visualized and interpreted imaging which showed no acute process. I agree with the radiologist interpretation Shared results with patient.  Answered all questions.  Wife at bedside stating that she is concerned because patient has been having progressively worsening dementia symptoms this past year and he was grabbing at objects and saying strange things last night. Patient educated on following up with neurologist for dementia.  I also recommended following up with PCP for medical management since patient is on many medicines at this point.  Patient and wife verbalized understanding of plan. Staffed with Dr. Leida Puna who agrees with plan. I have reviewed the patients home medicines and have made adjustments as needed The patient has been appropriately medically screened and/or stabilized in the ED. I have low suspicion for any other emergent medical condition which would require further screening, evaluation or  treatment in the ED or require inpatient management. At time of discharge the patient is hemodynamically stable  and in no acute distress. I have discussed work-up results and diagnosis with patient and answered all questions. Patient is agreeable with discharge plan. We discussed strict return precautions for returning to the emergency department and they verbalized understanding.     Social Determinants of Health:  geriatric           Final Clinical Impression(s) / ED Diagnoses Final diagnoses:  Fall, initial encounter    Rx / DC Orders ED Discharge Orders     None         Don Fritter 01/16/24 1924    Almond Army, MD 01/17/24 1921

## 2024-01-16 NOTE — Discharge Instructions (Signed)
 It was a pleasure caring for you today. Please hold your Coumadin  dose tonight.  Call the Coumadin  clinic in the morning for follow-up.  Please also follow-up with your neurologist if your neurologic symptoms continue to worsen.  Seek emergency care if experiencing any new or worsening symptoms.

## 2024-01-16 NOTE — ED Notes (Signed)
 Geographical information systems officer notified of need for activation of Level 2 trauma, fall on thinners, Coumadin .

## 2024-01-17 ENCOUNTER — Telehealth: Payer: Self-pay | Admitting: Cardiovascular Disease

## 2024-01-17 NOTE — Telephone Encounter (Signed)
 Returned call and spoke with pt's wife, Archibald Beard. Pt's INR was 4.0 yesterday, 01/16/24 in ED. No changes or new medications were prescribed. No signs or symptoms of bleeding. Pt held Warfarin dose yesterday. Advised for pt to resume regular Warfarin dose (1/2 tablet daily) and recheck INR on 02/13/24 in the Coumadin  Clinic on [address]. If pt has more visits to ED or requires hospitalization, coumadin  clinic will need to be made aware. Pt's wife verbalized understanding.

## 2024-01-17 NOTE — Telephone Encounter (Signed)
 Pt c/o medication issue:  1. Name of Medication: warfarin (COUMADIN ) 5 MG tablet   2. How are you currently taking this medication (dosage and times per day)?  TAKE 1/2 TO 1 TABLET BY MOUTH DAILY AS DIRECTED BY COUMADIN  CLINIC     3. Are you having a reaction (difficulty breathing--STAT)? No  4. What is your medication issue? Pt's wife is requesting a callback regarding him being in ED yesterday and after labs were done he was advised to hold off on taking this medication last night and to let the office know. Please advise

## 2024-02-13 ENCOUNTER — Ambulatory Visit: Attending: Cardiovascular Disease | Admitting: *Deleted

## 2024-02-13 DIAGNOSIS — Z7901 Long term (current) use of anticoagulants: Secondary | ICD-10-CM | POA: Diagnosis not present

## 2024-02-13 DIAGNOSIS — I480 Paroxysmal atrial fibrillation: Secondary | ICD-10-CM

## 2024-02-13 DIAGNOSIS — Z5181 Encounter for therapeutic drug level monitoring: Secondary | ICD-10-CM | POA: Diagnosis not present

## 2024-02-13 LAB — POCT INR: POC INR: 3.2

## 2024-02-13 NOTE — Patient Instructions (Signed)
 Description   Hold warfarin today then then continue taking warfarin 2.5mg  daily (0.5 tablet).  Repeat INR in 3 weeks. Call [phone] if any questions.

## 2024-02-18 ENCOUNTER — Ambulatory Visit (INDEPENDENT_AMBULATORY_CARE_PROVIDER_SITE_OTHER)

## 2024-02-18 DIAGNOSIS — I495 Sick sinus syndrome: Secondary | ICD-10-CM | POA: Diagnosis not present

## 2024-02-19 LAB — CUP PACEART REMOTE DEVICE CHECK
Battery Remaining Longevity: 107 mo
Battery Voltage: 3 V
Brady Statistic AP VP Percent: 0 %
Brady Statistic AP VS Percent: 0 %
Brady Statistic AS VP Percent: 57.31 %
Brady Statistic AS VS Percent: 42.69 %
Brady Statistic RA Percent Paced: 0 %
Brady Statistic RV Percent Paced: 57.31 %
Date Time Interrogation Session: 20250603015304
Implantable Lead Connection Status: 753985
Implantable Lead Connection Status: 753985
Implantable Lead Implant Date: 20120229
Implantable Lead Implant Date: 20120229
Implantable Lead Location: 753859
Implantable Lead Location: 753860
Implantable Pulse Generator Implant Date: 20200605
Lead Channel Impedance Value: 342 Ohm
Lead Channel Impedance Value: 494 Ohm
Lead Channel Impedance Value: 551 Ohm
Lead Channel Impedance Value: 551 Ohm
Lead Channel Pacing Threshold Amplitude: 0.75 V
Lead Channel Pacing Threshold Amplitude: 0.875 V
Lead Channel Pacing Threshold Pulse Width: 0.4 ms
Lead Channel Pacing Threshold Pulse Width: 0.4 ms
Lead Channel Sensing Intrinsic Amplitude: 0.5 mV
Lead Channel Sensing Intrinsic Amplitude: 0.5 mV
Lead Channel Sensing Intrinsic Amplitude: 24.875 mV
Lead Channel Sensing Intrinsic Amplitude: 24.875 mV
Lead Channel Setting Pacing Amplitude: 2.5 V
Lead Channel Setting Pacing Pulse Width: 0.4 ms
Lead Channel Setting Sensing Sensitivity: 2 mV
Zone Setting Status: 755011

## 2024-02-21 ENCOUNTER — Ambulatory Visit: Payer: Self-pay | Admitting: Cardiovascular Disease

## 2024-03-05 ENCOUNTER — Ambulatory Visit: Attending: Cardiovascular Disease

## 2024-03-05 ENCOUNTER — Ambulatory Visit

## 2024-03-05 DIAGNOSIS — I480 Paroxysmal atrial fibrillation: Secondary | ICD-10-CM | POA: Diagnosis not present

## 2024-03-05 DIAGNOSIS — Z7901 Long term (current) use of anticoagulants: Secondary | ICD-10-CM

## 2024-03-05 LAB — POCT INR: INR: 2 (ref 2.0–3.0)

## 2024-03-05 NOTE — Patient Instructions (Signed)
 Description   Take 1 tablet today and then continue taking warfarin 2.5mg  daily (0.5 tablet).  Repeat INR in 4 weeks. Call [phone] if any questions.

## 2024-03-05 NOTE — Progress Notes (Signed)
Please see anticoagulation encounter.

## 2024-03-27 ENCOUNTER — Other Ambulatory Visit: Payer: Self-pay | Admitting: Cardiovascular Disease

## 2024-04-02 ENCOUNTER — Ambulatory Visit: Attending: Cardiovascular Disease | Admitting: *Deleted

## 2024-04-02 DIAGNOSIS — Z5181 Encounter for therapeutic drug level monitoring: Secondary | ICD-10-CM

## 2024-04-02 DIAGNOSIS — I480 Paroxysmal atrial fibrillation: Secondary | ICD-10-CM | POA: Diagnosis not present

## 2024-04-02 DIAGNOSIS — Z7901 Long term (current) use of anticoagulants: Secondary | ICD-10-CM | POA: Diagnosis not present

## 2024-04-02 LAB — POCT INR: POC INR: 3.4

## 2024-04-02 NOTE — Patient Instructions (Signed)
 Description   Hold warfarin today then continue taking warfarin 2.5mg  daily (0.5 tablet).  Repeat INR in 3 weeks. Call [phone] if any questions.

## 2024-04-02 NOTE — Progress Notes (Signed)
Please see anticoagulation encounter.

## 2024-04-14 ENCOUNTER — Emergency Department (HOSPITAL_COMMUNITY)
Admission: EM | Admit: 2024-04-14 | Discharge: 2024-04-14 | Disposition: A | Discharge status: Home / Self Care | Attending: Emergency Medicine | Admitting: Emergency Medicine

## 2024-04-14 ENCOUNTER — Other Ambulatory Visit: Payer: Self-pay

## 2024-04-14 ENCOUNTER — Emergency Department (HOSPITAL_COMMUNITY)

## 2024-04-14 ENCOUNTER — Encounter (HOSPITAL_COMMUNITY): Payer: Self-pay

## 2024-04-14 DIAGNOSIS — Z7901 Long term (current) use of anticoagulants: Secondary | ICD-10-CM | POA: Insufficient documentation

## 2024-04-14 DIAGNOSIS — R110 Nausea: Secondary | ICD-10-CM | POA: Insufficient documentation

## 2024-04-14 DIAGNOSIS — R42 Dizziness and giddiness: Secondary | ICD-10-CM | POA: Diagnosis present

## 2024-04-14 DIAGNOSIS — H5500 Unspecified nystagmus: Secondary | ICD-10-CM | POA: Diagnosis not present

## 2024-04-14 LAB — URINALYSIS, ROUTINE W REFLEX MICROSCOPIC
Bacteria, UA: NONE SEEN
Bilirubin Urine: NEGATIVE
Glucose, UA: 50 mg/dL — AB
Hgb urine dipstick: NEGATIVE
Ketones, ur: NEGATIVE mg/dL
Leukocytes,Ua: NEGATIVE
Nitrite: NEGATIVE
Protein, ur: 30 mg/dL — AB
Specific Gravity, Urine: 1.018 (ref 1.005–1.030)
pH: 5 (ref 5.0–8.0)

## 2024-04-14 LAB — COMPREHENSIVE METABOLIC PANEL WITH GFR
ALT: 18 U/L (ref 0–44)
AST: 20 U/L (ref 15–41)
Albumin: 4 g/dL (ref 3.5–5.0)
Alkaline Phosphatase: 66 U/L (ref 38–126)
Anion gap: 10 (ref 5–15)
BUN: 19 mg/dL (ref 8–23)
CO2: 24 mmol/L (ref 22–32)
Calcium: 9 mg/dL (ref 8.9–10.3)
Chloride: 102 mmol/L (ref 98–111)
Creatinine, Ser: 1.16 mg/dL (ref 0.61–1.24)
GFR, Estimated: >60 mL/min (ref >60)
Glucose, Bld: 174 mg/dL — ABNORMAL HIGH (ref 70–99)
Potassium: 3.9 mmol/L (ref 3.5–5.1)
Sodium: 136 mmol/L (ref 135–145)
Total Bilirubin: 0.5 mg/dL (ref 0.0–1.2)
Total Protein: 6.9 g/dL (ref 6.5–8.1)

## 2024-04-14 LAB — CBC
HCT: 40.8 % (ref 39.0–52.0)
Hemoglobin: 13.2 g/dL (ref 13.0–17.0)
MCH: 28.4 pg (ref 26.0–34.0)
MCHC: 32.4 g/dL (ref 30.0–36.0)
MCV: 87.7 fL (ref 80.0–100.0)
Platelets: 187 K/uL (ref 150–400)
RBC: 4.65 MIL/uL (ref 4.22–5.81)
RDW: 14.6 % (ref 11.5–15.5)
WBC: 8 K/uL (ref 4.0–10.5)
nRBC: 0 % (ref 0.0–0.2)

## 2024-04-14 LAB — PROTIME-INR
INR: 1.8 — ABNORMAL HIGH (ref 0.8–1.2)
Prothrombin Time: 21.8 s — ABNORMAL HIGH (ref 11.4–15.2)

## 2024-04-14 LAB — CBG MONITORING, ED: Glucose-Capillary: 158 mg/dL — ABNORMAL HIGH (ref 70–99)

## 2024-04-14 MED ORDER — SODIUM CHLORIDE 0.9 % IV SOLN: INTRAVENOUS | Status: DC

## 2024-04-14 MED ORDER — ONDANSETRON HCL 4 MG/2ML IJ SOLN
4.0000 mg | Freq: Once | INTRAMUSCULAR | Status: AC
  Administered 2024-04-14: 4 mg via INTRAVENOUS
  Filled 2024-04-14: qty 2

## 2024-04-14 MED ORDER — MECLIZINE HCL 25 MG PO TABS
25.0000 mg | ORAL_TABLET | Freq: Once | ORAL | Status: AC
  Administered 2024-04-14: 25 mg via ORAL
  Filled 2024-04-14: qty 1

## 2024-04-14 MED ORDER — MECLIZINE HCL 25 MG PO TABS: 25.0000 mg | ORAL_TABLET | Freq: Three times a day (TID) | ORAL | 0 refills | Status: AC | PRN

## 2024-04-14 NOTE — ED Notes (Signed)
 Patient transported to CT

## 2024-04-14 NOTE — Progress Notes (Signed)
 Remote pacemaker transmission.

## 2024-04-14 NOTE — ED Provider Notes (Signed)
 [place] EMERGENCY DEPARTMENT AT [facility] Provider Note   CSN: [id number] Arrival date & time: 04/14/24  9178     Patient presents with: Dizziness and Emesis   Lucas Porter is a 68 y.o. male.   68 year old male presents with acute onset of dizziness which began this morning when he woke up.  Denies any headache.  He is on Coumadin  for history of A-fib.  No focal neurological deficits.  States that he feels that the room is spinning and is worse if he remains still closes his eyes.  Denied any associated chest pain or shortness of breath.  No palpitations noted.  No abdominal discomfort.  No recent changes to medications.  Has been in his baseline state health for several days.  Denies any recent URI for ear pain.  No prior history of same.       Prior to Admission medications   Medication Sig Start Date End Date Taking? Authorizing Provider  acetaminophen  (TYLENOL ) 500 MG tablet Take 1,000 mg by mouth every 6 (six) hours as needed for moderate pain.    [provider]  carbidopa-levodopa (SINEMET IR) 25-100 MG tablet Take 1 tablet three times/day during waking hours 06/26/23   [provider]  clonazePAM  (KLONOPIN ) 0.5 MG tablet Take 1 tablet (0.5 mg total) by mouth 2 (two) times daily as needed for anxiety. 12/27/20   Croitoru, Mihai, MD  cyanocobalamin (VITAMIN B12) 1000 MCG tablet Take 1,000 mcg by mouth daily. 10/25/22   [provider]  dextromethorphan-guaiFENesin (MUCINEX DM) 30-600 MG 12hr tablet Take 1 tablet by mouth 2 (two) times daily as needed (congestion).    [provider]  diazepam  (VALIUM ) 5 MG tablet Take 5 mg by mouth. 05/08/23   [provider]  diltiazem  (DILT-XR) 180 MG 24 hr capsule Take 1 capsule by mouth once daily 10/04/23   Croitoru, Mihai, MD  HYDROcodone -acetaminophen  (NORCO/VICODIN) 5-325 MG tablet Take 1-2 tablets by mouth every 6 (six) hours as needed. 04/16/22   Renda Glance, MD  isosorbide   mononitrate (IMDUR ) 60 MG 24 hr tablet TAKE 1 TABLET BY MOUTH TWICE  DAILY 11/27/23   Croitoru, Mihai, MD  lisinopril  (ZESTRIL ) 20 MG tablet Take 1 tablet (20 mg total) by mouth daily. 03/17/21   Croitoru, Mihai, MD  MELATONIN PO Take 1 tablet by mouth at bedtime as needed (sleep).    [provider]  metFORMIN  (GLUCOPHAGE ) 500 MG tablet Take 2 tablets by mouth 2 (two) times daily with a meal. 03/01/22   [provider]  metoprolol  tartrate (LOPRESSOR ) 100 MG tablet Take 100 mg by mouth 2 (two) times daily.    [provider]  nitroGLYCERIN  (NITROSTAT ) 0.4 MG SL tablet Take one tablet as needed for chest pain, can take up to three times 5 min apart after 3 tablets and no relief call 911. 11/30/21   Meng, Hao, PA  pantoprazole  (PROTONIX ) 40 MG tablet TAKE 1 TABLET BY MOUTH DAILY 03/30/24   Croitoru, Mihai, MD  potassium chloride  (KLOR-CON ) 10 MEQ tablet TAKE 1 TABLET BY MOUTH DAILY 09/24/23   Croitoru, Mihai, MD  rosuvastatin  (CRESTOR ) 40 MG tablet TAKE 1 TABLET BY MOUTH ONCE DAILY 11/01/17   Juliane Che, PA  venlafaxine  XR (EFFEXOR -XR) 75 MG 24 hr capsule TAKE ONE CAPSULE BY MOUTH ONCE DAILY TAKE  WITH  150MG   TO  EQUAL  A  TOTAL  OF  225MG   DAILY Patient taking differently: Take 75 mg by mouth at bedtime. 07/12/17  Juliane Che, PA  warfarin (COUMADIN ) 5 MG tablet TAKE 1/2 TO 1 TABLET BY MOUTH  DAILY AS DIRECTED BY COUMADIN   CLINIC 09/10/22   Croitoru, Jerel, MD    Allergies: Patient has no known allergies.    Review of Systems  All other systems reviewed and are negative.   Updated Vital Signs BP (!) 194/92 (BP Location: Right Arm)   Pulse 71   Temp (!) 97.5 F (36.4 C) (Oral)   Resp 17   SpO2 96%   Physical Exam Vitals and nursing note reviewed.  Constitutional:      General: He is not in acute distress.    Appearance: Normal appearance. He is well-developed. He is not toxic-appearing.  HENT:     Head: Normocephalic and atraumatic.  Eyes:     General:  Lids are normal.     Conjunctiva/sclera: Conjunctivae normal.     Pupils: Pupils are equal, round, and reactive to light.  Neck:     Thyroid: No thyroid mass.     Trachea: No tracheal deviation.  Cardiovascular:     Rate and Rhythm: Normal rate and regular rhythm.     Heart sounds: Normal heart sounds. No murmur heard.    No gallop.  Pulmonary:     Effort: Pulmonary effort is normal. No respiratory distress.     Breath sounds: Normal breath sounds. No stridor. No decreased breath sounds, wheezing, rhonchi or rales.  Abdominal:     General: There is no distension.     Palpations: Abdomen is soft.     Tenderness: There is no abdominal tenderness. There is no rebound.  Musculoskeletal:        General: No tenderness. Normal range of motion.     Cervical back: Normal range of motion and neck supple.  Skin:    General: Skin is warm and dry.     Findings: No abrasion or rash.  Neurological:     General: No focal deficit present.     Mental Status: He is alert and oriented to person, place, and time. Mental status is at baseline.     GCS: GCS eye subscore is 4. GCS verbal subscore is 5. GCS motor subscore is 6.     Cranial Nerves: Cranial nerves 2-12 are intact. No cranial nerve deficit.     Sensory: No sensory deficit.     Motor: Motor function is intact.     Comments: nystagmus  Psychiatric:        Attention and Perception: Attention normal.        Speech: Speech normal.        Behavior: Behavior normal.     (all labs ordered are listed, but only abnormal results are displayed) Labs Reviewed  CBG MONITORING, ED - Abnormal; Notable for the following components:      Result Value   Glucose-Capillary 158 (*)    All other components within normal limits  COMPREHENSIVE METABOLIC PANEL WITH GFR  CBC  URINALYSIS, ROUTINE W REFLEX MICROSCOPIC  PROTIME-INR    EKG: EKG Interpretation Date/Time:  Tuesday April 14 2024 91:70:75 EDT Ventricular Rate:  79 PR Interval:    QRS  Duration:  109 QT Interval:  358 QTC Calculation: 370 R Axis:   -10  Text Interpretation: Atrial fibrillation Anteroseptal infarct, old Borderline repolarization abnormality No significant change since last tracing Confirmed by Dasie Faden ([id number]) on 04/14/2024 8:40:27 AM  Radiology: No results found.   Procedures   Medications Ordered in the ED  0.9 %  sodium chloride  infusion (has no administration in time range)  ondansetron  (ZOFRAN ) injection 4 mg (has no administration in time range)  meclizine  (ANTIVERT ) tablet 25 mg (has no administration in time range)                                    Medical Decision Making Amount and/or Complexity of Data Reviewed Labs: ordered. Radiology: ordered.  Risk Prescription drug management.   Patient is EKG shows A-fib patient is chronically in A-fib and is on Coumadin  and INR was 1.8.  Head CT performed due to patient's dizziness concern for possible bleed that was negative.  Patient given Zofran  for nausea as well as Antivert  and his dizziness is greatly improved.  He initially had some auras on nystagmus which is also greatly improved.  Suspect that patient has peripheral vertigo.  Low suspicion for central etiology.  Labs here are reassuring.  Will ambulate patient and likely discharge     Final diagnoses:  None    ED Discharge Orders     None          Dasie Faden, MD 04/14/24 1137

## 2024-04-14 NOTE — Addendum Note (Signed)
 Addended by: VICCI SELLER A on: 04/14/2024 03:49 PM   Modules accepted: Orders

## 2024-04-14 NOTE — ED Triage Notes (Signed)
 Pt arrives via EMS from home. PT reports dizziness and nausea that started after he woke up this morning, reports he almost passed out. Pt arrives AxOx4. Pt is actively vomiting at this time. NO focal neurological deficits noted.

## 2024-04-14 NOTE — ED Notes (Signed)
Pt ambulated well without assistance.

## 2024-04-14 NOTE — ED Notes (Signed)
 Pt provided with food tray.

## 2024-04-23 ENCOUNTER — Other Ambulatory Visit: Payer: Self-pay | Admitting: Cardiovascular Disease

## 2024-04-23 ENCOUNTER — Ambulatory Visit: Attending: Cardiovascular Disease | Admitting: *Deleted

## 2024-04-23 DIAGNOSIS — I480 Paroxysmal atrial fibrillation: Secondary | ICD-10-CM

## 2024-04-23 DIAGNOSIS — Z7901 Long term (current) use of anticoagulants: Secondary | ICD-10-CM | POA: Diagnosis not present

## 2024-04-23 DIAGNOSIS — Z5181 Encounter for therapeutic drug level monitoring: Secondary | ICD-10-CM

## 2024-04-23 LAB — POCT INR: POC INR: 2.2

## 2024-04-23 NOTE — Patient Instructions (Signed)
 Description   Continue taking warfarin 2.5mg  daily (0.5 tablet).  Repeat INR in 4 weeks. Call [phone] if any questions.

## 2024-04-23 NOTE — Progress Notes (Signed)
 INR-2.2 Please see anticoagulation encounter

## 2024-05-08 ENCOUNTER — Other Ambulatory Visit: Payer: Self-pay | Admitting: Cardiovascular Disease

## 2024-05-19 ENCOUNTER — Ambulatory Visit (INDEPENDENT_AMBULATORY_CARE_PROVIDER_SITE_OTHER)

## 2024-05-19 DIAGNOSIS — I480 Paroxysmal atrial fibrillation: Secondary | ICD-10-CM | POA: Diagnosis not present

## 2024-05-21 ENCOUNTER — Ambulatory Visit: Attending: Cardiovascular Disease | Admitting: *Deleted

## 2024-05-21 DIAGNOSIS — Z7901 Long term (current) use of anticoagulants: Secondary | ICD-10-CM

## 2024-05-21 DIAGNOSIS — I480 Paroxysmal atrial fibrillation: Secondary | ICD-10-CM

## 2024-05-21 LAB — CUP PACEART REMOTE DEVICE CHECK
Battery Remaining Longevity: 104 mo
Battery Voltage: 3 V
Brady Statistic AP VP Percent: 0 %
Brady Statistic AP VS Percent: 0 %
Brady Statistic AS VP Percent: 62.34 %
Brady Statistic AS VS Percent: 37.66 %
Brady Statistic RA Percent Paced: 0 %
Brady Statistic RV Percent Paced: 62.34 %
Date Time Interrogation Session: 20250902015251
Implantable Lead Connection Status: 753985
Implantable Lead Connection Status: 753985
Implantable Lead Implant Date: 20120229
Implantable Lead Implant Date: 20120229
Implantable Lead Location: 753859
Implantable Lead Location: 753860
Implantable Pulse Generator Implant Date: 20200605
Lead Channel Impedance Value: 342 Ohm
Lead Channel Impedance Value: 437 Ohm
Lead Channel Impedance Value: 494 Ohm
Lead Channel Impedance Value: 551 Ohm
Lead Channel Pacing Threshold Amplitude: 0.75 V
Lead Channel Pacing Threshold Amplitude: 0.875 V
Lead Channel Pacing Threshold Pulse Width: 0.4 ms
Lead Channel Pacing Threshold Pulse Width: 0.4 ms
Lead Channel Sensing Intrinsic Amplitude: 0.5 mV
Lead Channel Sensing Intrinsic Amplitude: 0.5 mV
Lead Channel Sensing Intrinsic Amplitude: 18.25 mV
Lead Channel Sensing Intrinsic Amplitude: 18.25 mV
Lead Channel Setting Pacing Amplitude: 2.5 V
Lead Channel Setting Pacing Pulse Width: 0.4 ms
Lead Channel Setting Sensing Sensitivity: 2 mV
Zone Setting Status: 755011

## 2024-05-21 LAB — POCT INR: INR: 1 — AB (ref 2.0–3.0)

## 2024-05-21 NOTE — Progress Notes (Signed)
 Description   INR-1.0; Today take 1 tablet of warfarin and tomorrow take 1 tablet of warfarin then continue taking warfarin 2.5mg  daily (0.5 tablet).  Repeat INR in 1 week (normally 4 weeks). Call [phone] if any questions.

## 2024-05-21 NOTE — Patient Instructions (Signed)
 Description   INR-1.0; Today take 1 tablet of warfarin and tomorrow take 1 tablet of warfarin then continue taking warfarin 2.5mg  daily (0.5 tablet).  Repeat INR in 1 week (normally 4 weeks). Call [phone] if any questions.

## 2024-05-26 NOTE — Progress Notes (Signed)
 Remote PPM Transmission

## 2024-05-27 ENCOUNTER — Encounter: Payer: Self-pay | Admitting: Cardiovascular Disease

## 2024-05-28 ENCOUNTER — Ambulatory Visit: Attending: Cardiovascular Disease

## 2024-05-28 ENCOUNTER — Other Ambulatory Visit: Payer: Self-pay

## 2024-05-28 DIAGNOSIS — I480 Paroxysmal atrial fibrillation: Secondary | ICD-10-CM

## 2024-05-28 DIAGNOSIS — Z7901 Long term (current) use of anticoagulants: Secondary | ICD-10-CM

## 2024-05-28 LAB — POCT INR: INR: 1.1 — AB (ref 2.0–3.0)

## 2024-05-28 MED ORDER — WARFARIN SODIUM 5 MG PO TABS: 5.0000 mg | ORAL_TABLET | Freq: Every day | ORAL | 0 refills | Status: AC

## 2024-05-28 NOTE — Progress Notes (Signed)
 INR 1.1 Please see anticoagulation encounter Today take 1.5 tablets of warfarin and tomorrow take 1.5 tablets of warfarin then continue taking warfarin 2.5mg  daily (0.5 tablet).  Repeat INR in 1 week (normally 4 weeks). Call [phone] if any questions.

## 2024-05-28 NOTE — Patient Instructions (Signed)
 Today take 1.5 tablets of warfarin and tomorrow take 1.5 tablets of warfarin then continue taking warfarin 2.5mg  daily (0.5 tablet).  Repeat INR in 1 week (normally 4 weeks). Call [phone] if any questions.

## 2024-05-31 ENCOUNTER — Ambulatory Visit: Payer: Self-pay | Admitting: Cardiovascular Disease

## 2024-06-04 ENCOUNTER — Ambulatory Visit: Attending: Cardiovascular Disease

## 2024-06-04 DIAGNOSIS — I480 Paroxysmal atrial fibrillation: Secondary | ICD-10-CM

## 2024-06-04 DIAGNOSIS — Z7901 Long term (current) use of anticoagulants: Secondary | ICD-10-CM | POA: Diagnosis not present

## 2024-06-04 LAB — POCT INR: INR: 2.8 (ref 2.0–3.0)

## 2024-06-04 NOTE — Progress Notes (Signed)
 INR 2.8 Please see anticoagulation encounter  continue taking warfarin 2.5mg  daily (0.5 tablet).  Repeat INR in 4 weeks (normally 4 weeks). Call [phone] if any questions.

## 2024-06-04 NOTE — Patient Instructions (Signed)
 continue taking warfarin 2.5mg  daily (0.5 tablet).  Repeat INR in 4 weeks (normally 4 weeks). Call [phone] if any questions.

## 2024-06-10 ENCOUNTER — Other Ambulatory Visit: Payer: Self-pay | Admitting: Cardiovascular Disease

## 2024-06-19 ENCOUNTER — Ambulatory Visit: Payer: Self-pay | Admitting: Cardiovascular Disease

## 2024-06-19 ENCOUNTER — Ambulatory Visit (HOSPITAL_COMMUNITY)
Admission: RE | Admit: 2024-06-19 | Discharge: 2024-06-19 | Disposition: A | Discharge status: Home / Self Care | Source: Ambulatory Visit | Attending: Cardiovascular Disease | Admitting: Cardiovascular Disease

## 2024-06-19 DIAGNOSIS — I771 Stricture of artery: Secondary | ICD-10-CM | POA: Insufficient documentation

## 2024-07-02 ENCOUNTER — Ambulatory Visit: Attending: Cardiovascular Disease | Admitting: *Deleted

## 2024-07-02 DIAGNOSIS — I480 Paroxysmal atrial fibrillation: Secondary | ICD-10-CM | POA: Diagnosis not present

## 2024-07-02 DIAGNOSIS — Z7901 Long term (current) use of anticoagulants: Secondary | ICD-10-CM | POA: Diagnosis not present

## 2024-07-02 LAB — POCT INR: POC INR: 2.2

## 2024-07-02 NOTE — Progress Notes (Signed)
 Lab Results  Component Value Date   INR 2.2 07/02/2024   INR 2.8 06/04/2024   INR 1.1 (A) 05/28/2024    Description    INR 2.2; continue taking warfarin 2.5mg  daily (0.5 tablet).  Repeat INR in 5 weeks.  Call [phone] if any questions.

## 2024-07-02 NOTE — Patient Instructions (Signed)
 Description    INR 2.2; continue taking warfarin 2.5mg  daily (0.5 tablet).  Repeat INR in 5 weeks.  Call [phone] if any questions.

## 2024-07-10 ENCOUNTER — Other Ambulatory Visit: Payer: Self-pay | Admitting: Cardiovascular Disease

## 2024-07-12 ENCOUNTER — Other Ambulatory Visit: Payer: Self-pay | Admitting: Cardiovascular Disease

## 2024-07-25 ENCOUNTER — Other Ambulatory Visit: Payer: Self-pay | Admitting: Cardiovascular Disease

## 2024-08-06 ENCOUNTER — Ambulatory Visit: Attending: Cardiovascular Disease | Admitting: *Deleted

## 2024-08-06 DIAGNOSIS — Z7901 Long term (current) use of anticoagulants: Secondary | ICD-10-CM

## 2024-08-06 DIAGNOSIS — Z5181 Encounter for therapeutic drug level monitoring: Secondary | ICD-10-CM

## 2024-08-06 DIAGNOSIS — I480 Paroxysmal atrial fibrillation: Secondary | ICD-10-CM

## 2024-08-06 LAB — POCT INR: POC INR: 2.5

## 2024-08-06 NOTE — Progress Notes (Signed)
 Lab Results  Component Value Date   INR 2.5 08/06/2024   INR 2.2 07/02/2024   INR 2.8 06/04/2024    Description    INR 2.5; Continue taking warfarin 2.5mg  daily (0.5 tablet).  Repeat INR in 6 weeks.  Call [phone] if any questions.

## 2024-08-06 NOTE — Patient Instructions (Signed)
 Description    INR 2.5; Continue taking warfarin 2.5mg  daily (0.5 tablet).  Repeat INR in 6 weeks.  Call [phone] if any questions.

## 2024-08-10 ENCOUNTER — Other Ambulatory Visit: Payer: Self-pay | Admitting: Cardiovascular Disease

## 2024-08-18 ENCOUNTER — Ambulatory Visit

## 2024-08-18 DIAGNOSIS — I480 Paroxysmal atrial fibrillation: Secondary | ICD-10-CM

## 2024-08-19 LAB — CUP PACEART REMOTE DEVICE CHECK
Battery Remaining Longevity: 102 mo
Battery Voltage: 3 V
Brady Statistic AP VP Percent: 0 %
Brady Statistic AP VS Percent: 0 %
Brady Statistic AS VP Percent: 64.38 %
Brady Statistic AS VS Percent: 35.62 %
Brady Statistic RA Percent Paced: 0 %
Brady Statistic RV Percent Paced: 64.38 %
Date Time Interrogation Session: 20251202005420
Implantable Lead Connection Status: 753985
Implantable Lead Connection Status: 753985
Implantable Lead Implant Date: 20120229
Implantable Lead Implant Date: 20120229
Implantable Lead Location: 753859
Implantable Lead Location: 753860
Implantable Pulse Generator Implant Date: 20200605
Lead Channel Impedance Value: 342 Ohm
Lead Channel Impedance Value: 456 Ohm
Lead Channel Impedance Value: 513 Ohm
Lead Channel Impedance Value: 551 Ohm
Lead Channel Pacing Threshold Amplitude: 0.75 V
Lead Channel Pacing Threshold Amplitude: 0.75 V
Lead Channel Pacing Threshold Pulse Width: 0.4 ms
Lead Channel Pacing Threshold Pulse Width: 0.4 ms
Lead Channel Sensing Intrinsic Amplitude: 0.5 mV
Lead Channel Sensing Intrinsic Amplitude: 0.5 mV
Lead Channel Sensing Intrinsic Amplitude: 24.375 mV
Lead Channel Sensing Intrinsic Amplitude: 24.375 mV
Lead Channel Setting Pacing Amplitude: 2.5 V
Lead Channel Setting Pacing Pulse Width: 0.4 ms
Lead Channel Setting Sensing Sensitivity: 2 mV
Zone Setting Status: 755011

## 2024-08-24 ENCOUNTER — Other Ambulatory Visit: Payer: Self-pay | Admitting: Cardiovascular Disease

## 2024-08-25 ENCOUNTER — Other Ambulatory Visit: Payer: Self-pay | Admitting: Cardiovascular Disease

## 2024-08-25 NOTE — Progress Notes (Signed)
 Remote PPM Transmission

## 2024-09-07 ENCOUNTER — Other Ambulatory Visit: Payer: Self-pay | Admitting: Cardiovascular Disease

## 2024-09-12 ENCOUNTER — Ambulatory Visit: Payer: Self-pay | Admitting: Cardiovascular Disease

## 2024-09-21 ENCOUNTER — Other Ambulatory Visit: Payer: Self-pay | Admitting: Cardiovascular Disease

## 2024-09-24 ENCOUNTER — Ambulatory Visit

## 2024-10-01 NOTE — Progress Notes (Unsigned)
 "  Cardiology Office Note:    Date:  10/01/2024   ID:  Lucas Porter, DOB Nov 06, 1955, MRN [id number]  PCP:  Juliane Che, PA  Cardiologist:  Jerel Balding, MD { Click to update primary MD,subspecialty MD or APP then REFRESH:1}    Referring MD: Juliane Che, PA   Chief Complaint: follow-up of CAD and atrial fibrillation  History of Present Illness:    Lucas Porter is a 69 y.o. 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 Balding and Dr. Court and presents today for routine follow-up.   Patient has an extensive history of CAD with CABG x4 in 2010 and multiple PCIs as detailed above. Last LHC in 01/2020 showed 3/4 patent grafts with known occluded left radial artery to PDA but patents stents to LCX, OM3, and SVG-D1. Continued medical therapy was recommended. Last Echo in 01/2020 showed LVEF of 55-60% with no regional wall motion abnormalities, low normal RV function, and mild MR. Myoview  in 12/2020 showed fixed defects consistent with artifact but no ischemia.  He has a history of recurrent angina and is on multiple antianginals. He also has a history of permanent atrial fibrillation and tachy-brady syndrome s/p dual chamber PPM.    He was last seen by Dr. Balding in 05/2023 at which he was doing well from a cardiac standpoint. He was dealing with problems with a shuffling gait/ stumbling and there was concern for Parkinson's disease.  There was a new carotid bruit noted on exam. Carotid dopplers were and showed 1-39% stenosis of bilateral ICAs but stenotic left subclavian artery. He was referred to Dr. Court for further evaluation. He was seen by Dr. Court in 06/2023 at which time he denied any left upper extremity claudication or angina. Conservative management was recommended. Repeat carotid dopplers in 06/2024 showed 1-39% stenosis of bilateral ICAs and normal blood flow through bilateral vertebral and subclavian arteries.   Patient presents today for follow-up. ***  CAD Patient has an extensive history of CAD with CABG x4 in 2010 and multiple subsequent PCIs (most recently in 07/2019). Last LHC in 2012  showed 3/4 patent grafts with known occluded left radial artery to PDA but patents stents to LCX, OM3, and SVG-D1. Continue medical therapy was recommended. Myoview  in 2022 showed no ischemia. - *** - Continue antianginals: Lopressor  100mg  twice daily, Diltiazem  180 daily, and Imdur  60mg  twice daily. Diltiazem  was previously decreased due to excessive ventricular pacing and Ranexa  was previously stopped as he had no significant benefit from it.  - No Aspirin  given need for Coumadin . - Continue Crestor  40mg  daily.   Permanent Atrial Fibrillation Tachy-Brady Syndrome s/p PPM Last device check in 08/2024 showed occasional episodes of high ventricular rate due to RVR but mostly rate controlled.  - *** - Continue Lopressor  100mg  twice daily and Diltiazem  180mg  daily.  - Continue chronic anticoagulation with Coumadin .  - Will repeat CBC. ***  Carotid Stenosis Subclavian Artery Stenosis Carotid dopplers in 05/2023 showed a stenotic left subclavian artery. However, most recent carotid  dopplers in 06/2024 showed normal flow in bilatearl subclavian arteries and only mild (1-39%) stenosis of bilateral ICAs.  - No Aspirin  given need for Coumadin .  - Continue Crestor  40mg  daily.   Mild Mitral Regurgitation Noted on Echo in 2021.   - Will repeat Echo for routine monitoring. ***  Hypertension BP *** - Continue current medications: Lopressor  100mg  twice daily, Diltiazem  180mg  daily, Imdur  60mg  twice daily, and Lisinopril  20mg  daily.   Hyperlipidemia Lipid panel in 05/2023: Total Cholesterol 132, Triglycerides 274, HDL 39, LDL 50.  - Continue Crestor  40mg  daily.  - Will repeat repeat lipid panel and CMET. ***  EKGs/Labs/Other Studies Reviewed:    The following studies were reviewed:  Left Cardiac Catheterization 02/09/2020: Dist LM to Ost LAD lesion is 100% stenosed. Ost LM to Dist LM lesion is 10% stenosed. Ost 1st Diag lesion is 100% stenosed. Ost 2nd Mrg lesion is 90% stenosed. Non-stenotic Dist Cx lesion was previously treated. Previously placed 3rd Mrg drug eluting stent is widely patent. Previously placed Ost Cx to Prox Cx drug eluting stent is widely patent. Balloon angioplasty was performed. Ost Ramus lesion is 100% stenosed. LIMA and is large. SVG. Prox Graft lesion is 40% stenosed. SVG and is normal in caliber. Non-stenotic Prox Graft lesion was previously treated. Ost RCA to Prox RCA lesion is 100% stenosed. Origin lesion is 100% stenosed.   1. Severe native vessel CAD with mild in-stent restenosis of the left main/left circumflex stent, continued patency of the stented segment in the OM branch of the circumflex 2. Chronic RCA occlusion with chronic occlusion of the SVG-RCA 3. Continued patency of the SVG-ramus intermedius, SVG-diagonal, and LIMA-LAD 4. Normal LVEDP   Recommend: continued medical therapy. Ok to resume warfarin tonight. Continue clopidogrel  long term  Diagnostic Dominance: Co-dominant  _______________  Echocardiogram 02/10/2020: Impressions:  1. Left ventricular ejection fraction, by estimation, is 55 to 60%. The  left ventricle has normal function. The left ventricle has no regional  wall motion abnormalities. Left ventricular diastolic function could not  be evaluated.    2. Right ventricular systolic function is low normal. The right  ventricular size is normal. There is normal pulmonary artery systolic  pressure. The estimated right ventricular systolic pressure is 22.5 mmHg.   3. Left atrial size was mildly dilated.   4. The mitral valve is degenerative. Mild mitral valve regurgitation. No  evidence of mitral stenosis.   5. The aortic valve is tricuspid. Aortic valve regurgitation is not  visualized. No aortic stenosis is present.   6. The inferior vena cava is normal in size with greater than 50%  respiratory variability, suggesting right atrial pressure of 3 mmHg.  _______________  Myoview  01/10/2021: EKG not interpretable due to ventricular paced rhythm The left ventricular ejection fraction is mildly decreased (45-54%). Nuclear stress EF: 53%. Defect 1: There is a medium defect of mild severity present in the basal inferior, mid inferior and apical inferior location. Defect 2: There is a small defect of mild severity present in the apex location. Findings consistent with prior myocardial infarction. This is a low risk study.   1. Fixed perfusion defect at apex with hypokinesis consistent with infarct 2. Fixed inferior perfusion defect with normal wall motion suggests artifact 3. Low risk study.  No ischemia. _______________  Carotid Dopplers 06/19/2024: Summary:  - Right Carotid: Velocities in the right ICA are consistent with a 1-39%  stenosis. Non-hemodynamically significant plaque <50% noted in the  CCA.  - Left Carotid: Velocities in the  left ICA are consistent with a 1-39%  stenosis.  - Vertebrals: Bilateral vertebral arteries demonstrate antegrade flow.  Subclavians: Normal flow hemodynamics were seen in bilateral subclavian  arteries.    EKG:  EKG *** ordered today. EKG personally reviewed and demonstrates ***.  Recent Labs: 04/14/2024: ALT 18; BUN 19; Creatinine, Ser 1.16; Hemoglobin 13.2; Platelets 187; Potassium 3.9; Sodium  136  Recent Lipid Panel    Component Value Date/Time   CHOL 132 05/30/2023 1224   TRIG 274 (H) 05/30/2023 1224   HDL 39 (L) 05/30/2023 1224   CHOLHDL 3.4 05/30/2023 1224   CHOLHDL 6.6 02/10/2020 0621   VLDL 76 (H) 02/10/2020 0621   LDLCALC 50 05/30/2023 1224    Physical Exam:    Vital Signs: There were no vitals taken for this visit.    Wt Readings from Last 3 Encounters:  01/16/24 212 lb (96.2 kg)  06/24/23 209 lb 12.8 oz (95.2 kg)  05/30/23 211 lb (95.7 kg)     General: 69 y.o. male in no acute distress. HEENT: Normocephalic and atraumatic. Sclera clear.  Neck: Supple. No carotid bruits. No JVD. Heart: *** RRR. Distinct S1 and S2. No murmurs, gallops, or rubs.  Lungs: No increased work of breathing. Clear to ausculation bilaterally. No wheezes, rhonchi, or rales.  Abdomen: Soft, non-distended, and non-tender to palpation.  Extremities: No lower extremity edema.  Radial and distal pedal pulses 2+ and equal bilaterally. Skin: Warm and dry. Neuro: No focal deficits. Psych: Normal affect. Responds appropriately.   Assessment:    No diagnosis found.  Plan:     Disposition: Follow up in ***   Signed, Mikhia Dusek E Keyani Rigdon, PA-C  10/01/2024 2:25 PM    [facility] "

## 2024-10-02 ENCOUNTER — Ambulatory Visit

## 2024-10-07 ENCOUNTER — Ambulatory Visit: Attending: Cardiovascular Disease | Admitting: *Deleted

## 2024-10-07 DIAGNOSIS — I480 Paroxysmal atrial fibrillation: Secondary | ICD-10-CM | POA: Diagnosis not present

## 2024-10-07 DIAGNOSIS — Z7901 Long term (current) use of anticoagulants: Secondary | ICD-10-CM | POA: Diagnosis not present

## 2024-10-07 LAB — POCT INR: INR: 2.3 (ref 2.0–3.0)

## 2024-10-07 NOTE — Progress Notes (Signed)
 Description   INR- 2.3; Continue taking warfarin 2.5mg  daily (0.5 tablet).  Repeat INR in 6 weeks.  Call [phone] if any questions.

## 2024-10-07 NOTE — Patient Instructions (Signed)
 Description   INR- 2.3; Continue taking warfarin 2.5mg  daily (0.5 tablet).  Repeat INR in 6 weeks.  Call [phone] if any questions.

## 2024-10-12 ENCOUNTER — Ambulatory Visit: Admitting: Student

## 2024-10-13 ENCOUNTER — Other Ambulatory Visit: Payer: Self-pay | Admitting: Cardiovascular Disease

## 2024-11-17 ENCOUNTER — Encounter

## 2024-11-18 ENCOUNTER — Ambulatory Visit

## 2025-02-16 ENCOUNTER — Encounter

## 2025-05-18 ENCOUNTER — Encounter

## 2025-08-17 ENCOUNTER — Encounter

## 2025-11-16 ENCOUNTER — Encounter
